# Patient Record
Sex: Female | Born: 1935 | Race: Black or African American | Hispanic: No | Smoking: Former smoker
Health system: Southern US, Community
[De-identification: ages and names within clinical notes are randomized; demographics above are authoritative.]

## PROBLEM LIST (undated history)

## (undated) ENCOUNTER — Emergency Department (HOSPITAL_COMMUNITY): Admission: EM | Payer: Medicare Other | Source: Home / Self Care

## (undated) DIAGNOSIS — M545 Low back pain, unspecified: Secondary | ICD-10-CM

## (undated) DIAGNOSIS — E785 Hyperlipidemia, unspecified: Secondary | ICD-10-CM

## (undated) DIAGNOSIS — K589 Irritable bowel syndrome without diarrhea: Secondary | ICD-10-CM

## (undated) DIAGNOSIS — I2699 Other pulmonary embolism without acute cor pulmonale: Secondary | ICD-10-CM

## (undated) DIAGNOSIS — M1990 Unspecified osteoarthritis, unspecified site: Secondary | ICD-10-CM

## (undated) DIAGNOSIS — M8580 Other specified disorders of bone density and structure, unspecified site: Secondary | ICD-10-CM

## (undated) DIAGNOSIS — I10 Essential (primary) hypertension: Secondary | ICD-10-CM

## (undated) DIAGNOSIS — K219 Gastro-esophageal reflux disease without esophagitis: Secondary | ICD-10-CM

## (undated) DIAGNOSIS — A809 Acute poliomyelitis, unspecified: Secondary | ICD-10-CM

## (undated) HISTORY — DX: Acute poliomyelitis, unspecified: A80.9

## (undated) HISTORY — DX: Gastro-esophageal reflux disease without esophagitis: K21.9

## (undated) HISTORY — PX: JOINT REPLACEMENT: SHX530

## (undated) HISTORY — DX: Hyperlipidemia, unspecified: E78.5

## (undated) HISTORY — DX: Other pulmonary embolism without acute cor pulmonale: I26.99

## (undated) HISTORY — DX: Low back pain, unspecified: M54.50

## (undated) HISTORY — DX: Other specified disorders of bone density and structure, unspecified site: M85.80

## (undated) HISTORY — DX: Unspecified osteoarthritis, unspecified site: M19.90

## (undated) HISTORY — DX: Low back pain: M54.5

## (undated) HISTORY — DX: Essential (primary) hypertension: I10

---
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
## 1983-01-11 HISTORY — PX: OOPHORECTOMY: SHX86

## 1983-01-11 HISTORY — PX: ABDOMINAL HYSTERECTOMY: SHX81

## 1997-04-23 ENCOUNTER — Encounter: Admission: RE | Admit: 1997-04-23 | Discharge: 1997-04-23 | Payer: Self-pay | Admitting: Internal Medicine

## 1998-01-20 ENCOUNTER — Encounter: Admission: RE | Admit: 1998-01-20 | Discharge: 1998-01-20 | Payer: Self-pay | Admitting: Internal Medicine

## 1998-02-20 ENCOUNTER — Ambulatory Visit (HOSPITAL_COMMUNITY): Admission: RE | Admit: 1998-02-20 | Discharge: 1998-02-20 | Payer: Self-pay | Admitting: *Deleted

## 1998-06-10 ENCOUNTER — Encounter: Admission: RE | Admit: 1998-06-10 | Discharge: 1998-06-10 | Payer: Self-pay | Admitting: Internal Medicine

## 1999-01-22 ENCOUNTER — Encounter: Admission: RE | Admit: 1999-01-22 | Discharge: 1999-01-22 | Payer: Self-pay | Admitting: Internal Medicine

## 1999-02-05 ENCOUNTER — Encounter: Admission: RE | Admit: 1999-02-05 | Discharge: 1999-02-05 | Payer: Self-pay | Admitting: Internal Medicine

## 1999-02-12 ENCOUNTER — Encounter: Admission: RE | Admit: 1999-02-12 | Discharge: 1999-02-12 | Payer: Self-pay | Admitting: Internal Medicine

## 1999-02-19 ENCOUNTER — Encounter: Admission: RE | Admit: 1999-02-19 | Discharge: 1999-02-19 | Payer: Self-pay | Admitting: Internal Medicine

## 1999-02-26 ENCOUNTER — Encounter: Admission: RE | Admit: 1999-02-26 | Discharge: 1999-02-26 | Payer: Self-pay | Admitting: Internal Medicine

## 1999-03-18 ENCOUNTER — Encounter: Admission: RE | Admit: 1999-03-18 | Discharge: 1999-03-18 | Payer: Self-pay | Admitting: Internal Medicine

## 1999-03-23 ENCOUNTER — Ambulatory Visit (HOSPITAL_COMMUNITY): Admission: RE | Admit: 1999-03-23 | Discharge: 1999-03-23 | Payer: Self-pay

## 1999-04-02 ENCOUNTER — Ambulatory Visit (HOSPITAL_COMMUNITY): Admission: RE | Admit: 1999-04-02 | Discharge: 1999-04-02 | Payer: Self-pay

## 1999-04-02 ENCOUNTER — Encounter: Payer: Self-pay | Admitting: Internal Medicine

## 1999-06-18 ENCOUNTER — Encounter: Admission: RE | Admit: 1999-06-18 | Discharge: 1999-06-18 | Payer: Self-pay | Admitting: Internal Medicine

## 1999-08-18 ENCOUNTER — Encounter: Admission: RE | Admit: 1999-08-18 | Discharge: 1999-08-18 | Payer: Self-pay | Admitting: Internal Medicine

## 1999-08-19 ENCOUNTER — Ambulatory Visit (HOSPITAL_COMMUNITY): Admission: RE | Admit: 1999-08-19 | Discharge: 1999-08-19 | Payer: Self-pay | Admitting: Internal Medicine

## 1999-08-19 ENCOUNTER — Encounter: Payer: Self-pay | Admitting: Internal Medicine

## 1999-08-31 ENCOUNTER — Encounter: Admission: RE | Admit: 1999-08-31 | Discharge: 1999-08-31 | Payer: Self-pay | Admitting: Hematology and Oncology

## 1999-12-22 ENCOUNTER — Encounter: Admission: RE | Admit: 1999-12-22 | Discharge: 1999-12-22 | Payer: Self-pay | Admitting: Internal Medicine

## 2000-06-07 ENCOUNTER — Encounter: Admission: RE | Admit: 2000-06-07 | Discharge: 2000-06-07 | Payer: Self-pay | Admitting: Internal Medicine

## 2000-06-11 ENCOUNTER — Ambulatory Visit (HOSPITAL_COMMUNITY): Admission: RE | Admit: 2000-06-11 | Discharge: 2000-06-11 | Payer: Self-pay | Admitting: Internal Medicine

## 2000-06-11 ENCOUNTER — Encounter: Payer: Self-pay | Admitting: Internal Medicine

## 2000-06-19 ENCOUNTER — Ambulatory Visit (HOSPITAL_COMMUNITY): Admission: RE | Admit: 2000-06-19 | Discharge: 2000-06-19 | Payer: Self-pay

## 2000-07-31 ENCOUNTER — Encounter: Admission: RE | Admit: 2000-07-31 | Discharge: 2000-07-31 | Payer: Self-pay | Admitting: Internal Medicine

## 2000-11-24 ENCOUNTER — Encounter: Admission: RE | Admit: 2000-11-24 | Discharge: 2000-11-24 | Payer: Self-pay | Admitting: Internal Medicine

## 2001-01-23 ENCOUNTER — Encounter: Admission: RE | Admit: 2001-01-23 | Discharge: 2001-01-23 | Payer: Self-pay

## 2001-04-13 ENCOUNTER — Encounter: Admission: RE | Admit: 2001-04-13 | Discharge: 2001-04-13 | Payer: Self-pay | Admitting: Internal Medicine

## 2001-04-18 ENCOUNTER — Encounter: Admission: RE | Admit: 2001-04-18 | Discharge: 2001-04-18 | Payer: Self-pay

## 2001-05-15 ENCOUNTER — Encounter: Admission: RE | Admit: 2001-05-15 | Discharge: 2001-05-15 | Payer: Self-pay | Admitting: Internal Medicine

## 2001-06-21 ENCOUNTER — Ambulatory Visit (HOSPITAL_COMMUNITY): Admission: RE | Admit: 2001-06-21 | Discharge: 2001-06-21 | Payer: Self-pay | Admitting: Internal Medicine

## 2001-06-27 ENCOUNTER — Encounter: Admission: RE | Admit: 2001-06-27 | Discharge: 2001-06-27 | Payer: Self-pay | Admitting: Internal Medicine

## 2001-07-02 ENCOUNTER — Encounter: Admission: RE | Admit: 2001-07-02 | Discharge: 2001-07-02 | Payer: Self-pay | Admitting: Obstetrics

## 2001-07-02 ENCOUNTER — Encounter: Payer: Self-pay | Admitting: Obstetrics

## 2001-07-11 ENCOUNTER — Encounter: Admission: RE | Admit: 2001-07-11 | Discharge: 2001-07-11 | Payer: Self-pay | Admitting: Internal Medicine

## 2001-07-18 ENCOUNTER — Encounter: Admission: RE | Admit: 2001-07-18 | Discharge: 2001-07-18 | Payer: Self-pay | Admitting: Internal Medicine

## 2001-08-01 ENCOUNTER — Encounter: Admission: RE | Admit: 2001-08-01 | Discharge: 2001-08-01 | Payer: Self-pay | Admitting: Internal Medicine

## 2001-08-08 ENCOUNTER — Encounter: Admission: RE | Admit: 2001-08-08 | Discharge: 2001-08-08 | Payer: Self-pay | Admitting: Internal Medicine

## 2001-08-22 ENCOUNTER — Encounter: Admission: RE | Admit: 2001-08-22 | Discharge: 2001-08-22 | Payer: Self-pay | Admitting: Internal Medicine

## 2001-11-16 ENCOUNTER — Ambulatory Visit (HOSPITAL_COMMUNITY): Admission: RE | Admit: 2001-11-16 | Discharge: 2001-11-16 | Payer: Self-pay | Admitting: Internal Medicine

## 2001-11-16 ENCOUNTER — Encounter: Admission: RE | Admit: 2001-11-16 | Discharge: 2001-11-16 | Payer: Self-pay | Admitting: Internal Medicine

## 2001-11-19 ENCOUNTER — Encounter: Payer: Self-pay | Admitting: Internal Medicine

## 2001-11-19 ENCOUNTER — Ambulatory Visit (HOSPITAL_COMMUNITY): Admission: RE | Admit: 2001-11-19 | Discharge: 2001-11-19 | Payer: Self-pay | Admitting: Internal Medicine

## 2001-12-21 ENCOUNTER — Encounter: Admission: RE | Admit: 2001-12-21 | Discharge: 2001-12-21 | Payer: Self-pay | Admitting: Internal Medicine

## 2002-02-01 ENCOUNTER — Encounter: Admission: RE | Admit: 2002-02-01 | Discharge: 2002-02-01 | Payer: Self-pay | Admitting: Internal Medicine

## 2002-02-27 ENCOUNTER — Ambulatory Visit (HOSPITAL_COMMUNITY): Admission: RE | Admit: 2002-02-27 | Discharge: 2002-02-27 | Payer: Self-pay | Admitting: Gastroenterology

## 2002-03-11 ENCOUNTER — Encounter: Admission: RE | Admit: 2002-03-11 | Discharge: 2002-03-11 | Payer: Self-pay | Admitting: Internal Medicine

## 2002-03-11 ENCOUNTER — Ambulatory Visit (HOSPITAL_COMMUNITY): Admission: RE | Admit: 2002-03-11 | Discharge: 2002-03-11 | Payer: Self-pay | Admitting: Internal Medicine

## 2002-04-04 ENCOUNTER — Encounter: Admission: RE | Admit: 2002-04-04 | Discharge: 2002-04-04 | Payer: Self-pay | Admitting: Internal Medicine

## 2002-05-02 ENCOUNTER — Encounter: Payer: Self-pay | Admitting: Orthopedic Surgery

## 2002-05-07 ENCOUNTER — Inpatient Hospital Stay (HOSPITAL_COMMUNITY): Admission: RE | Admit: 2002-05-07 | Discharge: 2002-05-13 | Payer: Self-pay | Admitting: Orthopedic Surgery

## 2002-05-07 ENCOUNTER — Encounter: Payer: Self-pay | Admitting: Orthopedic Surgery

## 2002-05-13 ENCOUNTER — Encounter: Payer: Self-pay | Admitting: Internal Medicine

## 2002-05-13 ENCOUNTER — Inpatient Hospital Stay (HOSPITAL_COMMUNITY)
Admission: RE | Admit: 2002-05-13 | Discharge: 2002-05-18 | Payer: Self-pay | Admitting: Physical Medicine & Rehabilitation

## 2002-06-10 ENCOUNTER — Encounter: Admission: RE | Admit: 2002-06-10 | Discharge: 2002-06-10 | Payer: Self-pay | Admitting: Internal Medicine

## 2002-10-01 ENCOUNTER — Encounter: Admission: RE | Admit: 2002-10-01 | Discharge: 2002-10-01 | Payer: Self-pay | Admitting: Internal Medicine

## 2002-10-02 ENCOUNTER — Encounter: Admission: RE | Admit: 2002-10-02 | Discharge: 2002-10-02 | Payer: Self-pay | Admitting: Internal Medicine

## 2002-11-01 ENCOUNTER — Encounter: Admission: RE | Admit: 2002-11-01 | Discharge: 2002-11-01 | Payer: Self-pay | Admitting: Internal Medicine

## 2002-11-06 ENCOUNTER — Encounter: Admission: RE | Admit: 2002-11-06 | Discharge: 2002-11-06 | Payer: Self-pay | Admitting: Internal Medicine

## 2003-01-16 ENCOUNTER — Encounter: Admission: RE | Admit: 2003-01-16 | Discharge: 2003-01-16 | Payer: Self-pay | Admitting: Internal Medicine

## 2003-02-17 ENCOUNTER — Encounter: Admission: RE | Admit: 2003-02-17 | Discharge: 2003-02-17 | Payer: Self-pay | Admitting: Internal Medicine

## 2003-03-09 ENCOUNTER — Ambulatory Visit (HOSPITAL_COMMUNITY): Admission: RE | Admit: 2003-03-09 | Discharge: 2003-03-09 | Payer: Self-pay | Admitting: Orthopedic Surgery

## 2003-03-27 ENCOUNTER — Encounter: Admission: RE | Admit: 2003-03-27 | Discharge: 2003-03-27 | Payer: Self-pay | Admitting: Orthopedic Surgery

## 2003-04-11 ENCOUNTER — Encounter: Admission: RE | Admit: 2003-04-11 | Discharge: 2003-04-11 | Payer: Self-pay | Admitting: Orthopedic Surgery

## 2003-04-21 ENCOUNTER — Encounter: Admission: RE | Admit: 2003-04-21 | Discharge: 2003-04-21 | Payer: Self-pay | Admitting: Internal Medicine

## 2003-04-25 ENCOUNTER — Ambulatory Visit (HOSPITAL_COMMUNITY): Admission: RE | Admit: 2003-04-25 | Discharge: 2003-04-25 | Payer: Self-pay | Admitting: Internal Medicine

## 2003-04-30 ENCOUNTER — Encounter: Admission: RE | Admit: 2003-04-30 | Discharge: 2003-04-30 | Payer: Self-pay | Admitting: Orthopedic Surgery

## 2003-06-11 ENCOUNTER — Encounter (INDEPENDENT_AMBULATORY_CARE_PROVIDER_SITE_OTHER): Payer: Self-pay | Admitting: Internal Medicine

## 2003-07-23 ENCOUNTER — Encounter: Admission: RE | Admit: 2003-07-23 | Discharge: 2003-07-23 | Payer: Self-pay | Admitting: Internal Medicine

## 2003-08-06 ENCOUNTER — Encounter: Admission: RE | Admit: 2003-08-06 | Discharge: 2003-08-06 | Payer: Self-pay | Admitting: Internal Medicine

## 2003-10-13 ENCOUNTER — Ambulatory Visit: Payer: Self-pay | Admitting: Internal Medicine

## 2003-10-13 ENCOUNTER — Ambulatory Visit (HOSPITAL_COMMUNITY): Admission: RE | Admit: 2003-10-13 | Discharge: 2003-10-13 | Payer: Self-pay | Admitting: Internal Medicine

## 2003-11-25 ENCOUNTER — Ambulatory Visit: Payer: Self-pay | Admitting: Internal Medicine

## 2004-01-27 ENCOUNTER — Encounter: Admission: RE | Admit: 2004-01-27 | Discharge: 2004-01-27 | Payer: Self-pay | Admitting: Internal Medicine

## 2004-02-04 ENCOUNTER — Ambulatory Visit: Payer: Self-pay | Admitting: Internal Medicine

## 2004-02-10 ENCOUNTER — Encounter: Admission: RE | Admit: 2004-02-10 | Discharge: 2004-02-10 | Payer: Self-pay | Admitting: Orthopedic Surgery

## 2004-02-11 ENCOUNTER — Encounter (INDEPENDENT_AMBULATORY_CARE_PROVIDER_SITE_OTHER): Payer: Self-pay | Admitting: Internal Medicine

## 2004-02-16 ENCOUNTER — Ambulatory Visit: Payer: Self-pay | Admitting: Internal Medicine

## 2004-02-18 ENCOUNTER — Ambulatory Visit: Payer: Self-pay | Admitting: Internal Medicine

## 2004-02-19 ENCOUNTER — Inpatient Hospital Stay (HOSPITAL_COMMUNITY): Admission: EM | Admit: 2004-02-19 | Discharge: 2004-02-23 | Payer: Self-pay | Admitting: Emergency Medicine

## 2004-02-19 ENCOUNTER — Ambulatory Visit: Payer: Self-pay | Admitting: Internal Medicine

## 2004-03-01 ENCOUNTER — Ambulatory Visit: Payer: Self-pay | Admitting: Internal Medicine

## 2004-04-14 ENCOUNTER — Ambulatory Visit: Payer: Self-pay | Admitting: Internal Medicine

## 2004-04-26 ENCOUNTER — Ambulatory Visit (HOSPITAL_COMMUNITY): Admission: RE | Admit: 2004-04-26 | Discharge: 2004-04-26 | Payer: Self-pay | Admitting: Internal Medicine

## 2004-04-26 DIAGNOSIS — M899 Disorder of bone, unspecified: Secondary | ICD-10-CM | POA: Insufficient documentation

## 2004-04-26 DIAGNOSIS — M949 Disorder of cartilage, unspecified: Secondary | ICD-10-CM

## 2004-06-22 ENCOUNTER — Ambulatory Visit: Payer: Self-pay | Admitting: Internal Medicine

## 2004-08-03 ENCOUNTER — Ambulatory Visit: Payer: Self-pay | Admitting: Internal Medicine

## 2004-09-20 ENCOUNTER — Ambulatory Visit: Payer: Self-pay | Admitting: Internal Medicine

## 2004-09-22 ENCOUNTER — Ambulatory Visit: Payer: Self-pay | Admitting: Internal Medicine

## 2004-10-04 ENCOUNTER — Ambulatory Visit: Payer: Self-pay | Admitting: Internal Medicine

## 2005-01-13 ENCOUNTER — Ambulatory Visit: Payer: Self-pay | Admitting: Internal Medicine

## 2005-01-18 ENCOUNTER — Ambulatory Visit: Payer: Self-pay | Admitting: Internal Medicine

## 2005-01-20 ENCOUNTER — Encounter: Admission: RE | Admit: 2005-01-20 | Discharge: 2005-01-20 | Payer: Self-pay | Admitting: Orthopedic Surgery

## 2005-02-04 ENCOUNTER — Encounter: Admission: RE | Admit: 2005-02-04 | Discharge: 2005-02-04 | Payer: Self-pay | Admitting: Orthopedic Surgery

## 2005-02-08 ENCOUNTER — Encounter: Admission: RE | Admit: 2005-02-08 | Discharge: 2005-02-08 | Payer: Self-pay | Admitting: Orthopedic Surgery

## 2005-02-22 ENCOUNTER — Encounter: Admission: RE | Admit: 2005-02-22 | Discharge: 2005-02-22 | Payer: Self-pay | Admitting: Orthopedic Surgery

## 2005-04-29 ENCOUNTER — Ambulatory Visit: Payer: Self-pay | Admitting: Hospitalist

## 2005-05-06 ENCOUNTER — Ambulatory Visit (HOSPITAL_COMMUNITY): Admission: RE | Admit: 2005-05-06 | Discharge: 2005-05-06 | Payer: Self-pay | Admitting: Internal Medicine

## 2005-06-10 ENCOUNTER — Encounter: Admission: RE | Admit: 2005-06-10 | Discharge: 2005-06-10 | Payer: Self-pay | Admitting: Orthopedic Surgery

## 2005-07-27 ENCOUNTER — Ambulatory Visit: Payer: Self-pay | Admitting: Internal Medicine

## 2005-09-21 ENCOUNTER — Ambulatory Visit: Payer: Self-pay | Admitting: Internal Medicine

## 2005-10-04 DIAGNOSIS — Z8719 Personal history of other diseases of the digestive system: Secondary | ICD-10-CM

## 2005-10-26 DIAGNOSIS — M159 Polyosteoarthritis, unspecified: Secondary | ICD-10-CM | POA: Insufficient documentation

## 2005-10-26 DIAGNOSIS — I10 Essential (primary) hypertension: Secondary | ICD-10-CM

## 2005-10-26 DIAGNOSIS — Z96659 Presence of unspecified artificial knee joint: Secondary | ICD-10-CM | POA: Insufficient documentation

## 2005-10-26 DIAGNOSIS — M545 Low back pain, unspecified: Secondary | ICD-10-CM | POA: Insufficient documentation

## 2005-10-26 DIAGNOSIS — Z9079 Acquired absence of other genital organ(s): Secondary | ICD-10-CM | POA: Insufficient documentation

## 2005-10-26 DIAGNOSIS — K219 Gastro-esophageal reflux disease without esophagitis: Secondary | ICD-10-CM

## 2005-11-07 ENCOUNTER — Ambulatory Visit: Payer: Self-pay | Admitting: Internal Medicine

## 2005-11-07 ENCOUNTER — Encounter (INDEPENDENT_AMBULATORY_CARE_PROVIDER_SITE_OTHER): Payer: Self-pay | Admitting: Internal Medicine

## 2005-11-07 LAB — CONVERTED CEMR LAB
HDL: 93 mg/dL (ref >39)
Total CHOL/HDL Ratio: 2
VLDL: 16 mg/dL (ref 0–40)

## 2005-11-11 ENCOUNTER — Ambulatory Visit (HOSPITAL_COMMUNITY): Admission: RE | Admit: 2005-11-11 | Discharge: 2005-11-11 | Payer: Self-pay | Admitting: Internal Medicine

## 2005-12-15 DIAGNOSIS — M5106 Intervertebral disc disorders with myelopathy, lumbar region: Secondary | ICD-10-CM

## 2005-12-15 DIAGNOSIS — Z86718 Personal history of other venous thrombosis and embolism: Secondary | ICD-10-CM | POA: Insufficient documentation

## 2005-12-15 DIAGNOSIS — E785 Hyperlipidemia, unspecified: Secondary | ICD-10-CM | POA: Insufficient documentation

## 2006-02-02 ENCOUNTER — Encounter (INDEPENDENT_AMBULATORY_CARE_PROVIDER_SITE_OTHER): Payer: Self-pay | Admitting: Internal Medicine

## 2006-02-02 ENCOUNTER — Ambulatory Visit: Payer: Self-pay | Admitting: Internal Medicine

## 2006-02-02 DIAGNOSIS — J189 Pneumonia, unspecified organism: Secondary | ICD-10-CM

## 2006-02-02 LAB — CONVERTED CEMR LAB
Bilirubin Urine: NEGATIVE
Ketones, ur: NEGATIVE mg/dL
Protein, ur: NEGATIVE mg/dL

## 2006-02-10 ENCOUNTER — Telehealth: Payer: Self-pay | Admitting: *Deleted

## 2006-02-13 ENCOUNTER — Ambulatory Visit (HOSPITAL_COMMUNITY): Admission: RE | Admit: 2006-02-13 | Discharge: 2006-02-13 | Payer: Self-pay | Admitting: Internal Medicine

## 2006-02-21 ENCOUNTER — Telehealth: Payer: Self-pay | Admitting: *Deleted

## 2006-03-10 ENCOUNTER — Ambulatory Visit: Payer: Self-pay | Admitting: Internal Medicine

## 2006-03-10 ENCOUNTER — Encounter (INDEPENDENT_AMBULATORY_CARE_PROVIDER_SITE_OTHER): Payer: Self-pay | Admitting: Unknown Physician Specialty

## 2006-03-10 DIAGNOSIS — R3915 Urgency of urination: Secondary | ICD-10-CM | POA: Insufficient documentation

## 2006-03-20 ENCOUNTER — Telehealth: Payer: Self-pay | Admitting: *Deleted

## 2006-04-20 ENCOUNTER — Ambulatory Visit: Payer: Self-pay | Admitting: Internal Medicine

## 2006-04-20 DIAGNOSIS — R32 Unspecified urinary incontinence: Secondary | ICD-10-CM | POA: Insufficient documentation

## 2006-05-09 ENCOUNTER — Encounter (INDEPENDENT_AMBULATORY_CARE_PROVIDER_SITE_OTHER): Payer: Self-pay | Admitting: Internal Medicine

## 2006-05-09 ENCOUNTER — Ambulatory Visit (HOSPITAL_COMMUNITY): Admission: RE | Admit: 2006-05-09 | Discharge: 2006-05-09 | Payer: Self-pay | Admitting: Gynecology

## 2006-05-29 ENCOUNTER — Ambulatory Visit: Payer: Self-pay | Admitting: Internal Medicine

## 2006-05-29 ENCOUNTER — Encounter (INDEPENDENT_AMBULATORY_CARE_PROVIDER_SITE_OTHER): Payer: Self-pay | Admitting: Internal Medicine

## 2006-05-29 LAB — CONVERTED CEMR LAB
ALT: 11 units/L (ref 0–35)
AST: 14 units/L (ref 0–37)
Albumin: 4.5 g/dL (ref 3.5–5.2)
BUN: 27 mg/dL — ABNORMAL HIGH (ref 6–23)
Basophils Absolute: 0.1 10*3/uL (ref 0.0–0.1)
CO2: 29 meq/L (ref 19–32)
Calcium: 10.5 mg/dL (ref 8.4–10.5)
Chloride: 105 meq/L (ref 96–112)
Cholesterol: 181 mg/dL (ref 0–200)
Creatinine, Ser: 1.06 mg/dL (ref 0.40–1.20)
Eosinophils Relative: 3 % (ref 0–5)
Glucose, Bld: 89 mg/dL (ref 70–99)
HDL: 88 mg/dL (ref >39)
LDL Cholesterol: 78 mg/dL (ref 0–99)
Lymphocytes Relative: 44 % (ref 12–46)
Lymphs Abs: 2.2 10*3/uL (ref 0.7–3.3)
MCV: 99.8 fL (ref 78.0–100.0)
Monocytes Absolute: 0.5 10*3/uL (ref 0.2–0.7)
Monocytes Relative: 10 % (ref 3–11)
Neutro Abs: 2.1 10*3/uL (ref 1.7–7.7)
Neutrophils Relative %: 42 % — ABNORMAL LOW (ref 43–77)
Potassium: 4.2 meq/L (ref 3.5–5.3)
Total Bilirubin: 0.5 mg/dL (ref 0.3–1.2)
Total CHOL/HDL Ratio: 2.1
Total Protein: 7.6 g/dL (ref 6.0–8.3)
Triglycerides: 75 mg/dL (ref <150)

## 2006-06-02 ENCOUNTER — Ambulatory Visit (HOSPITAL_COMMUNITY): Admission: RE | Admit: 2006-06-02 | Discharge: 2006-06-02 | Payer: Self-pay | Admitting: Internal Medicine

## 2006-06-27 ENCOUNTER — Telehealth: Payer: Self-pay | Admitting: *Deleted

## 2006-06-29 ENCOUNTER — Encounter: Payer: Self-pay | Admitting: Internal Medicine

## 2006-08-09 ENCOUNTER — Telehealth (INDEPENDENT_AMBULATORY_CARE_PROVIDER_SITE_OTHER): Payer: Self-pay | Admitting: *Deleted

## 2006-08-15 ENCOUNTER — Encounter (INDEPENDENT_AMBULATORY_CARE_PROVIDER_SITE_OTHER): Payer: Self-pay | Admitting: *Deleted

## 2006-08-15 ENCOUNTER — Ambulatory Visit: Payer: Self-pay | Admitting: *Deleted

## 2006-08-15 DIAGNOSIS — J309 Allergic rhinitis, unspecified: Secondary | ICD-10-CM | POA: Insufficient documentation

## 2006-08-28 ENCOUNTER — Telehealth: Payer: Self-pay | Admitting: *Deleted

## 2006-08-28 ENCOUNTER — Encounter (INDEPENDENT_AMBULATORY_CARE_PROVIDER_SITE_OTHER): Payer: Self-pay | Admitting: Internal Medicine

## 2006-08-28 ENCOUNTER — Telehealth (INDEPENDENT_AMBULATORY_CARE_PROVIDER_SITE_OTHER): Payer: Self-pay | Admitting: *Deleted

## 2006-09-15 ENCOUNTER — Ambulatory Visit: Payer: Self-pay | Admitting: Internal Medicine

## 2006-10-19 ENCOUNTER — Telehealth: Payer: Self-pay | Admitting: *Deleted

## 2006-11-09 ENCOUNTER — Emergency Department (HOSPITAL_COMMUNITY): Admission: EM | Admit: 2006-11-09 | Discharge: 2006-11-09 | Payer: Self-pay | Admitting: Emergency Medicine

## 2006-11-10 ENCOUNTER — Encounter: Payer: Self-pay | Admitting: Emergency Medicine

## 2006-11-11 ENCOUNTER — Inpatient Hospital Stay (HOSPITAL_COMMUNITY): Admission: EM | Admit: 2006-11-11 | Discharge: 2006-11-17 | Payer: Self-pay | Admitting: Internal Medicine

## 2006-11-11 ENCOUNTER — Ambulatory Visit: Payer: Self-pay | Admitting: Internal Medicine

## 2006-11-15 ENCOUNTER — Encounter (INDEPENDENT_AMBULATORY_CARE_PROVIDER_SITE_OTHER): Payer: Self-pay | Admitting: Gastroenterology

## 2006-11-17 ENCOUNTER — Telehealth (INDEPENDENT_AMBULATORY_CARE_PROVIDER_SITE_OTHER): Payer: Self-pay | Admitting: Internal Medicine

## 2006-11-27 ENCOUNTER — Ambulatory Visit: Payer: Self-pay | Admitting: *Deleted

## 2006-11-27 ENCOUNTER — Encounter (INDEPENDENT_AMBULATORY_CARE_PROVIDER_SITE_OTHER): Payer: Self-pay | Admitting: Internal Medicine

## 2006-11-27 DIAGNOSIS — K5900 Constipation, unspecified: Secondary | ICD-10-CM | POA: Insufficient documentation

## 2006-11-27 DIAGNOSIS — K5909 Other constipation: Secondary | ICD-10-CM

## 2006-12-04 ENCOUNTER — Ambulatory Visit: Payer: Self-pay | Admitting: Hospitalist

## 2006-12-04 ENCOUNTER — Encounter (INDEPENDENT_AMBULATORY_CARE_PROVIDER_SITE_OTHER): Payer: Self-pay | Admitting: *Deleted

## 2006-12-04 DIAGNOSIS — L0291 Cutaneous abscess, unspecified: Secondary | ICD-10-CM

## 2006-12-04 DIAGNOSIS — L0390 Cellulitis, unspecified: Secondary | ICD-10-CM

## 2006-12-04 LAB — CONVERTED CEMR LAB
CO2: 26 meq/L (ref 19–32)
Glucose, Bld: 91 mg/dL (ref 70–99)
Hemoglobin: 12.5 g/dL (ref 12.0–15.0)
MCV: 96.5 fL (ref 78.0–100.0)
Platelets: 324 10*3/uL (ref 150–400)

## 2006-12-13 ENCOUNTER — Emergency Department (HOSPITAL_COMMUNITY): Admission: EM | Admit: 2006-12-13 | Discharge: 2006-12-14 | Payer: Self-pay | Admitting: Emergency Medicine

## 2006-12-20 ENCOUNTER — Ambulatory Visit: Payer: Self-pay | Admitting: Internal Medicine

## 2006-12-27 ENCOUNTER — Encounter: Admission: RE | Admit: 2006-12-27 | Discharge: 2006-12-27 | Payer: Self-pay | Admitting: General Surgery

## 2007-01-08 ENCOUNTER — Telehealth: Payer: Self-pay | Admitting: Internal Medicine

## 2007-01-17 ENCOUNTER — Ambulatory Visit: Payer: Self-pay | Admitting: Internal Medicine

## 2007-01-17 DIAGNOSIS — K6289 Other specified diseases of anus and rectum: Secondary | ICD-10-CM

## 2007-01-17 DIAGNOSIS — K208 Other esophagitis: Secondary | ICD-10-CM

## 2007-01-17 DIAGNOSIS — K5730 Diverticulosis of large intestine without perforation or abscess without bleeding: Secondary | ICD-10-CM | POA: Insufficient documentation

## 2007-02-09 ENCOUNTER — Encounter (INDEPENDENT_AMBULATORY_CARE_PROVIDER_SITE_OTHER): Payer: Self-pay | Admitting: Internal Medicine

## 2007-02-12 ENCOUNTER — Telehealth: Payer: Self-pay | Admitting: Internal Medicine

## 2007-02-13 ENCOUNTER — Encounter (INDEPENDENT_AMBULATORY_CARE_PROVIDER_SITE_OTHER): Payer: Self-pay | Admitting: Internal Medicine

## 2007-02-19 ENCOUNTER — Telehealth (INDEPENDENT_AMBULATORY_CARE_PROVIDER_SITE_OTHER): Payer: Self-pay | Admitting: Internal Medicine

## 2007-03-02 ENCOUNTER — Telehealth: Payer: Self-pay | Admitting: *Deleted

## 2007-03-29 ENCOUNTER — Ambulatory Visit: Payer: Self-pay | Admitting: Internal Medicine

## 2007-03-29 ENCOUNTER — Ambulatory Visit (HOSPITAL_COMMUNITY): Admission: RE | Admit: 2007-03-29 | Discharge: 2007-03-29 | Payer: Self-pay | Admitting: Infectious Diseases

## 2007-03-29 ENCOUNTER — Encounter: Payer: Self-pay | Admitting: Internal Medicine

## 2007-03-29 ENCOUNTER — Encounter: Payer: Self-pay | Admitting: Infectious Diseases

## 2007-03-29 DIAGNOSIS — R1084 Generalized abdominal pain: Secondary | ICD-10-CM

## 2007-03-29 LAB — CONVERTED CEMR LAB
Albumin: 3.9 g/dL (ref 3.5–5.2)
Alkaline Phosphatase: 94 units/L (ref 39–117)
BUN: 13 mg/dL (ref 6–23)
Calcium: 10.5 mg/dL (ref 8.4–10.5)
Creatinine, Ser: 0.89 mg/dL (ref 0.40–1.20)
Lipase: 33 units/L (ref 11–59)
Monocytes Relative: 6 % (ref 3–12)
Total Bilirubin: 0.8 mg/dL (ref 0.3–1.2)
Total Protein: 7 g/dL (ref 6.0–8.3)

## 2007-04-10 ENCOUNTER — Ambulatory Visit: Payer: Self-pay | Admitting: Internal Medicine

## 2007-04-10 ENCOUNTER — Encounter: Payer: Self-pay | Admitting: Internal Medicine

## 2007-04-10 LAB — CONVERTED CEMR LAB: PTH: 100.1 pg/mL — ABNORMAL HIGH (ref 14.0–72.0)

## 2007-04-12 ENCOUNTER — Encounter: Payer: Self-pay | Admitting: *Deleted

## 2007-04-24 ENCOUNTER — Encounter (INDEPENDENT_AMBULATORY_CARE_PROVIDER_SITE_OTHER): Payer: Self-pay | Admitting: Internal Medicine

## 2007-04-30 ENCOUNTER — Ambulatory Visit: Payer: Self-pay | Admitting: Infectious Disease

## 2007-04-30 ENCOUNTER — Telehealth (INDEPENDENT_AMBULATORY_CARE_PROVIDER_SITE_OTHER): Payer: Self-pay | Admitting: Internal Medicine

## 2007-04-30 ENCOUNTER — Encounter: Payer: Self-pay | Admitting: Internal Medicine

## 2007-04-30 LAB — CONVERTED CEMR LAB
Cholesterol: 165 mg/dL (ref 0–200)
Collection Interval-CRCL: 24 hr
Creatinine 24 HR UR: 794 mg/24hr (ref 700–1800)
Creatinine Clearance: 56 mL/min — ABNORMAL LOW (ref 75–115)
Creatinine, Urine: 81.4 mg/dL
HDL: 83 mg/dL (ref >39)
LDL Cholesterol: 66 mg/dL (ref 0–99)
Triglycerides: 81 mg/dL (ref <150)
VLDL: 16 mg/dL (ref 0–40)

## 2007-05-01 ENCOUNTER — Ambulatory Visit (HOSPITAL_COMMUNITY): Admission: RE | Admit: 2007-05-01 | Discharge: 2007-05-01 | Payer: Self-pay | Admitting: Internal Medicine

## 2007-05-01 ENCOUNTER — Encounter (INDEPENDENT_AMBULATORY_CARE_PROVIDER_SITE_OTHER): Payer: Self-pay | Admitting: Internal Medicine

## 2007-05-02 ENCOUNTER — Encounter (INDEPENDENT_AMBULATORY_CARE_PROVIDER_SITE_OTHER): Payer: Self-pay | Admitting: Hospitalist

## 2007-05-10 ENCOUNTER — Ambulatory Visit (HOSPITAL_COMMUNITY): Admission: RE | Admit: 2007-05-10 | Discharge: 2007-05-10 | Payer: Self-pay | Admitting: Internal Medicine

## 2007-05-25 ENCOUNTER — Ambulatory Visit: Payer: Self-pay | Admitting: Infectious Disease

## 2007-06-07 ENCOUNTER — Telehealth (INDEPENDENT_AMBULATORY_CARE_PROVIDER_SITE_OTHER): Payer: Self-pay | Admitting: Internal Medicine

## 2007-06-07 ENCOUNTER — Encounter (INDEPENDENT_AMBULATORY_CARE_PROVIDER_SITE_OTHER): Payer: Self-pay | Admitting: Internal Medicine

## 2007-07-20 ENCOUNTER — Telehealth: Payer: Self-pay | Admitting: Infectious Diseases

## 2007-07-24 ENCOUNTER — Encounter: Payer: Self-pay | Admitting: Infectious Diseases

## 2007-09-07 ENCOUNTER — Telehealth (INDEPENDENT_AMBULATORY_CARE_PROVIDER_SITE_OTHER): Payer: Self-pay | Admitting: Internal Medicine

## 2007-09-12 ENCOUNTER — Encounter (INDEPENDENT_AMBULATORY_CARE_PROVIDER_SITE_OTHER): Payer: Self-pay | Admitting: Internal Medicine

## 2007-09-19 ENCOUNTER — Encounter (INDEPENDENT_AMBULATORY_CARE_PROVIDER_SITE_OTHER): Payer: Self-pay | Admitting: Internal Medicine

## 2007-09-19 ENCOUNTER — Ambulatory Visit: Payer: Self-pay | Admitting: *Deleted

## 2007-10-24 ENCOUNTER — Telehealth (INDEPENDENT_AMBULATORY_CARE_PROVIDER_SITE_OTHER): Payer: Self-pay | Admitting: Internal Medicine

## 2007-10-24 ENCOUNTER — Encounter (INDEPENDENT_AMBULATORY_CARE_PROVIDER_SITE_OTHER): Payer: Self-pay | Admitting: Internal Medicine

## 2007-11-19 ENCOUNTER — Encounter (INDEPENDENT_AMBULATORY_CARE_PROVIDER_SITE_OTHER): Payer: Self-pay | Admitting: Internal Medicine

## 2007-11-23 ENCOUNTER — Encounter (INDEPENDENT_AMBULATORY_CARE_PROVIDER_SITE_OTHER): Payer: Self-pay | Admitting: Internal Medicine

## 2007-11-23 ENCOUNTER — Ambulatory Visit: Payer: Self-pay | Admitting: *Deleted

## 2007-11-23 ENCOUNTER — Ambulatory Visit (HOSPITAL_COMMUNITY): Admission: RE | Admit: 2007-11-23 | Discharge: 2007-11-23 | Payer: Self-pay | Admitting: *Deleted

## 2007-11-23 DIAGNOSIS — M79609 Pain in unspecified limb: Secondary | ICD-10-CM

## 2007-11-23 DIAGNOSIS — R079 Chest pain, unspecified: Secondary | ICD-10-CM

## 2007-11-23 LAB — CONVERTED CEMR LAB
AST: 14 units/L (ref 0–37)
BUN: 18 mg/dL (ref 6–23)
CO2: 23 meq/L (ref 19–32)
Glucose, Bld: 94 mg/dL (ref 70–99)
HCT: 39.6 % (ref 36.0–46.0)
Hemoglobin: 12.9 g/dL (ref 12.0–15.0)
MCV: 99 fL (ref 78.0–100.0)
Potassium: 3.9 meq/L (ref 3.5–5.3)
RBC: 4 M/uL (ref 3.87–5.11)
WBC: 5.2 10*3/uL (ref 4.0–10.5)

## 2007-11-26 ENCOUNTER — Ambulatory Visit (HOSPITAL_COMMUNITY): Admission: RE | Admit: 2007-11-26 | Discharge: 2007-11-26 | Payer: Self-pay | Admitting: Internal Medicine

## 2007-11-26 ENCOUNTER — Encounter (INDEPENDENT_AMBULATORY_CARE_PROVIDER_SITE_OTHER): Payer: Self-pay | Admitting: Internal Medicine

## 2007-11-26 ENCOUNTER — Ambulatory Visit: Payer: Self-pay | Admitting: *Deleted

## 2007-11-29 ENCOUNTER — Encounter (INDEPENDENT_AMBULATORY_CARE_PROVIDER_SITE_OTHER): Payer: Self-pay | Admitting: Internal Medicine

## 2007-11-29 ENCOUNTER — Encounter: Payer: Self-pay | Admitting: Internal Medicine

## 2007-11-29 ENCOUNTER — Ambulatory Visit: Payer: Self-pay

## 2008-01-07 ENCOUNTER — Telehealth: Payer: Self-pay | Admitting: Infectious Diseases

## 2008-01-08 ENCOUNTER — Encounter: Payer: Self-pay | Admitting: Infectious Diseases

## 2008-01-18 ENCOUNTER — Ambulatory Visit: Payer: Self-pay | Admitting: Internal Medicine

## 2008-01-28 ENCOUNTER — Encounter (INDEPENDENT_AMBULATORY_CARE_PROVIDER_SITE_OTHER): Payer: Self-pay | Admitting: Internal Medicine

## 2008-03-20 ENCOUNTER — Ambulatory Visit: Payer: Self-pay | Admitting: Internal Medicine

## 2008-03-20 ENCOUNTER — Encounter (INDEPENDENT_AMBULATORY_CARE_PROVIDER_SITE_OTHER): Payer: Self-pay | Admitting: Internal Medicine

## 2008-03-20 DIAGNOSIS — H353190 Nonexudative age-related macular degeneration, unspecified eye, stage unspecified: Secondary | ICD-10-CM

## 2008-03-20 DIAGNOSIS — H25049 Posterior subcapsular polar age-related cataract, unspecified eye: Secondary | ICD-10-CM

## 2008-03-20 LAB — CONVERTED CEMR LAB: HDL goal, serum: 40 mg/dL

## 2008-03-21 ENCOUNTER — Ambulatory Visit: Payer: Self-pay | Admitting: Internal Medicine

## 2008-03-21 ENCOUNTER — Encounter (INDEPENDENT_AMBULATORY_CARE_PROVIDER_SITE_OTHER): Payer: Self-pay | Admitting: Internal Medicine

## 2008-03-21 LAB — CONVERTED CEMR LAB
ALT: 10 units/L (ref 0–35)
Albumin: 4.1 g/dL (ref 3.5–5.2)
Alkaline Phosphatase: 78 units/L (ref 39–117)
Cholesterol: 179 mg/dL (ref 0–200)
Creatinine, Ser: 0.91 mg/dL (ref 0.40–1.20)
Glucose, Bld: 84 mg/dL (ref 70–99)
HCT: 38.4 % (ref 36.0–46.0)
LDL Cholesterol: 79 mg/dL (ref 0–99)
MCV: 96.2 fL (ref 78.0–100.0)
RBC: 3.99 M/uL (ref 3.87–5.11)
Total Bilirubin: 0.4 mg/dL (ref 0.3–1.2)
Triglycerides: 65 mg/dL (ref <150)
VLDL: 13 mg/dL (ref 0–40)

## 2008-05-02 ENCOUNTER — Telehealth (INDEPENDENT_AMBULATORY_CARE_PROVIDER_SITE_OTHER): Payer: Self-pay | Admitting: Internal Medicine

## 2008-05-05 ENCOUNTER — Encounter (INDEPENDENT_AMBULATORY_CARE_PROVIDER_SITE_OTHER): Payer: Self-pay | Admitting: Internal Medicine

## 2008-05-08 ENCOUNTER — Ambulatory Visit (HOSPITAL_COMMUNITY): Admission: RE | Admit: 2008-05-08 | Discharge: 2008-05-08 | Payer: Self-pay | Admitting: Internal Medicine

## 2008-06-17 ENCOUNTER — Ambulatory Visit: Payer: Self-pay | Admitting: Internal Medicine

## 2008-06-17 DIAGNOSIS — F4321 Adjustment disorder with depressed mood: Secondary | ICD-10-CM | POA: Insufficient documentation

## 2008-07-28 ENCOUNTER — Ambulatory Visit: Payer: Self-pay | Admitting: Infectious Diseases

## 2008-07-28 ENCOUNTER — Encounter: Payer: Self-pay | Admitting: Internal Medicine

## 2008-07-28 LAB — CONVERTED CEMR LAB
AST: 16 units/L (ref 0–37)
CO2: 29 meq/L (ref 19–32)
Chloride: 104 meq/L (ref 96–112)
Cholesterol: 185 mg/dL (ref 0–200)
Glucose, Bld: 83 mg/dL (ref 70–99)
HDL: 87 mg/dL (ref >39)
Sodium: 142 meq/L (ref 135–145)
Total Bilirubin: 0.4 mg/dL (ref 0.3–1.2)
Total Protein: 6.8 g/dL (ref 6.0–8.3)
VLDL: 20 mg/dL (ref 0–40)

## 2008-08-19 ENCOUNTER — Telehealth: Payer: Self-pay | Admitting: Internal Medicine

## 2008-09-18 ENCOUNTER — Telehealth: Payer: Self-pay | Admitting: Internal Medicine

## 2008-09-18 ENCOUNTER — Encounter: Payer: Self-pay | Admitting: Internal Medicine

## 2008-10-27 ENCOUNTER — Telehealth: Payer: Self-pay | Admitting: Internal Medicine

## 2008-10-27 ENCOUNTER — Encounter: Payer: Self-pay | Admitting: Internal Medicine

## 2008-11-04 ENCOUNTER — Ambulatory Visit: Payer: Self-pay | Admitting: Internal Medicine

## 2008-11-27 IMAGING — CR DG CHEST 1V PORT
1 series · 1 of 1 positions shown · non-contrast
Comparison: 02/20/2004

CLINICAL DATA: Line placement

PORTABLE CHEST - 1 VIEW:

[view not recorded]
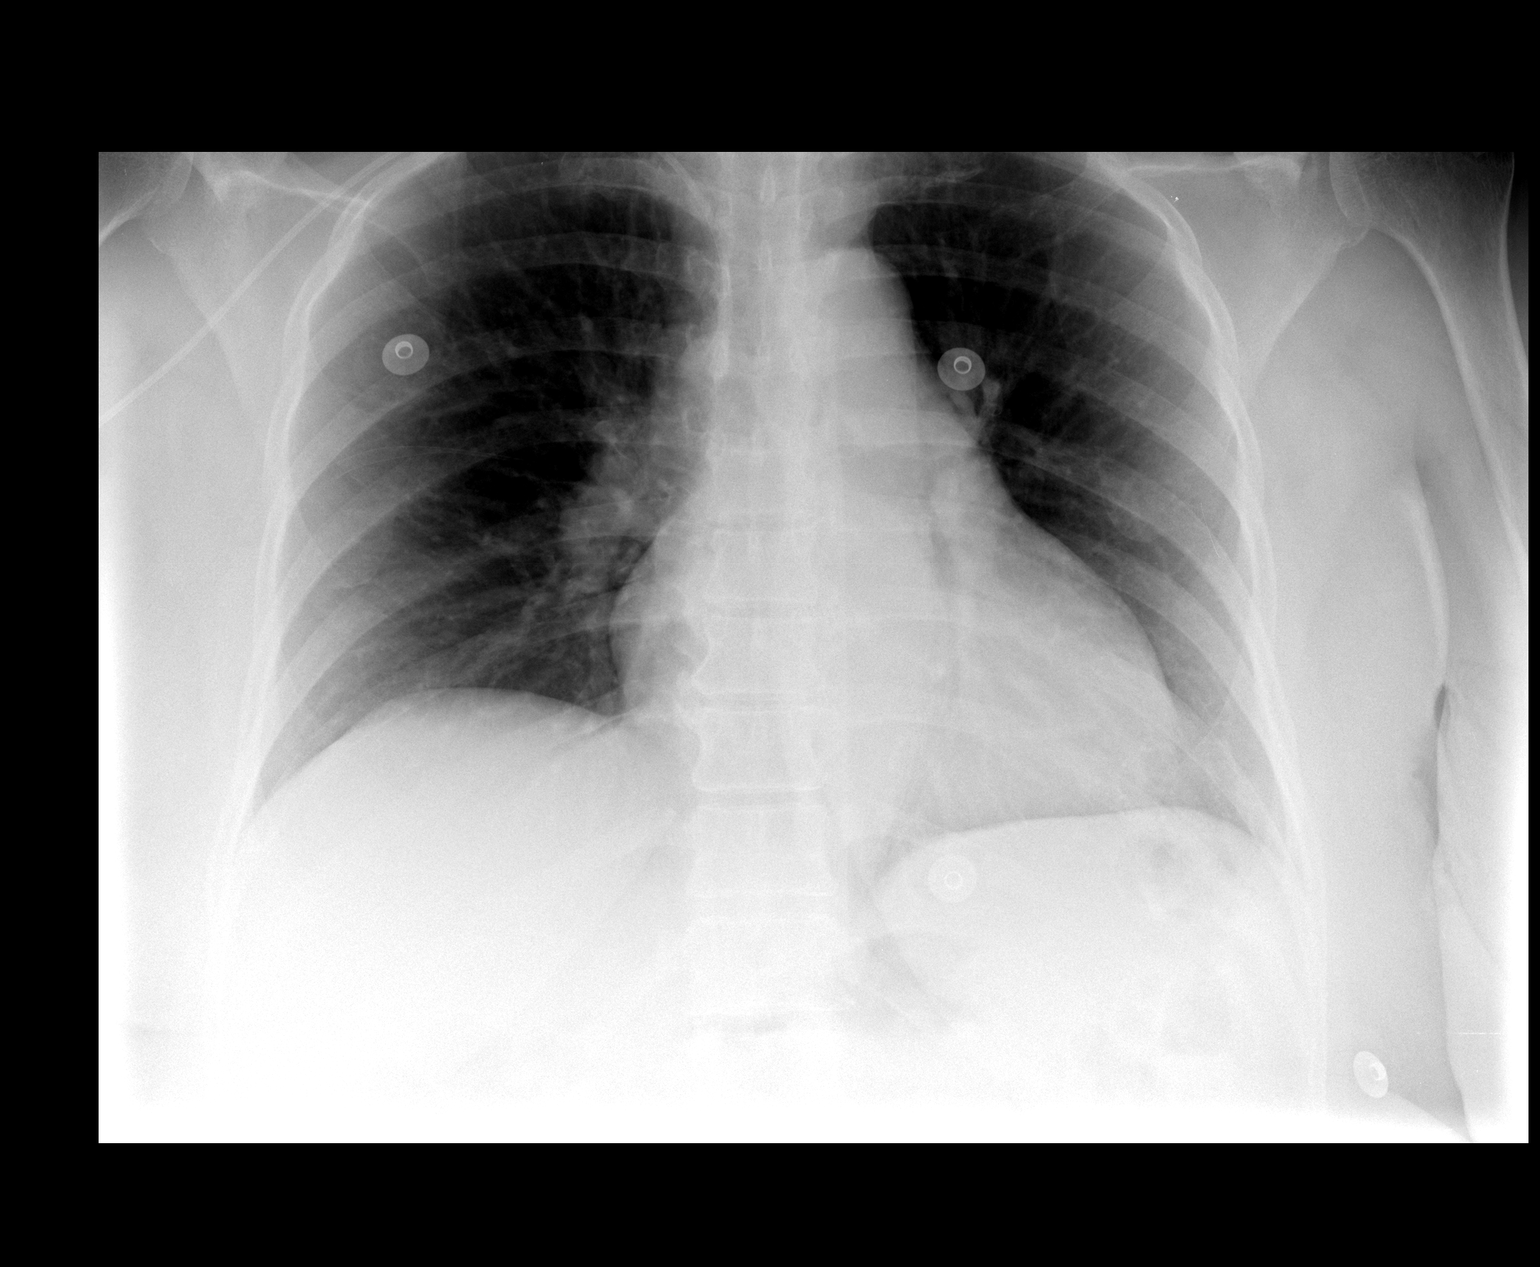

[1 of 1 positions shown; findings below may reference images not displayed]

FINDINGS: Right PICC line is in place. The tip is in the mid SVC.

Decreasing bibasilar opacities since prior study. Heart is borderline in size.
IMPRESSION: Right PICC line tip mid SVC.

## 2008-12-08 ENCOUNTER — Telehealth: Payer: Self-pay | Admitting: *Deleted

## 2008-12-09 ENCOUNTER — Encounter: Payer: Self-pay | Admitting: Internal Medicine

## 2009-02-02 ENCOUNTER — Telehealth: Payer: Self-pay | Admitting: Internal Medicine

## 2009-02-06 ENCOUNTER — Encounter: Payer: Self-pay | Admitting: Internal Medicine

## 2009-02-09 ENCOUNTER — Ambulatory Visit: Payer: Self-pay | Admitting: Internal Medicine

## 2009-03-10 ENCOUNTER — Telehealth: Payer: Self-pay | Admitting: Internal Medicine

## 2009-03-11 ENCOUNTER — Encounter: Payer: Self-pay | Admitting: Infectious Diseases

## 2009-04-14 ENCOUNTER — Telehealth: Payer: Self-pay | Admitting: Internal Medicine

## 2009-04-17 ENCOUNTER — Encounter: Payer: Self-pay | Admitting: Internal Medicine

## 2009-05-11 ENCOUNTER — Ambulatory Visit: Payer: Self-pay | Admitting: Internal Medicine

## 2009-05-11 LAB — CONVERTED CEMR LAB
HDL: 91 mg/dL (ref >39)
LDL Cholesterol: 66 mg/dL (ref 0–99)
VLDL: 27 mg/dL (ref 0–40)

## 2009-05-22 ENCOUNTER — Encounter: Payer: Self-pay | Admitting: Internal Medicine

## 2009-05-22 ENCOUNTER — Telehealth: Payer: Self-pay | Admitting: Internal Medicine

## 2009-05-25 ENCOUNTER — Telehealth: Payer: Self-pay | Admitting: *Deleted

## 2009-05-26 ENCOUNTER — Encounter: Payer: Self-pay | Admitting: Internal Medicine

## 2009-05-26 ENCOUNTER — Telehealth: Payer: Self-pay | Admitting: *Deleted

## 2009-05-29 ENCOUNTER — Ambulatory Visit (HOSPITAL_COMMUNITY): Admission: RE | Admit: 2009-05-29 | Discharge: 2009-05-29 | Payer: Self-pay | Admitting: Family Medicine

## 2009-05-29 LAB — HM MAMMOGRAPHY: HM Mammogram: NEGATIVE

## 2009-06-25 ENCOUNTER — Encounter: Payer: Self-pay | Admitting: Internal Medicine

## 2009-07-02 ENCOUNTER — Emergency Department (HOSPITAL_COMMUNITY): Admission: EM | Admit: 2009-07-02 | Discharge: 2009-07-02 | Payer: Self-pay | Admitting: Emergency Medicine

## 2009-07-06 ENCOUNTER — Ambulatory Visit: Payer: Self-pay | Admitting: Internal Medicine

## 2009-07-06 DIAGNOSIS — K589 Irritable bowel syndrome without diarrhea: Secondary | ICD-10-CM

## 2009-07-10 ENCOUNTER — Ambulatory Visit (HOSPITAL_COMMUNITY): Admission: RE | Admit: 2009-07-10 | Discharge: 2009-07-10 | Payer: Self-pay | Admitting: Internal Medicine

## 2009-07-10 ENCOUNTER — Ambulatory Visit: Payer: Self-pay | Admitting: Internal Medicine

## 2009-07-10 ENCOUNTER — Telehealth: Payer: Self-pay | Admitting: *Deleted

## 2009-07-10 DIAGNOSIS — R197 Diarrhea, unspecified: Secondary | ICD-10-CM

## 2009-07-10 DIAGNOSIS — E876 Hypokalemia: Secondary | ICD-10-CM

## 2009-07-10 LAB — CONVERTED CEMR LAB
ALT: 19 units/L (ref 0–35)
AST: 20 units/L (ref 0–37)
Albumin: 3.8 g/dL (ref 3.5–5.2)
Alkaline Phosphatase: 82 units/L (ref 39–117)
BUN: 14 mg/dL (ref 6–23)
Creatinine, Ser: 0.98 mg/dL (ref 0.40–1.20)
Glucose, Bld: 92 mg/dL (ref 70–99)
Lipase: 47 units/L (ref 11–59)
Total Bilirubin: 0.6 mg/dL (ref 0.3–1.2)

## 2009-07-11 ENCOUNTER — Telehealth: Payer: Self-pay | Admitting: Internal Medicine

## 2009-07-20 ENCOUNTER — Ambulatory Visit: Payer: Self-pay | Admitting: Internal Medicine

## 2009-07-20 LAB — CONVERTED CEMR LAB
CO2: 23 meq/L (ref 19–32)
Calcium: 10 mg/dL (ref 8.4–10.5)
Sodium: 143 meq/L (ref 135–145)

## 2009-08-11 ENCOUNTER — Encounter (INDEPENDENT_AMBULATORY_CARE_PROVIDER_SITE_OTHER): Payer: Self-pay | Admitting: *Deleted

## 2009-08-13 ENCOUNTER — Ambulatory Visit: Payer: Self-pay | Admitting: Internal Medicine

## 2009-08-27 ENCOUNTER — Ambulatory Visit: Payer: Self-pay | Admitting: Internal Medicine

## 2009-08-27 LAB — HM COLONOSCOPY: HM Colonoscopy: 2004

## 2009-09-10 ENCOUNTER — Telehealth: Payer: Self-pay | Admitting: Internal Medicine

## 2009-10-01 ENCOUNTER — Telehealth: Payer: Self-pay | Admitting: Internal Medicine

## 2009-10-07 ENCOUNTER — Telehealth: Payer: Self-pay | Admitting: Internal Medicine

## 2009-10-09 ENCOUNTER — Ambulatory Visit: Payer: Self-pay | Admitting: Internal Medicine

## 2009-10-11 ENCOUNTER — Emergency Department (HOSPITAL_COMMUNITY): Admission: EM | Admit: 2009-10-11 | Discharge: 2009-10-11 | Payer: Self-pay | Admitting: Emergency Medicine

## 2009-10-16 ENCOUNTER — Telehealth: Payer: Self-pay | Admitting: Internal Medicine

## 2009-10-16 ENCOUNTER — Emergency Department (HOSPITAL_COMMUNITY): Admission: EM | Admit: 2009-10-16 | Discharge: 2009-10-16 | Payer: Self-pay | Admitting: Emergency Medicine

## 2009-10-20 ENCOUNTER — Ambulatory Visit: Payer: Self-pay | Admitting: Internal Medicine

## 2009-10-20 ENCOUNTER — Encounter (HOSPITAL_BASED_OUTPATIENT_CLINIC_OR_DEPARTMENT_OTHER)
Admission: RE | Admit: 2009-10-20 | Discharge: 2010-01-18 | Payer: Self-pay | Source: Home / Self Care | Attending: General Surgery | Admitting: General Surgery

## 2009-10-20 DIAGNOSIS — T25029A Burn of unspecified degree of unspecified foot, initial encounter: Secondary | ICD-10-CM | POA: Insufficient documentation

## 2009-11-02 ENCOUNTER — Encounter: Payer: Self-pay | Admitting: Internal Medicine

## 2009-11-16 ENCOUNTER — Encounter: Payer: Self-pay | Admitting: Internal Medicine

## 2009-11-25 ENCOUNTER — Ambulatory Visit (HOSPITAL_COMMUNITY): Admission: RE | Admit: 2009-11-25 | Discharge: 2009-11-25 | Payer: Self-pay | Admitting: Internal Medicine

## 2009-11-25 ENCOUNTER — Ambulatory Visit: Payer: Self-pay | Admitting: Internal Medicine

## 2009-11-25 ENCOUNTER — Telehealth: Payer: Self-pay | Admitting: Internal Medicine

## 2009-11-26 ENCOUNTER — Encounter: Payer: Self-pay | Admitting: Internal Medicine

## 2009-11-26 LAB — CONVERTED CEMR LAB
AST: 24 units/L (ref 0–37)
BUN: 12 mg/dL (ref 6–23)
Basophils Absolute: 0 10*3/uL (ref 0.0–0.1)
Basophils Relative: 0 % (ref 0–1)
Bilirubin Urine: NEGATIVE
CO2: 24 meq/L (ref 19–32)
Casts: NONE SEEN /lpf
Chloride: 104 meq/L (ref 96–112)
Ketones, ur: NEGATIVE mg/dL
Lipase: 13 units/L (ref 0–75)
MCV: 95.9 fL (ref >=78.0)
Monocytes Absolute: 0.5 10*3/uL (ref 0.1–1.0)
Monocytes Relative: 8 % (ref 3–12)
Neutro Abs: 3.1 10*3/uL (ref 1.7–7.7)
Neutrophils Relative %: 47 % (ref 43–77)
Platelets: 255 10*3/uL (ref 150–400)
RBC: 4.13 M/uL (ref 3.87–5.11)
RDW: 13 % (ref 11.5–15.5)
Specific Gravity, Urine: 1.009 (ref 1.005–1.0)
Total Protein: 7.3 g/dL (ref 6.0–8.3)
WBC: 6.6 10*3/uL (ref 4.0–10.5)

## 2009-12-09 ENCOUNTER — Telehealth: Payer: Self-pay | Admitting: Internal Medicine

## 2009-12-10 ENCOUNTER — Encounter: Payer: Self-pay | Admitting: Internal Medicine

## 2010-01-31 ENCOUNTER — Encounter: Payer: Self-pay | Admitting: Internal Medicine

## 2010-02-09 NOTE — Progress Notes (Signed)
Summary: Arthritis pain  Phone Note Call from Patient   Caller: Patient Call For: Blondell Reveal MD Summary of Call: Call from pt would like to know if she can take Advil for her Arthritis pain.  RTC to pt says that she is having the pain in her legs from her Arthritis.  Has had before. Does not want to take the Morphine that she had been on before.  Wants to know if she can try some OTC Advil for her  pain.  Pt said that she has occassional swelling in her leg when she is up on them for long periods.  Pt said that she has not noteds any reddness or heat in her legs.  Pt said when asked that she has tried using heat.  It helps sometimes but would like to get something that she can take so that she can do her walking without pain. Initial call taken by: Angelina Ok RN,  September 10, 2009 3:23 PM  Follow-up for Phone Call        She can take Ibuprofen 400 mg up to three times a day for her pain. She can combine it with acetomeniphin. She can try heat and or ice.    Creat was 1.0. No h/o GI bleed per chart. No CAD. On PPI.  Follow-up by: Blanch Media MD,  September 10, 2009 4:29 PM  Additional Follow-up for Phone Call Additional follow up Details #1::        RTC to pt informed he that she can take 400 mg of Ibuprofen 3 times a day. Pt was told thT she could use Acetomenophen as well.   Pt was also told that she could apply ice and heat.  Pt voiced an understanding of the plan. Additional Follow-up by: Angelina Ok RN,  September 10, 2009 4:52 PM

## 2010-02-09 NOTE — Assessment & Plan Note (Signed)
Summary: EST-2 WEEK RECHECK/CH   Vital Signs:  Patient profile:   77 year old female Height:      64 inches (162.56 cm) Weight:      191.6 pounds (87.09 kg) BMI:     33.01 Temp:     98.8 degrees F (37.11 degrees C) oral Pulse rate:   72 / minute BP sitting:   142 / 84  (left arm) Cuff size:   regular  Vitals Entered By: Theotis Barrio NT II (July 20, 2009 4:07 PM) CC: FOLLOW UP VISIT / STILL HAVING SOME ABD DISCOMFORT / REQUEST ENDO /  Is Patient Diabetic? No Pain Assessment Patient in pain? yes     Location: abdomen Intensity:       4 Type: burning Onset of pain  SINCE LAST VISIT Nutritional Status BMI of > 30 = obese  Have you ever been in a relationship where you felt threatened, hurt or afraid?No   Does patient need assistance? Functional Status Self care Ambulation Normal Comments FOLLOW UP VISIT / STILL HAVING SOME BURNING IN ABD SINCE LAST OFFICE VISIT   Primary Care Provider:  Blondell Reveal MD  CC:  FOLLOW UP VISIT / STILL HAVING SOME ABD DISCOMFORT / REQUEST ENDO / .  History of Present Illness: 77 year old lady with PMH as mentioned in the EMR comes to the office for a follow up visit. Patient was seen in the [facility] [place] on 07/10/2009 for abdominal pain, nausea, diarrhea and was thought to be secondary to IBS. Patient had full work up including lipase, AXR, CBC, CMP and apparently everything was normal except for a low potassium of 3.3. Patient was given a prescription for KCL and hyosciamine..  1. Patient reports that her AP is "better" and "not bad" and reports that she occasionally gets bad. Patient reports that she hasn't had any nausea, vomiting or diarrhea. Patient reports that she has been doing good ever since she was started on the hyosciamine. She denies any other complaints.  2. Patient is requesting to have colonoscopy.  She denies any other problems.  Preventive Screening-Counseling & Management  Alcohol-Tobacco     Smoking Status: quit     Year Quit:  30 years ago  Caffeine-Diet-Exercise     Does Patient Exercise: yes     Type of exercise: WALKING     Times/week: 2  Problems Prior to Update: 1)  Hypokalemia  (ICD-276.8) 2)  Diarrhea  (ICD-787.91) 3)  Irritable Bowel Syndrome  (ICD-564.1) 4)  Mourning  (ICD-309.0) 5)  Posterior Subcapsular Polar Senile Cataract  (ICD-366.14) 6)  Nonexudative Senile Macular Degeneration Retina  (ICD-362.51) 7)  Chest Pain, Dull  (ICD-786.50) 8)  Leg Pain, Chronic, Left  (ICD-729.5) 9)  Accidental Fall  (ICD-E888.9) 10)  [place] Hypercalcemia  (ICD-275.42) 11)  Abdominal Pain, Generalized  (ICD-789.07) 12)  Rectal Pain  (ICD-569.42) 13)  Diverticulosis, Colon  (ICD-562.10) 14)  Erosive Esophagitis  (ICD-530.19) 15)  Abscess, Skin  (ICD-682.9) 16)  Constipation, Chronic  (ICD-564.09) 17)  Rhinitis, Allergic Nos  (ICD-477.9) 18)  Urinary Incontinence  (ICD-788.30) 19)  Hx of Symptom, Urgency, Urination  (ICD-788.63) 20)  Screening For Mlig Neop, Breast, Nos  (ICD-V76.10) 21)  Pneumonia  (ICD-486) 22)  Family History of Colon Ca 1st Degree Relative <60  (ICD-V16.0) 23)  Knee Replacement, Right, Hx of  (ICD-V43.65) 24)  Hyperlipidemia  (ICD-272.4) 25)  Helicobacter Pylori Gastritis, Hx of  (ICD-V12.79) 26)  Disorder, Lumbar Disc W/myelopathy  (ICD-722.73) 27)  Pulmonary Embolism, Hx of  (  ICD-V12.51) 28)  Oophorectomy, Hx of  (ICD-V45.77) 29)  Hysterectomy, Hx of  (ICD-V45.77) 30)  Osteopenia  (ICD-733.90) 31)  Knee Replacement, Left, Hx of  (ICD-V43.65) 32)  Osteoarthritis, Multiple Joints  (ICD-715.89) 33)  Low Back Pain  (ICD-724.2) 34)  Hypertension  (ICD-401.9) 35)  Gerd  (ICD-530.81)  Medications Prior to Update: 1)  Aspirin 81 Mg Chew (Aspirin) .... Take 1 Tablet By Mouth Once A Day 2)  Hydrochlorothiazide 25 Mg Tabs (Hydrochlorothiazide) .... Take 1 Tablet By Mouth Once A Day 3)  Calcium 1500 Mg Tabs (Calcium Carbonate) .... Take 1 Tablet By Mouth Once A Day 4)  Vitamin D 400 Unit  Tabs (Vitamin D) .... Take 2 Tabs By Mouth Once Daily 5)  Lisinopril 40 Mg Tabs (Lisinopril) .... Take 1 Tablet By Mouth Once A Day 6)  Zocor 20 Mg Tabs (Simvastatin) .... Take 1 Tablet By Mouth Once A Day 7)  Nexium 40 Mg Cpdr (Esomeprazole Magnesium) .... Take 1 Capsule By Mouth Once A Day 8)  Detrol La 4 Mg Cp24 (Tolterodine Tartrate) .... Take 1 Tablet By Mouth Once A Day 9)  Centrum Silver  Tabs (Multiple Vitamins-Minerals) .... Take 1 Tablet By Mouth Once A Day For Vitamins 10)  Dicyclomine Hcl 10 Mg  Caps (Dicyclomine Hcl) .... Take For Abdominal Pain Up To Four Times Daily. 11)  Glucosamine-Chondroitin 1500-1200 Mg/72ml Liqd (Glucosamine-Chondroitin) .... Take 1 Tablet By Mouth Two Times A Day 12)  Morphine Sulfate Cr 30 Mg Xr12h-Tab (Morphine Sulfate) .... Take 1 Tablet By Mouth Two Times A Day 13)  Citalopram Hydrobromide 20 Mg Tabs (Citalopram Hydrobromide) .... Take 1 Tablet By Mouth Once A Day 14)  Promethazine Hcl 25 Mg Tabs (Promethazine Hcl) .Marland Kitchen.. 1 Tab Every 4 Hrs As Needed For Nausea/vomiting 15)  Hyoscyamine Sulfate 0.125 Mg Subl (Hyoscyamine Sulfate) .... One To Two Pills Under Your Tongue Up To 4 Times A Day A Needed 16)  Potassium Chloride Crys Cr 20 Meq Cr-Tabs (Potassium Chloride Crys Cr) .... One A Day  Current Medications (verified): 1)  Aspirin 81 Mg Chew (Aspirin) .... Take 1 Tablet By Mouth Once A Day 2)  Hydrochlorothiazide 25 Mg Tabs (Hydrochlorothiazide) .... Take 1 Tablet By Mouth Once A Day 3)  Calcium 1500 Mg Tabs (Calcium Carbonate) .... Take 1 Tablet By Mouth Once A Day 4)  Vitamin D 400 Unit Tabs (Vitamin D) .... Take 2 Tabs By Mouth Once Daily 5)  Lisinopril 40 Mg Tabs (Lisinopril) .... Take 1 Tablet By Mouth Once A Day 6)  Zocor 20 Mg Tabs (Simvastatin) .... Take 1 Tablet By Mouth Once A Day 7)  Nexium 40 Mg Cpdr (Esomeprazole Magnesium) .... Take 1 Capsule By Mouth Once A Day 8)  Detrol La 4 Mg Cp24 (Tolterodine Tartrate) .... Take 1 Tablet By Mouth Once  A Day 9)  Centrum Silver  Tabs (Multiple Vitamins-Minerals) .... Take 1 Tablet By Mouth Once A Day For Vitamins 10)  Dicyclomine Hcl 10 Mg  Caps (Dicyclomine Hcl) .... Take For Abdominal Pain Up To Four Times Daily. 11)  Glucosamine-Chondroitin 1500-1200 Mg/53ml Liqd (Glucosamine-Chondroitin) .... Take 1 Tablet By Mouth Two Times A Day 12)  Morphine Sulfate Cr 30 Mg Xr12h-Tab (Morphine Sulfate) .... Take 1 Tablet By Mouth Two Times A Day 13)  Citalopram Hydrobromide 20 Mg Tabs (Citalopram Hydrobromide) .... Take 1 Tablet By Mouth Once A Day 14)  Promethazine Hcl 25 Mg Tabs (Promethazine Hcl) .Marland Kitchen.. 1 Tab Every 4 Hrs As Needed For Nausea/vomiting 15)  Hyoscyamine Sulfate 0.125 Mg Subl (Hyoscyamine Sulfate) .... One To Two Pills Under Your Tongue Up To 4 Times A Day A Needed 16)  Potassium Chloride Crys Cr 20 Meq Cr-Tabs (Potassium Chloride Crys Cr) .... 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 GERD-Papulous Gastropathy EGD Oct 07 Hypertension Low back pain-intermittent radiculopathy Osteoarthritis Pulmonary embolism, hx of-2/2 Knee Replacement Osteopenia (T Score -1.1 R Femur, -0.4 L Spine) Hyperlipidemia Polio in 1942 Urinary Incontinence  Past Surgical History: Reviewed history from 10/26/2005 and no changes required. Total knee replacement-Left 1995, Right 2004 Hysterectomy-1985 Oophorectomy-1985  Family History: Reviewed history from 02/02/2006 and no changes required. Family History  of Colon CA 1st degree relative <60-Daughter died  Social History: Reviewed history from 03/20/2008 and no changes required. 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   Head:  normocephalic and atraumatic.   Neck:  supple and full ROM.   Lungs:  normal respiratory effort, no intercostal retractions, no accessory muscle use, and normal breath sounds.   Heart:  normal rate, regular rhythm, no murmur, and no gallop.   Abdomen:  soft, non-tender, normal bowel sounds, and no distention.   Msk:  normal ROM and no joint tenderness.   Pulses:  R radial normal.   Extremities:  No clubbing, cyanosis, edema, or deformity noted with normal full range of motion of all joints.   Neurologic:  alert & oriented X3, cranial nerves II-XII intact, strength normal in all extremities, and sensation intact to light touch.     Impression & Recommendations:  Problem # 1:  HYPOKALEMIA (ICD-276.8)  Recommended to stop it for now. Will check BMP.  Orders: T-Basic Metabolic Panel [phone])  Problem # 2:  IRRITABLE BOWEL SYNDROME (ICD-564.1) No active issues. Recommended to take Hyosciamine as needed.  Problem # 3:  FAMILY HISTORY OF COLON CA 1ST DEGREE RELATIVE <60 (ICD-V16.0)  Will refer her to screening colonoscopy.  Orders: Gastroenterology Referral (GI)  Problem # 4:  HYPERTENSION (ICD-401.9) Well controlled. Continue current medications.  Her updated medication list for this problem includes:    Hydrochlorothiazide 25 Mg Tabs (Hydrochlorothiazide) .Marland Kitchen... Take 1 tablet by mouth once a day    Lisinopril 40 Mg Tabs (Lisinopril) .Marland Kitchen... Take 1 tablet by mouth once a day  BP today: 142/84 Prior BP: 132/76 (07/10/2009)  Prior 10 Yr Risk Heart Disease: 3 % (03/20/2008)  Labs Reviewed: K+:  3.3 (07/10/2009) Creat: : 0.98 (07/10/2009)   Chol: 184 (05/11/2009)   HDL: 91 (05/11/2009)   LDL: 66 (05/11/2009)   TG: 136 (05/11/2009)  Complete Medication List: 1)  Aspirin 81 Mg Chew (Aspirin) .... Take 1 tablet by mouth once a day 2)  Hydrochlorothiazide 25 Mg Tabs (Hydrochlorothiazide) .... Take 1 tablet by mouth once a day 3)  Calcium 1500 Mg Tabs (Calcium carbonate) .... Take 1 tablet by mouth once a day 4)  Vitamin  D 400 Unit Tabs (Vitamin d) .... Take 2 tabs by mouth once daily 5)  Lisinopril 40 Mg Tabs (Lisinopril) .... Take 1 tablet by mouth once a day 6)  Zocor 20 Mg Tabs (Simvastatin) .... Take 1 tablet by mouth once a day 7)  Nexium 40 Mg Cpdr (Esomeprazole magnesium) .... Take 1 capsule by mouth once a day 8)  Detrol La 4 Mg Cp24 (Tolterodine tartrate) .... Take 1 tablet by mouth once a day 9)  Centrum Silver Tabs (Multiple vitamins-minerals) .... Take 1 tablet by mouth once a day for vitamins 10)  Dicyclomine Hcl 10 Mg Caps (Dicyclomine hcl) .... Take for abdominal pain up to four times daily. 11)  Glucosamine-chondroitin 1500-1200 Mg/32ml Liqd (Glucosamine-chondroitin) .... Take 1 tablet by mouth two times a day 12)  Morphine Sulfate Cr 30 Mg Xr12h-tab (Morphine sulfate) .... Take 1 tablet by mouth two times a day 13)  Citalopram Hydrobromide 20 Mg Tabs (Citalopram hydrobromide) .... Take 1 tablet by mouth once a day 14)  Promethazine Hcl 25 Mg Tabs (Promethazine hcl) .Marland Kitchen.. 1 tab every 4 hrs as needed for nausea/vomiting 15)  Hyoscyamine Sulfate 0.125 Mg Subl (Hyoscyamine sulfate) .... One to two pills under your tongue up to 4 times a day a needed 16)  Potassium Chloride Crys Cr 20 Meq Cr-tabs (Potassium chloride crys cr) .... One a day  Patient Instructions: 1)  Please schedule a follow-up appointment in 3 months. 2)  Do not take Potassium Tablets until we ask you to take. Process Orders Check Orders Results:     [facility]: Check successful Tests Sent  for requisitioning (July 20, 2009 4:39 PM):     07/20/2009: [facility] -- T-Basic Metabolic Panel [phone] (signed)    Prevention & Chronic Care Immunizations   Influenza vaccine: already had '08  (12/04/2006)   Influenza vaccine deferral: Not indicated  (11/04/2008)   Influenza vaccine due: 09/10/2008    Tetanus booster: Not documented   Td booster deferral: Refused  (07/20/2009)    Pneumococcal vaccine: Not documented   Pneumococcal vaccine deferral: Refused  (07/20/2009)    H. zoster vaccine: Not documented   H. zoster vaccine deferral: Refused  (07/20/2009)  Colorectal Screening   Hemoccult: Not documented   Hemoccult action/deferral: Not indicated  (07/20/2009)    Colonoscopy: Normal  (02/27/2002)   Colonoscopy action/deferral: GI referral  (07/20/2009)  Other Screening   Pap smear: Not documented   Pap smear action/deferral: Not indicated S/P hysterectomy  (11/04/2008)    Mammogram: ASSESSMENT: Negative - BI-RADS 1^MM DIGITAL SCREENING  (05/29/2009)   Mammogram action/deferral: Not indicated  (07/20/2009)    DXA bone density scan: Lumbar Spine:  T Score > -1.0 Spine.  Hip Total: T Score -2.5 to -1.0 Hip.   Osteopenia    (05/01/2007)   DXA bone density action/deferral: Deferred  (07/06/2009)   DXA scan due: 05/2009    Smoking status: quit  (07/20/2009)  Lipids   Total Cholesterol: 184  (05/11/2009)   Lipid panel action/deferral: Lipid Panel ordered   LDL: 66  (05/11/2009)   LDL Direct: Not documented   HDL: 91  (05/11/2009)   Triglycerides: 136  (05/11/2009)    SGOT (AST): 20  (07/10/2009)   SGPT (ALT): 19  (07/10/2009)   Alkaline phosphatase: 82  (07/10/2009)   Total bilirubin: 0.6  (07/10/2009)  Hypertension   Last Blood Pressure: 142 / 84  (07/20/2009)   Serum creatinine: 0.98  (07/10/2009)   Serum potassium 3.3  (07/10/2009)  Self-Management Support :   Personal Goals (by the next clinic visit) :      Personal blood  pressure goal: 140/90  (05/11/2009)     Personal LDL goal: 100  (05/11/2009)    Patient will work on the following items until the next clinic visit to reach self-care goals:     Medications and monitoring: take my medicines every day, bring all of my medications to every visit  (07/20/2009)     Eating: drink diet soda or water instead of juice or soda, eat more vegetables, use fresh or frozen vegetables, eat foods that are low in salt, eat baked foods instead of fried foods, eat fruit for snacks and desserts, limit or avoid alcohol  (07/20/2009)     Activity: take a 30 minute walk every day  (07/20/2009)    Hypertension self-management support: Resources for patients handout  (07/20/2009)    Lipid self-management support: Resources for patients handout  (07/20/2009)     Self-management comments: HAVEN'T WALKED LATELY DUE TO SICKNESS      Resource handout printed.

## 2010-02-09 NOTE — Progress Notes (Signed)
Summary: phone/gg  Phone Note Call from Patient   Caller: Patient Summary of Call: Pt called with c/o abd pain, onset last night.  Pain is constant.  Normal BM today, eating okay.  Rates pain 9/10. Hx of IBS and it feels like a flare up to pt.  Was on  Hyoscyamine Sulfate 0.125 Mg Subl but that was stopped.  Will see today at 1430   Initial call taken by: Merrie Roof RN,  November 25, 2009 10:57 AM

## 2010-02-09 NOTE — Assessment & Plan Note (Signed)
Summary: ACUTE-ER/FU PER DR BOGGALA SEE EMR NOTE/CFB(BOGGALA)   Vital Signs:  Patient profile:   77 year old female Height:      64 inches (162.56 cm) Weight:      192.8 pounds (87.64 kg) BMI:     33.21 Temp:     97.7 degrees F (36.50 degrees C) oral Pulse rate:   65 / minute BP sitting:   137 / 75  (right arm)  Vitals Entered By: Stanton Kidney Ditzler RN (July 06, 2009 2:47 PM) Is Patient Diabetic? No Pain Assessment Patient in pain? yes     Location: abdomen Intensity: 8 Type: sharp Onset of pain  about 8 days Nutritional Status BMI of > 30 = obese Nutritional Status Detail appetite down  Have you ever been in a relationship where you felt threatened, hurt or afraid?denies   Does patient need assistance? Functional Status Self care Ambulation Normal Comments ER FU- no change. Still has nausea and rectum burns - has loose yellow BM. Freq urination. Refill on pain med.   Primary Care Provider:  Blondell Reveal MD   History of Present Illness: 77 y/o woman with PMh of HTN, GERD, prior h/o of abd pain and rectal pain comes to the clinic complaining of epigastric abdominial pain worse with eating, better with lyign dow, cramping, episodic,  relieved by percocet provided in the ED. She had this complaints in 2009 and she remembers she feels her pain is similar to what she had in 2009. She took dicylomine at that time and it helped. She visited ED for this pain 0n 07/02/2009 and had USG abd. XRa, basic labs which were all normal. She was discharged on antiemetic and percocet.   Depression History:      The patient denies a depressed mood most of the day and a diminished interest in her usual daily activities.         Preventive Screening-Counseling & Management  Alcohol-Tobacco     Smoking Status: quit     Year Quit: 30 years ago  Caffeine-Diet-Exercise     Does Patient Exercise: yes     Type of exercise: WALKING     Times/week: 2  Current Medications (verified): 1)  Aspirin 81  Mg Chew (Aspirin) .... Take 1 Tablet By Mouth Once A Day 2)  Hydrochlorothiazide 25 Mg Tabs (Hydrochlorothiazide) .... Take 1 Tablet By Mouth Once A Day 3)  Calcium 1500 Mg Tabs (Calcium Carbonate) .... Take 1 Tablet By Mouth Once A Day 4)  Vitamin D 400 Unit Tabs (Vitamin D) .... Take 2 Tabs By Mouth Once Daily 5)  Lisinopril 40 Mg Tabs (Lisinopril) .... Take 1 Tablet By Mouth Once A Day 6)  Zocor 20 Mg Tabs (Simvastatin) .... Take 1 Tablet By Mouth Once A Day 7)  Nexium 40 Mg Cpdr (Esomeprazole Magnesium) .... Take 1 Capsule By Mouth Once A Day 8)  Detrol La 4 Mg Cp24 (Tolterodine Tartrate) .... Take 1 Tablet By Mouth Once A Day 9)  Centrum Silver  Tabs (Multiple Vitamins-Minerals) .... Take 1 Tablet By Mouth Once A Day For Vitamins 10)  Morphine Sulfate 15 Mg Tabs (Morphine Sulfate) .... Take One Tablet Every 6 Hours As Needed For Breakthrough Pain 11)  Dicyclomine Hcl 10 Mg  Caps (Dicyclomine Hcl) .... Take For Abdominal Pain Up To Four Times Daily. 12)  Glucosamine-Chondroitin 1500-1200 Mg/66ml Liqd (Glucosamine-Chondroitin) .... Take 1 Tablet By Mouth Two Times A Day 13)  Morphine Sulfate Cr 30 Mg Xr12h-Tab (Morphine Sulfate) .... Take 1  Tablet By Mouth Two Times A Day 14)  Citalopram Hydrobromide 20 Mg Tabs (Citalopram Hydrobromide) .... Take 1 Tablet By Mouth Once A Day 15)  Promethazine Hcl 25 Mg Tabs (Promethazine Hcl) .Marland Kitchen.. 1 Tab Every 4 Hrs As Needed For Nausea/vomiting  Allergies: 1)  Tramadol Hcl  Review of Systems       The patient complains of abdominal pain.  The patient denies anorexia, fever, weight loss, weight gain, vision loss, decreased hearing, hoarseness, chest pain, syncope, dyspnea on exertion, peripheral edema, prolonged cough, headaches, hemoptysis, melena, hematochezia, severe indigestion/heartburn, hematuria, incontinence, genital sores, muscle weakness, suspicious skin lesions, transient blindness, difficulty walking, depression, unusual weight change, abnormal  bleeding, enlarged lymph nodes, angioedema, breast masses, and testicular masses.    Physical Exam  General:  alert and well-developed.   Head:  normocephalic and atraumatic.   Eyes:  perrla, eomi Lungs:  CTA b/l Heart:  s1 s2 normal, no murmurs Abdomen:  s/TTp in epigastrium/Nd, + BS, no HSM, no rebound,gaurding   Impression & Recommendations:  Problem # 1:  ABDOMINAL PAIN, GENERALIZED (ICD-789.07) She had simialr pain in past releived by dicyclomine, She has negative workup in the ED. She has episodic cramping pains. She has diarrhea nad consitpations alternatively. Will add IBS in her problem list. Giver her dicyclomine and SSRI. Will ask her to come back in 1-2 weeks if symtoms do not get resolved at which time might consider CT and stool studies if apporpriate. She has also stopeed taking nexium. Wi.ll restart that and give her antiemetic medications.   Problem # 2:  IRRITABLE BOWEL SYNDROME (ICD-564.1) Will give citalopram and dicyclomine  Complete Medication List: 1)  Aspirin 81 Mg Chew (Aspirin) .... Take 1 tablet by mouth once a day 2)  Hydrochlorothiazide 25 Mg Tabs (Hydrochlorothiazide) .... Take 1 tablet by mouth once a day 3)  Calcium 1500 Mg Tabs (Calcium carbonate) .... Take 1 tablet by mouth once a day 4)  Vitamin D 400 Unit Tabs (Vitamin d) .... Take 2 tabs by mouth once daily 5)  Lisinopril 40 Mg Tabs (Lisinopril) .... Take 1 tablet by mouth once a day 6)  Zocor 20 Mg Tabs (Simvastatin) .... Take 1 tablet by mouth once a day 7)  Nexium 40 Mg Cpdr (Esomeprazole magnesium) .... Take 1 capsule by mouth once a day 8)  Detrol La 4 Mg Cp24 (Tolterodine tartrate) .... Take 1 tablet by mouth once a day 9)  Centrum Silver Tabs (Multiple vitamins-minerals) .... Take 1 tablet by mouth once a day for vitamins 10)  Morphine Sulfate 15 Mg Tabs (Morphine sulfate) .... Take one tablet every 6 hours as needed for breakthrough pain 11)  Dicyclomine Hcl 10 Mg Caps (Dicyclomine hcl) ....  Take for abdominal pain up to four times daily. 12)  Glucosamine-chondroitin 1500-1200 Mg/66ml Liqd (Glucosamine-chondroitin) .... Take 1 tablet by mouth two times a day 13)  Morphine Sulfate Cr 30 Mg Xr12h-tab (Morphine sulfate) .... Take 1 tablet by mouth two times a day 14)  Citalopram Hydrobromide 20 Mg Tabs (Citalopram hydrobromide) .... Take 1 tablet by mouth once a day 15)  Promethazine Hcl 25 Mg Tabs (Promethazine hcl) .Marland Kitchen.. 1 tab every 4 hrs as needed for nausea/vomiting  Patient Instructions: 1)  Please schedule a follow-up appointment in 1 month. Come early if pain not better.  Prescriptions: PROMETHAZINE HCL 25 MG TABS (PROMETHAZINE HCL) 1 tab every 4 hrs as needed for nausea/vomiting  #120 x 1   Entered and Authorized by:   Oswaldo Milian  Jaylend Reiland MD   Signed by:   Bethel Born MD on 07/06/2009   Method used:   Electronically to        [facility] [phone]* (retail)       [address]       [place], [place]  [address]       Ph: [phone]       Fax: [phone]   RxID:   [id number] NEXIUM 40 MG CPDR (ESOMEPRAZOLE MAGNESIUM) Take 1 capsule by mouth once a day  #31.0 Each x 5   Entered and Authorized by:   Bethel Born MD   Signed by:   Bethel Born MD on 07/06/2009   Method used:   Electronically to        [facility] [phone]* (retail)       [address]       [place], [place]  [address]       Ph: [phone]       Fax: [phone]   RxID:   [id number] DICYCLOMINE HCL 10 MG  CAPS (DICYCLOMINE HCL) Take for abdominal pain up to four times daily.  #62.0 Each x 1   Entered and Authorized by:   Bethel Born MD   Signed by:   Bethel Born MD on 07/06/2009   Method used:   Electronically to        [facility] [phone]* (retail)       [address]       [place], [place]  [address]       Ph: [phone]       Fax: [phone]   RxID:   [id number] CITALOPRAM  HYDROBROMIDE 20 MG TABS (CITALOPRAM HYDROBROMIDE) Take 1 tablet by mouth once a day  #30 x 1   Entered and Authorized by:   Bethel Born MD   Signed by:   Bethel Born MD on 07/06/2009   Method used:   Electronically to        [facility] [phone]* (retail)       [address]       [place], [place]  [address]       Ph: [phone]       Fax: [phone]   RxID:   [id number]    Prevention & Chronic Care Immunizations   Influenza vaccine: already had '08  (12/04/2006)   Influenza vaccine deferral: Not indicated  (11/04/2008)   Influenza vaccine due: 09/10/2008    Tetanus booster: Not documented   Td booster deferral: Deferred  (07/28/2008)    Pneumococcal vaccine: Not documented   Pneumococcal vaccine deferral: Deferred  (07/28/2008)    H. zoster vaccine: Not documented   H. zoster vaccine deferral: Deferred  (07/28/2008)  Colorectal Screening   Hemoccult: Not documented   Hemoccult action/deferral: Deferred  (11/04/2008)    Colonoscopy: Normal  (02/27/2002)  Other Screening   Pap smear: Not documented   Pap smear action/deferral: Not indicated S/P hysterectomy  (11/04/2008)    Mammogram: ASSESSMENT: Negative - BI-RADS 1^MM DIGITAL SCREENING  (05/29/2009)    DXA bone density scan: Lumbar Spine:  T Score > -1.0 Spine.  Hip Total: T Score -2.5 to -1.0 Hip.   Osteopenia    (05/01/2007)   DXA bone density action/deferral: Deferred  (07/06/2009)  DXA scan due: 05/2009    Smoking status: quit  (07/06/2009)  Lipids   Total Cholesterol: 184  (05/11/2009)   Lipid panel action/deferral: Lipid Panel ordered   LDL: 66  (05/11/2009)   LDL Direct: Not documented   HDL: 91  (05/11/2009)   Triglycerides: 136  (05/11/2009)    SGOT (AST): 16  (07/28/2008)   SGPT (ALT): 13  (07/28/2008)   Alkaline phosphatase: 77  (07/28/2008)   Total bilirubin: 0.4  (07/28/2008)    Lipid flowsheet reviewed?: Yes   Progress toward LDL goal:  Unchanged  Hypertension   Last Blood Pressure: 137 / 75  (07/06/2009)   Serum creatinine: 1.03  (07/28/2008)   Serum potassium 4.4  (07/28/2008)    Hypertension flowsheet reviewed?: Yes   Progress toward BP goal: Improved  Self-Management Support :   Personal Goals (by the next clinic visit) :      Personal blood pressure goal: 140/90  (05/11/2009)     Personal LDL goal: 100  (05/11/2009)    Patient will work on the following items until the next clinic visit to reach self-care goals:     Medications and monitoring: take my medicines every day, bring all of my medications to every visit  (07/06/2009)     Eating: drink diet soda or water instead of juice or soda, eat more vegetables, use fresh or frozen vegetables, eat foods that are low in salt, eat fruit for snacks and desserts, limit or avoid alcohol  (07/06/2009)     Activity: take a 30 minute walk every day  (07/06/2009)    Hypertension self-management support: Written self-care plan, Education handout, Resources for patients handout  (07/06/2009)   Hypertension self-care plan printed.   Hypertension education handout printed    Lipid self-management support: Written self-care plan, Education handout, Resources for patients handout  (07/06/2009)   Lipid self-care plan printed.   Lipid education handout printed      Resource handout printed.

## 2010-02-09 NOTE — Assessment & Plan Note (Signed)
Summary: APPT AT 1330-F/U LAST APPT, NO BETTER, see note/pcp-boggala/ hla   Vital Signs:  Patient profile:   77 year old female Height:      64 inches (162.56 cm) Weight:      190.0 pounds (86.36 kg) BMI:     32.73 Temp:     98.0 degrees F (36.67 degrees C) oral Pulse rate:   65 / minute BP sitting:   132 / 76  (right arm) Cuff size:   large  Vitals Entered By: Cynda Familia Duncan Dull) (July 10, 2009 2:07 PM) CC: Jennifer Owen c/o runny stools with foul odor, decreased appetite, abd pain Pain Assessment Patient in pain? yes     Location: abd pain Intensity: 10 Type: sharp Onset of pain  Constant x 3wks Nutritional Status BMI of > 30 = obese  Does patient need assistance? Functional Status Cook/clean, Shopping Ambulation Impaired:Risk for fall Comments arrived via wheelchair   Primary Care Provider:  Blondell Reveal MD  CC:  Jennifer Owen c/o runny stools with foul odor, decreased appetite, and abd pain.  History of Present Illness: Jennifer Owen is 77 yo woman,w/ PMH of Recurrent Abdominal Pain, GERD, HTN comes to the clinic with abdominal pain since 3 weeks and diarrhea since yesterday. The abdominal pain is 10/10, severe to light palpation, in all 4 quadrants. The pain increases before the bowel movement and gets relief from the pain for sometime after the movement. She also has diarrhea since yesterday, 5 movements /day, loose, yellow and profuse. Jennifer Owen cannot keep any food in. She vomitted today morning 4 am after eating some crackers, but no episode of vomitting after that. She feels cold, but has no fever. (R. Patel,MD, PGY,1)  Jennifer Owen was seen by Dr. Scot Dock here 07/06/09 for this pain. As well she had been seen in ED 6/23 for this same episode. In the ED she had an U/S, cbc, and Cmet that were negative. She was given a few pain meds and phenergan. No plain abdominal films or lipase were done. When she came into the clinic she described her pain as being like a bout of IBS back in 2009 that  dicyclomine had helped and that is what had been prescribed.  The difference in her history today is that there is no longer obvious constipation and there was one episode of emesis this am after crackers. She did not try phenergan before this. The dicyclomine has not helped. Her daughter is with her and she does remember her being very uncomfortable like this when her IBS first flared in 2009.       Current Medications (verified): 1)  Aspirin 81 Mg Chew (Aspirin) .... Take 1 Tablet By Mouth Once A Day 2)  Hydrochlorothiazide 25 Mg Tabs (Hydrochlorothiazide) .... Take 1 Tablet By Mouth Once A Day 3)  Calcium 1500 Mg Tabs (Calcium Carbonate) .... Take 1 Tablet By Mouth Once A Day 4)  Vitamin D 400 Unit Tabs (Vitamin D) .... Take 2 Tabs By Mouth Once Daily 5)  Lisinopril 40 Mg Tabs (Lisinopril) .... Take 1 Tablet By Mouth Once A Day 6)  Zocor 20 Mg Tabs (Simvastatin) .... Take 1 Tablet By Mouth Once A Day 7)  Nexium 40 Mg Cpdr (Esomeprazole Magnesium) .... Take 1 Capsule By Mouth Once A Day 8)  Detrol La 4 Mg Cp24 (Tolterodine Tartrate) .... Take 1 Tablet By Mouth Once A Day 9)  Centrum Silver  Tabs (Multiple Vitamins-Minerals) .... Take 1 Tablet By Mouth Once A Day For Vitamins 10)  Dicyclomine Hcl 10 Mg  Caps (Dicyclomine Hcl) .... Take For Abdominal Pain Up To Four Times Daily. 11)  Glucosamine-Chondroitin 1500-1200 Mg/3ml Liqd (Glucosamine-Chondroitin) .... Take 1 Tablet By Mouth Two Times A Day 12)  Morphine Sulfate Cr 30 Mg Xr12h-Tab (Morphine Sulfate) .... Take 1 Tablet By Mouth Two Times A Day 13)  Citalopram Hydrobromide 20 Mg Tabs (Citalopram Hydrobromide) .... Take 1 Tablet By Mouth Once A Day 14)  Promethazine Hcl 25 Mg Tabs (Promethazine Hcl) .Marland Kitchen.. 1 Tab Every 4 Hrs As Needed For Nausea/vomiting 15)  Hyoscyamine Sulfate 0.125 Mg Subl (Hyoscyamine Sulfate) .... One To Two Pills Under Your Tongue Up To 4 Times A Day A Needed 16)  Potassium Chloride Crys Cr 20 Meq Cr-Tabs (Potassium  Chloride Crys Cr) .... One A Day  Allergies: 1)  Tramadol Hcl  Past History:  Past Medical History: Reviewed history from 04/20/2006 and no changes required. GERD-Papulous Gastropathy EGD Oct 07 Hypertension Low back pain-intermittent radiculopathy Osteoarthritis Pulmonary embolism, hx of-2/2 Knee Replacement Osteopenia (T Score -1.1 R Femur, -0.4 L Spine) Hyperlipidemia Polio in 1942 Urinary Incontinence  Past Surgical History: Reviewed history from 10/26/2005 and no changes required. Total knee replacement-Left 1995, Right 2004 Hysterectomy-1985 Oophorectomy-1985  Family History: Reviewed history from 02/02/2006 and no changes required. Family History of Colon CA 1st degree relative <60-Daughter died  Social History: Reviewed history from 03/20/2008 and no changes required. 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 CV:  Denies chest pain or discomfort. Resp:  Denies cough and shortness of breath. GI:  Complains of abdominal pain, diarrhea, loss of appetite, and nausea; denies bloody stools and constipation; emesis times one earlu this AM. GU:  Denies dysuria.  Physical Exam  General:  alert.   Eyes:  vision grossly intact.   Lungs:  normal respiratory effort and normal breath sounds.   Heart:  normal rate and regular rhythm.   Abdomen:  soft and normal bowel sounds.  tender diffusely, but distractable, absolutely no rebound or guarding    Impression & Recommendations:  Problem # 1:  ABDOMINAL PAIN, GENERALIZED (ICD-789.07) Assessment Comment Only I suspect that this is still IBS flare that did not respond to dicyclomine but need to make sure that her plain abdominal films are normal. As well will check a lipase though I think likelihood of pancreatitis is low. ADDN. The abdominal films and lipase  are normal. The only abnormality is K+ o f3.3. Therefore I have prescribed hyosamine.125 mg 1-2 SL up to 4 times a day, and asked her to take phernergan before trying to eat anything.   Orders: T-Comprehensive Metabolic Panel [id number]) T-Lipase [id number]) Diagnostic X-Ray/Fluoroscopy (Diagnostic X-Ray/Flu)  Problem # 2:  HYPOKALEMIA (ICD-276.8) I have given her a presriptoin for Kdur a day. Hopefully she will be able to take by mouth soon and will be able to take this in.  Problem # 3:  IRRITABLE BOWEL SYNDROME (ICD-564.1) See#1.  Problem # 4:  HYPERTENSION (ICD-401.9) BPis well controlled today. Her updated medication list for this problem includes:    Hydrochlorothiazide 25 Mg Tabs (Hydrochlorothiazide) .Marland Kitchen... Take 1 tablet by mouth once a day    Lisinopril 40 Mg Tabs (Lisinopril) .Marland Kitchen... Take 1 tablet by mouth once a day  Problem # 5:  DIARRHEA (ICD-787.91) I think this is from IBS. I do not think that it is infectious.  There was a stool culture done in the ED thT was negative.  Complete Medication List: 1)  Aspirin 81 Mg Chew (Aspirin) .... Take 1 tablet by mouth once a day 2)  Hydrochlorothiazide 25 Mg Tabs (Hydrochlorothiazide) .... Take 1 tablet by mouth once a day 3)  Calcium 1500 Mg Tabs (Calcium carbonate) .... Take 1 tablet by mouth once a day 4)  Vitamin D 400 Unit Tabs (Vitamin d) .... Take 2 tabs by mouth once daily 5)  Lisinopril 40 Mg Tabs (Lisinopril) .... Take 1 tablet by mouth once a day 6)  Zocor 20 Mg Tabs (Simvastatin) .... Take 1 tablet by mouth once a day 7)  Nexium 40 Mg Cpdr (Esomeprazole magnesium) .... Take 1 capsule by mouth once a day 8)  Detrol La 4 Mg Cp24 (Tolterodine tartrate) .... Take 1 tablet by mouth once a day 9)  Centrum Silver Tabs (Multiple vitamins-minerals) .... Take 1 tablet by mouth once a day for vitamins 10)  Dicyclomine Hcl 10 Mg Caps (Dicyclomine hcl) .... Take for abdominal pain up to four times daily. 11)   Glucosamine-chondroitin 1500-1200 Mg/35ml Liqd (Glucosamine-chondroitin) .... Take 1 tablet by mouth two times a day 12)  Morphine Sulfate Cr 30 Mg Xr12h-tab (Morphine sulfate) .... Take 1 tablet by mouth two times a day 13)  Citalopram Hydrobromide 20 Mg Tabs (Citalopram hydrobromide) .... Take 1 tablet by mouth once a day 14)  Promethazine Hcl 25 Mg Tabs (Promethazine hcl) .Marland Kitchen.. 1 tab every 4 hrs as needed for nausea/vomiting 15)  Hyoscyamine Sulfate 0.125 Mg Subl (Hyoscyamine sulfate) .... One to two pills under your tongue up to 4 times a day a needed 16)  Potassium Chloride Crys Cr 20 Meq Cr-tabs (Potassium chloride crys cr) .... One a day   Patient Instructions: 1)  Keep your appointment as scheduled. 2)  We have started you on hyoscyamine, take 1-2 pills under your tongue up to 4 times a day for your abdominal pain. 3)  Try to take a promethazine for nausea before you have something to eat. Try to wait 30-45 minutes. Prescriptions: POTASSIUM CHLORIDE CRYS CR 20 MEQ CR-TABS (POTASSIUM CHLORIDE CRYS CR) one a day  #30 x 0   Entered and Authorized by:   Zoila Shutter MD   Signed by:   Zoila Shutter MD on 07/10/2009   Method used:   Electronically to        [facility] [phone]* (retail)       [address]       [place], [place]  [address]       Ph: [phone]       Fax: [phone]   RxID:   [id number] HYOSCYAMINE SULFATE 0.125 MG SUBL (HYOSCYAMINE SULFATE) one to two pills under your tongue up to 4 times a day a needed  #40 x 6   Entered and Authorized by:   Zoila Shutter MD   Signed by:   Zoila Shutter MD on 07/10/2009   Method used:   Electronically to        [facility] [phone]* (retail)       [address]       [place], [place]  [address]       Ph: [phone]       Fax: [phone]   RxID:   [id number]  Process Orders Check Orders Results:  [facility]: Check  successful Tests Sent for requisitioning (July 11, 2009 9:51 AM):     07/10/2009: [facility] -- T-Comprehensive Metabolic Panel [[id number]] (signed)     07/10/2009: [facility] -- T-Lipase [phone] (signed)    Prevention & Chronic Care Immunizations   Influenza vaccine: already had '08  (12/04/2006)   Influenza vaccine deferral: Not indicated  (11/04/2008)   Influenza vaccine due: 09/10/2008    Tetanus booster: Not documented   Td booster deferral: Deferred  (07/28/2008)    Pneumococcal vaccine: Not documented   Pneumococcal vaccine deferral: Deferred  (07/28/2008)    H. zoster vaccine: Not documented   H. zoster vaccine deferral: Deferred  (07/28/2008)  Colorectal Screening   Hemoccult: Not documented   Hemoccult action/deferral: Deferred  (11/04/2008)    Colonoscopy: Normal  (02/27/2002)  Other Screening   Pap smear: Not documented   Pap smear action/deferral: Not indicated S/P hysterectomy  (11/04/2008)    Mammogram: ASSESSMENT: Negative - BI-RADS 1^MM DIGITAL SCREENING  (05/29/2009)    DXA bone density scan: Lumbar Spine:  T Score > -1.0 Spine.  Hip Total: T Score -2.5 to -1.0 Hip.   Osteopenia    (05/01/2007)   DXA bone density action/deferral: Deferred  (07/06/2009)   DXA scan due: 05/2009    Smoking status: quit  (07/06/2009)  Lipids   Total Cholesterol: 184  (05/11/2009)   Lipid panel action/deferral: Lipid Panel ordered   LDL: 66  (05/11/2009)   LDL Direct: Not documented   HDL: 91  (05/11/2009)   Triglycerides: 136  (05/11/2009)    SGOT (AST): 16  (07/28/2008)   SGPT (ALT): 13  (07/28/2008) CMP ordered    Alkaline phosphatase: 77  (07/28/2008)   Total bilirubin: 0.4  (07/28/2008)  Hypertension   Last Blood Pressure: 132 / 76  (07/10/2009)   Serum creatinine: 1.03  (07/28/2008)   Serum potassium 4.4  (07/28/2008) CMP ordered   Self-Management Support :   Personal Goals (by the next clinic visit) :       Personal blood pressure goal: 140/90  (05/11/2009)     Personal LDL goal: 100  (05/11/2009)    Hypertension self-management support: Written self-care plan, Education handout, Resources for patients handout  (07/06/2009)    Lipid self-management support: Written self-care plan, Education handout, Resources for patients handout  (07/06/2009)

## 2010-02-09 NOTE — Miscellaneous (Signed)
Summary: [facility] Previsit/prep  Clinical Lists Changes  Medications: Added new medication of DULCOLAX 5 MG  TBEC (BISACODYL) Day before procedure take 2 at 3pm and 2 at 8pm. - Signed Added new medication of METOCLOPRAMIDE HCL 10 MG  TABS (METOCLOPRAMIDE HCL) As per prep instructions. - Signed Added new medication of MIRALAX   POWD (POLYETHYLENE GLYCOL 3350) As per prep  instructions. - Signed Rx of DULCOLAX 5 MG  TBEC (BISACODYL) Day before procedure take 2 at 3pm and 2 at 8pm.;  #4 x 0;  Signed;  Entered by: Wyona Almas RN;  Authorized by: Hart Carwin MD;  Method used: Electronically to [facility] [phone]*, [address], [place], [place], [place]  [address], Ph: [phone], Fax: [phone] Rx of METOCLOPRAMIDE HCL 10 MG  TABS (METOCLOPRAMIDE HCL) As per prep instructions.;  #2 x 0;  Signed;  Entered by: Wyona Almas RN;  Authorized by: Hart Carwin MD;  Method used: Electronically to [facility] [phone]*, [address] [place], [place], [place]  [address], Ph: [phone], Fax: [phone] Rx of MIRALAX   POWD (POLYETHYLENE GLYCOL 3350) As per prep  instructions.;  #255gm x 0;  Signed;  Entered by: Wyona Almas RN;  Authorized by: Hart Carwin MD;  Method used: Electronically to [facility] [phone]*, [address], [place], [place], [place]  [address], Ph: [phone], Fax: [phone] Observations: Added new observation of ALLERGY REV: Done (08/13/2009 9:59)    Prescriptions: MIRALAX   POWD (POLYETHYLENE GLYCOL 3350) As per prep  instructions.  #255gm x 0   Entered by:   Wyona Almas RN   Authorized by:   Hart Carwin MD   Signed by:   Wyona Almas RN on 08/13/2009   Method used:   Electronically to        [facility] [phone]* (retail)       [address]       [place], [place]  [address]       Ph: [phone]       Fax: [phone]   RxID:   [id number] METOCLOPRAMIDE HCL 10  MG  TABS (METOCLOPRAMIDE HCL) As per prep instructions.  #2 x 0   Entered by:   Wyona Almas RN   Authorized by:   Hart Carwin MD   Signed by:   Wyona Almas RN on 08/13/2009   Method used:   Electronically to        [facility] [phone]* (retail)       [address]       [place], [place]  [address]       Ph: [phone]       Fax: [phone]   RxID:   [id number] DULCOLAX 5 MG  TBEC (BISACODYL) Day before procedure take 2 at 3pm and 2 at 8pm.  #4 x 0   Entered by:   Wyona Almas RN   Authorized by:   Hart Carwin MD   Signed by:   Wyona Almas RN on 08/13/2009   Method used:   Electronically to        [facility] [phone]* (retail)       [address]       [place], [place]  [address]       Ph:  [phone]       Fax: [phone]   RxID:   [id number]

## 2010-02-09 NOTE — Progress Notes (Signed)
Summary: refill/gg  Phone Note Refill Request  on February 02, 2009 2:27 PM  Refills Requested: Medication #1:  MORPHINE SULFATE 15 MG TABS Take one tablet every 6 hours as needed for breakthrough pain   Last Refilled: 12/09/2008 Call when ready [phone]   Method Requested: Pick up at Office Initial call taken by: Merrie Roof RN,  February 02, 2009 2:27 PM  Follow-up for Phone Call       Follow-up by: Blanch Media MD,  February 06, 2009 11:52 AM    Prescriptions: MORPHINE SULFATE 15 MG TABS (MORPHINE SULFATE) Take one tablet every 6 hours as needed for breakthrough pain  #120 x 0   Entered and Authorized by:   Blanch Media MD   Signed by:   Blanch Media MD on 02/06/2009   Method used:   Print then Give to Patient   RxID:   [id number]

## 2010-02-09 NOTE — Medication Information (Signed)
Summary: Tax adviser   Imported By: Florinda Marker 03/13/2009 08:56:15  _____________________________________________________________________  External Attachment:    Type:   Image     Comment:   External Document

## 2010-02-09 NOTE — Assessment & Plan Note (Signed)
Summary: EST-ARMS AND LEGS HURTING/CFB   Vital Signs:  Patient profile:   77 year old female Height:      64 inches Weight:      197.3 pounds BMI:     33.99 Temp:     97.1 degrees F oral Pulse rate:   60 / minute BP sitting:   100 / 60  (right arm)  Vitals Entered By: Filomena Jungling NT II (February 09, 2009 10:39 AM) CC: CHECK-UP[ Is Patient Diabetic? No Pain Assessment Patient in pain? yes     Location: ARMS, AND LEGS Intensity: 10 Type: aching Onset of pain  Chronic Nutritional Status BMI of > 30 = obese  Have you ever been in a relationship where you felt threatened, hurt or afraid?No   Does patient need assistance? Functional Status Self care Ambulation Normal   Primary Care Provider:  Blondell Reveal MD  CC:  CHECK-UP[.  History of Present Illness: 77 year old lady with PMH as mentioned below comes to the office for a regular follow up.  She reports that she has had these chronic pains in her R>L shoulders, both hips, both knees (s/p TKR), and reports that morphine helps a little bit but not completely.   She denies any other complaints.   Preventive Screening-Counseling & Management  Alcohol-Tobacco     Smoking Status: quit     Year Quit: 30 years ago  Caffeine-Diet-Exercise     Does Patient Exercise: yes     Type of exercise: WALKING     Times/week: 2  Problems Prior to Update: 1)  Mourning  (ICD-309.0) 2)  Posterior Subcapsular Polar Senile Cataract  (ICD-366.14) 3)  Nonexudative Senile Macular Degeneration Retina  (ICD-362.51) 4)  Chest Pain, Dull  (ICD-786.50) 5)  Leg Pain, Chronic, Left  (ICD-729.5) 6)  Accidental Fall  (ICD-E888.9) 7)  Bordeline Hypercalcemia  (ICD-275.42) 8)  Abdominal Pain, Generalized  (ICD-789.07) 9)  Rectal Pain  (ICD-569.42) 10)  Diverticulosis, Colon  (ICD-562.10) 11)  Erosive Esophagitis  (ICD-530.19) 12)  Abscess, Skin  (ICD-682.9) 13)  Constipation, Chronic  (ICD-564.09) 14)  Rhinitis, Allergic Nos   (ICD-477.9) 15)  Urinary Incontinence  (ICD-788.30) 16)  Hx of Symptom, Urgency, Urination  (ICD-788.63) 17)  Screening For Mlig Neop, Breast, Nos  (ICD-V76.10) 18)  Pneumonia  (ICD-486) 19)  Family History of Colon Ca 1st Degree Relative <60  (ICD-V16.0) 20)  Knee Replacement, Right, Hx of  (ICD-V43.65) 21)  Hyperlipidemia  (ICD-272.4) 22)  Helicobacter Pylori Gastritis, Hx of  (ICD-V12.79) 23)  Disorder, Lumbar Disc W/myelopathy  (ICD-722.73) 24)  Pulmonary Embolism, Hx of  (ICD-V12.51) 25)  Oophorectomy, Hx of  (ICD-V45.77) 26)  Hysterectomy, Hx of  (ICD-V45.77) 27)  Osteopenia  (ICD-733.90) 28)  Knee Replacement, Left, Hx of  (ICD-V43.65) 29)  Osteoarthritis, Multiple Joints  (ICD-715.89) 30)  Low Back Pain  (ICD-724.2) 31)  Hypertension  (ICD-401.9) 32)  Gerd  (ICD-530.81)  Medications Prior to Update: 1)  Aspirin 81 Mg Chew (Aspirin) .... Take 1 Tablet By Mouth Once A Day 2)  Hydrochlorothiazide 25 Mg Tabs (Hydrochlorothiazide) .... Take 1 Tablet By Mouth Once A Day 3)  Calcium 1500 Mg Tabs (Calcium Carbonate) .... Take 1 Tablet By Mouth Once A Day 4)  Vitamin D 400 Unit Tabs (Vitamin D) .... Take 2 Tabs By Mouth Once Daily 5)  Lisinopril 40 Mg Tabs (Lisinopril) .... Take 1 Tablet By Mouth Once A Day 6)  Zocor 20 Mg Tabs (Simvastatin) .... Take 1 Tablet By Mouth Once  A Day 7)  Nexium 40 Mg Cpdr (Esomeprazole Magnesium) .... Take 1 Capsule By Mouth Once A Day 8)  Detrol La 4 Mg Cp24 (Tolterodine Tartrate) .... Take 1 Tablet By Mouth Once A Day 9)  Centrum Silver  Tabs (Multiple Vitamins-Minerals) .... Take 1 Tablet By Mouth Once A Day For Vitamins 10)  Morphine Sulfate 15 Mg Tabs (Morphine Sulfate) .... Take One Tablet Every 6 Hours As Needed For Breakthrough Pain 11)  Dicyclomine Hcl 10 Mg  Caps (Dicyclomine Hcl) .... Take For Abdominal Pain Up To Four Times Daily. 12)  Glucosamine-Chondroitin 1500-1200 Mg/35ml Liqd (Glucosamine-Chondroitin) .... Take 1 Tablet By Mouth Two Times  A Day 13)  Diclofenac Sodium 75 Mg Tbec (Diclofenac Sodium) .... Take 1 Tablet By Mouth Two Times A Day  Current Medications (verified): 1)  Aspirin 81 Mg Chew (Aspirin) .... Take 1 Tablet By Mouth Once A Day 2)  Hydrochlorothiazide 25 Mg Tabs (Hydrochlorothiazide) .... Take 1 Tablet By Mouth Once A Day 3)  Calcium 1500 Mg Tabs (Calcium Carbonate) .... Take 1 Tablet By Mouth Once A Day 4)  Vitamin D 400 Unit Tabs (Vitamin D) .... Take 2 Tabs By Mouth Once Daily 5)  Lisinopril 40 Mg Tabs (Lisinopril) .... Take 1 Tablet By Mouth Once A Day 6)  Zocor 20 Mg Tabs (Simvastatin) .... Take 1 Tablet By Mouth Once A Day 7)  Nexium 40 Mg Cpdr (Esomeprazole Magnesium) .... Take 1 Capsule By Mouth Once A Day 8)  Detrol La 4 Mg Cp24 (Tolterodine Tartrate) .... Take 1 Tablet By Mouth Once A Day 9)  Centrum Silver  Tabs (Multiple Vitamins-Minerals) .... Take 1 Tablet By Mouth Once A Day For Vitamins 10)  Morphine Sulfate 15 Mg Tabs (Morphine Sulfate) .... Take One Tablet Every 6 Hours As Needed For Breakthrough Pain 11)  Dicyclomine Hcl 10 Mg  Caps (Dicyclomine Hcl) .... Take For Abdominal Pain Up To Four Times Daily. 12)  Glucosamine-Chondroitin 1500-1200 Mg/26ml Liqd (Glucosamine-Chondroitin) .... Take 1 Tablet By Mouth Two Times A Day  Allergies (verified): 1)  Tramadol Hcl  Past History:  Family History: Last updated: 02/02/2006 Family History of Colon CA 1st degree relative <60-Daughter died  Risk Factors: Exercise: yes (02/09/2009)  Past Medical History: Reviewed history from 04/20/2006 and no changes required. GERD-Papulous Gastropathy EGD Oct 07 Hypertension Low back pain-intermittent radiculopathy Osteoarthritis Pulmonary embolism, hx of-2/2 Knee Replacement Osteopenia (T Score -1.1 R Femur, -0.4 L Spine) Hyperlipidemia Polio in 1942 Urinary Incontinence  Past Surgical History: Reviewed history from 10/26/2005 and no changes required. Total knee replacement-Left 1995, Right  2004 Hysterectomy-1985 Oophorectomy-1985  Family History: Reviewed history from 02/02/2006 and no changes required. Family History of Colon CA 1st degree relative <60-Daughter died  Social History: Reviewed history from 03/20/2008 and no changes required. 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   Lungs:  Normal respiratory effort, chest expands symmetrically. Lungs are clear to auscultation, no crackles or wheezes. Heart:  Normal rate and regular rhythm. S1 and S2 normal without gallop, murmur, click, rub or other extra sounds. Abdomen:  soft and non-tender.   Msk:  normal ROM, right hip joint tender to palpation, no joint swelling, no joint warmth, no redness over joints, no joint deformities, no joint instability, and  no crepitation.   Pulses:  R radial normal.   Extremities:  No clubbing, cyanosis, edema, or deformity noted with normal full range of motion of all joints.   Neurologic:  alert & oriented X3 and strength normal in all extremities.     Impression & Recommendations:  Problem # 1:  OSTEOARTHRITIS, MULTIPLE JOINTS (ICD-715.89)  Complains of joint pains all over the body despite taking the morphine. Discussed with Dr. Josem Kaufmann and here is the plan. Will start her on long acting morphine 30 mg two times a day and cut down on the quantity of morphine.  The following medications were removed from the medication list:    Diclofenac Sodium 75 Mg Tbec (Diclofenac sodium) .Marland Kitchen... Take 1 tablet by mouth two times a day Her updated medication list for this problem includes:    Aspirin 81 Mg Chew (Aspirin) .Marland Kitchen... Take 1 tablet by mouth once a day    Morphine Sulfate 15 Mg Tabs (Morphine sulfate) .Marland Kitchen... Take one tablet every 6 hours  as needed for breakthrough pain    Ms Contin 30 Mg Xr12h-tab (Morphine sulfate) .Marland Kitchen... Take 1 tablet by mouth two times a day  Discussed use of medications, application of heat or cold, and exercises.    The following medications were removed from the medication list:    Diclofenac Sodium 75 Mg Tbec (Diclofenac sodium) .Marland Kitchen... Take 1 tablet by mouth two times a day  Her updated medication list for this problem includes:    Aspirin 81 Mg Chew (Aspirin) .Marland Kitchen... Take 1 tablet by mouth once a day    Morphine Sulfate 15 Mg Tabs (Morphine sulfate) .Marland Kitchen... Take one tablet every 6 hours as needed for breakthrough pain    Ms Contin 30 Mg Xr12h-tab (Morphine sulfate) .Marland Kitchen... Take 1 tablet by mouth two times a day    Problem # 2:  HYPERTENSION (ICD-401.9) Well controlled. No changes made.   Problem # 3:  LOW BACK PAIN (ICD-724.2) No active issues. Starting her on long acting morphine as long acting pain relief.  The following medications were removed from the medication list:    Diclofenac Sodium 75 Mg Tbec (Diclofenac sodium) .Marland Kitchen... Take 1 tablet by mouth two times a day Her updated medication list for this problem includes:    Aspirin 81 Mg Chew (Aspirin) .Marland Kitchen... Take 1 tablet by mouth once a day    Morphine Sulfate 15 Mg Tabs (Morphine sulfate) .Marland Kitchen... Take one tablet every 6 hours as needed for breakthrough pain    Ms Contin 30 Mg Xr12h-tab (Morphine sulfate) .Marland Kitchen... Take 1 tablet by mouth two times a day   Complete Medication List: 1)  Aspirin 81 Mg Chew (Aspirin) .... Take 1 tablet by mouth once a day 2)  Hydrochlorothiazide 25 Mg Tabs (Hydrochlorothiazide) .... Take 1 tablet by mouth once a day 3)  Calcium 1500 Mg Tabs (Calcium carbonate) .... Take 1 tablet by mouth once a day 4)  Vitamin D 400 Unit Tabs (Vitamin d) .... Take 2 tabs by mouth once daily 5)  Lisinopril 40 Mg Tabs (Lisinopril) .... Take 1 tablet by mouth once a day 6)  Zocor 20 Mg Tabs (Simvastatin) .... Take 1 tablet by mouth once a  day 7)  Nexium 40 Mg Cpdr (Esomeprazole magnesium) .... Take 1 capsule by mouth once a day 8)  Detrol La 4 Mg Cp24 (Tolterodine tartrate) .... Take 1 tablet by mouth once a day 9)  Centrum Silver Tabs (Multiple vitamins-minerals) .... Take 1 tablet by mouth once  a day for vitamins 10)  Morphine Sulfate 15 Mg Tabs (Morphine sulfate) .... Take one tablet every 6 hours as needed for breakthrough pain 11)  Dicyclomine Hcl 10 Mg Caps (Dicyclomine hcl) .... Take for abdominal pain up to four times daily. 12)  Glucosamine-chondroitin 1500-1200 Mg/58ml Liqd (Glucosamine-chondroitin) .... Take 1 tablet by mouth two times a day 13)  Morphine Sulfate Cr 30 Mg Xr12h-tab (Morphine sulfate) .... Take 1 tablet by mouth two times a day  Patient Instructions: 1)  Please schedule a follow-up appointment in 3 months. 2)  Take all the medications as advised below. Prescriptions: MORPHINE SULFATE CR 30 MG XR12H-TAB (MORPHINE SULFATE) Take 1 tablet by mouth two times a day  #60 x 0   Entered and Authorized by:   Blondell Reveal MD   Signed by:   Blondell Reveal MD on 02/09/2009   Method used:   Print then Give to Patient   RxID:   [id number]     Prevention & Chronic Care Immunizations   Influenza vaccine: already had '08  (12/04/2006)   Influenza vaccine deferral: Not indicated  (11/04/2008)   Influenza vaccine due: 09/10/2008    Tetanus booster: Not documented   Td booster deferral: Deferred  (07/28/2008)    Pneumococcal vaccine: Not documented   Pneumococcal vaccine deferral: Deferred  (07/28/2008)    H. zoster vaccine: Not documented   H. zoster vaccine deferral: Deferred  (07/28/2008)  Colorectal Screening   Hemoccult: Not documented   Hemoccult action/deferral: Deferred  (11/04/2008)    Colonoscopy: Normal  (02/27/2002)  Other Screening   Pap smear: Not documented   Pap smear action/deferral: Not indicated S/P hysterectomy  (11/04/2008)    Mammogram: ASSESSMENT: Negative - BI-RADS  1^MM DIGITAL SCREENING  (05/08/2008)    DXA bone density scan: Lumbar Spine:  T Score > -1.0 Spine.  Hip Total: T Score -2.5 to -1.0 Hip.   Osteopenia    (05/01/2007)   DXA scan due: 05/2009    Smoking status: quit  (02/09/2009)  Lipids   Total Cholesterol: 185  (07/28/2008)   Lipid panel action/deferral: Lipid Panel ordered   LDL: 78  (07/28/2008)   LDL Direct: Not documented   HDL: 87  (07/28/2008)   Triglycerides: 98  (07/28/2008)    SGOT (AST): 16  (07/28/2008)   SGPT (ALT): 13  (07/28/2008)   Alkaline phosphatase: 77  (07/28/2008)   Total bilirubin: 0.4  (07/28/2008)  Hypertension   Last Blood Pressure: 100 / 60  (02/09/2009)   Serum creatinine: 1.03  (07/28/2008)   Serum potassium 4.4  (07/28/2008)  Self-Management Support :    Hypertension self-management support: Not documented    Lipid self-management support: Not documented     Impression & Recommendations:  The following medications were removed from the medication list:    Diclofenac Sodium 75 Mg Tbec (Diclofenac sodium) .Marland Kitchen... Take 1 tablet by mouth two times a day  Her updated medication list for this problem includes:    Aspirin 81 Mg Chew (Aspirin) .Marland Kitchen... Take 1 tablet by mouth once a day    Morphine Sulfate 15 Mg Tabs (Morphine sulfate) .Marland Kitchen... Take one tablet every 6 hours as needed for breakthrough pain    Morphine Sulfate Cr 30 Mg Xr12h-tab (Morphine sulfate) .Marland Kitchen... Take 1 tablet by mouth two times a day  The following medications were removed from the medication list:    Diclofenac Sodium 75 Mg Tbec (Diclofenac sodium) .Marland Kitchen... Take 1 tablet by mouth two times a day  Her updated  medication list for this problem includes:    Aspirin 81 Mg Chew (Aspirin) .Marland Kitchen... Take 1 tablet by mouth once a day    Morphine Sulfate 15 Mg Tabs (Morphine sulfate) .Marland Kitchen... Take one tablet every 6 hours as needed for breakthrough pain    Morphine Sulfate Cr 30 Mg Xr12h-tab (Morphine sulfate) .Marland Kitchen... Take 1 tablet by mouth two  times a day   Her updated medication list for this problem includes:    Hydrochlorothiazide 25 Mg Tabs (Hydrochlorothiazide) .Marland Kitchen... Take 1 tablet by mouth once a day    Lisinopril 40 Mg Tabs (Lisinopril) .Marland Kitchen... Take 1 tablet by mouth once a day  Her updated medication list for this problem includes:    Hydrochlorothiazide 25 Mg Tabs (Hydrochlorothiazide) .Marland Kitchen... Take 1 tablet by mouth once a day    Lisinopril 40 Mg Tabs (Lisinopril) .Marland Kitchen... Take 1 tablet by mouth once a day   The following medications were removed from the medication list:    Diclofenac Sodium 75 Mg Tbec (Diclofenac sodium) .Marland Kitchen... Take 1 tablet by mouth two times a day  Her updated medication list for this problem includes:    Aspirin 81 Mg Chew (Aspirin) .Marland Kitchen... Take 1 tablet by mouth once a day    Morphine Sulfate 15 Mg Tabs (Morphine sulfate) .Marland Kitchen... Take one tablet every 6 hours as needed for breakthrough pain    Morphine Sulfate Cr 30 Mg Xr12h-tab (Morphine sulfate) .Marland Kitchen... Take 1 tablet by mouth two times a day  The following medications were removed from the medication list:    Diclofenac Sodium 75 Mg Tbec (Diclofenac sodium) .Marland Kitchen... Take 1 tablet by mouth two times a day  Her updated medication list for this problem includes:    Aspirin 81 Mg Chew (Aspirin) .Marland Kitchen... Take 1 tablet by mouth once a day    Morphine Sulfate 15 Mg Tabs (Morphine sulfate) .Marland Kitchen... Take one tablet every 6 hours as needed for breakthrough pain    Morphine Sulfate Cr 30 Mg Xr12h-tab (Morphine sulfate) .Marland Kitchen... Take 1 tablet by mouth two times a day   Complete Medication List: 1)  Aspirin 81 Mg Chew (Aspirin) .... Take 1 tablet by mouth once a day 2)  Hydrochlorothiazide 25 Mg Tabs (Hydrochlorothiazide) .... Take 1 tablet by mouth once a day 3)  Calcium 1500 Mg Tabs (Calcium carbonate) .... Take 1 tablet by mouth once a day 4)  Vitamin D 400 Unit Tabs (Vitamin d) .... Take 2 tabs by mouth once daily 5)  Lisinopril 40 Mg Tabs (Lisinopril) .... Take 1 tablet by  mouth once a day 6)  Zocor 20 Mg Tabs (Simvastatin) .... Take 1 tablet by mouth once a day 7)  Nexium 40 Mg Cpdr (Esomeprazole magnesium) .... Take 1 capsule by mouth once a day 8)  Detrol La 4 Mg Cp24 (Tolterodine tartrate) .... Take 1 tablet by mouth once a day 9)  Centrum Silver Tabs (Multiple vitamins-minerals) .... Take 1 tablet by mouth once a day for vitamins 10)  Morphine Sulfate 15 Mg Tabs (Morphine sulfate) .... Take one tablet every 6 hours as needed for breakthrough pain 11)  Dicyclomine Hcl 10 Mg Caps (Dicyclomine hcl) .... Take for abdominal pain up to four times daily. 12)  Glucosamine-chondroitin 1500-1200 Mg/75ml Liqd (Glucosamine-chondroitin) .... Take 1 tablet by mouth two times a day 13)  Morphine Sulfate Cr 30 Mg Xr12h-tab (Morphine sulfate) .... Take 1 tablet by mouth two times a day

## 2010-02-09 NOTE — Letter (Signed)
Summary: Miralax Instructions  [facility]  [address]   [place], [place] [address]   Phone: [phone]  Fax: [phone]       TAHLIYAH ANAGNOS    [birth year]    MRN: [id number]       Procedure Day /Date: Thursday  08-27-09     Arrival Time: 9:30 a.m.     Procedure Time: 10:30 a.m.     Location of Procedure:                    x  [facility] (4th Floor)    PREPARATION FOR COLONOSCOPY WITH MIRALAX  Starting 5 days prior to your procedure 08-22-09  do not eat nuts, seeds, popcorn, corn, beans, peas,  salads, or any raw vegetables.  Do not take any fiber supplements (e.g. Metamucil, Citrucel, and Benefiber). ____________________________________________________________________________________________________   THE DAY BEFORE YOUR PROCEDURE         DATE:  08-26-09  DAY: Wednesday  1   Drink clear liquids the entire day-NO SOLID FOOD  2   Do not drink anything colored red or purple.  Avoid juices with pulp.  No orange juice.  3   Drink at least 64 oz. (8 glasses) of fluid/clear liquids during the day to prevent dehydration and help the prep work efficiently.  CLEAR LIQUIDS INCLUDE: Water Jello Ice Popsicles Tea (sugar ok, no milk/cream) Powdered fruit flavored drinks Coffee (sugar ok, no milk/cream) Gatorade Juice: apple, white grape, white cranberry  Lemonade Clear bullion, consomm, broth Carbonated beverages (any kind) Strained chicken noodle soup Hard Candy  4   Mix the entire bottle of Miralax with 64 oz. of Gatorade/Powerade in the morning and put in the refrigerator to chill.  5   At 3:00 pm take 2 Dulcolax/Bisacodyl tablets.  6   At 4:30 pm take one Reglan/Metoclopramide tablet.  7  Starting at 5:00 pm drink one 8 oz glass of the Miralax mixture every 15-20 minutes until you have finished drinking the entire 64 oz.  You should finish drinking prep around 7:30 or 8:00 pm.  8   If you are nauseated, you may take the 2nd  Reglan/Metoclopramide tablet at 6:30 pm.        9    At 8:00 pm take 2 more DULCOLAX/Bisacodyl tablets.     THE DAY OF YOUR PROCEDURE      DATE:  08-27-09 DAY: Thursday  You may drink clear liquids until 8:30 a.m. (2 HOURS BEFORE PROCEDURE).   MEDICATION INSTRUCTIONS  Unless otherwise instructed, you should take regular prescription medications with a small sip of water as early as possible the morning of your procedure.    Additional medication instructions: Hold HCTZ the morning of procedure.         OTHER INSTRUCTIONS  You will need a responsible adult at least 75 years of age to accompany you and drive you home.   This person must remain in the waiting room during your procedure.  Wear loose fitting clothing that is easily removed.  Leave jewelry and other valuables at home.  However, you may wish to bring a book to read or an iPod/MP3 player to listen to music as you wait for your procedure to start.  Remove all body piercing jewelry and leave at home.  Total time from sign-in until discharge is approximately 2-3 hours.  You should go home directly after your procedure and rest.  You can resume normal activities the day after your procedure.  The day of your procedure you should not:   Drive   Make legal decisions   Operate machinery   Drink alcohol   Return to work  You will receive specific instructions about eating, activities and medications before you leave.   The above instructions have been reviewed and explained to me by   Wyona Almas RN  August 13, 2009 11:20 AM     I fully understand and can verbalize these instructions _____________________________ Date _______

## 2010-02-09 NOTE — Progress Notes (Signed)
Summary: med refill/gp  Phone Note Refill Request Message from:  Fax from Pharmacy on October 07, 2009 10:01 AM  Refills Requested: Medication #1:  PROMETHAZINE HCL 25 MG TABS 1 tab every 4 hrs as needed for nausea/vomiting Next appt. 10/09/09.   Method Requested: Electronic Initial call taken by: Chinita Pester RN,  October 07, 2009 10:02 AM  Follow-up for Phone Call       Follow-up by: Blondell Reveal MD,  October 08, 2009 9:52 AM    Prescriptions: PROMETHAZINE HCL 25 MG TABS (PROMETHAZINE HCL) 1 tab every 4 hrs as needed for nausea/vomiting  #60 x 1   Entered and Authorized by:   Blondell Reveal MD   Signed by:   Blondell Reveal MD on 10/08/2009   Method used:   Faxed to ...       [facility] (retail)       [address] [place]       [address]       [place], [place]  [address]  [place]       Ph: [phone]       Fax: [phone]   RxID:   [id number]

## 2010-02-09 NOTE — Progress Notes (Signed)
Summary: med refill/gp  Phone Note Refill Request Message from:  Patient on December 09, 2009 5:00 PM  Refills Requested: Medication #1:  MORPHINE SULFATE CR 30 MG XR12H-TAB Take 1 tablet by mouth two times a day  Method Requested: Pick up at Office Initial call taken by: Chinita Pester RN,  December 09, 2009 5:00 PM  Follow-up for Phone Call       Follow-up by: Blondell Reveal MD,  December 10, 2009 1:55 PM    Prescriptions: MORPHINE SULFATE CR 30 MG XR12H-TAB (MORPHINE SULFATE) Take 1 tablet by mouth two times a day  #60 x 0   Entered and Authorized by:   Blondell Reveal MD   Signed by:   Blondell Reveal MD on 12/10/2009   Method used:   Print then Give to Patient   RxID:   [id number]

## 2010-02-09 NOTE — Assessment & Plan Note (Signed)
Summary: L foot burn,2visits to ED/pcp-boggala/hla   Vital Signs:  Patient profile:   77 year old female Height:      64 inches Weight:      195.2 pounds BMI:     33.63 Temp:     97.1 degrees F oral Pulse rate:   81 / minute BP sitting:   146 / 82  (right arm)  Vitals Entered By: Filomena Jungling NT II (October 20, 2009 8:35 AM) CC: ERFOLLOWUP FOR FOOT Is Patient Diabetic? No Pain Assessment Patient in pain? yes     Location: foot Intensity: 5 Type: aching Onset of pain  Gradual Nutritional Status BMI of > 30 = obese  Have you ever been in a relationship where you felt threatened, hurt or afraid?No   Does patient need assistance? Functional Status Self care Ambulation Normal   Primary Care Provider:  Blondell Reveal MD  CC:  ERFOLLOWUP FOR FOOT.  History of Present Illness: 77 y/o w comes in today for evaluation of burn -she dropped a cup of hot tea on her right foot 10 days ago - went to the ER twice since, for follow up. - has been using silver sulfadiazine two times a day for infection prevention - pain controlled, using hydrocodone/apap - no fever, chills, worsening pain, redness over the affected are. swelling. - had blisters in the burnt area which burst few days ago.no furhter blisters - no other areas of burn except right foot   Preventive Screening-Counseling & Management  Alcohol-Tobacco     Smoking Status: quit     Year Quit: 30 years ago  Caffeine-Diet-Exercise     Does Patient Exercise: yes     Type of exercise: WALKING     Times/week: 2  Allergies: 1)  Tramadol Hcl  Review of Systems  The patient denies anorexia, fever, weight loss, weight gain, vision loss, decreased hearing, hoarseness, chest pain, syncope, dyspnea on exertion, peripheral edema, prolonged cough, headaches, hemoptysis, abdominal pain, melena, hematochezia, severe indigestion/heartburn, hematuria, incontinence, genital sores, muscle weakness, suspicious skin lesions, transient  blindness, difficulty walking, depression, unusual weight change, abnormal bleeding, enlarged lymph nodes, angioedema, breast masses, and testicular masses.    Physical Exam  General:  alert, well-developed, and well-nourished.   Head:  normocephalic and atraumatic.   Eyes:  vision grossly intact, pupils equal, pupils round, and pupils reactive to light.   Ears:  R ear normal and L ear normal.   Nose:  no external deformity.   Neck:  supple and full ROM.   Lungs:  normal respiratory effort, no intercostal retractions, normal breath sounds, and no dullness.   Heart:  normal rate, regular rhythm, no murmur, no gallop, and no rub.   Abdomen:  soft, non-tender, normal bowel sounds, no distention, no masses, no guarding, and no rigidity.   Msk:  normal ROM, no joint tenderness, and no joint swelling.   Pulses:  dorsalis pedis pulses normal bilaterally  Extremities:  right foot - burn area extending to little below the ankle - superficial, partial thickness - no blister - no eryhtema, swelling, discharge, blood - good pulsation in the fot - silver sulfadizne cream applied          Neurologic:  alert & oriented X3, cranial nerves II-XII intact, strength normal in all extremities, and sensation intact to light touch.     Impression & Recommendations:  Problem # 1:  BURN OF UNSPECIFIED DEGREE OF FOOT (ICD-945.02)  healing well  plan -conitnue ssd cream two times  a day  - wound care consult outpatient- (**patient will be seen after 3 days) - dressing change two times a day -wash with soapwater/clean water once daily  - RTC for worsening pain not controlled with vicodin, fever, chills or other signs of infection - follow up in 4 weeks  Orders: Wound Care Center Referral (Wound Care)  Complete Medication List: 1)  Aspirin 81 Mg Chew (Aspirin) .... Take 1 tablet by mouth once a day 2)  Hydrochlorothiazide 25 Mg Tabs (Hydrochlorothiazide) .... Take 1 tablet by mouth once a day 3)   Lisinopril 40 Mg Tabs (Lisinopril) .... Take 1 tablet by mouth once a day 4)  Zocor 20 Mg Tabs (Simvastatin) .... Take 1 tablet by mouth once a day 5)  Nexium 40 Mg Cpdr (Esomeprazole magnesium) .... Take 1 capsule by mouth once a day 6)  Centrum Silver Tabs (Multiple vitamins-minerals) .... Take 1 tablet by mouth once a day for vitamins 7)  Glucosamine-chondroitin 1500-1200 Mg/31ml Liqd (Glucosamine-chondroitin) .... Take 1 tablet by mouth two times a day 8)  Morphine Sulfate Cr 30 Mg Xr12h-tab (Morphine sulfate) .... Take 1 tablet by mouth two times a day 9)  Hydrocodone-acetaminophen 5-500 Mg Tabs (Hydrocodone-acetaminophen) .Marland Kitchen.. 1 tablet every 4 hrs as needed for pain in foot 10)  Silver Sulfadiazine 1 % Crea (Silver sulfadiazine) .... Two times a day on the affected area  Patient Instructions: 1)  Please schedule a follow-up appointment in 3-4 weeks.  Prescriptions: SILVER SULFADIAZINE 1 % CREA (SILVER SULFADIAZINE) two times a day on the affected area  #1 x 0   Entered and Authorized by:   Bethel Born MD   Signed by:   Bethel Born MD on 10/20/2009   Method used:   Print then Give to Patient   RxID:   [id number] HYDROCODONE-ACETAMINOPHEN 5-500 MG TABS (HYDROCODONE-ACETAMINOPHEN) 1 tablet every 4 hrs as needed for pain in foot  #20 x 0   Entered and Authorized by:   Bethel Born MD   Signed by:   Bethel Born MD on 10/20/2009   Method used:   Print then Give to Patient   RxID:   [id number] SILVER SULFADIAZINE 1 % CREA (SILVER SULFADIAZINE) two times a day on the affected area  #1 x 0   Entered and Authorized by:   Bethel Born MD   Signed by:   Bethel Born MD on 10/20/2009   Method used:   Print then Give to Patient   RxID:   [id number] HYDROCODONE-ACETAMINOPHEN 5-500 MG TABS (HYDROCODONE-ACETAMINOPHEN) 1 tablet every 4 hrs as needed for pain in foot  #20 x 0   Entered and Authorized by:   Bethel Born MD   Signed by:   Bethel Born MD on 10/20/2009    Method used:   Print then Give to Patient   RxID:   [id number]   Prevention & Chronic Care Immunizations   Influenza vaccine: already had '08  (12/04/2006)   Influenza vaccine deferral: Contraindicated  (10/09/2009)   Influenza vaccine due: 09/10/2008    Tetanus booster: Not documented   Td booster deferral: Refused  (07/20/2009)    Pneumococcal vaccine: Not documented   Pneumococcal vaccine deferral: Contraindicated  (10/09/2009)    H. zoster vaccine: Not documented   H. zoster vaccine deferral: Refused  (07/20/2009)  Colorectal Screening   Hemoccult: Not documented   Hemoccult action/deferral: Not indicated  (07/20/2009)    Colonoscopy: DONE  (08/27/2009)   Colonoscopy action/deferral: GI referral  (07/20/2009)   Colonoscopy due: 08/2019  Other Screening   Pap smear: Not documented   Pap smear action/deferral: Not indicated S/P hysterectomy  (11/04/2008)    Mammogram: ASSESSMENT: Negative - BI-RADS 1^MM DIGITAL SCREENING  (05/29/2009)   Mammogram action/deferral: Not indicated  (07/20/2009)    DXA bone density scan: Lumbar Spine:  T Score > -1.0 Spine.  Hip Total: T Score -2.5 to -1.0 Hip.   Osteopenia    (05/01/2007)   DXA bone density action/deferral: Deferred  (07/06/2009)   DXA scan due: 05/2009    Smoking status: quit  (10/20/2009)  Lipids   Total Cholesterol: 184  (05/11/2009)   Lipid panel action/deferral: Lipid Panel ordered   LDL: 66  (05/11/2009)   LDL Direct: Not documented   HDL: 91  (05/11/2009)   Triglycerides: 136  (05/11/2009)    SGOT (AST): 20  (07/10/2009)   SGPT (ALT): 19  (07/10/2009)   Alkaline phosphatase: 82  (07/10/2009)   Total bilirubin: 0.6  (07/10/2009)  Hypertension   Last Blood Pressure: 146 / 82  (10/20/2009)   Serum creatinine: 1.00  (07/20/2009)   Serum potassium 3.6  (07/20/2009)  Self-Management Support :   Personal Goals (by the next clinic visit) :      Personal blood pressure goal: 140/90  (05/11/2009)      Personal LDL goal: 100  (05/11/2009)    Patient will work on the following items until the next clinic visit to reach self-care goals:     Medications and monitoring: take my medicines every day, bring all of my medications to every visit  (10/20/2009)     Eating: drink diet soda or water instead of juice or soda, eat more vegetables, use fresh or frozen vegetables, eat foods that are low in salt, eat baked foods instead of fried foods, eat fruit for snacks and desserts, limit or avoid alcohol  (10/20/2009)     Activity: take a 30 minute walk every day  (10/20/2009)    Hypertension self-management support: Education handout, Resources for patients handout  (10/20/2009)   Hypertension education handout printed    Lipid self-management support: Education handout, Resources for patients handout  (10/20/2009)     Lipid education handout printed      Resource handout printed.   Appended Document: L foot burn,2visits to ED/pcp-boggala/hla    Clinical Lists Changes  Orders: Added new Service order of Est. Patient Level III ([address]) - Signed      Appended Document: L foot burn,2visits to ED/pcp-boggala/hla Hydrocodone/Acetaminophen and Silver Sulfadiazine cream Rxs called to Kathlene November at [facility] [employer].

## 2010-02-09 NOTE — Letter (Signed)
Summary: [facility]   Imported By: Shon Hough 11/04/2009 09:21:44  _____________________________________________________________________  External Attachment:    Type:   Image     Comment:   External Document

## 2010-02-09 NOTE — Progress Notes (Signed)
Summary: appt/ hla  Phone Note Call from Patient   Summary of Call: pt called to c/o she is no better from last visit, cont to have gi symptoms, painful, nothing helps, nothing makes it worse. she desires appt, given for 1330 Initial call taken by: Marin Roberts RN,  July 16, 2009 6:00 PM

## 2010-02-09 NOTE — Medication Information (Signed)
Summary: [employer]   Imported By: Florinda Marker 02/09/2009 15:25:12  _____________________________________________________________________  External Attachment:    Type:   Image     Comment:   External Document

## 2010-02-09 NOTE — Assessment & Plan Note (Signed)
Summary: ACUTE/BOGGALA/OK TO ADD PER GAYLE STOMACH PAIN/CH   Vital Signs:  Patient profile:   77 year old female Height:      64 inches (162.56 cm) Weight:      191.9 pounds (88.73 kg) BMI:     33.63 Temp:     97.7 degrees F (36.50 degrees C) oral Pulse rate:   64 / minute BP sitting:   121 / 69  (left arm) Cuff size:   large  Vitals Entered By: Theotis Barrio NT II (November 25, 2009 4:00 PM) CC: MEDICATION REFILL  / ABD PAIN - #8 - STARTED LAST NIGHT Is Patient Diabetic? No Pain Assessment Patient in pain? yes     Location: abdomen Intensity:     8 Type: sharp Onset of pain  LAST NIGHT Nutritional Status BMI of > 30 = obese  Have you ever been in a relationship where you felt threatened, hurt or afraid?No   Does patient need assistance? Functional Status Self care Ambulation Normal Comments ABD PAIN   # 8 / PAIN STARTED LAST NIGHT   Primary Care Provider:  Blondell Reveal MD  CC:  MEDICATION REFILL  / ABD PAIN - #8 - STARTED LAST NIGHT.  History of Present Illness: 77 y/o woman with PMH significant for IBS ,GERD, erosive esophagitis comes into the clinic today for diffuse abdominal pian.  She says that her pain started last night around 2pm, describes as sharp, intermittent pain  that comes and goes, lasts for about 20 min. No aggravating or relieving factors.   Denies N/V/D/ hematochezia/ fever.  Preventive Screening-Counseling & Management  Alcohol-Tobacco     Smoking Status: quit     Year Quit: 30 years ago  Caffeine-Diet-Exercise     Does Patient Exercise: yes     Type of exercise: WALKING     Times/week: 2  Current Medications (verified): 1)  Aspirin 81 Mg Chew (Aspirin) .... Take 1 Tablet By Mouth Once A Day 2)  Hydrochlorothiazide 25 Mg Tabs (Hydrochlorothiazide) .... Take 1 Tablet By Mouth Once A Day 3)  Lisinopril 40 Mg Tabs (Lisinopril) .... Take 1 Tablet By Mouth Once A Day 4)  Zocor 20 Mg Tabs (Simvastatin) .... Take 1 Tablet By Mouth Once A  Day 5)  Nexium 40 Mg Cpdr (Esomeprazole Magnesium) .... Take 1 Capsule By Mouth Once A Day 6)  Centrum Silver  Tabs (Multiple Vitamins-Minerals) .... Take 1 Tablet By Mouth Once A Day For Vitamins 7)  Glucosamine-Chondroitin 1500-1200 Mg/23ml Liqd (Glucosamine-Chondroitin) .... Take 1 Tablet By Mouth Two Times A Day 8)  Morphine Sulfate Cr 30 Mg Xr12h-Tab (Morphine Sulfate) .... Take 1 Tablet By Mouth Two Times A Day 9)  Hydrocodone-Acetaminophen 5-500 Mg Tabs (Hydrocodone-Acetaminophen) .Marland Kitchen.. 1 Tablet Every 4 Hrs As Needed For Pain in Foot 10)  Silver Sulfadiazine 1 % Crea (Silver Sulfadiazine) .... Two Times A Day On The Affected Area  Allergies (verified): 1)  Tramadol Hcl  Review of Systems      See HPI  The patient denies anorexia, fever, chest pain, syncope, dyspnea on exertion, peripheral edema, and prolonged cough.    Physical Exam  General:  alert, in some distress due to pain, and well-developed.   Head:  normocephalic and atraumatic.   Mouth:  pharynx pink and moist.   Neck:  supple and full ROM.   Lungs:  normal respiratory effort, no intercostal retractions, no accessory muscle use, normal breath sounds, no dullness, no fremitus, no crackles, and no wheezes.   Heart:  normal rate, regular rhythm, no murmur, no gallop, no rub, no JVD, and no HJR.   Abdomen:  soft , diffuse mild tendernessand normal bowel sounds.  soft, normal bowel sounds, no distention, no masses, no guarding, and no rigidity.   Msk:  normal ROM, no joint tenderness, no joint swelling, no joint warmth, and no redness over joints.   Pulses:  2+pulses b/l. Extremities:  no cyanosis, no clubbing or edema. Neurologic:  alert & oriented X3, cranial nerves II-XII intact, strength normal in all extremities, sensation intact to light touch, sensation intact to pinprick, and gait normal.     Impression & Recommendations:  Problem # 1:  ABDOMINAL PAIN, GENERALIZED (ICD-789.07) Assessment Comment Only She reports  diffuse intermiirtent sharp abdominal pain. Denies N/V/D/bleeding per rectum/fever. She moved her bowel yesterday. On exam BS were present. This is most likey IBS. DD include  GERD vs pancreatitis vs PUD vs obstipation vs intestinal obstruction. Will get an AXR -supine and errect to rule out obstruction(though very less likely as the patient moved her bowel yesterday and BS+). Will call in the prescription for hyoscyamine if other causes for her abdominal pain are ruled out. Orders: T-Comprehensive Metabolic Panel [phone]) T-CBC w/Diff [id number]) T-Lipase [phone]) T-Urinalysis ([id number]) Diagnostic X-Ray/Fluoroscopy (Diagnostic X-Ray/Flu)  Problem # 2:  BURN OF UNSPECIFIED DEGREE OF FOOT (ICD-945.02) Assessment: Improved Her burn is healing well. Continue the same treatment.  Complete Medication List: 1)  Aspirin 81 Mg Chew (Aspirin) .... Take 1 tablet by mouth once a day 2)  Hydrochlorothiazide 25 Mg Tabs (Hydrochlorothiazide) .... Take 1 tablet by mouth once a day 3)  Lisinopril 40 Mg Tabs (Lisinopril) .... Take 1 tablet by mouth once a day 4)  Zocor 20 Mg Tabs (Simvastatin) .... Take 1 tablet by mouth once a day 5)  Nexium 40 Mg Cpdr (Esomeprazole magnesium) .... Take 1 capsule by mouth once a day 6)  Centrum Silver Tabs (Multiple vitamins-minerals) .... Take 1 tablet by mouth once a day for vitamins 7)  Glucosamine-chondroitin 1500-1200 Mg/50ml Liqd (Glucosamine-chondroitin) .... Take 1 tablet by mouth two times a day 8)  Morphine Sulfate Cr 30 Mg Xr12h-tab (Morphine sulfate) .... Take 1 tablet by mouth two times a day 9)  Hydrocodone-acetaminophen 5-500 Mg Tabs (Hydrocodone-acetaminophen) .Marland Kitchen.. 1 tablet every 4 hrs as needed for pain in foot 10)  Silver Sulfadiazine 1 % Crea (Silver sulfadiazine) .... Two times a day on the affected area 11)  Hyoscyamine Sulfate 0.125 Mg Tabs (Hyoscyamine sulfate) .... One or two pills under your tongue every 4 hours as needed for  pain  Patient Instructions: 1)  Please take your medicies as prescribed. 2)  We will call you tomorrow morning and prescribe you the medication for your belly pain based on the lab results. Prescriptions: HYOSCYAMINE SULFATE 0.125 MG TABS (HYOSCYAMINE SULFATE) one or two pills under your tongue every 4 hours as needed for pain  #40 x 6   Entered and Authorized by:   Elyse Jarvis   Signed by:   Elyse Jarvis on 11/26/2009   Method used:   Electronically to        [facility]* (retail)       [address]       [address]       [place], [place]  [address]       Ph: [phone]       Fax: [phone]   RxID:   [id number] NEXIUM 40 MG CPDR (ESOMEPRAZOLE MAGNESIUM) Take 1  capsule by mouth once a day  #31.0 Each x 5   Entered and Authorized by:   Elyse Jarvis   Signed by:   Elyse Jarvis on 11/25/2009   Method used:   Print then Give to Patient   RxID:   [id number] ZOCOR 20 MG TABS (SIMVASTATIN) Take 1 tablet by mouth once a day  #31.0 Each x 10   Entered and Authorized by:   Elyse Jarvis   Signed by:   Elyse Jarvis on 11/25/2009   Method used:   Print then Give to Patient   RxID:   [id number] LISINOPRIL 40 MG TABS (LISINOPRIL) Take 1 tablet by mouth once a day  #30.0 Each x 10   Entered and Authorized by:   Elyse Jarvis   Signed by:   Elyse Jarvis on 11/25/2009   Method used:   Print then Give to Patient   RxID:   [id number] HYDROCHLOROTHIAZIDE 25 MG TABS (HYDROCHLOROTHIAZIDE) Take 1 tablet by mouth once a day  #30.0 Each x 10   Entered and Authorized by:   Elyse Jarvis   Signed by:   Elyse Jarvis on 11/25/2009   Method used:   Print then Give to Patient   RxID:   [id number]    Orders Added: 1)  T-Comprehensive Metabolic Panel [[id number]] 2)  T-CBC w/Diff [[id number]] 3)  T-Lipase [[id number]] 4)  T-Urinalysis [[id number]] 5)  Diagnostic X-Ray/Fluoroscopy [Diagnostic X-Ray/Flu]    Prevention & Chronic  Care Immunizations   Influenza vaccine: already had '08  (12/04/2006)   Influenza vaccine deferral: Contraindicated  (10/09/2009)   Influenza vaccine due: 09/10/2008    Tetanus booster: Not documented   Td booster deferral: Refused  (07/20/2009)    Pneumococcal vaccine: Not documented   Pneumococcal vaccine deferral: Contraindicated  (10/09/2009)    H. zoster vaccine: Not documented   H. zoster vaccine deferral: Refused  (07/20/2009)  Colorectal Screening   Hemoccult: Not documented   Hemoccult action/deferral: Not indicated  (07/20/2009)    Colonoscopy: DONE  (08/27/2009)   Colonoscopy action/deferral: GI referral  (07/20/2009)   Colonoscopy due: 08/2019  Other Screening   Pap smear: Not documented   Pap smear action/deferral: Not indicated S/P hysterectomy  (11/04/2008)    Mammogram: ASSESSMENT: Negative - BI-RADS 1^MM DIGITAL SCREENING  (05/29/2009)   Mammogram action/deferral: Not indicated  (07/20/2009)    DXA bone density scan: Lumbar Spine:  T Score > -1.0 Spine.  Hip Total: T Score -2.5 to -1.0 Hip.   Osteopenia    (05/01/2007)   DXA bone density action/deferral: Deferred  (07/06/2009)   DXA scan due: 05/2009    Smoking status: quit  (11/25/2009)  Lipids   Total Cholesterol: 184  (05/11/2009)   Lipid panel action/deferral: Lipid Panel ordered   LDL: 66  (05/11/2009)   LDL Direct: Not documented   HDL: 91  (05/11/2009)   Triglycerides: 136  (05/11/2009)    SGOT (AST): 20  (07/10/2009)   SGPT (ALT): 19  (07/10/2009) CMP ordered    Alkaline phosphatase: 82  (07/10/2009)   Total bilirubin: 0.6  (07/10/2009)  Hypertension   Last Blood Pressure: 121 / 69  (11/25/2009)   Serum creatinine: 1.00  (07/20/2009)   Serum potassium 3.6  (07/20/2009) CMP ordered   Self-Management Support :   Personal Goals (by the next clinic visit) :      Personal blood pressure goal: 140/90  (05/11/2009)     Personal LDL goal: 100  (05/11/2009)    Patient will work on the  following items until the next clinic visit to reach self-care goals:     Medications and monitoring: take my medicines every day, bring all of my medications to every visit  (11/25/2009)     Eating: drink diet soda or water instead of juice or soda, eat more vegetables, use fresh or frozen vegetables, eat foods that are low in salt, eat baked foods instead of fried foods, eat fruit for snacks and desserts, limit or avoid alcohol  (11/25/2009)     Activity: take a 30 minute walk every day  (11/25/2009)    Hypertension self-management support: Resources for patients handout  (11/25/2009)    Lipid self-management support: Resources for patients handout  (11/25/2009)        Resource handout printed.  Process Orders Check Orders Results:     [facility]: Check successful Tests Sent for requisitioning (November 26, 2009 11:44 AM):     11/25/2009: [facility] -- T-Comprehensive Metabolic Panel [[id number]] (signed)     11/25/2009: [facility] -- T-CBC w/Diff [[id number]] (signed)     11/25/2009: [facility] -- T-Lipase [phone] (signed)     11/25/2009: [facility] -- T-Urinalysis [[id number]] (signed)

## 2010-02-09 NOTE — Letter (Signed)
Summary: [facility]   Imported By: Margie Billet 07/01/2009 10:58:19  _____________________________________________________________________  External Attachment:    Type:   Image     Comment:   External Document

## 2010-02-09 NOTE — Medication Information (Signed)
Summary: MORPHINE SULFATE  MORPHINE SULFATE   Imported By: Margie Billet 04/20/2009 09:59:23  _____________________________________________________________________  External Attachment:    Type:   Image     Comment:   External Document

## 2010-02-09 NOTE — Progress Notes (Signed)
Summary: Refill/gh  Phone Note Refill Request Message from:  Fax from Pharmacy on October 01, 2009 4:23 PM  Refills Requested: Medication #1:  PROMETHAZINE HCL 25 MG TABS 1 tab every 4 hrs as needed for nausea/vomiting Needs to go to [facility] at [phone] fax   Method Requested: Fax to Local Pharmacy Initial call taken by: Angelina Ok RN,  October 01, 2009 4:23 PM  Follow-up for Phone Call       Follow-up by: Blondell Reveal MD,  October 08, 2009 9:22 AM    Prescriptions: PROMETHAZINE HCL 25 MG TABS (PROMETHAZINE HCL) 1 tab every 4 hrs as needed for nausea/vomiting  #60 x 1   Entered and Authorized by:   Blondell Reveal MD   Signed by:   Blondell Reveal MD on 10/08/2009   Method used:   Electronically to        [facility] [phone]* (retail)       [address]       [place], [place]  [address]       Ph: [phone]       Fax: [phone]   RxID:   [id number]

## 2010-02-09 NOTE — Progress Notes (Signed)
Summary: refill/gg  Phone Note Refill Request  on May 22, 2009 12:21 PM  Refills Requested: Medication #1:  MORPHINE SULFATE CR 30 MG XR12H-TAB Take 1 tablet by mouth two times a day.   Last Refilled: 03/11/2009 Call when ready @ [phone]   Method Requested: Telephone to Pharmacy Initial call taken by: Merrie Roof RN,  May 22, 2009 12:22 PM  Follow-up for Phone Call       Follow-up by: Blondell Reveal MD,  May 22, 2009 3:47 PM    Prescriptions: MORPHINE SULFATE CR 30 MG XR12H-TAB (MORPHINE SULFATE) Take 1 tablet by mouth two times a day  #60 x 0   Entered and Authorized by:   Blondell Reveal MD   Signed by:   Blondell Reveal MD on 05/22/2009   Method used:   Print then Give to Patient   RxID:   [id number]   Appended Document: refill/gg Pt informed rx is ready

## 2010-02-09 NOTE — Assessment & Plan Note (Signed)
Summary: 3 MONTH/CFB   Vital Signs:  Patient profile:   77 year old female Height:      64 inches (162.56 cm) Weight:      202.0 pounds (89.68 kg) BMI:     33.99 Temp:     94.4 degrees F (34.67 degrees C) oral Pulse rate:   53 / minute BP sitting:   163 / 80  (right arm) Cuff size:   regular  Vitals Entered By: Theotis Barrio NT II (May 11, 2009 2:34 PM) CC: ? MEDICATION REFILL  /   PAIN  LEFT HIP PAIN   #   8    /  Is Patient Diabetic? No Pain Assessment Patient in pain? yes     Location: L/ HIP Intensity:      8 Onset of pain  Chronic  Does patient need assistance? Functional Status Self care Ambulation Normal Comments ? MEDICATION REFILL   /   PAIN LEFT HIP  PAIN  #8   Primary Care Provider:  Blondell Reveal MD  CC:  ? MEDICATION REFILL  /   PAIN  LEFT HIP PAIN   #   8    / .  History of Present Illness: 77 year old lady with PMH as mentioned below comes to the office for a regular follow up.  She reports that she has had these chronic pains in her R>L shoulders, both hips, both knees (s/p TKR), but reports that her left hip has been bothering her a lot. She reports compliance to all her medications.  She denies any other complaints.   Preventive Screening-Counseling & Management  Alcohol-Tobacco     Smoking Status: quit     Year Quit: 30 years ago  Caffeine-Diet-Exercise     Does Patient Exercise: yes     Type of exercise: WALKING     Times/week: 2  Problems Prior to Update: 1)  Mourning  (ICD-309.0) 2)  Posterior Subcapsular Polar Senile Cataract  (ICD-366.14) 3)  Nonexudative Senile Macular Degeneration Retina  (ICD-362.51) 4)  Chest Pain, Dull  (ICD-786.50) 5)  Leg Pain, Chronic, Left  (ICD-729.5) 6)  Accidental Fall  (ICD-E888.9) 7)  B[place]eline Hypercalcemia  (ICD-275.42) 8)  Abdominal Pain, Generalized  (ICD-789.07) 9)  Rectal Pain  (ICD-569.42) 10)  Diverticulosis, Colon  (ICD-562.10) 11)  Erosive Esophagitis  (ICD-530.19) 12)  Abscess, Skin   (ICD-682.9) 13)  Constipation, Chronic  (ICD-564.09) 14)  Rhinitis, Allergic Nos  (ICD-477.9) 15)  Urinary Incontinence  (ICD-788.30) 16)  Hx of Symptom, Urgency, Urination  (ICD-788.63) 17)  Screening For Mlig Neop, Breast, Nos  (ICD-V76.10) 18)  Pneumonia  (ICD-486) 19)  Family History of Colon Ca 1st Degree Relative <60  (ICD-V16.0) 20)  Knee Replacement, Right, Hx of  (ICD-V43.65) 21)  Hyperlipidemia  (ICD-272.4) 22)  Helicobacter Pylori Gastritis, Hx of  (ICD-V12.79) 23)  Disorder, Lumbar Disc W/myelopathy  (ICD-722.73) 24)  Pulmonary Embolism, Hx of  (ICD-V12.51) 25)  Oophorectomy, Hx of  (ICD-V45.77) 26)  Hysterectomy, Hx of  (ICD-V45.77) 27)  Osteopenia  (ICD-733.90) 28)  Knee Replacement, Left, Hx of  (ICD-V43.65) 29)  Osteoarthritis, Multiple Joints  (ICD-715.89) 30)  Low Back Pain  (ICD-724.2) 31)  Hypertension  (ICD-401.9) 32)  Gerd  (ICD-530.81)  Medications Prior to Update: 1)  Aspirin 81 Mg Chew (Aspirin) .... Take 1 Tablet By Mouth Once A Day 2)  Hydrochlorothiazide 25 Mg Tabs (Hydrochlorothiazide) .... Take 1 Tablet By Mouth Once A Day 3)  Calcium 1500 Mg Tabs (Calcium Carbonate) .Marland KitchenMarland KitchenMarland Kitchen  Take 1 Tablet By Mouth Once A Day 4)  Vitamin D 400 Unit Tabs (Vitamin D) .... Take 2 Tabs By Mouth Once Daily 5)  Lisinopril 40 Mg Tabs (Lisinopril) .... Take 1 Tablet By Mouth Once A Day 6)  Zocor 20 Mg Tabs (Simvastatin) .... Take 1 Tablet By Mouth Once A Day 7)  Nexium 40 Mg Cpdr (Esomeprazole Magnesium) .... Take 1 Capsule By Mouth Once A Day 8)  Detrol La 4 Mg Cp24 (Tolterodine Tartrate) .... Take 1 Tablet By Mouth Once A Day 9)  Centrum Silver  Tabs (Multiple Vitamins-Minerals) .... Take 1 Tablet By Mouth Once A Day For Vitamins 10)  Morphine Sulfate 15 Mg Tabs (Morphine Sulfate) .... Take One Tablet Every 6 Hours As Needed For Breakthrough Pain 11)  Dicyclomine Hcl 10 Mg  Caps (Dicyclomine Hcl) .... Take For Abdominal Pain Up To Four Times Daily. 12)  Glucosamine-Chondroitin  1500-1200 Mg/54ml Liqd (Glucosamine-Chondroitin) .... Take 1 Tablet By Mouth Two Times A Day 13)  Morphine Sulfate Cr 30 Mg Xr12h-Tab (Morphine Sulfate) .... Take 1 Tablet By Mouth Two Times A Day  Current Medications (verified): 1)  Aspirin 81 Mg Chew (Aspirin) .... Take 1 Tablet By Mouth Once A Day 2)  Hydrochlorothiazide 25 Mg Tabs (Hydrochlorothiazide) .... Take 1 Tablet By Mouth Once A Day 3)  Calcium 1500 Mg Tabs (Calcium Carbonate) .... Take 1 Tablet By Mouth Once A Day 4)  Vitamin D 400 Unit Tabs (Vitamin D) .... Take 2 Tabs By Mouth Once Daily 5)  Lisinopril 40 Mg Tabs (Lisinopril) .... Take 1 Tablet By Mouth Once A Day 6)  Zocor 20 Mg Tabs (Simvastatin) .... Take 1 Tablet By Mouth Once A Day 7)  Nexium 40 Mg Cpdr (Esomeprazole Magnesium) .... Take 1 Capsule By Mouth Once A Day 8)  Detrol La 4 Mg Cp24 (Tolterodine Tartrate) .... Take 1 Tablet By Mouth Once A Day 9)  Centrum Silver  Tabs (Multiple Vitamins-Minerals) .... Take 1 Tablet By Mouth Once A Day For Vitamins 10)  Morphine Sulfate 15 Mg Tabs (Morphine Sulfate) .... Take One Tablet Every 6 Hours As Needed For Breakthrough Pain 11)  Dicyclomine Hcl 10 Mg  Caps (Dicyclomine Hcl) .... Take For Abdominal Pain Up To Four Times Daily. 12)  Glucosamine-Chondroitin 1500-1200 Mg/70ml Liqd (Glucosamine-Chondroitin) .... Take 1 Tablet By Mouth Two Times A Day 13)  Morphine Sulfate Cr 30 Mg Xr12h-Tab (Morphine Sulfate) .... 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 GERD-Papulous Gastropathy EGD Oct 07 Hypertension Low back pain-intermittent radiculopathy Osteoarthritis Pulmonary embolism, hx of-2/2 Knee Replacement Osteopenia (T Score -1.1 R Femur, -0.4 L Spine) Hyperlipidemia Polio in 1942 Urinary Incontinence  Past Surgical History: Reviewed history from 10/26/2005 and no changes required. Total knee replacement-Left 1995, Right 2004 Hysterectomy-1985 Oophorectomy-1985  Family History: Reviewed history from 02/02/2006 and no changes required. Family History of Colon CA 1st degree relative <60-Daughter died  Social History: Reviewed history from 03/20/2008 and no changes required. 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   Mouth:  Oral mucosa and oropharynx without lesions or exudates.  Teeth in good repair. Lungs:  Normal respiratory effort, chest expands symmetrically. Lungs are clear to auscultation, no crackles or wheezes. Heart:  Normal rate and regular rhythm. S1 and S2 normal without gallop, murmur, click, rub or other extra sounds. Abdomen:  soft and non-tender.   Pulses:  R radial normal.   Extremities:  No clubbing, cyanosis, edema, or deformity noted with normal full range of motion of all joints.   Neurologic:  alert & oriented X3, strength normal in all extremities, and gait normal.     Impression & Recommendations:  Problem # 1:  OSTEOARTHRITIS, MULTIPLE JOINTS (ICD-715.89)  Currentl complaining of Left hip pain. On very good amount of narcotics. Given her severe OA, and given that her pains not responding to these narcotics, will refer her to Orthopedics.  Her updated medication list for this  problem includes:    Aspirin 81 Mg Chew (Aspirin) .Marland Kitchen... Take 1 tablet by mouth once a day    Morphine Sulfate 15 Mg Tabs (Morphine sulfate) .Marland Kitchen... Take one tablet every 6 hours as needed for breakthrough pain    Morphine Sulfate Cr 30 Mg Xr12h-tab (Morphine sulfate) .Marland Kitchen... Take 1 tablet by mouth two times a day  Orders: Orthopedic Surgeon Referral (Ortho Surgeon)  Problem # 2:  HYPERTENSION (ICD-401.9) In the setting of significant amount of left hip pain. Looking back at her previous BP, it has always been very well controlled. Will not make any changes to regimen at this point.  Her updated medication list for this problem includes:    Hydrochlorothiazide 25 Mg Tabs (Hydrochlorothiazide) .Marland Kitchen... Take 1 tablet by mouth once a day    Lisinopril 40 Mg Tabs (Lisinopril) .Marland Kitchen... Take 1 tablet by mouth once a day  Problem # 3:  HYPERLIPIDEMIA (ICD-272.4)  Will check lipid pannel.  Her updated medication list for this problem includes:    Zocor 20 Mg Tabs (Simvastatin) .Marland Kitchen... Take 1 tablet by mouth once a day  Labs Reviewed: SGOT: 16 (07/28/2008)   SGPT: 13 (07/28/2008)  Lipid Goals: Chol Goal: 200 (03/20/2008)   HDL Goal: 40 (03/20/2008)   LDL Goal: 160 (03/20/2008)   TG Goal: 150 (03/20/2008)  Prior 10 Yr Risk Heart Disease: 3 % (03/20/2008)   HDL:87 (07/28/2008), 87 (03/21/2008)  LDL:78 (07/28/2008), 79 (03/21/2008)  Chol:185 (07/28/2008), 179 (03/21/2008)  Trig:98 (07/28/2008), 65 (03/21/2008)  Orders: T-Lipid Profile ([id number])  Complete Medication List: 1)  Aspirin 81 Mg Chew (Aspirin) .... Take 1 tablet by mouth once a day 2)  Hydrochlorothiazide 25 Mg Tabs (Hydrochlorothiazide) .... Take 1 tablet by mouth once a day 3)  Calcium 1500 Mg Tabs (Calcium carbonate) .... Take 1 tablet by mouth once a day 4)  Vitamin D 400 Unit Tabs (Vitamin d) .... Take 2 tabs by mouth once daily 5)  Lisinopril 40 Mg Tabs (Lisinopril) .... Take 1 tablet by mouth once a day 6)  Zocor 20 Mg Tabs  (Simvastatin) .... Take 1 tablet by mouth once a day 7)  Nexium 40 Mg Cpdr (Esomeprazole magnesium) .... Take 1 capsule by mouth once a day 8)  Detrol La 4 Mg Cp24 (Tolterodine tartrate) .... Take 1 tablet by mouth once a day 9)  Centrum Silver  Tabs (Multiple vitamins-minerals) .... Take 1 tablet by mouth once a day for vitamins 10)  Morphine Sulfate 15 Mg Tabs (Morphine sulfate) .... Take one tablet every 6 hours as needed for breakthrough pain 11)  Dicyclomine Hcl 10 Mg Caps (Dicyclomine hcl) .... Take for abdominal pain up to four times daily. 12)  Glucosamine-chondroitin 1500-1200 Mg/78ml Liqd (Glucosamine-chondroitin) .... Take 1 tablet by mouth two times a day 13)  Morphine Sulfate Cr 30 Mg Xr12h-tab (Morphine sulfate) .... Take 1 tablet by mouth two times a day  Patient Instructions: 1)  Please schedule a follow-up appointment in 3 months. 2)  Take all the medications as advised below. Process Orders Check Orders Results:     [facility]: Check successful Tests Sent for requisitioning (May 11, 2009 3:10 PM):     05/11/2009: [facility] -- T-Lipid Profile [phone] (signed)    Prevention & Chronic Care Immunizations   Influenza vaccine: already had '08  (12/04/2006)   Influenza vaccine deferral: Not indicated  (11/04/2008)   Influenza vaccine due: 09/10/2008    Tetanus booster: Not documented   Td booster deferral: Deferred  (07/28/2008)    Pneumococcal vaccine: Not documented   Pneumococcal vaccine deferral: Deferred  (07/28/2008)    H. zoster vaccine: Not documented   H. zoster vaccine deferral: Deferred  (07/28/2008)  Colorectal Screening   Hemoccult: Not documented   Hemoccult action/deferral: Deferred  (11/04/2008)    Colonoscopy: Normal  (02/27/2002)  Other Screening   Pap smear: Not documented   Pap smear action/deferral: Not indicated S/P hysterectomy  (11/04/2008)    Mammogram: ASSESSMENT: Negative - BI-RADS 1^MM DIGITAL  SCREENING  (05/08/2008)    DXA bone density scan: Lumbar Spine:  T Score > -1.0 Spine.  Hip Total: T Score -2.5 to -1.0 Hip.   Osteopenia    (05/01/2007)   DXA scan due: 05/2009    Smoking status: quit  (05/11/2009)  Lipids   Total Cholesterol: 185  (07/28/2008)   Lipid panel action/deferral: Lipid Panel ordered   LDL: 78  (07/28/2008)   LDL Direct: Not documented   HDL: 87  (07/28/2008)   Triglycerides: 98  (07/28/2008)    SGOT (AST): 16  (07/28/2008)   SGPT (ALT): 13  (07/28/2008)   Alkaline phosphatase: 77  (07/28/2008)   Total bilirubin: 0.4  (07/28/2008)  Hypertension   Last Blood Pressure: 163 / 80  (05/11/2009)   Serum creatinine: 1.03  (07/28/2008)   Serum potassium 4.4  (07/28/2008)  Self-Management Support :   Personal Goals (by the next clinic visit) :      Personal blood pressure goal: 140/90  (05/11/2009)     Personal LDL goal: 100  (05/11/2009)    Patient will work on the following items until the next clinic visit to reach self-care goals:     Medications and monitoring: take my medicines every day  (05/11/2009)     Eating: drink diet soda or water instead of juice or soda, eat more vegetables, use fresh or frozen vegetables, eat foods that are low in salt, eat baked foods instead of fried foods, eat fruit for snacks and desserts, limit or avoid alcohol  (05/11/2009)     Activity: take a 30 minute walk every day  (05/11/2009)    Hypertension self-management support: Resources for patients handout  (05/11/2009)    Lipid self-management support: Resources for patients handout  (05/11/2009)        Resource handout printed.
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
## 2010-02-09 NOTE — Progress Notes (Signed)
  Phone Note Outgoing Call   Call placed by: Theotis Barrio NT II,  May 25, 2009 2:32 PM Call placed to: Patient Details for Reason: ORTHOPEDIC APPT Summary of Call: UNABLE TO CONTACT PATIENT BY PHONE TO GIVE HER THE ORTHO APPT .   [facility] /// DR Renato Gails  /  [phone] MAY 19, 011 @  10:15AM /  WILL TRY AGAIN TO CALL THIS PATIENT. Kimball Appleby NT II  May 25, 2009 2:34 PM

## 2010-02-09 NOTE — Progress Notes (Signed)
Summary: Refill/gh  Phone Note Refill Request Message from:  Patient on April 14, 2009 11:34 AM  Refills Requested: Medication #1:  MORPHINE SULFATE 15 MG TABS Take one tablet every 6 hours as needed for breakthrough pain  Method Requested: Electronic Initial call taken by: Angelina Ok RN,  April 14, 2009 11:35 AM  Follow-up for Phone Call        Refill approved-nurse to complete Follow-up by: Blondell Reveal MD,  April 15, 2009 2:10 PM  Additional Follow-up for Phone Call Additional follow up Details #1::        Pt  to pick up prescription. Additional Follow-up by: Angelina Ok RN,  April 14, 2009 11:35 AM    Prescriptions: MORPHINE SULFATE 15 MG TABS (MORPHINE SULFATE) Take one tablet every 6 hours as needed for breakthrough pain  #0 x 0   Entered and Authorized by:   Ulyess Mort MD   Signed by:   Ulyess Mort MD on 04/17/2009   Method used:   Print then Give to Patient   RxID:   [id number]

## 2010-02-09 NOTE — Progress Notes (Signed)
  Phone Note Outgoing Call   Call placed by: Theotis Barrio NT II,  May 26, 2009 11:13 AM Call placed to: Patient Details for Reason: ORTHO REFERRAL APPT Summary of Call: CALLED PATIENT TO GIVE HER THE ORTHO APPT.  PHONE NUMBER IS TEMP. OUT OF SERVICE. Marland Kitchen WILL CALL ORTHO AND RESCHEDULE THIS APPT TO LATER DATE, BECAUSE I AM UNABLE TO GET IN CONTACT WITH THIS PATIENT.Lela Sturdivant NT II  May 26, 2009 11:15 AM

## 2010-02-09 NOTE — Progress Notes (Signed)
Summary: refill/gg  Phone Note Refill Request  on March 10, 2009 10:56 AM   Medication #2:  MORPHINE SULFATE CR 30 MG XR12H-TAB Take 1 tablet by mouth two times a day.   Last Refilled: 02/09/2009 Pt # [phone]   Method Requested: Pick up at Office Initial call taken by: Merrie Roof RN,  March 10, 2009 10:57 AM  Additional Follow-up for Phone Call Additional follow up Details #1::        Pt informed Rx ready Additional Follow-up by: Merrie Roof RN,  March 11, 2009 3:26 PM    Prescriptions: MORPHINE SULFATE CR 30 MG XR12H-TAB (MORPHINE SULFATE) Take 1 tablet by mouth two times a day  #60 x 0   Entered and Authorized by:   Clydie Braun MD   Signed by:   Clydie Braun MD on 03/11/2009   Method used:   Print then Give to Patient   RxID:   [id number]

## 2010-02-09 NOTE — Medication Information (Signed)
Summary: MORPHINE  MORPHINE   Imported By: Margie Billet 12/15/2009 09:32:27  _____________________________________________________________________  External Attachment:    Type:   Image     Comment:   External Document

## 2010-02-09 NOTE — Procedures (Signed)
Summary: Colonoscopy  Patient: Jennifer Owen Note: All result statuses are Final unless otherwise noted.  Tests: (1) Colonoscopy (COL)   COL Colonoscopy           DONE     [facility]     [address]     [place], [place]  [address]           COLONOSCOPY PROCEDURE REPORT           PATIENT:  [name], [name]  MR#:  [id number]     BIRTHDATE:  [birth year], 78 yrs. old  GENDER:  female     ENDOSCOPIST:  Hedwig Morton. Juanda Chance, MD     REF. BY:     PROCEDURE DATE:  08/27/2009     PROCEDURE:  Colonoscopy 19147     ASA CLASS:  Class II     INDICATIONS:  Routine Risk Screening colon 1998-diverticulosis     MEDICATIONS:   Versed 8 mg, Fentanyl 75 mcg           DESCRIPTION OF PROCEDURE:   After the risks benefits and     alternatives of the procedure were thoroughly explained, informed     consent was obtained.  Digital rectal exam was performed and     revealed no rectal masses.   The LB [id number]-[id number] endoscope     was introduced through the anus and advanced to the cecum, which     was identified by both the appendix and ileocecal valve, without     limitations.  The quality of the prep was good, using MiraLax.     The instrument was then slowly withdrawn as the colon was fully     examined.     <<PROCEDUREIMAGES>>           FINDINGS:  Moderate diverticulosis was found in the sigmoid colon     (see image2, image1, image5, and image6).  This was otherwise a     normal examination of the colon (see image7, image4, and image3).     Retroflexed views in the rectum revealed no abnormalities.    The     scope was then withdrawn from the patient and the procedure     completed.           COMPLICATIONS:  None     ENDOSCOPIC IMPRESSION:     1) Moderate diverticulosis in the sigmoid colon     2) Otherwise normal examination     3) Normal colonoscopy     RECOMMENDATIONS:     1) high fiber diet     Metamucis 1 tsp po qd     REPEAT EXAM:  In 10 year(s) for.        ______________________________     Hedwig Morton. Juanda Chance, MD           CC:           n.     eSIGNED:   Hedwig Morton. Romaldo Saville at 08/27/2009 11:33 AM           Earney Hamburg, [phone]  Note: An exclamation mark (!) indicates a result that was not dispersed into the flowsheet. Document Creation Date: 08/27/2009 11:35 AM _______________________________________________________________________  (1) Order result status: Final Collection or observation date-time: 08/27/2009 11:23 Requested date-time:  Receipt date-time:  Reported date-time:  Referring Physician:   Ordering Physician: Lina Sar [id number]) Specimen Source:  Source: Launa Grill Order Number: [id number] Lab site:   Appended Document: Colonoscopy    Clinical Lists Changes  Observations: Added new observation of COLONNXTDUE: 08/2019 (08/27/2009 12:55)

## 2010-02-09 NOTE — Progress Notes (Signed)
  Phone Note Outgoing Call   Call placed by: Zoila Shutter MD,  July 11, 2009 10:27 AM Call placed to: Patient Summary of Call: I called to check on Ms. Yasin. She says that she is doing much better this morning. The SL hyosamine has helped her abdominal pain and the phenergan has taken away her nausea. She is tolerating by mouth intake and had some soup this AM. She has also taken her Kdur and plans to go get some bananas today.

## 2010-02-09 NOTE — Assessment & Plan Note (Signed)
Summary: EST-CK/FU/MEDS/CFB   Vital Signs:  Patient profile:   77 year old female Height:      64 inches Weight:      198.0 pounds BMI:     34.11 O2 Sat:      100 % on Room air Temp:     97.3 degrees F oral Pulse rate:   75 / minute BP sitting:   127 / 69  (right arm)  Vitals Entered By: Filomena Jungling NT II (October 09, 2009 4:03 PM)  O2 Flow:  Room air CC: coldx 5 dAYS, NO FEVERBUT NASAL STUFFINESS/ NEED SOME Is Patient Diabetic? No Pain Assessment Patient in pain? yes     Location: JOINTS Intensity: 5 Type: aching Onset of pain  Chronic Nutritional Status BMI of > 30 = obese  Does patient need assistance? Functional Status Self care   Primary Care Provider:  Blondell Reveal MD  CC:  coldx 5 dAYS and NO FEVERBUT NASAL STUFFINESS/ NEED SOME.  History of Present Illness: 77 year old lady with PMH as mentioned in the EMR comes to the office for a follow up visit.   Patient reports that she is coming from the gym and reports that she does aerobics at the gym. She goes to the gym three times a week.  Patient had colonsocpy recenly which was negative for any polyps but revealed diverticuli.  She denies any problems currently.  Preventive Screening-Counseling & Management  Alcohol-Tobacco     Smoking Status: quit     Year Quit: 30 years ago  Caffeine-Diet-Exercise     Does Patient Exercise: yes     Type of exercise: WALKING     Times/week: 2  Current Medications (verified): 1)  Aspirin 81 Mg Chew (Aspirin) .... Take 1 Tablet By Mouth Once A Day 2)  Hydrochlorothiazide 25 Mg Tabs (Hydrochlorothiazide) .... Take 1 Tablet By Mouth Once A Day 3)  Calcium 1500 Mg Tabs (Calcium Carbonate) .... Take 1 Tablet By Mouth Once A Day 4)  Vitamin D 400 Unit Tabs (Vitamin D) .... Take 2 Tabs By Mouth Once Daily 5)  Lisinopril 40 Mg Tabs (Lisinopril) .... Take 1 Tablet By Mouth Once A Day 6)  Zocor 20 Mg Tabs (Simvastatin) .... Take 1 Tablet By Mouth Once A Day 7)  Nexium 40  Mg Cpdr (Esomeprazole Magnesium) .... Take 1 Capsule By Mouth Once A Day 8)  Detrol La 4 Mg Cp24 (Tolterodine Tartrate) .... Take 1 Tablet By Mouth Once A Day 9)  Centrum Silver  Tabs (Multiple Vitamins-Minerals) .... Take 1 Tablet By Mouth Once A Day For Vitamins 10)  Dicyclomine Hcl 10 Mg  Caps (Dicyclomine Hcl) .... Take For Abdominal Pain Up To Four Times Daily. 11)  Glucosamine-Chondroitin 1500-1200 Mg/51ml Liqd (Glucosamine-Chondroitin) .... Take 1 Tablet By Mouth Two Times A Day 12)  Morphine Sulfate Cr 30 Mg Xr12h-Tab (Morphine Sulfate) .... Take 1 Tablet By Mouth Two Times A Day 13)  Citalopram Hydrobromide 20 Mg Tabs (Citalopram Hydrobromide) .... Take 1 Tablet By Mouth Once A Day 14)  Promethazine Hcl 25 Mg Tabs (Promethazine Hcl) .Marland Kitchen.. 1 Tab Every 4 Hrs As Needed For Nausea/vomiting 15)  Hyoscyamine Sulfate 0.125 Mg Subl (Hyoscyamine Sulfate) .... One To Two Pills Under Your Tongue Up To 4 Times A Day A Needed 16)  Potassium Chloride Crys Cr 20 Meq Cr-Tabs (Potassium Chloride Crys Cr) .... One A Day  Allergies: 1)  Tramadol Hcl  Review of Systems      See HPI  Physical Exam  General:  alert, well-developed, and well-nourished.   Neck:  supple and full ROM.   Lungs:  normal respiratory effort, no intercostal retractions, no accessory muscle use, and normal breath sounds.   Heart:  normal rate, regular rhythm, no murmur, and no gallop.   Abdomen:  soft, non-tender, and normal bowel sounds.   Pulses:  R radial normal.   Extremities:  No clubbing, cyanosis, edema, or deformity noted with normal full range of motion of all joints.   Neurologic:  alert & oriented X3, cranial nerves II-XII intact, and gait normal.     Impression & Recommendations:  Problem # 1:  HYPERTENSION (ICD-401.9) Well controlled. Continue current regimen.  Her updated medication list for this problem includes:    Hydrochlorothiazide 25 Mg Tabs (Hydrochlorothiazide) .Marland Kitchen... Take 1 tablet by mouth once a  day    Lisinopril 40 Mg Tabs (Lisinopril) .Marland Kitchen... Take 1 tablet by mouth once a day  BP today: 127/69 Prior BP: 142/84 (07/20/2009)  Prior 10 Yr Risk Heart Disease: 3 % (03/20/2008)  Labs Reviewed: K+: 3.6 (07/20/2009) Creat: : 1.00 (07/20/2009)   Chol: 184 (05/11/2009)   HDL: 91 (05/11/2009)   LDL: 66 (05/11/2009)   TG: 136 (05/11/2009)  Problem # 2:  IRRITABLE BOWEL SYNDROME (ICD-564.1) Had colonoscopy which revealed diverticuli otherwise negative.   Problem # 3:  OSTEOARTHRITIS, MULTIPLE JOINTS (ICD-715.89) Patient reports that she stopped taking her Morphine for quite sometime. Will restart.  Her updated medication list for this problem includes:    Aspirin 81 Mg Chew (Aspirin) .Marland Kitchen... Take 1 tablet by mouth once a day    Morphine Sulfate Cr 30 Mg Xr12h-tab (Morphine sulfate) .Marland Kitchen... Take 1 tablet by mouth two times a day  Complete Medication List: 1)  Aspirin 81 Mg Chew (Aspirin) .... Take 1 tablet by mouth once a day 2)  Hydrochlorothiazide 25 Mg Tabs (Hydrochlorothiazide) .... Take 1 tablet by mouth once a day 3)  Lisinopril 40 Mg Tabs (Lisinopril) .... Take 1 tablet by mouth once a day 4)  Zocor 20 Mg Tabs (Simvastatin) .... Take 1 tablet by mouth once a day 5)  Nexium 40 Mg Cpdr (Esomeprazole magnesium) .... Take 1 capsule by mouth once a day 6)  Centrum Silver Tabs (Multiple vitamins-minerals) .... Take 1 tablet by mouth once a day for vitamins 7)  Glucosamine-chondroitin 1500-1200 Mg/38ml Liqd (Glucosamine-chondroitin) .... Take 1 tablet by mouth two times a day 8)  Morphine Sulfate Cr 30 Mg Xr12h-tab (Morphine sulfate) .... Take 1 tablet by mouth two times a day  Patient Instructions: 1)  Please schedule a follow-up appointment in 3 months. Prescriptions: MORPHINE SULFATE CR 30 MG XR12H-TAB (MORPHINE SULFATE) Take 1 tablet by mouth two times a day  #60 x 0   Entered and Authorized by:   Blondell Reveal MD   Signed by:   Blondell Reveal MD on 10/09/2009   Method used:   Print  then Give to Patient   RxID:   [id number]   Prevention & Chronic Care Immunizations   Influenza vaccine: already had '08  (12/04/2006)   Influenza vaccine deferral: Contraindicated  (10/09/2009)   Influenza vaccine due: 09/10/2008    Tetanus booster: Not documented   Td booster deferral: Refused  (07/20/2009)    Pneumococcal vaccine: Not documented   Pneumococcal vaccine deferral: Contraindicated  (10/09/2009)    H. zoster vaccine: Not documented   H. zoster vaccine deferral: Refused  (07/20/2009)    Immunization comments: patient has cold currently  Colorectal  Screening   Hemoccult: Not documented   Hemoccult action/deferral: Not indicated  (07/20/2009)    Colonoscopy: DONE  (08/27/2009)   Colonoscopy action/deferral: GI referral  (07/20/2009)   Colonoscopy due: 08/2019  Other Screening   Pap smear: Not documented   Pap smear action/deferral: Not indicated S/P hysterectomy  (11/04/2008)    Mammogram: ASSESSMENT: Negative - BI-RADS 1^MM DIGITAL SCREENING  (05/29/2009)   Mammogram action/deferral: Not indicated  (07/20/2009)    DXA bone density scan: Lumbar Spine:  T Score > -1.0 Spine.  Hip Total: T Score -2.5 to -1.0 Hip.   Osteopenia    (05/01/2007)   DXA bone density action/deferral: Deferred  (07/06/2009)   DXA scan due: 05/2009    Smoking status: quit  (10/09/2009)  Lipids   Total Cholesterol: 184  (05/11/2009)   Lipid panel action/deferral: Lipid Panel ordered   LDL: 66  (05/11/2009)   LDL Direct: Not documented   HDL: 91  (05/11/2009)   Triglycerides: 136  (05/11/2009)    SGOT (AST): 20  (07/10/2009)   SGPT (ALT): 19  (07/10/2009)   Alkaline phosphatase: 82  (07/10/2009)   Total bilirubin: 0.6  (07/10/2009)  Hypertension   Last Blood Pressure: 127 / 69  (10/09/2009)   Serum creatinine: 1.00  (07/20/2009)   Serum potassium 3.6  (07/20/2009)  Self-Management Support :   Personal Goals (by the next clinic visit) :      Personal blood  pressure goal: 140/90  (05/11/2009)     Personal LDL goal: 100  (05/11/2009)    Patient will work on the following items until the next clinic visit to reach self-care goals:     Medications and monitoring: take my medicines every day, bring all of my medications to every visit  (10/09/2009)     Eating: drink diet soda or water instead of juice or soda, eat more vegetables, use fresh or frozen vegetables, eat foods that are low in salt, eat baked foods instead of fried foods, eat fruit for snacks and desserts, limit or avoid alcohol  (07/20/2009)     Activity: take a 30 minute walk every day  (07/20/2009)    Hypertension self-management support: Resources for patients handout  (07/20/2009)    Lipid self-management support: Resources for patients handout  (07/20/2009)

## 2010-02-09 NOTE — Medication Information (Signed)
Summary: MORPHINE SULFATE  MORPHINE SULFATE   Imported By: Margie Billet 05/25/2009 15:22:06  _____________________________________________________________________  External Attachment:    Type:   Image     Comment:   External Document

## 2010-02-09 NOTE — Consult Note (Signed)
Summary: [facility]  [facility]   Imported By: Louretta Parma 12/10/2009 09:22:33  _____________________________________________________________________  External Attachment:    Type:   Image     Comment:   External Document

## 2010-02-12 ENCOUNTER — Ambulatory Visit: Payer: Self-pay | Admitting: Internal Medicine

## 2010-02-12 ENCOUNTER — Ambulatory Visit: Admit: 2010-02-12 | Payer: Self-pay

## 2010-02-12 NOTE — Letter (Signed)
Summary: REMINDER /REFERRAL APPT.  REMINDER /REFERRAL APPT.   Imported By: Margie Billet 05/27/2009 10:07:08  _____________________________________________________________________  External Attachment:    Type:   Image     Comment:   External Document

## 2010-02-18 ENCOUNTER — Ambulatory Visit: Payer: Self-pay | Admitting: Internal Medicine

## 2010-02-22 ENCOUNTER — Other Ambulatory Visit: Payer: Self-pay | Admitting: Internal Medicine

## 2010-02-22 ENCOUNTER — Other Ambulatory Visit: Payer: Self-pay | Admitting: *Deleted

## 2010-02-22 DIAGNOSIS — G8929 Other chronic pain: Secondary | ICD-10-CM

## 2010-02-22 MED ORDER — MORPHINE SULFATE CR 30 MG PO TB12: 30.0000 mg | ORAL_TABLET | Freq: Two times a day (BID) | ORAL | Status: DC

## 2010-02-22 MED ORDER — MORPHINE SULFATE 30 MG PO TABS: 30.0000 mg | ORAL_TABLET | Freq: Two times a day (BID) | ORAL | Status: DC

## 2010-02-22 NOTE — Telephone Encounter (Signed)
Last refill 12/2 for # 60

## 2010-02-22 NOTE — Telephone Encounter (Signed)
Rx for morphine CR given to pt

## 2010-02-22 NOTE — Telephone Encounter (Signed)
Patient uses only prn for arthritis pain. Bring pill bottle in today for verification. Intolerant to hydrocodone and oxycodone.

## 2010-02-22 NOTE — Progress Notes (Signed)
Medication Rec done- [facility] changed to [facility]. Patient given proper script.

## 2010-03-03 ENCOUNTER — Ambulatory Visit (INDEPENDENT_AMBULATORY_CARE_PROVIDER_SITE_OTHER): Payer: Medicare Other | Admitting: Internal Medicine

## 2010-03-03 ENCOUNTER — Encounter: Payer: Self-pay | Admitting: Internal Medicine

## 2010-03-03 VITALS — BP 139/62 | HR 56 | Temp 97.3°F | Ht 64.0 in | Wt 200.5 lb

## 2010-03-03 DIAGNOSIS — N390 Urinary tract infection, site not specified: Secondary | ICD-10-CM

## 2010-03-03 LAB — POCT URINALYSIS DIPSTICK
Bilirubin, UA: NEGATIVE
Blood, UA: NEGATIVE
Ketones, UA: NEGATIVE
Nitrite, UA: NEGATIVE
pH: 6

## 2010-03-03 MED ORDER — NITROFURANTOIN MACROCRYSTAL 100 MG PO CAPS: 100.0000 mg | ORAL_CAPSULE | Freq: Two times a day (BID) | ORAL | Status: AC

## 2010-03-03 NOTE — Progress Notes (Signed)
Addended by: Donia Guiles on: 03/03/2010 02:47 PM   Modules accepted: Orders

## 2010-03-03 NOTE — Progress Notes (Signed)
  Subjective:    Patient ID: Jennifer Owen, female    DOB: [birth year], 77 y.o.   MRN: [id number]  HPI Here with focal problem of urinary frequency without dysuria or hematuria    Review of Systems As above    Objective:   Physical Exam    no cvat appears well    Assessment & Plan:   UA positive-will send for culture and rx with Macrodantin

## 2010-03-03 NOTE — Progress Notes (Signed)
Addended byChinita Pester on: 03/03/2010 02:49 PM   Modules accepted: Orders

## 2010-03-06 LAB — URINE CULTURE: Colony Count: 35000

## 2010-03-23 ENCOUNTER — Other Ambulatory Visit: Payer: Self-pay | Admitting: *Deleted

## 2010-03-23 MED ORDER — MORPHINE SULFATE CR 30 MG PO TB12: 30.0000 mg | ORAL_TABLET | Freq: Two times a day (BID) | ORAL | Status: AC

## 2010-03-28 LAB — URINE CULTURE

## 2010-03-28 LAB — DIFFERENTIAL
Eosinophils Relative: 2 % (ref 0–5)
Monocytes Relative: 9 % (ref 3–12)
Neutrophils Relative %: 56 % (ref 43–77)

## 2010-03-28 LAB — COMPREHENSIVE METABOLIC PANEL
AST: 23 U/L (ref 0–37)
Albumin: 3.9 g/dL (ref 3.5–5.2)
Alkaline Phosphatase: 77 U/L (ref 39–117)
Chloride: 108 mEq/L (ref 96–112)
GFR calc Af Amer: >60 mL/min (ref >60)
Potassium: 3.8 mEq/L (ref 3.5–5.1)
Sodium: 144 mEq/L (ref 135–145)
Total Bilirubin: 0.8 mg/dL (ref 0.3–1.2)

## 2010-03-28 LAB — URINALYSIS, ROUTINE W REFLEX MICROSCOPIC
Bilirubin Urine: NEGATIVE
Hgb urine dipstick: NEGATIVE
Nitrite: NEGATIVE
Specific Gravity, Urine: 1.017 (ref 1.005–1.030)
pH: 6 (ref 5.0–8.0)

## 2010-03-28 LAB — CBC
HCT: 42.2 % (ref 36.0–46.0)
Hemoglobin: 14.1 g/dL (ref 12.0–15.0)
MCHC: 33.5 g/dL (ref 30.0–36.0)

## 2010-03-28 LAB — CLOSTRIDIUM DIFFICILE EIA: C difficile Toxins A+B, EIA: NEGATIVE

## 2010-03-28 LAB — GIARDIA/CRYPTOSPORIDIUM SCREEN(EIA)

## 2010-03-28 LAB — URINE MICROSCOPIC-ADD ON

## 2010-04-21 ENCOUNTER — Encounter: Payer: Self-pay | Admitting: Internal Medicine

## 2010-04-21 ENCOUNTER — Ambulatory Visit (INDEPENDENT_AMBULATORY_CARE_PROVIDER_SITE_OTHER): Payer: Medicare Other | Admitting: Internal Medicine

## 2010-04-21 DIAGNOSIS — R6889 Other general symptoms and signs: Secondary | ICD-10-CM

## 2010-04-21 DIAGNOSIS — Z0000 Encounter for general adult medical examination without abnormal findings: Secondary | ICD-10-CM

## 2010-04-21 DIAGNOSIS — Z23 Encounter for immunization: Secondary | ICD-10-CM

## 2010-04-21 LAB — RPR

## 2010-04-21 NOTE — Assessment & Plan Note (Signed)
Patient was on a trip and forgot ledger, does not recall where she left it. Complains of light change term memory and also heat sensitivity. This may be due to her thyroid issues versus B12 deficiency. Plan is to screen with a TSH free T4, vitamin B12 level, RPR. If all these are negative and patient continues to have progressive symptoms Mini-Mental exam will be tested to see the patient is displaying signs of Alzheimer's.

## 2010-04-21 NOTE — Patient Instructions (Signed)
Arthritis - Nonspecific Arthritis is pain, redness, warmth, or puffiness (swelling) of a joint. The joint may be stiff or hurt when you move it. One or more joints may be affected. There are many types of arthritis. Your doctor may not know what type you have right away. The most common cause of arthritis is wear and tear on the joint (osteoarthritis). HOME CARE  Only take medicine as told by your doctor.   Rest the joint as much as possible.   If your joint is puffy, keep it raised (elevated).   Cold packs may be used for very bad joint pain, 10 to 15 minutes every hour. Ask your doctor if it is okay for you to use hot packs.   Morning stiffness is often helped by a warm shower.   Use crutches if the painful joint is in your leg.   Drink enough water and fluids to keep your pee (urine) clear or pale yellow.   Follow your doctor's instructions for diet and physical activity.  GET HELP RIGHT AWAY IF:  You have a temperature by mouth above 101, not controlled by medicine.   You have very bad joint pain, puffiness, or redness.   Many joints become painful and puffy.   There is very bad back pain or leg weakness.   You cannot control when you poop (bowel movement) or pee (urinate).  MAKE SURE YOU:  Understand these instructions.   Will watch your condition.   Will get help right away if you are not doing well or get worse.  Document Released: 03/23/2009  [facility] Patient Information 2011 [place], [place].

## 2010-04-21 NOTE — Progress Notes (Signed)
  Subjective:    Patient ID: Jennifer Owen, female    DOB: [birth year], 77 y.o.   MRN: [id number]  HPI Patient is 77 year old female with a past medical history listed below, presents to outpatient clinic complaining of forgetfulness and increased chills and temperature sensitivity. She's worried that she might be developing Alzheimer's. She has no history of hyperthyroidism or B12 deficiency. Denies any other complaints, is compliant with all of her medications.   Review of Systems  [all other systems reviewed and are negative       Objective:   Physical Exam  Constitutional: She is oriented to person, place, and time. She appears well-developed and well-nourished.  HENT:  Head: Normocephalic.  Eyes: Pupils are equal, round, and reactive to light.  Neck: Normal range of motion. Neck supple. No JVD present.  Cardiovascular: Normal rate and regular rhythm.   Pulmonary/Chest: Effort normal and breath sounds normal. She has no wheezes. She has no rales.  Abdominal: Soft. Bowel sounds are normal.  Musculoskeletal: Normal range of motion.  Neurological: She is alert and oriented to person, place, and time.          Assessment & Plan:

## 2010-04-27 ENCOUNTER — Other Ambulatory Visit (HOSPITAL_COMMUNITY): Payer: Self-pay | Admitting: Obstetrics and Gynecology

## 2010-04-27 DIAGNOSIS — Z1231 Encounter for screening mammogram for malignant neoplasm of breast: Secondary | ICD-10-CM

## 2010-04-29 ENCOUNTER — Emergency Department (HOSPITAL_COMMUNITY)
Admission: EM | Admit: 2010-04-29 | Discharge: 2010-04-29 | Disposition: A | Discharge status: Home / Self Care | Payer: Medicare Other | Attending: Emergency Medicine | Admitting: Emergency Medicine

## 2010-04-29 ENCOUNTER — Emergency Department (HOSPITAL_COMMUNITY): Payer: Medicare Other

## 2010-04-29 DIAGNOSIS — R109 Unspecified abdominal pain: Secondary | ICD-10-CM | POA: Insufficient documentation

## 2010-04-29 DIAGNOSIS — R6883 Chills (without fever): Secondary | ICD-10-CM | POA: Insufficient documentation

## 2010-04-29 DIAGNOSIS — R197 Diarrhea, unspecified: Secondary | ICD-10-CM | POA: Insufficient documentation

## 2010-04-29 DIAGNOSIS — K589 Irritable bowel syndrome without diarrhea: Secondary | ICD-10-CM | POA: Insufficient documentation

## 2010-04-29 DIAGNOSIS — K5730 Diverticulosis of large intestine without perforation or abscess without bleeding: Secondary | ICD-10-CM | POA: Insufficient documentation

## 2010-04-29 DIAGNOSIS — N390 Urinary tract infection, site not specified: Secondary | ICD-10-CM | POA: Insufficient documentation

## 2010-04-29 DIAGNOSIS — K219 Gastro-esophageal reflux disease without esophagitis: Secondary | ICD-10-CM | POA: Insufficient documentation

## 2010-04-29 DIAGNOSIS — I10 Essential (primary) hypertension: Secondary | ICD-10-CM | POA: Insufficient documentation

## 2010-04-29 DIAGNOSIS — I498 Other specified cardiac arrhythmias: Secondary | ICD-10-CM | POA: Insufficient documentation

## 2010-04-29 LAB — URINE MICROSCOPIC-ADD ON

## 2010-04-29 LAB — POCT CARDIAC MARKERS
CKMB, poc: <1 ng/mL — ABNORMAL LOW (ref 1.0–8.0)
Myoglobin, poc: 78.9 ng/mL (ref 12–200)
Troponin i, poc: <0.05 ng/mL (ref 0.00–0.09)

## 2010-04-29 LAB — DIFFERENTIAL
Basophils Absolute: 0.1 10*3/uL (ref 0.0–0.1)
Lymphocytes Relative: 26 % (ref 12–46)
Monocytes Absolute: 0.5 10*3/uL (ref 0.1–1.0)
Monocytes Relative: 7 % (ref 3–12)
Neutro Abs: 4.9 10*3/uL (ref 1.7–7.7)

## 2010-04-29 LAB — COMPREHENSIVE METABOLIC PANEL
ALT: 17 U/L (ref 0–35)
AST: 24 U/L (ref 0–37)
Albumin: 3.3 g/dL — ABNORMAL LOW (ref 3.5–5.2)
CO2: 25 mEq/L (ref 19–32)
Calcium: 9.9 mg/dL (ref 8.4–10.5)
GFR calc Af Amer: >60 mL/min (ref >60)
Sodium: 141 mEq/L (ref 135–145)
Total Protein: 6.4 g/dL (ref 6.0–8.3)

## 2010-04-29 LAB — URINALYSIS, ROUTINE W REFLEX MICROSCOPIC
Bilirubin Urine: NEGATIVE
Glucose, UA: NEGATIVE mg/dL
Hgb urine dipstick: NEGATIVE
Specific Gravity, Urine: 1.014 (ref 1.005–1.030)
pH: 6 (ref 5.0–8.0)

## 2010-04-29 LAB — CBC
HCT: 35.7 % — ABNORMAL LOW (ref 36.0–46.0)
Hemoglobin: 12.2 g/dL (ref 12.0–15.0)
MCHC: 34.2 g/dL (ref 30.0–36.0)

## 2010-04-29 MED ORDER — IOHEXOL 300 MG/ML  SOLN
100.0000 mL | Freq: Once | INTRAMUSCULAR | Status: AC | PRN
  Administered 2010-04-29: 60 mL via INTRAVENOUS

## 2010-05-03 ENCOUNTER — Inpatient Hospital Stay (HOSPITAL_COMMUNITY): Payer: Medicare Other

## 2010-05-03 ENCOUNTER — Encounter: Payer: Self-pay | Admitting: Internal Medicine

## 2010-05-03 ENCOUNTER — Ambulatory Visit (INDEPENDENT_AMBULATORY_CARE_PROVIDER_SITE_OTHER): Payer: Medicare Other | Admitting: Internal Medicine

## 2010-05-03 ENCOUNTER — Inpatient Hospital Stay (HOSPITAL_COMMUNITY)
Admission: AD | Admit: 2010-05-03 | Discharge: 2010-05-07 | DRG: 392 | Disposition: A | Discharge status: Home / Self Care | Payer: Medicare Other | Source: Ambulatory Visit | Attending: Infectious Diseases | Admitting: Infectious Diseases

## 2010-05-03 ENCOUNTER — Telehealth: Payer: Self-pay | Admitting: *Deleted

## 2010-05-03 DIAGNOSIS — K5730 Diverticulosis of large intestine without perforation or abscess without bleeding: Secondary | ICD-10-CM

## 2010-05-03 DIAGNOSIS — Z8612 Personal history of poliomyelitis: Secondary | ICD-10-CM

## 2010-05-03 DIAGNOSIS — I10 Essential (primary) hypertension: Secondary | ICD-10-CM | POA: Diagnosis present

## 2010-05-03 DIAGNOSIS — Z800 Family history of malignant neoplasm of digestive organs: Secondary | ICD-10-CM

## 2010-05-03 DIAGNOSIS — M1990 Unspecified osteoarthritis, unspecified site: Secondary | ICD-10-CM | POA: Diagnosis present

## 2010-05-03 DIAGNOSIS — M545 Low back pain, unspecified: Secondary | ICD-10-CM | POA: Diagnosis present

## 2010-05-03 DIAGNOSIS — Z86718 Personal history of other venous thrombosis and embolism: Secondary | ICD-10-CM

## 2010-05-03 DIAGNOSIS — Z8719 Personal history of other diseases of the digestive system: Secondary | ICD-10-CM

## 2010-05-03 DIAGNOSIS — Z96659 Presence of unspecified artificial knee joint: Secondary | ICD-10-CM

## 2010-05-03 DIAGNOSIS — K589 Irritable bowel syndrome without diarrhea: Secondary | ICD-10-CM | POA: Insufficient documentation

## 2010-05-03 DIAGNOSIS — M899 Disorder of bone, unspecified: Secondary | ICD-10-CM | POA: Diagnosis present

## 2010-05-03 DIAGNOSIS — R109 Unspecified abdominal pain: Secondary | ICD-10-CM

## 2010-05-03 DIAGNOSIS — K219 Gastro-esophageal reflux disease without esophagitis: Secondary | ICD-10-CM | POA: Diagnosis present

## 2010-05-03 DIAGNOSIS — Z86711 Personal history of pulmonary embolism: Secondary | ICD-10-CM

## 2010-05-03 DIAGNOSIS — R32 Unspecified urinary incontinence: Secondary | ICD-10-CM | POA: Diagnosis present

## 2010-05-03 DIAGNOSIS — Z7982 Long term (current) use of aspirin: Secondary | ICD-10-CM

## 2010-05-03 DIAGNOSIS — E785 Hyperlipidemia, unspecified: Secondary | ICD-10-CM | POA: Diagnosis present

## 2010-05-03 DIAGNOSIS — Z79899 Other long term (current) drug therapy: Secondary | ICD-10-CM

## 2010-05-03 LAB — BASIC METABOLIC PANEL
GFR calc Af Amer: 58 mL/min — ABNORMAL LOW (ref >60)
Sodium: 135 mEq/L (ref 135–145)

## 2010-05-03 LAB — LACTIC ACID, PLASMA: Lactic Acid, Venous: 1.6 mmol/L (ref 0.5–2.2)

## 2010-05-03 MED ORDER — KETOROLAC TROMETHAMINE 30 MG/ML IM SOLN
30.0000 mg | Freq: Once | INTRAMUSCULAR | Status: AC
  Administered 2010-05-03: 30 mg via INTRAMUSCULAR

## 2010-05-03 MED ORDER — SODIUM CHLORIDE 0.9 % IV SOLN: INTRAVENOUS | Status: DC

## 2010-05-03 MED ORDER — IOHEXOL 300 MG/ML  SOLN
100.0000 mL | Freq: Once | INTRAMUSCULAR | Status: AC | PRN
  Administered 2010-05-03: 100 mL via INTRAVENOUS

## 2010-05-03 NOTE — Assessment & Plan Note (Signed)
Not on long term anticoagulation. No signs of recurrent PE / DVT at this time.

## 2010-05-03 NOTE — Progress Notes (Signed)
  Subjective:    Patient ID: Jennifer Owen, female    DOB: [birth year], 77 y.o.   MRN: [id number]  HPI 77 years old female with past medical history of Past Medical History  Diagnosis Date  . GERD (gastroesophageal reflux disease)     Papulous gastropathy EGD Oct. 2007  . Hypertension   . Low back pain     intermittent radiculopathy  . Osteoarthritis   . Pulmonary embolism     2/2 knee replacement  . Osteopenia     T score-1.1 R Fremur, - 0.4 L spine  . Hyperlipemia   . Polio 1942  . Urinary incontinence    Presents with continued severe abdominal pain. Pain is located in the RLQ, does not radiate, constant with acute exacebrations, not related to urination. The symptoms are persistent to worsening over last few 5 days. She was on cephalexin for five days after being seen in ED. She had a CT abd/pelvis with contrast that was notable for 3 cm area of distal colon mass/persistalsis/abscess. She has not been able to take much orally. She lives by herself and does not feel safe to go home.  She says that the diarrhea has stopped. She has not noticed any blood in the stool. She denies fevers but endorses nausea. She has not slept well for five days. She has taken vicodin without much benefit. She does not like to take morphine that was given to her in February as she does not want to get hooked to those medicine.   Review of Systems  All other systems reviewed and are negative.       Objective:   Physical Exam  Constitutional: She is oriented to person, place, and time. She appears well-developed and well-nourished. She appears distressed.  HENT:  Head: Normocephalic and atraumatic.  Right Ear: External ear normal.  Left Ear: External ear normal.  Eyes: Conjunctivae and EOM are normal. Pupils are equal, round, and reactive to light. Right eye exhibits no discharge. Left eye exhibits no discharge.  Neck: Normal range of motion. Neck supple. No thyromegaly present.    Cardiovascular: Normal rate, regular rhythm and normal heart sounds.   Pulmonary/Chest: Effort normal and breath sounds normal.  Abdominal: Soft. She exhibits no distension and no ascites. There is no hepatosplenomegaly. There is tenderness in the right lower quadrant. There is guarding. There is no rigidity, no rebound, no CVA tenderness, no tenderness at McBurney's point and negative Murphy's sign. No hernia.    Neurological: She is alert and oriented to person, place, and time. She has normal reflexes.  Skin: She is not diaphoretic.  Psychiatric: She has a normal mood and affect. Her behavior is normal. Judgment normal.          Assessment & Plan:

## 2010-05-03 NOTE — Assessment & Plan Note (Signed)
Continue Zocor. LDL and HDL at target. FLP may be tested in the hospital as last test was one year ago.

## 2010-05-03 NOTE — Telephone Encounter (Signed)
Pt seen in ED on Thursday for diarrhea and pain in abdominal area and vaginal area. Now pain in umbilical area and vaginal area.  Pt has appointment for 1330 today in clinic.

## 2010-05-03 NOTE — Progress Notes (Signed)
IV started with # 22 angio cath in left hand NS started on pump at 75cc/hr.

## 2010-05-03 NOTE — Progress Notes (Signed)
Addended by: Alric Quan on: 05/03/2010 04:10 PM   Modules accepted: Orders

## 2010-05-03 NOTE — Assessment & Plan Note (Signed)
Pt has history of diverticulosis. Her abdominal pain, intensity, failure of antibiotic outpatient management, inability to keep things orally, not feeling safe at home indicates she indeed needs to be hospitalized. Plan  Repeat Abd/pelvis CT scan with contrast NPO IV fluids Pain control Antibiotics to cover gram negatives with anaerobes preferably IV CBC/CMET UA/Urine micro  Expand workup based upon CT scan results.

## 2010-05-03 NOTE — H&P (Signed)
Hospital Admission Note Date: 05/03/2010  Patient name: Jennifer Owen Medical record number: [id number] Date of birth: 06-Apr-1931 Age: 77 y.o. Gender: female PCP: Carrolyn Meiers, MD  Medical Service:  Attending physician:  Lina Sayre    Pager: Resident (R2/R3): Bethel Born     [phone] Resident (R1): Lyn Hollingshead                 Pager:[phone]  Chief Complaint:   History of Present Illness: Jennifer. Owen is a 77 yo woman who presents to [facility] with continued severe abdominal pain. Pain is located in the RLQ, sharp, 10/10, does not radiate, constant, not related to urination. The symptoms are persistent to worsening over last few 5 days. She also has some mild suprapubic pain which was present at the time of ED visit, which is better and burning micturition is also resolved. She was on cephalexin for five days after being seen in ED on 04/29/10. She had a CT abd/pelvis with contrast that was notable for 3 cm area of distal colon mass/persistalsis/abscess. She has not been able to take much orally, but she denies any vomiting episodes, as such she is able to keep things down. She lives by herself and does not feel safe to go home.  She says that the diarrhea, which she had when she was in ED last time, has stopped. She has not noticed any blood in the stool. She denies fevers. She has not slept well for five days. She has taken vicodin without much benefit. She does not like to take morphine that was given to her in February as she does not want to get hooked to those medicine. Denies any CP, SOB, headache, vision changes, cough with sputum production, weight loss, night sweats or decreased appetite.   Current Outpatient Prescriptions  Medication Sig Dispense Refill  . aspirin 81 MG chewable tablet Chew 81 mg by mouth daily.        Marland Kitchen esomeprazole (NEXIUM) 40 MG capsule Take 40 mg by mouth daily.        . Glucosamine-Chondroit-Vit C-Mn (GLUCOSAMINE CHONDR 1500 COMPLX PO) Take 1 tablet by  mouth 2 (two) times daily.        . hydrochlorothiazide 25 MG tablet Take 25 mg by mouth daily.        Marland Kitchen HYDROcodone-acetaminophen (VICODIN) 5-500 MG per tablet Take 1 tablet by mouth every 4 (four) hours as needed. For pain in foot        . hyoscyamine (LEVSIN SL) 0.125 MG SL tablet Place 0.125 mg under the tongue every 4 (four) hours as needed. May use 1-2 tablets as needed for pain         . lisinopril (PRINIVIL,ZESTRIL) 40 MG tablet Take 40 mg by mouth daily.        . Multiple Vitamins-Minerals (CENTRUM SILVER PO) Take 1 tablet by mouth daily.        . silver sulfADIAZINE (SILVADENE) 1 % cream Apply topically 2 (two) times daily. To affected area       . simvastatin (ZOCOR) 20 MG tablet Take 20 mg by mouth daily.          Allergies: Tramadol hcl  Past Medical History  Diagnosis Date  . GERD (gastroesophageal reflux disease)     Papulous gastropathy EGD Oct. 2007  . Hypertension   . Low back pain     intermittent radiculopathy  . Osteoarthritis   . Pulmonary embolism     2/2 knee replacement  . Osteopenia  T score-1.1 R Fremur, - 0.4 L spine  . Hyperlipemia   . Polio 1942  . Urinary incontinence     Past Surgical History  Procedure Date  . Joint replacement     total knee - left 1995, right 2004  . Abdominal hysterectomy 1985  . Oophorectomy 1985    Family History  Problem Relation Age of Onset  . Cancer Daughter     colon caner, <60    History   Social History  . Marital Status: Legally Separated    Spouse Name: N/A    Number of Children: 7  . Years of Education: N/A   Occupational History  . 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 [employer]    Social History Main Topics  . Smoking status: Former Games developer  . Smokeless tobacco: Not on file  . Alcohol Use: No  . Drug Use: No  . Sexually Active: Not on file   Other Topics Concern  . Not on file   Social History Narrative   [facility]    Review of Systems: As per HPI... All  other systems reviewed and negative.  Physical Exam:  T- 98 , P- 52 , R- 16, BP- 141/82, O2 sats- 98% on RA  Constitutional: Vital signs reviewed.  Patient is a well-developed and well-nourished  in no acute distress and cooperative with exam. Alert and oriented x3.  Head: Normocephalic and atraumatic Ear: TM normal bilaterally Mouth: no erythema or exudates, MMM Eyes: PERRL, EOMI, conjunctivae normal, No scleral icterus.  Neck: Supple, Trachea midline normal ROM, No JVD, mass, thyromegaly, or carotid bruit present.  Cardiovascular: RRR, S1 normal, S2 normal, no MRG, pulses symmetric and intact bilaterally Pulmonary/Chest: CTAB, no wheezes, rales, or rhonchi Abdominal: Soft. Mildly Tender to palpation on RLQ and suprapubic area, non-distended, NR, NG.  bowel sounds are normal. GU: no CVA tenderness Musculoskeletal: No joint deformities, erythema, or stiffness, ROM full and no nontender Hematology: no cervical, inginal, or axillary adenopathy.  Neurological: A&O x3, Strenght is normal and symmetric bilaterally, cranial nerve II-XII are grossly intact, no focal motor deficit, sensory intact to light touch bilaterally.  Skin: Warm, dry and intact. No rash, cyanosis, or clubbing.  Psychiatric: Normal mood and affect. speech and behavior is normal. Judgment and thought content normal. Cognition and memory are normal.     Lab results:  Imaging results: CT ABDOMEN AND PELVIS WITH CONTRAST- 04/29/10 IMPRESSION:   Small amount of free fluid within the pelvis.  Source   indeterminate.    Circumferential narrowing ( spanning over 3 cm) of the distal   transverse colon. Question mass versus result of peristalsis.    Diverticulosis of left colon and sigmoid colon.    Scoliosis and prominent degenerative changes.    Assessment & Plan by Problem: 1) Abdominal pain:  Jennifer Owen has worsening RLQ abdominal pain for about 5 days now. She had a CT abd/pelvis showing no signs of diverticulitis on  04/29/10, but did show circumferential narrowing of her transverse colon. Considering her PH of Diverticulitis- its definitely high on list- even though pain is not LLQ. The infection might have been complicated with an abscess ( although she was on cephalexin for 5 days, which will not cover anaerobes satisfactorily). Also she might have appendicitis as its RLQ pain, but the Hx is not typical. Also she might just have unresolved UTI. - Will admit to regular bed. - Will repeat CT abd/pelvis w/ contrast to r/o diverticulitis/abscess. -  Will repeat U/A, CBC, BMET. - Will keep her NPO for now- except meds. - Will start her empirically for diverticulitis Rx( uncomplicated vs. Complicated) - with Cipro and flagyl IV- will alter as per CT result, if needed. - Pain control with IM Toradol( w/ ? Of inflammation) and tylenol;  2) Hypertension:  BP well controlled on current regimen. - Will hold on Lisinopril for now and continue all other meds.  3) HLD:  Continue Zocor.  4) GERD:  Start on Protonix 40 po daily.  5) Osteoarthritis and Osteopenia:  Continue on home meds.  6) VTE Prophy: Lovenox

## 2010-05-03 NOTE — Assessment & Plan Note (Signed)
She also reports epigastric pain, which could be related to H Pylori reinfection. Check antibody in blood and decide on retreatment. Protonix while in the hospital.

## 2010-05-03 NOTE — Progress Notes (Signed)
Addended byAllene Dillon on: 05/03/2010 03:54 PM   Modules accepted: Orders

## 2010-05-03 NOTE — Progress Notes (Signed)
Addended byAllene Dillon on: 05/03/2010 02:44 PM   Modules accepted: Orders

## 2010-05-03 NOTE — Assessment & Plan Note (Signed)
Continue current medications. May need to convert them to IV meds if kept NPO.

## 2010-05-04 DIAGNOSIS — R109 Unspecified abdominal pain: Secondary | ICD-10-CM

## 2010-05-04 LAB — URINALYSIS, ROUTINE W REFLEX MICROSCOPIC
Bilirubin Urine: NEGATIVE
Glucose, UA: NEGATIVE mg/dL
Nitrite: NEGATIVE
Specific Gravity, Urine: 1.005 (ref 1.005–1.030)
pH: 5.5 (ref 5.0–8.0)

## 2010-05-04 LAB — GLUCOSE, CAPILLARY: Glucose-Capillary: 124 mg/dL — ABNORMAL HIGH (ref 70–99)

## 2010-05-04 NOTE — Progress Notes (Signed)
Pt taken to Room 3703 via w/c after report was given. Stanton Kidney Dariel Pellecchia RN 05/03/10 5PM

## 2010-05-05 LAB — CBC
MCV: 91.9 fL (ref 78.0–100.0)
Platelets: 230 10*3/uL (ref 150–400)
RBC: 3.6 MIL/uL — ABNORMAL LOW (ref 3.87–5.11)
WBC: 3.8 10*3/uL — ABNORMAL LOW (ref 4.0–10.5)

## 2010-05-05 LAB — URINE CULTURE
Colony Count: NO GROWTH
Culture  Setup Time: 201204240840
Special Requests: NEGATIVE

## 2010-05-05 LAB — BASIC METABOLIC PANEL
Chloride: 107 mEq/L (ref 96–112)
GFR calc Af Amer: 56 mL/min — ABNORMAL LOW (ref >60)
Potassium: 3.5 mEq/L (ref 3.5–5.1)

## 2010-05-05 LAB — CLOSTRIDIUM DIFFICILE BY PCR: Toxigenic C. Difficile by PCR: NEGATIVE

## 2010-05-06 DIAGNOSIS — R109 Unspecified abdominal pain: Secondary | ICD-10-CM

## 2010-05-06 LAB — CBC
MCH: 32.2 pg (ref 26.0–34.0)
MCV: 93 fL (ref 78.0–100.0)
Platelets: 226 10*3/uL (ref 150–400)
RDW: 13.3 % (ref 11.5–15.5)

## 2010-05-06 LAB — BASIC METABOLIC PANEL
BUN: 10 mg/dL (ref 6–23)
Chloride: 107 mEq/L (ref 96–112)
Creatinine, Ser: 1.04 mg/dL (ref 0.4–1.2)
GFR calc non Af Amer: 51 mL/min — ABNORMAL LOW (ref >60)

## 2010-05-07 ENCOUNTER — Other Ambulatory Visit: Payer: Self-pay | Admitting: Internal Medicine

## 2010-05-07 LAB — CBC
MCHC: 34 g/dL (ref 30.0–36.0)
MCV: 92.4 fL (ref 78.0–100.0)
Platelets: 236 10*3/uL (ref 150–400)
RDW: 13.5 % (ref 11.5–15.5)
WBC: 5.3 10*3/uL (ref 4.0–10.5)

## 2010-05-07 LAB — BASIC METABOLIC PANEL
BUN: 8 mg/dL (ref 6–23)
Calcium: 9.7 mg/dL (ref 8.4–10.5)
GFR calc non Af Amer: 55 mL/min — ABNORMAL LOW (ref >60)
Potassium: 4 mEq/L (ref 3.5–5.1)

## 2010-05-07 LAB — GRAM STAIN

## 2010-05-07 LAB — MAGNESIUM: Magnesium: 1.7 mg/dL (ref 1.5–2.5)

## 2010-05-07 MED ORDER — DICYCLOMINE HCL 20 MG PO TABS: 20.0000 mg | ORAL_TABLET | Freq: Four times a day (QID) | ORAL | Status: DC

## 2010-05-07 MED ORDER — LOPERAMIDE HCL 2 MG PO TABS: 2.0000 mg | ORAL_TABLET | Freq: Four times a day (QID) | ORAL | Status: DC | PRN

## 2010-05-10 LAB — STOOL CULTURE

## 2010-05-10 LAB — GIARDIA/CRYPTOSPORIDIUM SCREEN(EIA)
Cryptosporidium Screen (EIA): NEGATIVE
Giardia Screen - EIA: NEGATIVE

## 2010-05-14 ENCOUNTER — Encounter: Payer: Self-pay | Admitting: Internal Medicine

## 2010-05-14 ENCOUNTER — Ambulatory Visit (INDEPENDENT_AMBULATORY_CARE_PROVIDER_SITE_OTHER): Payer: Medicare Other | Admitting: Internal Medicine

## 2010-05-14 VITALS — BP 110/59 | HR 52 | Temp 97.7°F | Wt 192.8 lb

## 2010-05-14 DIAGNOSIS — K589 Irritable bowel syndrome without diarrhea: Secondary | ICD-10-CM

## 2010-05-14 DIAGNOSIS — I10 Essential (primary) hypertension: Secondary | ICD-10-CM

## 2010-05-14 MED ORDER — ESOMEPRAZOLE MAGNESIUM 40 MG PO CPDR: 40.0000 mg | DELAYED_RELEASE_CAPSULE | Freq: Every day | ORAL | Status: DC

## 2010-05-14 MED ORDER — LOPERAMIDE HCL 2 MG PO TABS: 2.0000 mg | ORAL_TABLET | Freq: Four times a day (QID) | ORAL | Status: DC | PRN

## 2010-05-14 MED ORDER — DICYCLOMINE HCL 20 MG PO TABS: 20.0000 mg | ORAL_TABLET | Freq: Four times a day (QID) | ORAL | Status: DC | PRN

## 2010-05-14 NOTE — Progress Notes (Signed)
  Subjective:    Patient ID: Jennifer Owen, female    DOB: 02-Nov-1931, 77 y.o.   MRN: [id number]  HPI Jennifer Owen is a pleasant 18 year lady who comes accompanied by her daughter to the clinic for a follow up visit after recent hospital admission for abdominal pain and diarrhea. Her abdominal pain is  resolved and she has no episodes of diarrhea since last Wednesday. She feels great and looks much better.  She has irritable bowel syndrome and her diarrhea and pain was likely due to that and was prescribed loperamide and dicyclomine she got better on that. Denies any fever, chills, cough ,chest pain, headache, diarrhea, abdominal pain, urinary abnormalities.    Review of Systems    as per history of present illness Objective:   Physical Exam    Constitutional: Vital signs reviewed.  Patient is a well-developed and well-nourished in no acute distress and cooperative with exam. Alert and oriented x3.  Head: Normocephalic and atraumatic Ear: TM normal bilaterally Mouth: no erythema or exudates, MMM Eyes: PERRL, EOMI, conjunctivae normal, No scleral icterus.  Neck: Supple, Trachea midline normal ROM, No JVD, mass, thyromegaly, or carotid bruit present.  Cardiovascular: RRR, S1 normal, S2 normal, no MRG, pulses symmetric and intact bilaterally Pulmonary/Chest: CTAB, no wheezes, rales, or rhonchi Abdominal: Soft. Non-tender, non-distended, bowel sounds are normal, no masses, organomegaly, or guarding present.  GU: no CVA tenderness Musculoskeletal: No joint deformities, erythema Neurological: A&O x3, Strenght is normal and symmetric bilaterally. Skin: Warm, dry and intact. No rash, cyanosis, or clubbing.  Psychiatric: Normal mood and affect. speech and behavior is normal. Judgment and thought content normal. Cognition and memory are normal.       Assessment & Plan:

## 2010-05-14 NOTE — Assessment & Plan Note (Signed)
She was admitted to hospital secondary to severe abdominal pain without any fever or abnormal vitals. CT scan of abdomen showed no signs of diverticulitis or any other serious problems. She was diagnosed with irritable bowel syndrome due to her pattern of constipation followed by intermittent diarrhea and crampy abdominal pain with history of depression, family history of colon cancer in a daughter and worrying about that. Explained to her in detail during hospital course about her having a normal colonoscopy in 2011 and now CT scan twice in March 2012, baseline is likelihood of her developing colon cancer. Will give her prescription for loperamide and dicyclomine for when necessary use she got better on that and she wanted that.

## 2010-05-14 NOTE — Assessment & Plan Note (Signed)
Blood pressure well controlled on current medication. Will not change anything during this visit.

## 2010-05-14 NOTE — Patient Instructions (Signed)
Please make a followup appointment in 3-4 months. Please take medications as instructed. Continue to take Tylenol for pain as needed. Also start using dicyclomine for belly pain or cramps as needed from now onwards. We will stop Famotidine today and just continue Nexium.

## 2010-05-25 NOTE — Consult Note (Signed)
NAMECHINARA, [name]:  [id number]   MEDICAL RECORD NO.:  [id number]           PATIENT TYPE:   LOCATION:                                 FACILITY:   PHYSICIAN:  James L. Randa Evens, M.D. DATE OF BIRTH:  05-28-1931   DATE OF CONSULTATION:  11/13/2006  DATE OF DISCHARGE:                                 CONSULTATION   We were asked to see [name] today in consultation for abdominal  pain and anemia by Dr. Margarito Liner.   HISTORY OF PRESENT ILLNESS:  This is a 77 year old female who reports:  1. Falling on October 30th.  She went to [facility] Emergency Room      and began to receive 800 mg of Motrin for her back pain.  2. She reports that on October 31st, she ate a bologna sandwich and an      apple and subsequently had 6 episodes of vomiting, during which she      saw no blood.  3. She states that after she came to the emergency room and was      admitted, then on Saturday, November 1st, she started having      diarrhea and reports 6 episodes of watery diarrhea.  Now she says      her bowel movements are more like baby food.  She has no history of      similar symptoms. her bowels are normally constipated.  She      requires Dulcolax to have a bowel movement.  She has no recent      history of antibiotic use.  She reports a good appetite.  She      reports purposely losing 91 pounds over the last year.  She denies      any emesis or diarrhea in the last 24 hours, but is both positive      for nausea.   PAST MEDICAL HISTORY:  She has gastritis on upper endoscopy in 2007 by  Dr. Dorena Cookey.  Her last colonoscopy was in February 2004.  She was  found to have diverticulosis and internal hemorrhoids.  The rest of her  medical history is significant for hypertension osteoarthritis.  She is  status post right knee replacement.  She has a history of a DVT  following her right knee replacement.  History of hysterectomy.  Chronic  lower back pain.  Hyperlipidemia.   GERD.  Obesity.  She had polio and  1942.   She has no known drug allergies.   CURRENT MEDICATIONS:  Nexium which she takes daily, Skelaxin, morphine,  Zocor, lisinopril, HCTZ, Detrol, 81 mg of aspirin, and recent addition  of 800 mg of Motrin.   FAMILY HISTORY:  Significant for her daughter with colon cancer who  passed away at age 64 and her mother who passed with esophageal cancer.   Current labs show a hemoglobin of 10.7, that is down from 14 on  admission 4 days ago.  Hematocrit 31.4, white count 5.1, platelets  240,000.  Her Chem-7 is within normal limits.  She is C diff negative  x2.  She is guaiac negative x2.  She did have 1 guaiac-positive stool on  Saturday when she was having severe diarrhea.  Her amylase and lipase  are normal.  Her LFTs are normal.  CT of her abdomen and pelvis done on  Saturday shows a mid small bowel loop wall thickening, as well as  perienteric inflammatory changes.  These are consistent with  gastroenteritis.  She has no small bowel obstruction.   ASSESSMENT:  Dr. Carman Ching has seen and examined the patient,  collected a history, and  reviewed her chart.  He states this is a 20-  year-old female with multiple medical problems.  Her recent history is  consistent with gastroenteritis, although the drop in hemoglobin is  questionable.  For now, consider discontinuation of antibiotics, as she  has been afebrile and had a normal white count.  She has no signs of  diverticulitis on CT scan.  Likely, her nausea will improve.  Therefore,  Flagyl is discontinued.  Add Flora-Q probiotic.  The patient does need a  colonoscopy probably as an outpatient when her acute illness has  resolved.  Unfortunately, this patient was dismissed by [facility] physicians  in early 2008, so she will remain unassigned going forward.  We will  follow her during this hospitalization with you.   Thanks very much this consultation.      Stephani Police, PA     ______________________________  Llana Aliment Randa Evens, M.D.    MLY/MEDQ  D:  11/13/2006  T:  11/14/2006  Job:  [id number]   cc:   Fayrene Fearing L. Malon Kindle., M.D.  Ileana Roup, M.D.

## 2010-05-25 NOTE — Discharge Summary (Signed)
Jennifer Owen, Jennifer [name]:  [id number]   MEDICAL RECORD NO.:  [id number]          PATIENT TYPE:  INP   LOCATION:  5507                         FACILITY:  [facility]   PHYSICIAN:  Rufina Falco, M.D.     DATE OF BIRTH:  [birth year]   DATE OF ADMISSION:  11/11/2006  DATE OF DISCHARGE:  11/17/2006                               DISCHARGE SUMMARY   DISCHARGE DIAGNOSES:  1. Abdominal pain, nausea and vomiting, diarrhea.  2. Gastroesophageal reflux disease.  3. Hyperlipidemia.  4. Hypertension.  5. Hypokalemia.  6. Past history of deep venous thrombosis and pulmonary embolism.   DISCHARGE MEDICATIONS WITH ACCURATE DOSES:  1. Protonix 40 mg p.o. daily.  2. Zocor 20 mg p.o. daily.  3. Detrol 4 mg p.o. daily.  4. Dicyclomine 10 mg p.o. p.r.n. abdominal pain up to four times a      day.  5. Phenergan 12.5 mg p.o. q.6h. p.r.n. nausea.  6. Maalox 50 mL up to four times a day.   DISPOSITION AND FOLLOW-UP:  The patient is to follow-up with Dr. Clent Ridges  in the outpatient clinic on November 27, 2006, at 10:45 a.m. in the  morning.  At this time the patient should have a CBC done as well as  follow-up on her nausea, vomiting and abdominal pain.  The patient saw  Dr. Randa Evens with GI during the hospital stay.  If the patient continues  to have abdominal pain, she may benefit from a capsule endoscopy  secondary to CT findings showing edema of the small bowel walls.  She  will need to be referred back to Dr. Randa Evens if this has not resolved.   PROCEDURES PERFORMED:  1. The patient had an EGD done on November 15, 2006, which showed      a.     Erosive esophagitis.      b.     Epigastric pain.      c.     Positive stool.  2. The patient had a colonoscopy with biopsy on November 15, 2006,      which showed  diverticulosis and internal hemorrhoids.  Biopsies at      that time showed melanosis coli  No active inflammation,      microscopic colitis or granulomas.  3. The patient had a  CT of the abdomen and pelvis done on November 11, 2006.  CT of the abdomen showed      a.     Abnormal thickening.  Findings are likely due to an       enteritis.  Most commonly, this is secondary to inflammatory       infectious etiologies.  Ischemic enteritis is considered less       favored.      b.     Increased caliber of small bowel loops without evidence for       high-grade obstruction.      c.     Ascites.  This was likely secondary to inflammatory process       within the small-bowel.  4. CT of the pelvis.  Impression:  Moderate ascites within the pelvis      likely secondary to inflammatory process in the upper abdomen.  5. Chest x-ray November 11, 2006, right PICC line tip mid SVC.  6. The patient had an acute abdominal series on November 10, 2006,      which showed no active cardiopulmonary disease.  Mild small bowel      dilatation.  The air fluid levels are somewhat worrisome.  Early      small bowel obstruction cannot be excluded.   CONSULTATION:  James L. Randa Evens, M.D., with gastroenterology.   CHIEF COMPLAINT:  Nausea, vomiting, abdominal pain and diarrhea primary.   PRIMARY CARE PHYSICIAN:  Carlus Pavlov, M.D.   HISTORY OF PRESENT ILLNESS:  The patient is a 77 year old African  American lady with past medical history of GERD, hypertension,  hyperlipidemia who presented to [facility] ED with one-day  history of nausea, vomiting, abdominal pain and diarrhea.  Her problems  started after she ate an apple bologna sandwich on the afternoon of  admission.  She vomited six to seven times, mostly food particles  without any blood.  Her pain in the abdomen was described as diffuse,  constant, 10/10, dull aching, nonradiating.  She reported about six  episodes of diarrhea, mostly watery, no blood, no mucus.  She denies any  fevers, weakness, headache, sore throat, cough, shortness of breath,  chest pain, leg or calf swelling, rashes, dysuria.  She denies  any  recent travel history.   ALLERGIES:  NO KNOWN DRUG ALLERGIES.   PAST MEDICAL HISTORY:  1. GERD.  2. Positive for H. pylori.  3. Hyperlipidemia.  4. Osteoarthritis of right knee next hypertension  5. DVT and PE.  6. Hysterectomy.  7. Chronic low back pain.  8. Osteopenia.  9. The patient had colonoscopy in February 2004, which showed right      and left diverticulosis and internal hemorrhoids.  10.She had EGD done in 2007, which showed __________ gastropathy,      positive esophagitis.   HOME MEDICATIONS:  Include aspirin, Detrol, hydrochlorothiazide,  lisinopril, Nexium, Zocor, vitamin D, multivitamin. MS Contin, morphine,  Skelaxin.   SUBSTANCE HISTORY:  She is a former smoker.  She quit 36 years ago.  She  denies alcohol, cocaine, IV drugs.  She is divorced.  She has Medicare  for insurance.  She lives at a senior citizens apartment.   FAMILY HISTORY:  Mother passed away at 3 with cancer of the esophagus.  Father passed away at 61 of old age.  She has a daughter who passed away  with colon cancer at the age of 36.   REVIEW OF SYSTEMS:  Per HPI in addition to a 9 pound weight loss over  the last nine months.   PHYSICAL EXAMINATION:  VITAL SIGNS:  Temperature 98.7, blood pressure  138/97, pulse 62, respiratory 21, O2 sat 100% on room air.  GENERAL:  No acute distress.  HEENT:  Eyes anicteric, no pallor, extraocular muscles intact, PERRLA.  ENT: Moist oral mucosa.  NECK:  Supple.  No lymphadenopathy, no thyromegaly, no carotid bruits.  RESPIRATORY:  CTA bilaterally.  CARDIOVASCULAR: Regular rate and rhythm.  Positive S1 and S2, no  murmurs, rubs or gallops.  GI:  Soft, but diffusely tender, no rebound tenderness, positive bowel  sounds, no organomegaly.  RECTAL:  Vault is empty.  EXTREMITIES:  No pedal edema, no calf swelling, no cyanosis.  GU:  No CVA tenderness.  SKIN:  No rash.  Normal turgor.  LYMPHS:  No lymphadenopathy.  MUSCULOSKELETAL:  No joint  swelling, no tenderness.  NEUROLOGIC:  Alert and oriented x3, nonfocal.  Psychiatric is  appropriate.   ADMISSION LABS:  Sodium 145, potassium 3.3, chloride 110, bicarb 27,  anion gap of 8, BUN 8, creatinine 0.8, glucose 117.  WBC 8.8, hemoglobin  14, platelets 294, ANC 7, MCV 93.6.   Bilirubin 0.6, alk phos 75, AST 20, ALT 17, protein 6.1, albumin 3.3,  calcium 10.2, lipase 37, amylase 72.  UA was yellow, cloudy, trace  ketones, urobilinogen 0.2, nitrites negative, leukocytes negative.   The patient had a CT of the abdomen and pelvis as mentioned above as  well as acute abdominal series, which is mentioned above.   HOSPITAL COURSE BY PROBLEM:  PROBLEM #1 -  ABDOMINAL PAIN, NAUSEA,  VOMITING AND DIARRHEA:  The patient was admitted to teaching service.  Acute abdominal series was ordered with the above-mentioned results.  She was checked her C. difficile colitis which was subsequently  negative.  A CT of the abdomen and pelvis __________ a possible  inflammatory versus infectious aspect to her small bowel.  GI was  consulted which did an EGD which showed erosive esophagitis and a  colonoscopy which showed diverticulosis and internal hemorrhoids.  Biopsies were also taken which were significant for melanosis coli.  The  patient's hemoglobin stabilized.  She was also put on Flagyl as well as  Cipro initially when she arrived in the hospital secondary for possible  infectious etiology.  The patient's Flagyl was discontinued on November 13, 2006.  The patient was also put on a proton pump inhibitor.  The  patient gradually improved over the next few days and at the time of  discharge her pain was minimal at best.  Her diarrhea was also resolving  and her nausea had also resolved.  The patient will follow-up in the  outpatient clinic with Dr. Clent Ridges.  If the patient continues to have  abdominal pain, nausea and vomiting,  she will need to be referred back  to Dr. Randa Evens for possible  capsule endoscopy, plus or minus MRA of the  abdominal vessels, to help rule out ischemic cause of abdominal pain.  Of note, the patient had a lactic acid level during hospital stay which  was subsequently negative.  She also had a fecal lactoferrin which was  normal.  Initially the patient was FOBT positive, but subsequent tests  were negative.  The patient also had stool cultures which were negative  for ova and parasites as well as enteric pathogens.  The patient's  hemoglobin on the day of discharge was stable at 10.1.   PROBLEM #2 -  GASTROESOPHAGEAL REFLUX DISEASE:  The patient was  continued on her Protonix throughout hospital stay.   PROBLEM #3 -  HYPERLIPIDEMIA:  The patient was put on Zocor.   PROBLEM #4 -  HYPERTENSION:  The patient's medications were initially  held secondary to fear of shock.  Will restart after the diarrhea  subsides.  This will need to be followed up in the outpatient clinic.   PROBLEM #5 -  HYPOKALEMIA:  The patient was supplemented and her  potassium was 3.6 at time of discharge.  Of note, the patient received  potassium prior to her leaving the hospital.  The patient had a  magnesium checked which was 1.3 which was repleted with magnesium  sulfate IV.  The patient's magnesium should also be checked along with a  potassium  in the outpatient setting.   PROBLEM #6 -  HISTORY OF DEEP VENOUS THROMBOSIS/PULMONARY EMBOLISM:  The  patient was put on prophylactic Lovenox with careful watch of her  hemoglobin secondary to the initial positive FOBT.   DISCHARGE LABS:  Sodium 144, potassium 3.6, chloride 116, bicarb 22,  glucose 92, BUN 3, creatinine 0.88, calcium 8.4. WBC 5.1, hemoglobin  10.1, hematocrit 30.1, MCV 95.3, RDW 13.7, platelets 218.   DISCHARGE VITALS:  Temperature 98, pulse 66, blood pressure 138/61,  respiratory rate 18, O2 sats 97% on room air.  For further details,  please see hospital chart.      Rufina Falco, M.D.  Electronically  Signed     JY/MEDQ  D:  11/23/2006  T:  11/24/2006  Job:  [id number]   cc:   Clent Ridges, M.D.

## 2010-05-25 NOTE — Op Note (Signed)
Jennifer Owen             ACCOUNT NO.:  [id number]   MEDICAL RECORD NO.:  [id number]          PATIENT TYPE:  INP   LOCATION:  5507                         FACILITY:  [facility]   PHYSICIAN:  James L. Malon Kindle., M.D.DATE OF BIRTH:  [birth year]   DATE OF PROCEDURE:  11/15/2006  DATE OF DISCHARGE:                               OPERATIVE REPORT   PROCEDURE:  Esophagogastroduodenoscopy.   MEDICATIONS:  1. Cetacaine spray.  2. Fentanyl 75 mcg.  3. Versed 6 mg IV.   INDICATIONS:  Vague abdominal pain, heme-positive stool, and diarrhea.   DESCRIPTION OF PROCEDURE:  Procedure explained to the patient, and  consent obtained.  Left lateral decubitus position. The Pentax upper  scope was inserted and advanced.  We advanced down into the stomach.  The pyloric channel was seen and passed.  The duodenum including the  bulb and second portion was seen well and was normal.  No ulceration or  inflammation.  The pyloric channel, antrum, and body were normal.  Fundus and cardia seen well on retroflex view and were normal.  There  were several small erosions in the distal esophagus.  No active bleeding  or ulceration.  The proximal esophagus was normal.  The scope was  withdrawn.  The patient tolerated the procedure well.   ASSESSMENT:  1. Erosive esophagitis.  2. Epigastric pain.  3. Positive stool.   PLAN:  Will treat the patient with omeprazole and proceed with  colonoscopy at this time.           ______________________________  Llana Aliment Malon Kindle., M.D.     Waldron Session  D:  11/15/2006  T:  11/16/2006  Job:  [id number]   cc:   Ileana Roup, M.D.

## 2010-05-25 NOTE — Op Note (Signed)
NAMEREYANNA, Jennifer Owen             ACCOUNT NO.:  [id number]   MEDICAL RECORD NO.:  [id number]          PATIENT TYPE:  INP   LOCATION:  5507                         FACILITY:  [facility]   PHYSICIAN:  James L. Malon Kindle., M.D.DATE OF BIRTH:  10/28/1931   DATE OF PROCEDURE:  11/15/2006  DATE OF DISCHARGE:                               OPERATIVE REPORT   PROCEDURE:  Colonoscopy and biopsy.   MEDICATIONS:  The patient received a total of fentanyl 100 mcg, Versed 7  mg and Phenergan 12.5 mg for both procedures.   INDICATIONS:  Family history of colon cancer, diarrhea and heme-positive  stool.  Upper endoscopy just before this showed some erosive  esophagitis.   DESCRIPTION OF PROCEDURE:  Procedure explained to the patient, consent  obtained.  Following endoscopy, the patient was turned around and the  scope inserted.  We were able to easily reach the cecum.  We had  discussed either a sigmoid or a full colonoscopy with her because we  were not sure how good the prepped would be, which the prep was  excellent and we reached the cecum without difficulty.  Ileocecal valve  and appendiceal orifice seen.  Scope withdrawn.  Cecum, ascending,  transverse, descending and sigmoid colon seen well.  No polyps or other  lesions, diverticulosis in the sigmoid colon.  No polyps or active  bleeding.  There were some internal hemorrhoids in the rectum.  Multiple  random biopsies were obtained.  The scope was withdrawn.  The patient  tolerated the procedure well.   ASSESSMENT:  1. Heme-positive stool in a woman with a strong family history of      colon cancer.  No polyps or other gross lesions.  2. Diarrhea with grossly normal mucosa.  Random biopsies obtained.  3. Diverticulosis and internal hemorrhoids.   PLAN:  Will treat the patient with omeprazole for erosive esophagitis.  Will recommend a 5-year repeat colonoscopy and will check pathology  results.  She will be okay to discharge on a regular  diet and will see  her back as needed.           ______________________________  Llana Aliment Malon Kindle., M.D.     Waldron Session  D:  11/15/2006  T:  11/16/2006  Job:  [id number]   cc:   Ileana Roup, M.D.  James L. Malon Kindle., M.D.

## 2010-05-28 NOTE — Op Note (Signed)
   NAME:  Jennifer Owen, Jennifer Owen                       ACCOUNT NO.:  [id number]   MEDICAL RECORD NO.:  [id number]                   PATIENT TYPE:  AMB   LOCATION:  ENDO                                 [facility]ACILITY:  [facility]   PHYSICIAN:  John C. Madilyn Fireman, M.D.                 DATE OF BIRTH:  1931-08-11   DATE OF PROCEDURE:  02/27/2002  DATE OF DISCHARGE:                                 OPERATIVE REPORT   PROCEDURE:  Colonoscopy.   INDICATION FOR PROCEDURE:  Family history of colon cancer in a first-degree  relative, also heme-positive stools.   DESCRIPTION OF PROCEDURE:  The patient was placed in the left lateral  decubitus position and placed on the pulse monitor with continuous low-flow  oxygen delivered by nasal cannula.  She was sedated with 100 mcg IV fentanyl  and 8 mg IV Versed.  The Olympus video colonoscope was inserted into the  rectum and advanced to the cecum, confirmed by transillumination at  McBurney's point and visualization of the ileocecal valve and appendiceal  orifice.  The prep was excellent.  The cecum appeared normal with no masses,  polyps, diverticula, or other mucosal abnormalities.  Within the ascending,  transverse, descending, and sigmoid colon, there were seen multiple  diverticula and no other abnormalities.  The rectum appeared normal, and  retroflexed view of the anus did reveal some internal hemorrhoids.  The  colonoscope was then withdrawn and the patient returned to the recovery room  in stable condition.  She tolerated the procedure well, and there were no  immediate complications.   IMPRESSION:  1. Right and left-sided diverticulosis.  2. Internal hemorrhoids.   PLAN:  Repeat colonoscopy in five years based on family history.                                               John C. Madilyn Fireman, M.D.    JCH/MEDQ  D:  02/27/2002  T:  02/27/2002  Job:  [id number]

## 2010-05-28 NOTE — Discharge Summary (Signed)
   NAME:  Jennifer Owen, Jennifer Owen                       ACCOUNT NO.:  [id number]   MEDICAL RECORD NO.:  [id number]                   PATIENT TYPE:  INP   LOCATION:  5036                                 FACILITY:  [facility]   PHYSICIAN:  Burnard Bunting, M.D.                 DATE OF BIRTH:  09-22-1931   DATE OF ADMISSION:  05/07/2002  DATE OF DISCHARGE:  05/13/2002                                 DISCHARGE SUMMARY   DISCHARGE DIAGNOSIS:  Right knee arthritis.   SECONDARY DIAGNOSES:  1. History of deep venous thrombosis in the left lower extremity.  2. Hypertension.   OPERATION AND PROCEDURES:  Right total knee arthroplasty on 05/07/02.   HOSPITAL COURSE:  [name] is a 77 year old patient with end-  stage right knee arthritis.  The patient underwent right total knee  arthroplasty 05/07/02.  The patient tolerated the procedure well without  immediate complications.  She was noted to have increased dorsiflexion,  plantar flexion and perfusion of the foot on postoperative day number one.  Hematocrit was 30 at that time.  The patient was started on Coumadin for DVT  prophylaxis as well as Lovenox.  She was started with physical therapy to  mobilize.  She was also started on CPM machine and foot pumps.  The patient  was transfused one unit of packed red blood cells for a hemoglobin o 8.2 on  05/10/02.  The patient otherwise had an unremarkable recovery.  She was  discharged to rehabilitation on 05/13/02; incision was intact at that time.  The patient was ambulating in the hall full weight bearing as tolerated.  Postoperative x-rays showed the prosthesis to be in good position.   DISPOSITION:  She was discharged to rehab with her preadmission medications  as well as Percocet and Coumadin.  She will follow up with me in one week  for staple removal.                                               Burnard Bunting, M.D.    GSD/MEDQ  D:  06/08/2002  T:  06/09/2002  Job:  [id number]

## 2010-05-28 NOTE — Discharge Summary (Signed)
NAME:  Jennifer Owen, Jennifer Owen                       ACCOUNT NO.:  [id number]   MEDICAL RECORD NO.:  [id number]                   PATIENT TYPE:  IPS   LOCATION:  4143                                 FACILITY:  [facility]   PHYSICIAN:  Ellwood Dense, M.D.                DATE OF BIRTH:  Nov 01, 1931   DATE OF ADMISSION:  05/13/2002  DATE OF DISCHARGE:  05/18/2002                                 DISCHARGE SUMMARY   DISCHARGE DIAGNOSES:  1. Right total knee arthroplasty secondary osteoarthritis.  2. History of hypertension.  3. Anemia.  4. Hypokalemia.  5. Urinary tract infection.   HISTORY OF PRESENT ILLNESS:  The patient is a 76 year old black female with  a past medical history of left DVT and hypertension, admitted on May 07, 2002, for right total knee replacement secondary to OA by Dr. Rise Paganini.  The patient was placed on Coumadin for DVT prophylaxis, increase  weightbearing as tolerated, ambulating 100 feet with minimum assist with a  standard walker.   HOSPITAL COURSE:  Hospital course was significant for anemia, status post  transfusion, and left toe pain.  X-rays were performed.  They demonstrated  no significant abnormality.  The patient was checked at [facility]  Department on May 13, 2002.   PAST MEDICAL HISTORY:  Significant for as above plus GERD, diverticulosis,  IBS.   PAST SURGICAL HISTORY:  Significant for a left total knee arthroplasty 39  years ago, hysterectomy, tonsillectomy, and shoulder surgery.   ALLERGIES:  None.   SOCIAL HISTORY:  The patient lives with son.  She is able to live on the  first floor.  She has 7 children, all local but work.  She quit tobacco,  occasional alcohol use.  Independent prior to admission.   PRIMARY CARE PHYSICIAN:  Dr. Harriett Sine Phifer.   REVIEW OF SYSTEMS:  Significant for reflux, gout, joint pain and swelling.   HOSPITAL COURSE:  Jennifer Owen admitted to [facility]  Department on May 13, 2002, for  comprehensive inpatient rehabilitation where  she will receive more than 3 hours therapy daily.  Overall, Jennifer Owen made  great progress during her 5-day stay.  She was made modified-independent at  the time of discharge.  During her 5-day stay, hospital course was  significant for a UTI, hypokalemia, and mildly elevated LFTs.  The patient  remained on Coumadin for DVT prophylaxis during her entire stay.  The  patient also had venous Dopplers performed by May 2004, which demonstrated  no evidence of DVT, superficial thrombus, or Baker's cyst bilaterally.  The  patient's pain has been controlled with hydrocodone as well as Tylenol.  Initial labs indicate the patient had hypokalemia and mildly elevated LFTs.  The patient was started on potassium supplement as needed and her potassium  level did normalize.  The patient had repeat LFTs performed, and AST and ALT  and alk-phos did decrease.  The patient also began complaining about  increased urinary frequency and nocturia.  Therefore, she was started on  amoxicillin empirically, and urine culture were pending at the time of  discharge.  The surgical incision was healing very well, staples were  intact, there was no drainage.  There was 1-2+ edema.  She made great  progress in rehab.  The patient was able to be discharged home in five days.   LATEST LABS:  The latest potassium level was 3.6, sodium 136, chloride 96,  CO2 33, glucose 106, BUN 8, creatinine 0.7, AST 69, ALT 110, alkaline phos  138.  Urine cultures performed on May 17, 2002, pending at the time of  discharge.   PHYSICAL THERAPY REPORT:  The patient was ambulating modified-independent 1  to 50 feet with standard walker, bed mobility modified-independent, and  transfer modified, and tendency to perform all ADLs modified.  Overall, she  made great progress and met all her goals.  The patient was able to flex her  right knee 80 degrees per CPM machine.  The staples were removed at  the time  of discharge and Steri-Strips were applied.  The patient demonstrated no  signs of infection.  The patient was discharged home with her family.   DISCHARGE MEDICATIONS:  1. Norvasc 10 mg daily.  2. HCTZ 25 mg daily.  3. __________ caplets D over-the-counter, 1 tablet daily.  4. K-Dur 20 mEq 1 tablet daily.  5. __________ 1 tablet twice daily.  6. Coumadin 5 mg in the p.m. until Jun 06, 2002.  7. Oxycodone 5 to 10 mg as needed q.4-6h for pain.  8. Amoxicillin 250 mg, 1 tablet 3 times a day for 5 more days.  9. No aspirin, ibuprofen, or Aleve while on Coumadin.  10.      Pain meds include Oxycodone and Tylenol.   ACTIVITY:  Use walker.  No driving or drinking alcohol.   DIET:  Low-salt, sugar advanced.    FOLLOWUP:  She is to have PT, OT, and a nurse to monitor Coumadin.  The  first draw will be PT-INR May 20, 2002.  She is to follow up with Dr. Dorene Grebe within 2 weeks. Follow up with primary care doctor in 4 weeks.  Monitor  LFTs and anemia.   DICTATED BY:  Drucilla Schmidt, P.A.     Junie Bame, P.A.                       Ellwood Dense, M.D.    LH/MEDQ  D:  06/09/2002  T:  06/09/2002  Job:  [id number]   cc:   G. Dorene Grebe, M.D.  [address]  [place]  [place] [address]  Fax: [phone]   Alvester Morin, M.D.  [address]  [place]  [place] [address]  Fax: [phone]

## 2010-05-28 NOTE — Discharge Summary (Signed)
NAMEBRENTLEY, Jennifer [name]:  [id number]   MEDICAL RECORD NO.:  [id number]          PATIENT TYPE:  INP   LOCATION:  3011                         FACILITY:  [facility]   PHYSICIAN:  Jennifer Jennifer Owen, M.D.    DATE OF BIRTH:  12-14-1931   DATE OF ADMISSION:  02/19/2004  DATE OF DISCHARGE:  02/23/2004                                 DISCHARGE SUMMARY   DISCHARGE DIAGNOSES:  1.  Pneumonia.  2.  Hypokalemia.  3.  Hypotension.  4.  Gastroesophageal reflux disease.  5.  Hypercholesterolemia.  6.  Osteoarthritis.   DISCHARGE MEDICATIONS:  1.  Avelox 400 mg p.o. daily.  2.  Norvasc 10 mg p.o. b.i.d.  3.  Lisinopril 20 mg p.o. daily.  4.  Diclofenac 50 mg p.o. daily p.r.n.  5.  Zocor 20 mg p.o. daily.  6.  Vicodin 5/500 one tablet p.o. q.8h. p.r.n.  7.  HCTZ 25 mg p.o. daily.  8.  Aspirin 325 mg p.o. daily.  9.  Protonix 40 mg p.o. daily.   FOLLOW-UP APPOINTMENTS:  Jennifer Jennifer Owen is to follow up with Dr. Liliane Jennifer Owen as  scheduled the week of February 20 in the Outpatient Clinic at [facility].   CONDITION ON DISCHARGE:  Much improved.   BRIEF ADMITTING HISTORY AND PHYSICAL:  Jennifer Jennifer Owen is a 77 year old woman  with a past medical history of hypertension, GERD, osteoarthritis who  presents with four days of worsening productive cough with yellow sputum,  sore throat, and headache.  She also notes vomiting x2 days and inability to  keep down p.o.'s.  She has had a fever up to 103.  She denies chest pain,  shortness of breath, diarrhea, constipation, dysuria.  She was recently seen  in the outpatient clinic and diagnosed with a upper respiratory infection  and sent home with Robitussin PM and Tylenol.  She states since that time  she has not improved any and it has prompted her to return to the emergency  department for further evaluation.   PHYSICAL EXAMINATION:  VITAL SIGNS:  Temperature 100, pulse 95, blood  pressure 83/43, respirations 20, O2 saturation 98% on room air.  GENERAL:  She was in no acute distress.  HEENT:  Pupils are equal, round, and reactive to light and accommodation.  Extraocular movements are intact.  NECK:  Supple.  No lymphadenopathy or JVD.  RESPIRATORY:  Bibasilar crackles right greater than left.  No egophony.  CARDIOVASCULAR:  Regular rate and rhythm.  No murmurs, rubs, or gallops.  ABDOMEN:  Soft, nontender, nondistended.  Positive bowel sounds.  No  hepatosplenomegaly.  EXTREMITIES:  No cords, erythema.  NEUROLOGIC:  Cranial nerves II-XII are intact.  No focal neurologic signs.   ADMISSION LABORATORIES:  Sodium 137, potassium 2.6, chloride 101,  bicarbonate 28, BUN 21, creatinine 1.2, glucose 112.  AST 28, ALT 31,  alkaline phosphatase 98, total bilirubin 0.9.  Protein 6.3, albumin 3.2,  calcium 9.5.  White count 20.7, hemoglobin 13.2, platelets 324.  Chest x-ray  showed right lower lobe haziness, questionable pneumonia.   HOSPITAL COURSE:  #1 - PNEUMONIA:  Given Jennifer Jennifer Owen' symptoms  and  leukocytosis with fever as well as x-ray findings she was admitted with a  diagnosis of pneumonia and started on IV Avelox.  Sputum and blood cultures  were obtained.  Blood cultures were negative and sputum culture revealed  only normal pharyngeal flora.  She was continued on antibiotics throughout  her admission and converted to p.o. two days prior to discharge.  She was  tolerating this regimen well, was afebrile and her white count had returned  to normal.  At the time of discharge she was saturating well on room air  both at rest and during ambulation.  She will continue antibiotics for five  days after discharge for a total 10-day course.   #2 - HYPERTENSION:  Given Jennifer Jennifer Owen' inability to keep down p.o.'s, she  was rehydrated and her hypertensive medications were held.  Her blood  pressure returned to normal, responding well to fluids.  She was restarted  on her blood pressure regimen at the time of discharge and will follow up   with her primary care Jennifer Jennifer Owen, Dr. Liliane Jennifer Owen, for further evaluation of this.   #3 - HYPOKALEMIA:  This was felt to be most likely secondary to a  combination of decreased p.o. intake in the setting of continued medication  use.  Potassium was repleted and remained normal throughout the remainder of  admission.   #4 - GASTROESOPHAGEAL REFLUX DISEASE:  Jennifer Jennifer Owen reported much better  control of her reflux symptoms while taking Protonix in the hospital.  Therefore, she will be given a one month trial of this medication to be  reevaluated by her primary care Jennifer Jennifer Owen at a future follow-up.   LABORATORIES:  Sodium 140, potassium 4.2, chloride 105, bicarbonate 28,  creatinine 0.8, glucose 98.  White count 8.5, hemoglobin 12.1, platelets  331.  Ferritin 147.      BM/MEDQ  D:  02/23/2004  T:  02/23/2004  Job:  [id number]

## 2010-05-28 NOTE — Op Note (Signed)
NAME:  Jennifer Owen, Jennifer Owen                       ACCOUNT NO.:  [id number]   MEDICAL RECORD NO.:  [id number]                   PATIENT TYPE:  INP   LOCATION:  5036                                 FACILITY:  [facility]   PHYSICIAN:  Burnard Bunting, M.D.                 DATE OF BIRTH:  1931-11-28   DATE OF PROCEDURE:  05/07/2002  DATE OF DISCHARGE:                                 OPERATIVE REPORT   PREOPERATIVE DIAGNOSIS:  Right knee arthritis.   POSTOPERATIVE DIAGNOSIS:  Right knee arthritis.   PROCEDURE:  Right total knee arthroplasty.   SURGEON:  Burnard Bunting, M.D.   ANESTHESIA:  General endotracheal.   ESTIMATED BLOOD LOSS:  100 mL   DRAINS:  Hemovac x1.   TOURNIQUET TIME:  1 hour and 32 minutes at 350 mmHg.   DESCRIPTION OF PROCEDURE:  The patient was brought to the operating room  where general endotracheal anesthesia was induced. Preoperative femoral  nerve block was induced. The right leg was prepped with duraprep solution  and draped in a sterile manner. The foot was included in the prep, the  operative field was covered with Ioban. The leg was elevated and  exsanguinated with Esmarch wrap, tourniquet was inflated. Anterior incision  was made in the right knee, skin and subcutaneous tissue was sharply  divided. A parapatellar median arthrotomy was then made. The exact point of  the incision within the extensor mechanism was marked superior to the  patella using a #0 Vicryl suture. The patella was everted, lateral  patellofemoral ligament was released. The soft tissue from the anterior  aspect of the femur was cleared, osteophytes were subsequently removed, ACL  and PCL were released. Periosteal sleeve of tissue was elevated off the  proximal medial tibia due to the patient's mild preoperative varus  deformity. The osteophytes in the proximal and medial tibia were also  removed. The intermedullary alignment guide was placed. The distal femur was  cut 12 mm at 5 degrees of  valgus. Jig demonstrated a size 7 femur which was  placed in excellent rotation parallel to the epicondylar axis. Chamfer cuts  were then made with the oscillating saw. At this time, the tibia was  prepared, size 7 was noted to fit well on the plateau and this location was  marked with the drill. The intermedullary guide was then placed  perpendicular to the mechanical axis of the tibia. This was checked visually  as well as with the alignment rod. 10 mm resection was made from the least  affected lateral side. The posterior retractor was placed. Meniscal remnants  were excised, popliteus was protected, collateral ligaments were also  protected. The box cut was then made on the femur, trial reduction was  placed with the size 7 femur, size 7 tibia and 10 and 12 mm spacers. The 12  mm spacer gave excellent stability and full range of motion with no  liftoff.  Patella tracking was excellent. At this time, correct rotation of the tibial  component was marked, tibia was then keel punched to accept a size 7 Press-  Fit prosthesis which was then cemented slightly later in the procedure. The  patella was then prepared, osteophytes were removed. An 8-10 mm resection  was then made off the patella. A size 3 universal dome patella trial was  then noted to fit well within the confines of the patella. At this time, the  trial components were removed, incision was irrigated, the size 7 tibia,  size 7 femur were cemented into position. A 12 mm spacer was placed. The 3  universal dome patella was then cemented into position and the cement was  allowed to harden. Excess cement was removed. At this time, the trial  spacing was removed and again retrialing was performed with the 10 and 12 mm  spacers. The 12 mm spacer gave full extension, full flexion and excellent  collateral stability, this was the spacer chosen. The true spacer was placed  onto the dry tibial base plate. The patella had excellent tracking  with no  thumbs technique. At this time, the tourniquet was released, bleeding points  encountered were controlled using electrocautery, Hemovac drain was placed.  The medium patellar arthrotomy was closed using #1 Vicryl interrupted figure-  of-eight suture. It was closed in a water tight fashion. Irrigation was  again used on the second layer of closure, the skin was closed using  interrupted inverted 2-0 Vicryl suture and skin staples. The patient was  then placed in a bulky dressing and knee immobilizer. She tolerated the  procedure well without any complications. Pedal pulses were palpable and the  foot was moving prior to leaving the OR.                                               Burnard Bunting, M.D.    GSD/MEDQ  D:  05/08/2002  T:  05/09/2002  Job:  [id number]

## 2010-05-31 ENCOUNTER — Ambulatory Visit (HOSPITAL_COMMUNITY): Payer: Medicare Other

## 2010-06-01 ENCOUNTER — Other Ambulatory Visit: Payer: Self-pay | Admitting: *Deleted

## 2010-06-01 ENCOUNTER — Encounter: Payer: Self-pay | Admitting: Internal Medicine

## 2010-06-01 ENCOUNTER — Ambulatory Visit (INDEPENDENT_AMBULATORY_CARE_PROVIDER_SITE_OTHER): Payer: Medicare Other | Admitting: Internal Medicine

## 2010-06-01 DIAGNOSIS — G4700 Insomnia, unspecified: Secondary | ICD-10-CM

## 2010-06-01 DIAGNOSIS — K589 Irritable bowel syndrome without diarrhea: Secondary | ICD-10-CM

## 2010-06-01 DIAGNOSIS — M545 Low back pain, unspecified: Secondary | ICD-10-CM

## 2010-06-01 DIAGNOSIS — I10 Essential (primary) hypertension: Secondary | ICD-10-CM

## 2010-06-01 NOTE — Assessment & Plan Note (Signed)
Stable.  She only takes occasional tylenol for her back pain.  One of her physicians tried Morphine, however, she said she did not like Morphine and her pain is well-controlled with NSAIDs occasionally.  I told her that if her pain becomes unbearable, we will try a different medication.

## 2010-06-01 NOTE — Assessment & Plan Note (Signed)
Well controlled.  Will continue hctz and lisinopril

## 2010-06-01 NOTE — Assessment & Plan Note (Signed)
All of her symptoms are resolved.  Denies any diarrhea, constipation, or abdominal pain.  Will continue to monitor for now.  She only needs to take Loperamide and hyoscyamine as needed for symptomatic treatment.

## 2010-06-01 NOTE — Progress Notes (Signed)
  Subjective:    Patient ID: Jennifer Owen, female    DOB: 05-Jun-1931, 77 y.o.   MRN: [id number]  HPI 77 yo woman with recent admission in April 2012 for abdominal pain which was thought to be IBS.  She returns today for follow up.  Jennifer Owen reports feeling much better after hospital discharge and all of her symptoms have resolved.  She has not been taking Loperamide or Hyoscamine since she symptoms resolved.  She does report not sleeping a lot, about 4-5 hours per day.  She goes to bed at 9:30-10 pm and normally reads or watch TV in bed.  She denies any depression or feeling fatigue the morning after.  Other than that she is feeling great and has no other complaints.  She states that she does not want to take a lot of medications if it is not needed.   Review of Systems As per HPI     Objective:   Physical Exam  Constitutional: She is oriented to person, place, and time. She appears well-developed and well-nourished.  HENT:  Head: Normocephalic.  Eyes: Conjunctivae and EOM are normal. Pupils are equal, round, and reactive to light.  Neck: Normal range of motion. Neck supple.  Cardiovascular: Normal rate, regular rhythm, normal heart sounds and intact distal pulses.   Pulmonary/Chest: Effort normal and breath sounds normal. No respiratory distress. She has no wheezes. She has no rales.  Abdominal: Soft. Bowel sounds are normal. She exhibits no distension and no mass. There is no tenderness. There is no rebound and no guarding.  Musculoskeletal: Normal range of motion. She exhibits no edema and no tenderness.  Neurological: She is alert and oriented to person, place, and time. She has normal reflexes. No cranial nerve deficit. Coordination normal.  Skin: Skin is warm and dry.  Psychiatric: She has a normal mood and affect.          Assessment & Plan:

## 2010-06-01 NOTE — Patient Instructions (Signed)
Follow up in 3-4 months or sooner if needed Take Loperamide and Hyoscyamine only as needed If you continue to have sleeping problem, please call our office.  Please try better sleep hygiene and avoid caffeine prior to bedtime.

## 2010-06-02 NOTE — Telephone Encounter (Signed)
Refusal of imodium faxed in. Pt to call clinic for problems.

## 2010-06-04 ENCOUNTER — Ambulatory Visit (HOSPITAL_COMMUNITY)
Admission: RE | Admit: 2010-06-04 | Discharge: 2010-06-04 | Disposition: A | Discharge status: Home / Self Care | Payer: Medicare Other | Source: Ambulatory Visit | Attending: Obstetrics and Gynecology | Admitting: Obstetrics and Gynecology

## 2010-06-04 DIAGNOSIS — Z1231 Encounter for screening mammogram for malignant neoplasm of breast: Secondary | ICD-10-CM | POA: Insufficient documentation

## 2010-06-21 LAB — CBC
MCHC: 33.6 g/dL (ref 30.0–36.0)
Platelets: 243 10*3/uL (ref 150–400)
RDW: 13.3 % (ref 11.5–15.5)
WBC: 5.4 10*3/uL (ref 4.0–10.5)

## 2010-06-21 LAB — DIFFERENTIAL
Basophils Absolute: 0 10*3/uL (ref 0.0–0.1)
Basophils Relative: 1 % (ref 0–1)
Eosinophils Absolute: 0.1 10*3/uL (ref 0.0–0.7)
Eosinophils Relative: 2 % (ref 0–5)
Lymphocytes Relative: 36 % (ref 12–46)

## 2010-06-29 ENCOUNTER — Other Ambulatory Visit: Payer: Self-pay | Admitting: *Deleted

## 2010-06-29 MED ORDER — SIMVASTATIN 20 MG PO TABS: 20.0000 mg | ORAL_TABLET | Freq: Every day | ORAL | Status: DC

## 2010-06-29 MED ORDER — LISINOPRIL 40 MG PO TABS: 40.0000 mg | ORAL_TABLET | Freq: Every day | ORAL | Status: DC

## 2010-07-26 NOTE — Discharge Summary (Signed)
Jennifer Owen, Jennifer [name]:  [id number]  MEDICAL RECORD NO.:  [id number]           PATIENT TYPE:  I  LOCATION:  5524                         FACILITY:  [facility]  PHYSICIAN:  Fransisco Hertz, M.D.  DATE OF BIRTH:  1931/06/26  DATE OF ADMISSION:  05/03/2010 DATE OF DISCHARGE:  05/07/2010                              DISCHARGE SUMMARY   DISCHARGE DIAGNOSES: 1. Abdominal pain likely secondary to irritable bowel syndrome.  No     clinical or radiological findings reporting diverticulitis or any     other infectious process at this point in time.  Stool cultures     pending at the time of discharge.  We will follow up at [facility] with the result in a week. 2. Loose stools likely secondary to antibiotic use because of     irritable bowel syndrome.  Clostridium difficile negative and stool     cultures pending at the time of discharge. 3. Gastroesophageal reflux disease. 4. Hypertension. 5. Low back pain. 6. Osteoarthritis. 7. History of pulmonary embolism. 8. Osteopenia. 9. Hyperlipidemia. 10.History of polio in 62 at the age of 14 years. 11.Urinary incontinence. 12.History of total knee replacement on the left in 1995 and on the     right in 2004. 13.Abdominal hysterectomy in 1985. 14.Oophorectomy in 1985.  DISCHARGE MEDICATIONS: 1. Dicyclomine 20 mg p.o. q.i.d. until the next clinic visit on May 14, 2010. 2. Loperamide 2 mg 1 tablet p.o. q.4 h. as needed for diarrhea until     the next clinic visit. 3. Nexium 40 mg capsule 1 capsule p.o. daily. 4. Glucosamine chondroitin 1500 complex 1 tablet p.o. b.i.d. 5. Hydrochlorothiazide 25 mg p.o. daily. 6. Hyoscyamine 0.125 mg sublingual tablets, place 1 tablet under     tongue every 4 hours as needed for abdominal colicky pain. 7. Lisinopril 40 mg p.o. daily. 8. Multivitamins 1 tablet p.o. daily. 9. Sulfadiazine 1% cream topically 2 times daily to affected area as     needed. 10.Zocor  20 mg p.o. daily. 11.Tylenol 650 mg p.o. q.6 h. p.r.n. for pain. 12.Maalox plus Extra Strength suspension 30 mL p.o. q.6 h. p.r.n. for     acid burn. 13.Famotidine 20 mg p.o. daily.  DISPOSITION AND FOLLOWUP:  Jennifer Owen is to be discharged in a relatively stable condition.  She is having to 2-5 watery stools for last 2 days, but has no clinical signs of infectious process.  C. diff is negative at this point of time.  Stool collected for culture awaiting for the result.  We will discharge home with the instruction to drink plenty of fluid and take loperamide as needed for diarrhea, and call the clinic if the diarrhea does not resolve in 2-3 days.  Of note, she also has an appointment on May 14, 2010, at 9:45 a.m. with Dr. Lyn Hollingshead at [facility] where we will follow up with the labs in terms of stool culture and see the clinical status if improved.  CONSULTATIONS:  None.  PRIMARY CARE PHYSICIAN:  Carrolyn Meiers, MD  CHIEF COMPLAINT:  Abdominal pain.  HISTORY OF PRESENT ILLNESS:  Jennifer Owen is a 77 year old woman who presents to [facility] with severe continued colicky abdominal pain, which is located in the right lower quadrant, sharp, 10/10, does not radiate, constant, not related to urination.  The symptoms are persistent and worsening for the last few days.  She also had some mild suprapubic pain, which was present at the time of the ED visit on April 29, 2010, which is better and gradually was felt to have resolved.  She was on cephalexin for 5 days after being seen in the ED on April 29, 2010.  She had a CT abdomen and pelvis with contrast that was notable for 3 cm area of distal colon mass, peristalsis, abscess. She has not been able to take much orally, but she denies any vomiting episodes, as such she is able to keep things down.  She lives by herself and does not feel safe to go home.  She says that the diarrhea, which she has had when  she was in the ED last time has stopped now.  She has not noticed any blood in the stool.  She denies any fever, but has not slept well for 5 days.  She has taken Vicodin without benefit.  She does not feel like to take morphine that was given to her in February 2012, as she does not want to get hooked to those medicines.  She denies any chest pain, shortness of breath, headache, vision changes, cough, with sputum production, weight loss, night sweats, or decreased appetite.  PHYSICAL EXAMINATION:  VITALS ON ADMISSION:  Temperature 98, pulse 52, blood pressure 141/82, O2 sats 98% on room air. CONSTITUTIONAL:  The patient is well developed and well nourished.  He is in no acute distress and cooperative with exam.  Alert and oriented x3. HEENT:  Head:  Normocephalic and atraumatic.  Ears:  Tympanic membranes normal bilaterally.  Mouth:  No erythema or exudates.  MMM.  Eyes: PERRLA.  EOMI.  Conjunctivae normal.  No scleral icterus. NECK:  Supple.  Trachea midline.  Normal ROM.  No JVD.  No thyromegaly or carotid bruits present. CARDIOVASCULAR:  S1 and S2 are normal.  Normal pulses.  Chest symmetric bilaterally. PULMONARY/CHEST:  CTAB.  No wheezes, rales, or rhonchi. ABDOMEN:  Soft.  Mildly tender to palpation on right lower quadrant and suprapubic area.  Nondistended, NR, NG.  Bowel sounds are normal. GU:  No CVA tenderness. MUSCULOSKELETAL:  No joint deformities, erythema, or stiffness.  ROM full and nontender. HEMATOLOGIC:  No cervical, inguinal, or axillary lymphadenopathy. NEUROLOGIC:  Alert and oriented x3.  Strength is normal and symmetric bilaterally.  Cranial nerves II-XII grossly intact.  No focal motor deficit.  Sensory intact to touch and light bilaterally. SKIN:  Warm, dry, and intact.  No rash, cyanosis, or clubbing. PSYCHIATRIC:  Normal mood and affect.  Speech and behavior is normal. Judgment and thought content normal.  Cognition and memory are normal.  LABORATORIES ON  ADMISSION:  CBC:  WBC 5.4, 13.1, hematocrit 39.0, platelets 243, ANC 2.9.  BMET:  Sodium 135, potassium 3.8, chloride 101, bicarb 25, BUN 10, creatinine 1.11, glucose 87, GFR 58, calcium 10.0, lactate 1.6.  UA negative for infection.  Fecal occult blood negative.  HOSPITAL COURSE BY PROBLEM: 1. Abdominal pain.  Ms. Alkins was having right lower quadrant     abdominal pain for about 5 days and on physical exam, was not     really impressive as  she had vague distribution of pain during     different exams.  She got better within 2 days in the hospital and     had improvement in her pain.  She started on ciprofloxacin and     Flagyl at the time of admission considering the diagnosis of     diverticulitis as she had a history of diverticulitis twice in the     past.  CT scan of the abdomen, which was repeated on the admission     did not show any signs of diverticulitis, but showed multiple     diverticula in the sigmoid and descending colon.  She also had no     signs of colitis.  She did not spike any fevers and her white cell     count was normal during this admission.  Her blood pressure also     remained stable and so without any significant clinical suspicion     for any bowel infection; ciprofloxacin and Flagyl were discontinued     after 3 days of treatment.  She also started having diarrhea about     4 times a day with loose stools.  She complained of intermittent     constipation and diarrhea for long time now and had a history of     diagnosis of irritable bowel syndrome in the past, and so, her     picture was likely due to irritable bowel syndrome, considering     normal CT scan and no signs of clinical infection and having some     diarrhea and intermittent constipation and the colicky pain.  She     was started on dicyclomine, and she responded well over 24 hours,     and she was sent home the next day.  She was having diarrhea at the     time she was sent home, but she was  advised to take Imodium every 4     hours as needed for her diarrhea and also keep up with the fluid     loss by drinking plenty of fluids in terms of water of juice or     anything and was understanding.  Her daughter was present at the     bedside and was explained about the condition and also that will     follow her as an outpatient next Friday on May 14, 2010, at 9:45     a.m. and see how she is responding. 2. Hypertension, blood pressure was well controlled during the     hospital stay, and she was continued on her home meds. 3. GERD.  She was continued on Protonix, and she had no reflux     symptoms during the hospital stay.  PROCEDURES PERFORMED:  CT of the abdomen and pelvis on May 03, 2010. Impression:  No evidence of diverticulitis.  No abscess seen.  Diffuse diverticulosis along the entirety of the colon, most prominent along the descending and sigmoid colon.  Minimal vague hypoattenuation noted in the lower pole of the right kidney, suggest clinical colonization to exclude pyelonephritis.  This may simply be artifactual in nature, given adjacent to being hardening artifact.  No evidence of contrast extrusions within the kidneys and delayed images raising question for some degree of renal insufficiency.  Mild degenerative changes along the lumbar spine.  Asymmetric prominence of the right subareolar fibroglandular tissues in correlation with prior mammogram; hence, followup mammogram would be helpful.  DISCHARGE VITALS:  Temperature 98.1, pulse 59,  respirations 18, blood pressure 111/66, O2 sats at 99% on room air.  DISCHARGE LABORATORIES:  CBC:  WBC 5.3, hemoglobin 12.0, hematocrit 35.2, platelets 236.  BMET:  Sodium 138, potassium 4.0, chloride 108, bicarb 23, BUN 8, creatinine 0.98, glucose 94.  GFR more than 60. Calcium 9.7.  Magnesium 1.7.  Stool cultures for ova and parasite pending at the time of discharge.    ______________________________ Lyn Hollingshead,  MD   ______________________________ Fransisco Hertz, M.D.    RP/MEDQ  D:  05/07/2010  T:  05/08/2010  Job:  [id number]  cc:   Carrolyn Meiers, MD  Electronically Signed by Lyn Hollingshead MD on 05/29/2010 08:25:56 PM Electronically Signed by Lina Sayre M.D. on 07/26/2010 03:24:03 PM

## 2010-08-10 ENCOUNTER — Ambulatory Visit (INDEPENDENT_AMBULATORY_CARE_PROVIDER_SITE_OTHER): Payer: Medicare Other | Admitting: Internal Medicine

## 2010-08-10 ENCOUNTER — Encounter: Payer: Self-pay | Admitting: Internal Medicine

## 2010-08-10 VITALS — BP 149/72 | HR 57 | Temp 97.1°F | Ht 65.0 in | Wt 201.7 lb

## 2010-08-10 DIAGNOSIS — R350 Frequency of micturition: Secondary | ICD-10-CM

## 2010-08-10 DIAGNOSIS — E785 Hyperlipidemia, unspecified: Secondary | ICD-10-CM

## 2010-08-10 DIAGNOSIS — K589 Irritable bowel syndrome without diarrhea: Secondary | ICD-10-CM

## 2010-08-10 DIAGNOSIS — M545 Low back pain, unspecified: Secondary | ICD-10-CM

## 2010-08-10 DIAGNOSIS — I10 Essential (primary) hypertension: Secondary | ICD-10-CM

## 2010-08-10 DIAGNOSIS — M159 Polyosteoarthritis, unspecified: Secondary | ICD-10-CM

## 2010-08-10 LAB — POCT URINALYSIS DIPSTICK
Leukocytes, UA: NEGATIVE
Nitrite, UA: NEGATIVE
Protein, UA: NEGATIVE
pH, UA: 5

## 2010-08-10 MED ORDER — HYDROCODONE-ACETAMINOPHEN 5-500 MG PO TABS: 1.0000 | ORAL_TABLET | ORAL | Status: DC | PRN

## 2010-08-10 MED ORDER — ESOMEPRAZOLE MAGNESIUM 40 MG PO CPDR: 40.0000 mg | DELAYED_RELEASE_CAPSULE | Freq: Every day | ORAL | Status: DC

## 2010-08-10 MED ORDER — LISINOPRIL 40 MG PO TABS: 40.0000 mg | ORAL_TABLET | Freq: Every day | ORAL | Status: DC

## 2010-08-10 MED ORDER — HYDROCHLOROTHIAZIDE 25 MG PO TABS: 25.0000 mg | ORAL_TABLET | Freq: Every day | ORAL | Status: DC

## 2010-08-10 NOTE — Progress Notes (Signed)
HPI: Ms. Barcelona is a 77 yo woman with PMH of IBS, HTN, HLP presents today for medication refills and right hip/back pain.  She reports having back and hip pain for many years and has osteoarthritis.  She takes Tylenol occasionally for her pain; however, she has been taking about 6 tablets of Tylenol daily in the past few days without any relief. She states that the pain is sharp, 10/10 in severity and she is not able to walk around or do anything around her apartment when the pain is there.  She would like to have a steroid joint injection which had helped her in the past.  She denies any fever, chills, SOB, chestpain, abdominal pain, or constipation.  Otherwise she has no other complaints today.  She also states that she does not like to take a lot of medications.    ROS: as per HPI  PE: General: alert, well-developed, and cooperative to examination.  Lungs: normal respiratory effort, no accessory muscle use, normal breath sounds, no crackles, and no wheezes. Heart: normal rate, regular rhythm, no murmur, no gallop, and no rub.  Abdomen: soft, non-tender, normal bowel sounds, no distention, no guarding, no rebound tenderness Msk: no joint swelling, no joint warmth, and no redness over joints.  Pulses: 2+ DP/PT pulses bilaterally Extremities: No cyanosis, clubbing, edema. Right hip and back tenderness to palpation. No erythema/drainage/increased in warmth. Neg straight leg raising bilaterally Neurologic: alert & oriented X3, cranial nerves II-XII intact, strength normal in all extremities, sensation intact to light touch, and gait normal.  Skin: turgor normal and no rashes.  Psych: Oriented X3, memory intact for recent and remote, normally interactive, good eye contact, not anxious appearing, and not depressed appearing.

## 2010-08-10 NOTE — Assessment & Plan Note (Signed)
Adequately controlled. I will continue her current medication of HCTZ 25 mg by mouth daily and enalapril 40 mg by mouth daily. I am hesitate to add another class of medication since patient does not like to take a lot of medication. -Will continue to monitor

## 2010-08-10 NOTE — Assessment & Plan Note (Signed)
Not well controlled.  Patient reported having right hip and back pain which she takes 6 Tylenol per day without much relief. -Will prescribe a short course of Vicodin 5/325 mg by mouth every 4 hours when necessary pain #60 -Will refer patient to start medicine for steroid joint injection as requested by patient

## 2010-08-10 NOTE — Assessment & Plan Note (Signed)
Well-controlled. LDL in May of 2011 was in the 66.  The patient's request, she does not want to pay a lot medication therefore I think she can be taken off of simvastatin given her well controlled LDL in the past several. -Will stop simvastatin

## 2010-08-10 NOTE — Patient Instructions (Addendum)
Please take Vicodin 5/325mg  one tablet every 4 hours as needed for your pain Stop taking Simvastatin Follow up in 3-4 months with Dr. Anselm Jungling

## 2010-08-18 ENCOUNTER — Ambulatory Visit (INDEPENDENT_AMBULATORY_CARE_PROVIDER_SITE_OTHER): Payer: Medicare Other | Admitting: Family Medicine

## 2010-08-18 DIAGNOSIS — M169 Osteoarthritis of hip, unspecified: Secondary | ICD-10-CM

## 2010-08-18 DIAGNOSIS — M545 Low back pain: Secondary | ICD-10-CM

## 2010-08-18 NOTE — Assessment & Plan Note (Signed)
We will repeat her plain films and then order steroid injections.  The current level of arthritis in her hips needs to be evaluated.  If she doesn't get relief from the injections she may need a surgical consultation depending on the level of arthritis.  Either way we will plan on physical therapy after the injections to help with her overall strength and function.  Her generalized weakness may be contributing to her pain as well.

## 2010-08-18 NOTE — Progress Notes (Signed)
  Subjective:    Patient ID: Jennifer Owen, female    DOB: 1931/03/03, 77 y.o.   MRN: [id number]  HPI 77 y/o female is here c/o bilateral hip pain for many years with a recent flare 3 weeks ago.  She says that she had a shot several years ago for a similar flare which helped and she would like to repeat this procedure.  She has never had physical therapy and is open to the idea.  She has no recent injuries or falls.  The pain is worse with walking.  She has a history of lower back pain as well.  Today she is most bothered by her hip pain.  She has bilateral knee replacements and has had no pain since.   Review of Systems     Objective:   Physical Exam  Back: She can touch her toes without difficulty  Negative straight leg raise DTR's at ankle diminished but symmetric  Hip: FABER reproduces her pain.  Equal bilaterally, 45 degrees of rotation Flexes to 80 degrees (passively) No tenderness at the greater trochanter  Gait: Normal tandem.  Patient is able to transfer from chair to exam table without assistance but is slow due to generalized weakness.       Assessment & Plan:

## 2010-08-18 NOTE — Patient Instructions (Signed)
Will call with instructions after films are reviewed.   Cell [phone] Daughter [phone]

## 2010-08-18 NOTE — Assessment & Plan Note (Signed)
This is not a main issue for her at this point.  However, we will address this with physical therapy after her hip injections.  She would benefit from core strengthening.

## 2010-08-20 ENCOUNTER — Ambulatory Visit (HOSPITAL_COMMUNITY)
Admission: RE | Admit: 2010-08-20 | Discharge: 2010-08-20 | Disposition: A | Discharge status: Home / Self Care | Payer: Medicare Other | Source: Ambulatory Visit | Attending: Family Medicine | Admitting: Family Medicine

## 2010-08-20 DIAGNOSIS — M47817 Spondylosis without myelopathy or radiculopathy, lumbosacral region: Secondary | ICD-10-CM | POA: Insufficient documentation

## 2010-08-20 DIAGNOSIS — M169 Osteoarthritis of hip, unspecified: Secondary | ICD-10-CM

## 2010-08-20 DIAGNOSIS — G8929 Other chronic pain: Secondary | ICD-10-CM | POA: Insufficient documentation

## 2010-08-20 DIAGNOSIS — M25559 Pain in unspecified hip: Secondary | ICD-10-CM | POA: Insufficient documentation

## 2010-08-26 ENCOUNTER — Other Ambulatory Visit: Payer: Self-pay | Admitting: *Deleted

## 2010-09-01 ENCOUNTER — Encounter: Payer: Self-pay | Admitting: Family Medicine

## 2010-09-01 ENCOUNTER — Ambulatory Visit (INDEPENDENT_AMBULATORY_CARE_PROVIDER_SITE_OTHER): Payer: Medicare Other | Admitting: Family Medicine

## 2010-09-01 VITALS — BP 140/84 | HR 54

## 2010-09-01 DIAGNOSIS — M461 Sacroiliitis, not elsewhere classified: Secondary | ICD-10-CM | POA: Insufficient documentation

## 2010-09-01 DIAGNOSIS — M47818 Spondylosis without myelopathy or radiculopathy, sacral and sacrococcygeal region: Secondary | ICD-10-CM

## 2010-09-01 NOTE — Progress Notes (Signed)
  Subjective:    Patient ID: Jennifer Owen, female    DOB: 1931/12/08, 77 y.o.   MRN: [id number]  HPI 77 y/o female is here to follow up for her XRAY results. Her symptoms are somewhat increased because she has been doing more walking. Her daughter is in the hospital and she has been travelling to see her daily.  Review of Systems     Objective:   Physical Exam  NAD Gait is antalgic but she is stable and able to transfer unassisted Tender to palpation over bilateral SI joints  Plain films:  Normal hip joint space bilaterally Moderate sclerosis of SI joints bilaterally, R>L      Assessment & Plan:

## 2010-09-01 NOTE — Assessment & Plan Note (Signed)
Pain was relieved previously with fluoro guided injections of the SI joints so we will repeat this treatment.

## 2010-09-01 NOTE — Patient Instructions (Signed)
Patient was given instructions for her appointment tomorrow at [facility] imaging on a separate document.

## 2010-09-02 ENCOUNTER — Ambulatory Visit
Admission: RE | Admit: 2010-09-02 | Discharge: 2010-09-02 | Disposition: A | Discharge status: Home / Self Care | Payer: PRIVATE HEALTH INSURANCE | Source: Ambulatory Visit | Attending: Family Medicine | Admitting: Family Medicine

## 2010-09-02 ENCOUNTER — Other Ambulatory Visit: Payer: Self-pay | Admitting: Family Medicine

## 2010-09-02 ENCOUNTER — Other Ambulatory Visit: Payer: Medicare Other

## 2010-09-02 DIAGNOSIS — M47818 Spondylosis without myelopathy or radiculopathy, sacral and sacrococcygeal region: Secondary | ICD-10-CM

## 2010-09-02 DIAGNOSIS — M461 Sacroiliitis, not elsewhere classified: Secondary | ICD-10-CM

## 2010-09-02 MED ORDER — IOHEXOL 180 MG/ML  SOLN
1.0000 mL | Freq: Once | INTRAMUSCULAR | Status: AC | PRN
  Administered 2010-09-02: 1 mL via INTRA_ARTICULAR

## 2010-09-02 MED ORDER — METHYLPREDNISOLONE ACETATE 40 MG/ML INJ SUSP (RADIOLOG
120.0000 mg | Freq: Once | INTRAMUSCULAR | Status: AC
  Administered 2010-09-02: 120 mg via INTRA_ARTICULAR

## 2010-09-14 ENCOUNTER — Telehealth: Payer: Self-pay | Admitting: *Deleted

## 2010-09-14 NOTE — Telephone Encounter (Signed)
Pt calls and states something is wrong w/ her L foot, possibly her great toe, pt's phone was breaking up and she states she has no other phone to use, she desires an appt and it is given for later in the week she is agreeable to this.

## 2010-09-16 ENCOUNTER — Ambulatory Visit: Payer: PRIVATE HEALTH INSURANCE | Admitting: Internal Medicine

## 2010-09-20 ENCOUNTER — Encounter: Payer: Self-pay | Admitting: *Deleted

## 2010-09-20 ENCOUNTER — Encounter: Payer: PRIVATE HEALTH INSURANCE | Admitting: Internal Medicine

## 2010-09-20 ENCOUNTER — Encounter: Payer: Self-pay | Admitting: Internal Medicine

## 2010-09-20 ENCOUNTER — Ambulatory Visit (INDEPENDENT_AMBULATORY_CARE_PROVIDER_SITE_OTHER): Payer: PRIVATE HEALTH INSURANCE | Admitting: Internal Medicine

## 2010-09-20 DIAGNOSIS — M159 Polyosteoarthritis, unspecified: Secondary | ICD-10-CM

## 2010-09-20 DIAGNOSIS — M461 Sacroiliitis, not elsewhere classified: Secondary | ICD-10-CM

## 2010-09-20 DIAGNOSIS — M47818 Spondylosis without myelopathy or radiculopathy, sacral and sacrococcygeal region: Secondary | ICD-10-CM

## 2010-09-20 DIAGNOSIS — I10 Essential (primary) hypertension: Secondary | ICD-10-CM

## 2010-09-20 DIAGNOSIS — M7989 Other specified soft tissue disorders: Secondary | ICD-10-CM

## 2010-09-20 DIAGNOSIS — M545 Low back pain: Secondary | ICD-10-CM

## 2010-09-20 DIAGNOSIS — K589 Irritable bowel syndrome without diarrhea: Secondary | ICD-10-CM

## 2010-09-20 MED ORDER — ESOMEPRAZOLE MAGNESIUM 40 MG PO CPDR: 40.0000 mg | DELAYED_RELEASE_CAPSULE | Freq: Every day | ORAL | Status: DC

## 2010-09-20 MED ORDER — HYDROCODONE-ACETAMINOPHEN 5-500 MG PO TABS: 1.0000 | ORAL_TABLET | ORAL | Status: DC | PRN

## 2010-09-20 NOTE — Assessment & Plan Note (Signed)
Blood pressure within normal limits, no change in medications.

## 2010-09-20 NOTE — Progress Notes (Unsigned)
Pt presents this am stating she started taking 2 new meds Friday and she has had abd pain all weekend, she is quite uncomfortable and requests a visit, pt is placed in 1045 slot for dr patel, she has her meds w/ her.

## 2010-09-20 NOTE — Progress Notes (Signed)
Managed the patient with Dr. Aundria Rud.  Thanks, Daleen Bo.

## 2010-09-20 NOTE — Patient Instructions (Addendum)
Please follow up with Dr. Anselm Jungling in 4-5 months or earlier if needed. Please stop taking colchicine and celebrex as your gout attack seems to be resolved. Continue taking vicodin for pain as needed. Please make to the appointment with Podiatrist- foot doctor. The office will call you with appointment day and time.

## 2010-09-20 NOTE — Progress Notes (Signed)
  Subjective:    Patient ID: Jennifer Owen, female    DOB: [birth year], 77 y.o.   MRN: [id number]  HPI Jennifer Owen is a pleasant 33 year woman with past medical history of IBS, osteoarthritis, hypertension who comes to the clinic due to abdominal pain after starting medications for her gout on last Friday. She was seen at Dr. Henriette Combs office, sports medicine-on last Friday and was diagnosed with a first gout attack of left great toe and was prescribed colchicine and Celebrex. She took 2 tablets of colchicine and then took one tablet every 4 hours on Friday and started having crampy abdominal pain and had some diarrhea-for which she took Imodium and got better than she had no diarrhea after that. Her toe swelling and pain is much better today after taking the medications. She denies any fever, chills, night sweats, weight loss, nausea, vomiting. She does have mild crampy abdominal pain, intermittent, does not get worse or better with anything- but denies any diarrhea or abdominal distention. On exam she has a 1 x 1 cm subcutaneous, hard, swelling of her left great toe on the dorsal aspect, mildly tender, not involving the joint, no open wound or drainage. She says that she was at the beach in [place] and since then she noticed the bump on her left great toe, but was not this big before the recent gout attack. She does not remember hitting her toe or any bites. She did cover it feet in hot sand for a while at the beach. She has been left great toenail removed was 15 years before and never grew back. She denies any chest pain, short of breath, headache, vision changes,.   Review of Systems    as per history of present illness, all other systems reviewed negative. Objective:   Physical Exam Constitutional: Vital signs reviewed.  Patient is a well-developed and well-nourished in no acute distress and cooperative with exam. Alert and oriented x3.  Head: Normocephalic and atraumatic Mouth: no erythema  or exudates, MMM Eyes: PERRL, EOMI, conjunctivae normal, No scleral icterus.  Neck: Supple, Trachea midline normal ROM, No JVD, mass, thyromegaly, or carotid bruit present.  Cardiovascular: RRR, S1 normal, S2 normal, no MRG, pulses symmetric and intact bilaterally Pulmonary/Chest: CTAB, no wheezes, rales, or rhonchi Abdominal: Soft. Non-tender, non-distended, bowel sounds are normal, no masses, organomegaly, or guarding present.  Extremities : Left great toe dorsal subcutaneous hard swelling, 1 x 1 cm, nonfluctuating, mildly red, no opening or drainage, mildly tender at Center, no PIP or MTP joint pain or swelling. Right foot exam normal.  Neurological: A&O x3, Strenght is normal and symmetric bilaterally, cranial nerve II-XII are grossly intact, no focal motor deficit, sensory intact to light touch bilaterally.  Psychiatric: Normal mood and affect. speech and behavior is normal. Judgment and thought content normal. Cognition and memory are normal.          Assessment & Plan:

## 2010-09-20 NOTE — Assessment & Plan Note (Signed)
No new complaints, followed by Dr. Laural Benes.

## 2010-09-20 NOTE — Assessment & Plan Note (Signed)
As described in history of present illness and physical exam patient has left great toe subcutaneous swelling for about 2 months now- but was stable until she had gout attack. He does not seem to be an abscess or septic joint as movement of the joint or the patient does not produce tenderness. Since the swelling is big and is being for about 2 months now, I would refer her to a Podiatrist for further evaluation and management. She's never been evaluated by a podiatrist in past.

## 2010-10-18 LAB — CBC
HCT: 36.9
Hemoglobin: 12.6
MCHC: 34
MCV: 93.1
RBC: 3.96
RDW: 13.8

## 2010-10-18 LAB — I-STAT 8, (EC8 V) (CONVERTED LAB)
Acid-Base Excess: 3 — ABNORMAL HIGH
Bicarbonate: 28.2 — ABNORMAL HIGH
Glucose, Bld: 122 — ABNORMAL HIGH
Potassium: 3.5
TCO2: 29
pCO2, Ven: 42.5 — ABNORMAL LOW
pH, Ven: 7.43 — ABNORMAL HIGH

## 2010-10-18 LAB — DIFFERENTIAL
Basophils Absolute: 0
Basophils Relative: 0
Eosinophils Absolute: 0 — ABNORMAL LOW
Eosinophils Relative: 0
Monocytes Absolute: 0.6
Monocytes Relative: 6

## 2010-10-18 LAB — CLOSTRIDIUM DIFFICILE EIA

## 2010-10-19 LAB — CBC
HCT: 31.4 — ABNORMAL LOW
HCT: 31.7 — ABNORMAL LOW
HCT: 33.1 — ABNORMAL LOW
HCT: 33.2 — ABNORMAL LOW
Hemoglobin: 10.5 — ABNORMAL LOW
Hemoglobin: 10.6 — ABNORMAL LOW
Hemoglobin: 10.7 — ABNORMAL LOW
Hemoglobin: 10.9 — ABNORMAL LOW
Hemoglobin: 11 — ABNORMAL LOW
Hemoglobin: 11 — ABNORMAL LOW
Hemoglobin: 11.1 — ABNORMAL LOW
Hemoglobin: 12.2
MCHC: 33.3
MCHC: 33.3
MCHC: 33.4
MCHC: 33.7
MCV: 92.6
MCV: 93.5
MCV: 95.1
MCV: 95.2
Platelets: 218
Platelets: 233
Platelets: 240
Platelets: 255
Platelets: 268
RBC: 3.41 — ABNORMAL LOW
RBC: 3.46 — ABNORMAL LOW
RBC: 3.49 — ABNORMAL LOW
RBC: 3.51 — ABNORMAL LOW
RBC: 3.53 — ABNORMAL LOW
RBC: 3.84 — ABNORMAL LOW
RDW: 13.4
RDW: 13.4
RDW: 13.4
RDW: 13.5
RDW: 13.6
RDW: 13.7
RDW: 13.7
RDW: 13.8
WBC: 10.5
WBC: 5.1
WBC: 5.7
WBC: 8.6

## 2010-10-19 LAB — BASIC METABOLIC PANEL
BUN: 3 — ABNORMAL LOW
BUN: 3 — ABNORMAL LOW
BUN: 5 — ABNORMAL LOW
BUN: 7
CO2: 21
CO2: 25
Calcium: 8.4
Calcium: 9.1
Calcium: 9.2
Calcium: 9.3
Chloride: 104
Chloride: 113 — ABNORMAL HIGH
Creatinine, Ser: 0.82
Creatinine, Ser: 0.83
Creatinine, Ser: 0.88
Creatinine, Ser: 0.9
Creatinine, Ser: 0.94
GFR calc Af Amer: >60
GFR calc Af Amer: >60
GFR calc Af Amer: >60
GFR calc Af Amer: >60
GFR calc non Af Amer: >60
GFR calc non Af Amer: >60
GFR calc non Af Amer: >60
GFR calc non Af Amer: >60
GFR calc non Af Amer: >60
GFR calc non Af Amer: >60
Glucose, Bld: 100 — ABNORMAL HIGH
Glucose, Bld: 106 — ABNORMAL HIGH
Glucose, Bld: 92
Glucose, Bld: 95
Potassium: 3.4 — ABNORMAL LOW
Potassium: 3.6
Sodium: 137
Sodium: 140
Sodium: 143
Sodium: 144
Sodium: 144

## 2010-10-19 LAB — FECAL LACTOFERRIN, QUANT: Fecal Lactoferrin: NEGATIVE

## 2010-10-19 LAB — OCCULT BLOOD X 1 CARD TO LAB, STOOL
Fecal Occult Bld: NEGATIVE
Fecal Occult Bld: POSITIVE

## 2010-10-19 LAB — DIFFERENTIAL
Lymphocytes Relative: 32
Lymphs Abs: 1.6
Neutro Abs: 2.8
Neutrophils Relative %: 55

## 2010-10-19 LAB — LACTIC ACID, PLASMA: Lactic Acid, Venous: 1

## 2010-10-19 LAB — CLOSTRIDIUM DIFFICILE EIA

## 2010-10-19 LAB — STOOL CULTURE

## 2010-10-19 LAB — PROTIME-INR: INR: 1

## 2010-10-19 LAB — MAGNESIUM: Magnesium: 1.3 — ABNORMAL LOW

## 2010-10-19 LAB — LACTATE DEHYDROGENASE: LDH: 138

## 2010-10-20 LAB — URINALYSIS, ROUTINE W REFLEX MICROSCOPIC
Bilirubin Urine: NEGATIVE
Hgb urine dipstick: NEGATIVE
Nitrite: NEGATIVE
Specific Gravity, Urine: 1.025
pH: 5

## 2010-10-20 LAB — COMPREHENSIVE METABOLIC PANEL
ALT: 17
AST: 20
Albumin: 3.3 — ABNORMAL LOW
CO2: 27
Chloride: 110
GFR calc Af Amer: >60
GFR calc non Af Amer: >60
Sodium: 145
Total Bilirubin: 0.6

## 2010-10-20 LAB — CBC
Platelets: 294
RBC: 4.38
WBC: 8.8

## 2010-10-20 LAB — DIFFERENTIAL
Basophils Absolute: 0
Eosinophils Absolute: 0
Eosinophils Relative: 0
Lymphocytes Relative: 15
Lymphs Abs: 1.3
Monocytes Absolute: 0.4

## 2010-10-20 LAB — AMYLASE: Amylase: 72

## 2010-10-20 LAB — LIPASE, BLOOD: Lipase: 33

## 2010-10-22 NOTE — Progress Notes (Signed)
Addended by: Bufford Spikes on: 10/22/2010 11:21 AM   Modules accepted: Orders

## 2011-02-03 ENCOUNTER — Encounter: Payer: Self-pay | Admitting: Internal Medicine

## 2011-02-03 ENCOUNTER — Ambulatory Visit (INDEPENDENT_AMBULATORY_CARE_PROVIDER_SITE_OTHER): Payer: PRIVATE HEALTH INSURANCE | Admitting: Internal Medicine

## 2011-02-03 DIAGNOSIS — G8929 Other chronic pain: Secondary | ICD-10-CM | POA: Insufficient documentation

## 2011-02-03 DIAGNOSIS — M159 Polyosteoarthritis, unspecified: Secondary | ICD-10-CM

## 2011-02-03 DIAGNOSIS — M545 Low back pain: Secondary | ICD-10-CM

## 2011-02-03 DIAGNOSIS — M79609 Pain in unspecified limb: Secondary | ICD-10-CM

## 2011-02-03 DIAGNOSIS — M2550 Pain in unspecified joint: Secondary | ICD-10-CM

## 2011-02-03 DIAGNOSIS — M169 Osteoarthritis of hip, unspecified: Secondary | ICD-10-CM

## 2011-02-03 DIAGNOSIS — I10 Essential (primary) hypertension: Secondary | ICD-10-CM

## 2011-02-03 DIAGNOSIS — M47818 Spondylosis without myelopathy or radiculopathy, sacral and sacrococcygeal region: Secondary | ICD-10-CM

## 2011-02-03 DIAGNOSIS — E785 Hyperlipidemia, unspecified: Secondary | ICD-10-CM

## 2011-02-03 LAB — LIPID PANEL
Cholesterol: 190 mg/dL (ref 0–200)
HDL: 88 mg/dL (ref >39)
Total CHOL/HDL Ratio: 2.2 Ratio
Triglycerides: 60 mg/dL (ref <150)

## 2011-02-03 LAB — COMPREHENSIVE METABOLIC PANEL
AST: 18 U/L (ref 0–37)
Albumin: 4.3 g/dL (ref 3.5–5.2)
BUN: 25 mg/dL — ABNORMAL HIGH (ref 6–23)
Calcium: 10.1 mg/dL (ref 8.4–10.5)
Chloride: 107 mEq/L (ref 96–112)
Creat: 1.07 mg/dL (ref 0.50–1.10)
Glucose, Bld: 77 mg/dL (ref 70–99)
Potassium: 4.4 mEq/L (ref 3.5–5.3)

## 2011-02-03 MED ORDER — ACETAMINOPHEN 325 MG PO TABS: 650.0000 mg | ORAL_TABLET | Freq: Four times a day (QID) | ORAL | Status: DC | PRN

## 2011-02-03 NOTE — Assessment & Plan Note (Signed)
Patient had history of polio at age 78 . Reviewing literature it is reported that patient can develop Post -Polio Syndrome which can occur decated later and it is associated with focal or general muscle atrophy ,fatigue and pain as well as decreased ambulatory ability.  This may be a part  a long with arthritis  causing patient's chronic joint pains. I will refer patient to physical therapy which is specially oriented to post polio victims. Furthermore recommended to take Tylenol for pain and discontinue Vicodin since this is not effective. She has tried Glucosamine Chondr but it caused her significant abdomen discomfort.

## 2011-02-03 NOTE — Assessment & Plan Note (Signed)
Blood pressure elevated today but patient was complaining about pain therefore I did not change her medication. Reevaluate during the next office visit.

## 2011-02-03 NOTE — Progress Notes (Signed)
Subjective:   Patient ID: Jennifer Owen female   DOB: Jan 03, 1932 77 y.o.   MRN: [id number]  HPI: Jennifer Owen is a 78 y.o. female with past medical history significant as outlined below who presented to the clinic for pain all over her body. Patient reports pain in her shoulders her wrist, arm back and knee pain it has been worsening significantly. She was prescribed Vicodin which has not improved her pain and she okay she takes Tylenol. Patient reports to me that she had polio at age 54 and was noted a paternal walk for 2 weeks years. She denies any fevers or chills, fatigue, weight loss but reported about cramps in her legs.    Past Medical History  Diagnosis Date  . GERD (gastroesophageal reflux disease)     Papulous gastropathy EGD Oct. 2007  . Hypertension   . Low back pain     intermittent radiculopathy  . Osteoarthritis   . Pulmonary embolism     2/2 knee replacement  . Osteopenia     T score-1.1 R Fremur, - 0.4 L spine  . Hyperlipemia   . Polio 1942  . Urinary incontinence    Current Outpatient Prescriptions  Medication Sig Dispense Refill  . simvastatin (ZOCOR) 20 MG tablet Take 20 mg by mouth every evening.      Marland Kitchen acetaminophen (TYLENOL) 325 MG tablet Take 2 tablets (650 mg total) by mouth every 6 (six) hours as needed for pain.  180 tablet  1  . aspirin 81 MG chewable tablet Chew 81 mg by mouth daily.        Marland Kitchen esomeprazole (NEXIUM) 40 MG capsule Take 1 capsule (40 mg total) by mouth daily.  30 capsule  11  . hydrochlorothiazide 25 MG tablet Take 1 tablet (25 mg total) by mouth daily.  30 tablet  11  . lisinopril (PRINIVIL,ZESTRIL) 40 MG tablet Take 1 tablet (40 mg total) by mouth daily.  30 tablet  11  . Multiple Vitamins-Minerals (CENTRUM SILVER PO) Take 1 tablet by mouth daily.         Family History  Problem Relation Age of Onset  . Cancer Daughter     colon caner, <60   History   Social History  . Marital Status: Legally Separated    Spouse  Name: N/A    Number of Children: 7  . Years of Education: N/A   Occupational History  . 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 Jennifer Owen    Social History Main Topics  . Smoking status: Former Games developer  . Smokeless tobacco: None  . Alcohol Use: No  . Drug Use: No  . Sexually Active: None   Other Topics Concern  . None   Social History Narrative   [facility]   Review of Systems: Constitutional: Denies fever, chills, diaphoresis, appetite change and fatigue.   Respiratory: Denies SOB, DOE, cough, chest tightness,  and wheezing.   Cardiovascular: Denies chest pain, palpitations and leg swelling.  Gastrointestinal: Denies nausea, vomiting, abdominal pain, diarrhea, constipation,  Genitourinary: Denies dysuria, urgency, frequency, hematuria, flank pain and difficulty urinating.  Musculoskeletal: Noted myalgias, back pain, joint swelling, arthralgias and gait problem.  Skin: Denies pallor, rash and wound.  Neurological: Denies dizziness,  numbness and headaches.    Objective:  Physical Exam: Filed Vitals:   02/03/11 0835  BP: 144/70  Pulse: 50  Temp: 97.6 F (36.4 C)  TempSrc: Oral  Height: 5\' 6"  (1.676 m)  Weight:  206 lb 4.8 oz (93.577 kg)   Constitutional: Vital signs reviewed.  Patient is a well-developed and well-nourished  in no acute distress and cooperative with exam. Alert and oriented x3.  Neck: Supple,  Cardiovascular: RRR, S1 normal, S2 normal, no MRG, pulses symmetric and intact bilaterally Pulmonary/Chest: CTAB, no wheezes, rales, or rhonchi Abdominal: Soft. Non-tender, non-distended, bowel sounds are normal,  Musculoskeletal: Joint deformities of knee,Decreases ROM and stiffness as well as tender to palpation in shoulder and knee.  But no erythema.  Neurological: A&O x3, Strenght is normal and symmetric bilaterally, no focal motor deficit, sensory intact to light touch bilaterally.  Skin: Warm, dry and intact. No rash, cyanosis, or clubbing.

## 2011-02-03 NOTE — Assessment & Plan Note (Signed)
Patient was supposed to stop simvastatin per note from July of 2012 patient continued to take it. Our check lipid panel and Cmet today and manage accordingly. Further also obtain CPK level since patient is complaining about muscle cramping in her legs.

## 2011-02-18 ENCOUNTER — Ambulatory Visit: Payer: PRIVATE HEALTH INSURANCE | Attending: Internal Medicine | Admitting: Physical Therapy

## 2011-02-18 DIAGNOSIS — M6281 Muscle weakness (generalized): Secondary | ICD-10-CM | POA: Insufficient documentation

## 2011-02-18 DIAGNOSIS — R269 Unspecified abnormalities of gait and mobility: Secondary | ICD-10-CM | POA: Insufficient documentation

## 2011-02-18 DIAGNOSIS — IMO0001 Reserved for inherently not codable concepts without codable children: Secondary | ICD-10-CM | POA: Insufficient documentation

## 2011-02-23 ENCOUNTER — Ambulatory Visit: Payer: PRIVATE HEALTH INSURANCE | Admitting: Physical Therapy

## 2011-02-25 ENCOUNTER — Telehealth: Payer: Self-pay | Admitting: *Deleted

## 2011-02-25 ENCOUNTER — Ambulatory Visit: Payer: PRIVATE HEALTH INSURANCE | Admitting: Physical Therapy

## 2011-02-25 NOTE — Telephone Encounter (Signed)
Pt needs statement allowing her to participate in water aerobics, it needs to be faxed to neuro rehab on [address], [phone]

## 2011-02-28 ENCOUNTER — Ambulatory Visit: Payer: PRIVATE HEALTH INSURANCE | Admitting: Physical Therapy

## 2011-03-02 ENCOUNTER — Ambulatory Visit: Payer: PRIVATE HEALTH INSURANCE | Admitting: Physical Therapy

## 2011-03-08 ENCOUNTER — Ambulatory Visit: Payer: PRIVATE HEALTH INSURANCE | Admitting: Physical Therapy

## 2011-03-10 ENCOUNTER — Ambulatory Visit: Payer: PRIVATE HEALTH INSURANCE | Admitting: Physical Therapy

## 2011-03-11 ENCOUNTER — Encounter: Payer: PRIVATE HEALTH INSURANCE | Admitting: Internal Medicine

## 2011-03-14 ENCOUNTER — Ambulatory Visit: Payer: PRIVATE HEALTH INSURANCE | Attending: Internal Medicine | Admitting: Physical Therapy

## 2011-03-14 ENCOUNTER — Ambulatory Visit: Payer: PRIVATE HEALTH INSURANCE | Admitting: Occupational Therapy

## 2011-03-14 DIAGNOSIS — R269 Unspecified abnormalities of gait and mobility: Secondary | ICD-10-CM | POA: Insufficient documentation

## 2011-03-14 DIAGNOSIS — M6281 Muscle weakness (generalized): Secondary | ICD-10-CM | POA: Insufficient documentation

## 2011-03-14 DIAGNOSIS — M25619 Stiffness of unspecified shoulder, not elsewhere classified: Secondary | ICD-10-CM | POA: Insufficient documentation

## 2011-03-14 DIAGNOSIS — M25519 Pain in unspecified shoulder: Secondary | ICD-10-CM | POA: Insufficient documentation

## 2011-03-14 DIAGNOSIS — Z5189 Encounter for other specified aftercare: Secondary | ICD-10-CM | POA: Insufficient documentation

## 2011-03-17 ENCOUNTER — Ambulatory Visit: Payer: PRIVATE HEALTH INSURANCE | Admitting: Occupational Therapy

## 2011-03-17 ENCOUNTER — Encounter: Payer: PRIVATE HEALTH INSURANCE | Admitting: Occupational Therapy

## 2011-03-17 ENCOUNTER — Ambulatory Visit: Payer: PRIVATE HEALTH INSURANCE | Admitting: Physical Therapy

## 2011-03-21 ENCOUNTER — Ambulatory Visit: Payer: PRIVATE HEALTH INSURANCE | Admitting: Physical Therapy

## 2011-03-21 ENCOUNTER — Ambulatory Visit: Payer: PRIVATE HEALTH INSURANCE | Admitting: Occupational Therapy

## 2011-03-22 ENCOUNTER — Ambulatory Visit (INDEPENDENT_AMBULATORY_CARE_PROVIDER_SITE_OTHER): Payer: PRIVATE HEALTH INSURANCE | Admitting: Internal Medicine

## 2011-03-22 ENCOUNTER — Encounter: Payer: Self-pay | Admitting: Internal Medicine

## 2011-03-22 VITALS — BP 154/93 | HR 54 | Temp 96.6°F | Wt 211.2 lb

## 2011-03-22 DIAGNOSIS — I10 Essential (primary) hypertension: Secondary | ICD-10-CM

## 2011-03-22 DIAGNOSIS — M47818 Spondylosis without myelopathy or radiculopathy, sacral and sacrococcygeal region: Secondary | ICD-10-CM

## 2011-03-22 DIAGNOSIS — M2550 Pain in unspecified joint: Secondary | ICD-10-CM

## 2011-03-22 DIAGNOSIS — E876 Hypokalemia: Secondary | ICD-10-CM

## 2011-03-22 DIAGNOSIS — G8929 Other chronic pain: Secondary | ICD-10-CM

## 2011-03-22 DIAGNOSIS — G4700 Insomnia, unspecified: Secondary | ICD-10-CM

## 2011-03-22 DIAGNOSIS — E785 Hyperlipidemia, unspecified: Secondary | ICD-10-CM

## 2011-03-22 MED ORDER — OXYCODONE-ACETAMINOPHEN 5-325 MG PREPACK: 1.0000 | ORAL_TABLET | Freq: Three times a day (TID) | ORAL | Status: DC | PRN

## 2011-03-22 MED ORDER — AMLODIPINE BESYLATE 10 MG PO TABS: 10.0000 mg | ORAL_TABLET | Freq: Every day | ORAL | Status: DC

## 2011-03-22 MED ORDER — TRAZODONE HCL 50 MG PO TABS: 50.0000 mg | ORAL_TABLET | Freq: Every evening | ORAL | Status: DC | PRN

## 2011-03-22 MED ORDER — DICLOFENAC SODIUM 1 % TD GEL: 1.0000 "application " | Freq: Four times a day (QID) | TRANSDERMAL | Status: DC

## 2011-03-22 MED ORDER — OXYCODONE-ACETAMINOPHEN 5-325 MG PO TABS: 1.0000 | ORAL_TABLET | Freq: Three times a day (TID) | ORAL | Status: AC | PRN

## 2011-03-22 NOTE — Assessment & Plan Note (Signed)
patient continues to report insomnia especially having trouble staying asleep. She has been sleeping 1-2 hours around 10 PM and then wake up in the middle of the night and could not fall back to sleep. She reports good sleep hygiene and denies any caffeine or watching TV in bed. She tries to eat dinner before 6 PM. At this time, she does want to try some medication to help her sleep and states that she does not want to be on this medication long-term. -Will give low-dose trazodone 50 mg by mouth each bedtime when necessary

## 2011-03-22 NOTE — Assessment & Plan Note (Signed)
Not well controlled. Repeat blood pressure was still in the 150/90's. -Will add Norvasc 10 mg by mouth daily -Continue lisinopril 40 mg by mouth daily -Continue HCTZ 25 mg by mouth daily -I will also check her BMP today to make sure that her electrolytes are within normal limit, patient does complain of muscle cramps in her legs and that she does have a history of hypokalemia

## 2011-03-22 NOTE — Assessment & Plan Note (Signed)
Well-controlled. Last lipid panel was in January of 2013 showed an LDL of 90. Patient wished to be taken off of this medication in the past because she does not want to take a lot of medication. -We will stop simvastatin at this time continue to monitor her lipid panel yearly

## 2011-03-22 NOTE — Telephone Encounter (Signed)
Patient may participate in water aerobics. Please fax letter

## 2011-03-22 NOTE — Progress Notes (Addendum)
HPI: [name] is a 78 year old woman with past medical history of GERD, hypertension, multiple joints upper osteoarthritis, previous PE, hyperlipidemia, polio presents today for followup.  She is doing well with neuro-rehabilitation and will be attending [employer] twice per week and exercise at home. She would like to have a letter stating that she is ok to participate aerobic water exercise at the [facility].  She is not able to staying asleep x 1 month.  She denies any caffeine before bedtime.  She only drinks tea during the day. Reports good sleep hygiene.  She wants to try some meds short term to see if she can get back her sleeping pattern.  Still having some legs cramping bilaterally.  She has been eating bananas.  Had low potassium level 2/2 hctz.  Still has pain in her knees and shoulder, Tylenol not working as well.  She is allergic to Tramadol: has swelling.  She denies any shortness of breath, chest pain, nausea vomiting, abdominal pain, fever or chills.  ROS: as per HPI  PE: General: alert, well-developed, and cooperative to examination.  Lungs: normal respiratory effort, no accessory muscle use, normal breath sounds, no crackles, and no wheezes. Heart: normal rate, regular rhythm, no murmur, no gallop, and no rub.  Abdomen: soft, non-tender, normal bowel sounds, no distention, no guarding, no rebound tenderness  Pulses: 2+ DP/PT pulses bilaterally Extremities: Lower extremities bilaterally: Limited range of motion. There is mild effusion on the right knee but no erythema, increased in warmth, drainage. There is mild tenderness to palpation of the knee joints. Also there is limited range of motion of the shoulders.  Neurologic: alert & oriented X3, cranial nerves II-XII intact, strength normal in all extremities, sensation intact to light touch, and gait normal. .  Psych: Appropriate

## 2011-03-22 NOTE — Assessment & Plan Note (Signed)
She reports that physical therapy does help. Although she still does have pain and that Tylenol is not working as well as before. She is allergic to tramadol and does not want to try other systemic NSAIDs because fear of kidney problem since her Cr is on the upper limit of normal. On x-ray imaging, patient does have multiple arthritis in her joints -Will try Voltaren gel apply 4 times daily to the affected areas -Given the chronic nature of her pain and her intolerant of tramadol as well as other NSAIDs and Tylenol, will have patient sign a pain contract.  Patient is unable to carry out normal activities of daily living without pain medication. -Patient signed a pain contract for Percocet 5/325 mg by mouth q. 8 hours when necessary pain #60. Patient understood that this medication may give her drowsiness. I do not think that this patient  have the potential to abuse narcotics. -Continue physical therapy/water aerobics at the [facility] -Will follow up with patient in 3 months

## 2011-03-22 NOTE — Patient Instructions (Addendum)
Stop taking Tylenol Start taking Percocet 5/325mg  one tablet every 8 hours as needed for pain Start applying Voltaren gel to  Affected areas 4 times daily You can stop taking Simvastatin Will get labs today and I will call you with any abnormal lab results Start taking Norvasc 10mg  one tablet daily for your blood pressure Start taking Trazodone 50mg  one tablet at bedtime as needed for your sleep Follow up with Dr. Anselm Jungling in 3 months

## 2011-03-23 ENCOUNTER — Encounter: Payer: Self-pay | Admitting: *Deleted

## 2011-03-23 LAB — BASIC METABOLIC PANEL WITH GFR
BUN: 18 mg/dL (ref 6–23)
CO2: 26 mEq/L (ref 19–32)
Chloride: 106 mEq/L (ref 96–112)
Creat: 0.87 mg/dL (ref 0.50–1.10)
GFR, Est Non African American: 64 mL/min
Glucose, Bld: 91 mg/dL (ref 70–99)
Potassium: 4.5 mEq/L (ref 3.5–5.3)

## 2011-03-24 ENCOUNTER — Ambulatory Visit: Payer: PRIVATE HEALTH INSURANCE | Admitting: Occupational Therapy

## 2011-03-24 ENCOUNTER — Ambulatory Visit: Payer: PRIVATE HEALTH INSURANCE | Admitting: Physical Therapy

## 2011-03-28 ENCOUNTER — Ambulatory Visit: Payer: PRIVATE HEALTH INSURANCE | Admitting: Occupational Therapy

## 2011-03-28 ENCOUNTER — Ambulatory Visit: Payer: PRIVATE HEALTH INSURANCE | Admitting: Physical Therapy

## 2011-03-30 ENCOUNTER — Ambulatory Visit: Payer: PRIVATE HEALTH INSURANCE | Admitting: Occupational Therapy

## 2011-03-30 ENCOUNTER — Ambulatory Visit: Payer: PRIVATE HEALTH INSURANCE | Admitting: Physical Therapy

## 2011-04-04 ENCOUNTER — Encounter: Payer: PRIVATE HEALTH INSURANCE | Admitting: Occupational Therapy

## 2011-04-04 ENCOUNTER — Ambulatory Visit: Payer: PRIVATE HEALTH INSURANCE | Admitting: Physical Therapy

## 2011-04-06 ENCOUNTER — Encounter: Payer: PRIVATE HEALTH INSURANCE | Admitting: Occupational Therapy

## 2011-04-06 ENCOUNTER — Ambulatory Visit: Payer: PRIVATE HEALTH INSURANCE | Admitting: Physical Therapy

## 2011-04-06 ENCOUNTER — Ambulatory Visit: Payer: PRIVATE HEALTH INSURANCE | Admitting: Occupational Therapy

## 2011-04-11 ENCOUNTER — Ambulatory Visit: Payer: PRIVATE HEALTH INSURANCE | Attending: Internal Medicine | Admitting: Physical Therapy

## 2011-04-11 ENCOUNTER — Encounter: Payer: PRIVATE HEALTH INSURANCE | Admitting: Occupational Therapy

## 2011-04-11 DIAGNOSIS — R269 Unspecified abnormalities of gait and mobility: Secondary | ICD-10-CM | POA: Insufficient documentation

## 2011-04-11 DIAGNOSIS — M25519 Pain in unspecified shoulder: Secondary | ICD-10-CM | POA: Insufficient documentation

## 2011-04-11 DIAGNOSIS — M6281 Muscle weakness (generalized): Secondary | ICD-10-CM | POA: Insufficient documentation

## 2011-04-11 DIAGNOSIS — Z5189 Encounter for other specified aftercare: Secondary | ICD-10-CM | POA: Insufficient documentation

## 2011-04-11 DIAGNOSIS — M25619 Stiffness of unspecified shoulder, not elsewhere classified: Secondary | ICD-10-CM | POA: Insufficient documentation

## 2011-04-14 ENCOUNTER — Encounter: Payer: PRIVATE HEALTH INSURANCE | Admitting: Occupational Therapy

## 2011-04-14 ENCOUNTER — Ambulatory Visit: Payer: PRIVATE HEALTH INSURANCE | Admitting: Physical Therapy

## 2011-04-18 ENCOUNTER — Ambulatory Visit: Payer: PRIVATE HEALTH INSURANCE | Admitting: Physical Therapy

## 2011-04-18 ENCOUNTER — Encounter: Payer: PRIVATE HEALTH INSURANCE | Admitting: Occupational Therapy

## 2011-05-03 ENCOUNTER — Ambulatory Visit (INDEPENDENT_AMBULATORY_CARE_PROVIDER_SITE_OTHER): Payer: PRIVATE HEALTH INSURANCE | Admitting: Internal Medicine

## 2011-05-03 ENCOUNTER — Encounter: Payer: Self-pay | Admitting: Internal Medicine

## 2011-05-03 ENCOUNTER — Ambulatory Visit (HOSPITAL_COMMUNITY)
Admission: RE | Admit: 2011-05-03 | Discharge: 2011-05-03 | Disposition: A | Discharge status: Home / Self Care | Payer: PRIVATE HEALTH INSURANCE | Source: Ambulatory Visit | Attending: Internal Medicine | Admitting: Internal Medicine

## 2011-05-03 VITALS — BP 130/66 | HR 56 | Temp 97.2°F | Ht 65.0 in | Wt 212.0 lb

## 2011-05-03 DIAGNOSIS — L299 Pruritus, unspecified: Secondary | ICD-10-CM

## 2011-05-03 DIAGNOSIS — R609 Edema, unspecified: Secondary | ICD-10-CM

## 2011-05-03 DIAGNOSIS — M79609 Pain in unspecified limb: Secondary | ICD-10-CM

## 2011-05-03 DIAGNOSIS — M7989 Other specified soft tissue disorders: Secondary | ICD-10-CM | POA: Insufficient documentation

## 2011-05-03 MED ORDER — DIPHENHYDRAMINE-ZINC ACETATE 1-0.1 % EX CREA: TOPICAL_CREAM | Freq: Three times a day (TID) | CUTANEOUS | Status: DC | PRN

## 2011-05-03 MED ORDER — CETIRIZINE HCL 10 MG PO CHEW: 10.0000 mg | CHEWABLE_TABLET | Freq: Every day | ORAL | Status: DC

## 2011-05-03 MED ORDER — HYDROCODONE-ACETAMINOPHEN 5-500 MG PO TABS: 1.0000 | ORAL_TABLET | Freq: Every evening | ORAL | Status: DC | PRN

## 2011-05-03 NOTE — Progress Notes (Signed)
History of present illness: [name] is a 78 year old woman with past medical history of pulmonary embolism secondary to knee replacement and was on warfarin for 2 years, hyperlipidemia, low back pain, hypertension, GERD, osteoarthritis presents today for a 3 weeks duration of lower extremity swelling left greater than right. She does have increase in warmth on the left legs. She does have some shortness of breath when she walks a lot, i.e. greater than 1/2 mile. Patient is very active and is doing a lot of exercises at the [facility]. She reports sleeping on 2 pillows at night which has been normal for her. Otherwise, She denies any dyspnea on exertion, chest pain, sudden increase of shortness of breath. She does have some back pain as well as knee pain and would like a refill on her Vicodin. She takes 1-2 tablet occasionally for her pain. Of note, patient is on Norvasc. She has never had a 2-D echocardiogram before. She also reports itching on her lateral side of the chest wall especially after she takes hot showers and has been using alcohol swabs to decrease her pruritis.  Review of system: As per history of present illness  Physical examination: General: alert, well-developed, and cooperative to examination.  Neck: supple, full ROM, no thyromegaly, JVD +4  Lungs: normal respiratory effort, no accessory muscle use, normal breath sounds, no crackles, and no wheezes. Heart: normal rate, regular rhythm, no murmur, no gallop, and no rub.  Abdomen: soft, non-tender, normal bowel sounds, no distention, no guarding, no rebound tenderness Msk: no joint swelling, no joint warmth, and no redness over joints.  Pulses: 2+ DP/PT pulses bilaterally Extremities: No cyanosis, clubbing, +2 pitting edema LLE>RLE, increased in warmth on left Neurologic: alert & oriented X3, cranial nerves II-XII intact  Skin: excoriation marks on her lateral chest wall, no erythema or lesions noted iented X3, memory intact for recent  and remote, normally interactive, good eye contact, not anxious appearing, and not depressed appearing.

## 2011-05-03 NOTE — Patient Instructions (Signed)
You can take Zyrtec 10mg  one tablet daily for itching Try benadryl cream for itiching Stop norvasc (amlodipine) Elevated your legs above your heart level about 45 minutes several times per day Will set up for ultrasound of your legs to rule out blood clots Will set up Echocardiogram (ultrasound of heart) Follow up in 2 weeks

## 2011-05-03 NOTE — Progress Notes (Signed)
*  PRELIMINARY RESULTS* Vascular Ultrasound Lower extremity venous duplex has been completed.  Preliminary findings: Bilaterally no evidence of DVT or baker's cyst.  Farrel Demark , RDMS 05/03/2011, 5:38 PM

## 2011-05-03 NOTE — Assessment & Plan Note (Signed)
Likely from hot showers vs contact dermatitis. She denies any recent change in detergent or soap. -Will try antihistamine- Zyrtec or Claritin -Benadryl gel, moisturizer -Avoid hot showers and skin irritants -If continue to have persistent, will need further work-up including CBC, CMP, TSH, HIV to rule out cholestasis, thyroid disease, meds, renal disease, malignancy, DM, and connective tissue.

## 2011-05-03 NOTE — Assessment & Plan Note (Addendum)
Differential diagnoses include side effect of Norvasc, venous insufficiency, DVTs, vs. heart failure. Unlikely cellulitis as she does not have any tenderness or erythema or any other lesion on her lower extremity. -Will hold Norvasc for now -Will get stat lower extremity Doppler to rule out DVT: Prelim results was negative for DVT -Will get 2-D echocardiogram to evaluate for her EF and other structural heart disease. No history of heart failure documented.  Stress echo in 2009 was normal. -Advised patient to elevate her legs above her heart -Will have patient followup in 2 weeks to reevaluate  Case discussed with attending, Dr. Aundria Rud

## 2011-05-17 ENCOUNTER — Encounter: Payer: PRIVATE HEALTH INSURANCE | Admitting: Internal Medicine

## 2011-05-19 ENCOUNTER — Other Ambulatory Visit: Payer: Self-pay | Admitting: Internal Medicine

## 2011-05-19 DIAGNOSIS — Z1231 Encounter for screening mammogram for malignant neoplasm of breast: Secondary | ICD-10-CM

## 2011-06-13 ENCOUNTER — Telehealth: Payer: Self-pay | Admitting: *Deleted

## 2011-06-13 DIAGNOSIS — G4700 Insomnia, unspecified: Secondary | ICD-10-CM

## 2011-06-13 MED ORDER — TRAZODONE HCL 50 MG PO TABS: 50.0000 mg | ORAL_TABLET | Freq: Every evening | ORAL | Status: DC | PRN

## 2011-06-13 NOTE — Telephone Encounter (Signed)
Needs refill on Trazodone 50mg  tablet - orig fill 03/23/11 and last refill 05/13/11 # 30. [facility].

## 2011-06-14 ENCOUNTER — Ambulatory Visit (HOSPITAL_COMMUNITY)
Admission: RE | Admit: 2011-06-14 | Discharge: 2011-06-14 | Disposition: A | Discharge status: Home / Self Care | Payer: PRIVATE HEALTH INSURANCE | Source: Ambulatory Visit | Attending: Internal Medicine | Admitting: Internal Medicine

## 2011-06-14 DIAGNOSIS — Z1231 Encounter for screening mammogram for malignant neoplasm of breast: Secondary | ICD-10-CM

## 2011-07-12 ENCOUNTER — Ambulatory Visit (HOSPITAL_COMMUNITY)
Admission: RE | Admit: 2011-07-12 | Discharge: 2011-07-12 | Disposition: A | Discharge status: Home / Self Care | Payer: PRIVATE HEALTH INSURANCE | Source: Ambulatory Visit | Attending: Internal Medicine | Admitting: Internal Medicine

## 2011-07-12 ENCOUNTER — Ambulatory Visit (INDEPENDENT_AMBULATORY_CARE_PROVIDER_SITE_OTHER): Payer: PRIVATE HEALTH INSURANCE | Admitting: Internal Medicine

## 2011-07-12 VITALS — BP 134/71 | HR 56 | Temp 97.1°F | Wt 210.3 lb

## 2011-07-12 DIAGNOSIS — R079 Chest pain, unspecified: Secondary | ICD-10-CM

## 2011-07-12 DIAGNOSIS — I498 Other specified cardiac arrhythmias: Secondary | ICD-10-CM | POA: Insufficient documentation

## 2011-07-12 DIAGNOSIS — I10 Essential (primary) hypertension: Secondary | ICD-10-CM

## 2011-07-12 DIAGNOSIS — R0789 Other chest pain: Secondary | ICD-10-CM

## 2011-07-12 DIAGNOSIS — I4949 Other premature depolarization: Secondary | ICD-10-CM | POA: Insufficient documentation

## 2011-07-12 DIAGNOSIS — L299 Pruritus, unspecified: Secondary | ICD-10-CM

## 2011-07-12 DIAGNOSIS — I499 Cardiac arrhythmia, unspecified: Secondary | ICD-10-CM

## 2011-07-12 LAB — CBC WITH DIFFERENTIAL/PLATELET
Basophils Absolute: 0.1 10*3/uL (ref 0.0–0.1)
Basophils Relative: 1 % (ref 0–1)
Eosinophils Absolute: 0.1 10*3/uL (ref 0.0–0.7)
Eosinophils Relative: 3 % (ref 0–5)
HCT: 38.7 % (ref 36.0–46.0)
Hemoglobin: 13.1 g/dL (ref 12.0–15.0)
MCH: 33.2 pg (ref 26.0–34.0)
MCHC: 33.9 g/dL (ref 30.0–36.0)
MCV: 98.2 fL (ref 78.0–100.0)
Monocytes Absolute: 0.5 10*3/uL (ref 0.1–1.0)
Monocytes Relative: 9 % (ref 3–12)
Neutro Abs: 2.6 10*3/uL (ref 1.7–7.7)
RDW: 13.7 % (ref 11.5–15.5)

## 2011-07-12 LAB — COMPLETE METABOLIC PANEL WITH GFR
ALT: 15 U/L (ref 0–35)
Albumin: 4 g/dL (ref 3.5–5.2)
Alkaline Phosphatase: 77 U/L (ref 39–117)
Glucose, Bld: 74 mg/dL (ref 70–99)
Potassium: 4.2 mEq/L (ref 3.5–5.3)
Sodium: 139 mEq/L (ref 135–145)
Total Bilirubin: 0.2 mg/dL — ABNORMAL LOW (ref 0.3–1.2)
Total Protein: 7.4 g/dL (ref 6.0–8.3)

## 2011-07-12 LAB — HIV ANTIBODY (ROUTINE TESTING W REFLEX): HIV: NONREACTIVE

## 2011-07-12 MED ORDER — NITROGLYCERIN 0.4 MG SL SUBL: 0.4000 mg | SUBLINGUAL_TABLET | SUBLINGUAL | Status: DC | PRN

## 2011-07-12 NOTE — Patient Instructions (Addendum)
Will get lab work and I will call you back with any abnormal lab result We will refer you to a cardiologist for stress echocardiogram Please take nitroglycerin 0.4 mg one tablet every 5 minutes as needed for chest pain with a maximum of 3 doses. If you continue to have chest pain, please go to the emergency room for further evaluation Followup in 4 weeks if symptoms are not improved

## 2011-07-12 NOTE — Assessment & Plan Note (Signed)
Well-controlled. Will continue current regimen of lisinopril 40 mg and hydrochlorothiazide 25 mg daily

## 2011-07-12 NOTE — Assessment & Plan Note (Addendum)
Chest pain on exertion concerning for unstable angina given her risk factor of HTN and age. Other differential dx include GERD because Nexium does help alleviate her pain. -EKG today showed sinus bradycardia without any ST or T wave abnormalities -Will get 1 set of cardiac enzymes stat: tro negative. CK 200 -Will refer patient to cardiology for stress echocardiogram -Will prescribe nitroglycerin sublingual when necessary chest pain -Patient was advised to go to the ED if she continues to have chest pain

## 2011-07-12 NOTE — Progress Notes (Signed)
History of present illness: Jennifer Owen is a 78 year old woman with past medical history of osteoarthritis, GERD, pulmonary embolism, hypertension presents today for chest pain, and pruritis.  Chest pain: chest pain x 2 months after walking /exertion, in duration, dull pain 2-3/10, no radiation, every once in a while she feels cool/clamy. Last episode was yesterday (1-2 x per day).  She takes Nexium/TUMs which help alleviate the pain.  Every once in a while she feels heart palpitations.  Denies any stressors in her life at this time.   She does take Aspirin 81mg  daily.  Pruritis: still itches on shoulder and under axilla area.  Zyrtec helps.    OA: has been doing aerobic exercise which really helps.    ROS: As per history of present illness  Physical examination:  General: alert, well-developed, and cooperative to examination.  Neck: supple, full ROM, no thyromegaly, JVD +4  Lungs: normal respiratory effort, no accessory muscle use, normal breath sounds, no crackles, and no wheezes. Heart: normal rate, regular rhythm, no murmur, no gallop, and no rub. Occasional PVCs Abdomen: soft, non-tender, normal bowel sounds, no distention, no guarding, no rebound tenderness  Msk: no joint swelling, no joint warmth, and no redness over joints.  Pulses: 2+ DP/PT pulses bilaterally Extremities: No cyanosis, clubbing +1-2 pitting edema on LLE>RLLE, no increased in warmth or erythema, improved since last office visit. Neurologic: alert & oriented X3, cranial nerves II-XII intact  Skin: excoriation marks on her lateral chest wall, no erythema or lesions noted  iented X3, memory intact for recent and remote, normally interactive, good eye contact, not anxious appearing, and not depressed appearing.

## 2011-07-12 NOTE — Assessment & Plan Note (Addendum)
Still persistent pruritis, unclear etiology. -will check CBC, CMP, TSH, HIV to rule out cholestasis, thyroid disease, meds, renal disease, malignancy, DM, and connective tissue.  -CMP and CBC WNL -HIV and TSH pending -ESR:wnl which makes connective tissue disease less likely

## 2011-08-12 ENCOUNTER — Encounter: Payer: Self-pay | Admitting: Internal Medicine

## 2011-08-12 ENCOUNTER — Ambulatory Visit (INDEPENDENT_AMBULATORY_CARE_PROVIDER_SITE_OTHER): Payer: PRIVATE HEALTH INSURANCE | Admitting: Internal Medicine

## 2011-08-12 VITALS — BP 140/80 | HR 62 | Ht 65.0 in | Wt 207.0 lb

## 2011-08-12 DIAGNOSIS — R079 Chest pain, unspecified: Secondary | ICD-10-CM

## 2011-08-12 NOTE — Progress Notes (Signed)
HPI Patient is a 78 year old who was referred for evaluation of CP The patient was seen in medicine clinic on 7/2.  Complained of CP with exertion.  Dull.  No rdiation.  Did take tums with some relief.  Also noted occasional palpitations. SInce seen in clinic has had some chest tigntness with exertion but not all time.  None at rest.  No PND.  Does note occasional palpitations, short lived  No dizziness.  Allergies  Allergen Reactions  . Tramadol Hcl     REACTION: intolerance:nausea, stomach irritation    Current Outpatient Prescriptions  Medication Sig Dispense Refill  . aspirin 81 MG chewable tablet Chew 81 mg by mouth daily.        . cetirizine (ZYRTEC) 10 MG chewable tablet Chew 1 tablet (10 mg total) by mouth daily.  30 tablet  3  . diphenhydrAMINE-zinc acetate (BENADRYL) cream Apply topically 3 (three) times daily as needed for itching.  28.3 g  0  . esomeprazole (NEXIUM) 40 MG capsule Take 1 capsule (40 mg total) by mouth daily.  30 capsule  11  . hydrochlorothiazide 25 MG tablet Take 1 tablet (25 mg total) by mouth daily.  30 tablet  11  . HYDROcodone-acetaminophen (VICODIN) 5-500 MG per tablet Take 1 tablet by mouth at bedtime as needed for pain.  30 tablet  3  . lisinopril (PRINIVIL,ZESTRIL) 40 MG tablet Take 1 tablet (40 mg total) by mouth daily.  30 tablet  11  . Multiple Vitamins-Minerals (CENTRUM SILVER PO) Take 1 tablet by mouth daily.        . nitroGLYCERIN (NITROSTAT) 0.4 MG SL tablet Place 1 tablet (0.4 mg total) under the tongue every 5 (five) minutes as needed for chest pain.  30 tablet  3  . traZODone (DESYREL) 50 MG tablet Take 1 tablet (50 mg total) by mouth at bedtime as needed for sleep.  30 tablet  2  . DISCONTD: simvastatin (ZOCOR) 20 MG tablet Take 20 mg by mouth every evening.      Marland Kitchen DISCONTD: traZODone (DESYREL) 50 MG tablet Take 1 tablet (50 mg total) by mouth at bedtime as needed for sleep.  30 tablet  0    Past Medical History  Diagnosis Date  . GERD  (gastroesophageal reflux disease)     Papulous gastropathy EGD Oct. 2007  . Hypertension   . Low back pain     intermittent radiculopathy  . Osteoarthritis   . Pulmonary embolism     2/2 knee replacement  . Osteopenia     T score-1.1 R Fremur, - 0.4 L spine  . Hyperlipemia   . Polio 1942  . Urinary incontinence     Past Surgical History  Procedure Date  . Joint replacement     total knee - left 1995, right 2004  . Abdominal hysterectomy 1985  . Oophorectomy 1985    Family History  Problem Relation Age of Onset  . Cancer Daughter     colon caner, <60    History   Social History  . Marital Status: Legally Separated    Spouse Name: N/A    Number of Children: 7  . Years of Education: N/A   Occupational History  . 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 Precious Haws    Social History Main Topics  . Smoking status: Former Games developer  . Smokeless tobacco: Not on file  . Alcohol Use: No  . Drug Use: No  . Sexually Active: Not  on file   Other Topics Concern  . Not on file   Social History Narrative   [facility]    Review of Systems:  All systems reviewed.  They are negative to the above problem except as previously stated.  Vital Signs: BP 140/80  Pulse 62  Ht 5\' 5"  (1.651 m)  Wt 207 lb (93.895 kg)  BMI 34.45 kg/m2  Physical Exam Patient is in NAD HEENT:  Normocephalic, atraumatic. EOMI, PERRLA.  Neck: JVP is normal. No thyromegaly. No bruits.  Lungs: clear to auscultation. No rales no wheezes.  Heart: Regular rate and rhythm. Normal S1, S2. No S3.   No significant murmurs. PMI not displaced.  Abdomen:  Supple, nontender. Normal bowel sounds. No masses. No hepatomegaly.  Extremities:   Good distal pulses throughout. No lower extremity edema.  Musculoskeletal :moving all extremities.  Neuro:   alert and oriented x3.  CN II-XII grossly intact.  EKG:  SR with occasional PVC (07/12/11) Assessment and Plan:  1.  Chest pressure.  Concerning for  angina.  Not 100%.  Would recomm nuclear stress test to evaluate  2.  HTN  Continue meds.    3.  Palpitations.  DO not sound hemodynamically destabilizing.  EKG with one PVC  Follow   Avoid stimulants.  Myoview  4.  HCM  Will need to f/u on lipids.

## 2011-08-12 NOTE — Patient Instructions (Addendum)
Your physician has requested that you have a lexiscan myoview. For further information please visit [email or web address]. Please follow instruction sheet, as given.   

## 2011-08-17 ENCOUNTER — Ambulatory Visit (HOSPITAL_COMMUNITY): Payer: PRIVATE HEALTH INSURANCE | Attending: Cardiovascular Disease | Admitting: Radiology

## 2011-08-17 VITALS — BP 133/64 | HR 48 | Ht 65.0 in | Wt 206.0 lb

## 2011-08-17 DIAGNOSIS — E785 Hyperlipidemia, unspecified: Secondary | ICD-10-CM | POA: Insufficient documentation

## 2011-08-17 DIAGNOSIS — R0602 Shortness of breath: Secondary | ICD-10-CM

## 2011-08-17 DIAGNOSIS — R0609 Other forms of dyspnea: Secondary | ICD-10-CM | POA: Insufficient documentation

## 2011-08-17 DIAGNOSIS — R079 Chest pain, unspecified: Secondary | ICD-10-CM

## 2011-08-17 DIAGNOSIS — R0789 Other chest pain: Secondary | ICD-10-CM | POA: Insufficient documentation

## 2011-08-17 DIAGNOSIS — R110 Nausea: Secondary | ICD-10-CM | POA: Insufficient documentation

## 2011-08-17 DIAGNOSIS — R5381 Other malaise: Secondary | ICD-10-CM | POA: Insufficient documentation

## 2011-08-17 DIAGNOSIS — Z87891 Personal history of nicotine dependence: Secondary | ICD-10-CM | POA: Insufficient documentation

## 2011-08-17 DIAGNOSIS — R002 Palpitations: Secondary | ICD-10-CM | POA: Insufficient documentation

## 2011-08-17 DIAGNOSIS — E669 Obesity, unspecified: Secondary | ICD-10-CM | POA: Insufficient documentation

## 2011-08-17 DIAGNOSIS — I10 Essential (primary) hypertension: Secondary | ICD-10-CM | POA: Insufficient documentation

## 2011-08-17 DIAGNOSIS — R0989 Other specified symptoms and signs involving the circulatory and respiratory systems: Secondary | ICD-10-CM | POA: Insufficient documentation

## 2011-08-17 DIAGNOSIS — R5383 Other fatigue: Secondary | ICD-10-CM | POA: Insufficient documentation

## 2011-08-17 MED ORDER — TECHNETIUM TC 99M TETROFOSMIN IV KIT
10.0000 | PACK | Freq: Once | INTRAVENOUS | Status: AC | PRN
  Administered 2011-08-17: 10 via INTRAVENOUS

## 2011-08-17 MED ORDER — TECHNETIUM TC 99M TETROFOSMIN IV KIT
30.0000 | PACK | Freq: Once | INTRAVENOUS | Status: AC | PRN
  Administered 2011-08-17: 30 via INTRAVENOUS

## 2011-08-17 MED ORDER — REGADENOSON 0.4 MG/5ML IV SOLN
0.4000 mg | Freq: Once | INTRAVENOUS | Status: AC
  Administered 2011-08-17: 0.4 mg via INTRAVENOUS

## 2011-08-17 NOTE — Progress Notes (Signed)
[facility] SITE 3 NUCLEAR MED [address] [place] [address] [phone]  Cardiology Nuclear Med Study  Jennifer Owen is a 78 y.o. female     MRN : [id number]     DOB: Aug 27, 1931  Procedure Date: 08/17/2011  Nuclear Med Background Indication for Stress Test:  Evaluation for Ischemia History:  '09 GMW:NUUVOZ, EF=79%. Cardiac Risk Factors: History of Smoking, Hypertension, Lipids and Obesity.  Symptoms:  Chest Tightness with Exertion (last episode of chest discomfort was Monday, 08/15/11), DOE, Fatigue, Nausea and Palpitations   Nuclear Pre-Procedure Caffeine/Decaff Intake:  None NPO After: 6 pm   Lungs:  clear O2 Sat: 98% on room air. IV 0.9% NS with Angio Cath:  22g  IV Site: R Hand  IV Started by:  Bonnita Levan, RN  Chest Size (in):  42 Cup Size: B  Height: 5\' 5"  (1.651 m)  Weight:  206 lb (93.441 kg)  BMI:  Body mass index is 34.28 kg/(m^2). Tech Comments:  NA    Nuclear Med Study 1 or 2 day study: 1 day  Stress Test Type:  Lexiscan  Reading MD: Charlton Haws, MD  Order Authorizing Provider:  Dietrich Pates, MD  Resting Radionuclide: Technetium 3m Tetrofosmin  Resting Radionuclide Dose: 11.0 mCi   Stress Radionuclide:  Technetium 60m Tetrofosmin  Stress Radionuclide Dose: 33.0 mCi           Stress Protocol Rest HR: 48 Stress HR: 86  Rest BP: 133/64 Stress BP: 140/60  Exercise Time (min): n/a METS: n/a   Predicted Max HR: 142 bpm % Max HR: 60.56 bpm Rate Pressure Product: 36644   Dose of Adenosine (mg):  n/a Dose of Lexiscan: 0.4 mg  Dose of Atropine (mg): n/a Dose of Dobutamine: n/a mcg/kg/min (at max HR)  Stress Test Technologist: Smiley Houseman, CMA-N  Nuclear Technologist:  Doyne Keel, CNMT     Rest Procedure:  Myocardial perfusion imaging was performed at rest 45 minutes following the intravenous administration of Technetium 17m Tetrofosmin.  Rest ECG: Marked sinus bradycardia with occasional PVC's and PAC's.  Stress Procedure:  The  patient received IV Lexiscan 0.4 mg over 15-seconds.  Technetium 32m Tetrofosmin injected at 30-seconds.  There were no significant changes with Lexiscan, occasional PVC/PAC noted.  Quantitative spect images were obtained after a 45 minute delay.  Stress ECG: No significant change from baseline ECG  QPS Raw Data Images:  Normal; no motion artifact; normal heart/lung ratio. Stress Images:  Normal homogeneous uptake in all areas of the myocardium. Rest Images:  Normal homogeneous uptake in all areas of the myocardium. Subtraction (SDS):  Normal Transient Ischemic Dilatation (Normal <1.22):  1.10 Lung/Heart Ratio (Normal <0.45):  0.22  Quantitative Gated Spect Images QGS EDV:  66 ml QGS ESV:  16 ml  Impression Exercise Capacity:  Lexiscan with no exercise. BP Response:  Normal blood pressure response. Clinical Symptoms:  No significant symptoms noted. ECG Impression:  No significant ST segment change suggestive of ischemia. Comparison with Prior Nuclear Study: No images to compare  Overall Impression:  Normal stress nuclear study.  LV Ejection Fraction: 76%.  LV Wall Motion:  NL LV Function; NL Wall Motion   Charlton Haws

## 2011-08-18 ENCOUNTER — Encounter (HOSPITAL_COMMUNITY): Payer: Self-pay | Admitting: Internal Medicine

## 2011-08-24 ENCOUNTER — Other Ambulatory Visit: Payer: Self-pay

## 2011-08-24 DIAGNOSIS — R002 Palpitations: Secondary | ICD-10-CM

## 2011-08-25 ENCOUNTER — Telehealth: Payer: Self-pay | Admitting: *Deleted

## 2011-08-25 NOTE — Telephone Encounter (Signed)
Unable to leave message for patient to schedule 24 hour holter monitor. Home phone not working and I was unable to leave Message on cell phone.

## 2011-08-26 NOTE — Telephone Encounter (Signed)
Patient has holter monitor appointment on 08/31/11 with Elnita Maxwell.

## 2011-08-26 NOTE — Telephone Encounter (Signed)
Fu call °Patient returning your call °

## 2011-08-30 ENCOUNTER — Other Ambulatory Visit: Payer: Self-pay | Admitting: *Deleted

## 2011-08-30 DIAGNOSIS — I10 Essential (primary) hypertension: Secondary | ICD-10-CM

## 2011-08-31 ENCOUNTER — Encounter (INDEPENDENT_AMBULATORY_CARE_PROVIDER_SITE_OTHER): Payer: PRIVATE HEALTH INSURANCE

## 2011-08-31 DIAGNOSIS — R002 Palpitations: Secondary | ICD-10-CM

## 2011-08-31 MED ORDER — LISINOPRIL 40 MG PO TABS: 40.0000 mg | ORAL_TABLET | Freq: Every day | ORAL | Status: DC

## 2011-08-31 MED ORDER — HYDROCHLOROTHIAZIDE 25 MG PO TABS: 25.0000 mg | ORAL_TABLET | Freq: Every day | ORAL | Status: DC

## 2011-08-31 MED ORDER — HYDROCODONE-ACETAMINOPHEN 5-500 MG PO TABS: 1.0000 | ORAL_TABLET | Freq: Every evening | ORAL | Status: DC | PRN

## 2011-08-31 NOTE — Telephone Encounter (Signed)
Ok for refills, please call in Vicodin 5/500mg  po qhs PRN pain #30 with 5RFs

## 2011-09-01 NOTE — Telephone Encounter (Signed)
rx called in

## 2011-09-09 ENCOUNTER — Telehealth: Payer: Self-pay | Admitting: *Deleted

## 2011-09-09 NOTE — Telephone Encounter (Signed)
Called patient with holter monitor results from 8/21. Normal sinus rhythm with occ. PVC's.

## 2011-09-14 ENCOUNTER — Other Ambulatory Visit: Payer: Self-pay | Admitting: *Deleted

## 2011-09-20 MED ORDER — TRAZODONE HCL 50 MG PO TABS: 50.0000 mg | ORAL_TABLET | Freq: Every evening | ORAL | Status: DC | PRN

## 2011-09-20 NOTE — Telephone Encounter (Signed)
Trazodone rx called to [facility].

## 2011-09-22 ENCOUNTER — Encounter: Payer: Self-pay | Admitting: Internal Medicine

## 2011-09-22 ENCOUNTER — Ambulatory Visit (INDEPENDENT_AMBULATORY_CARE_PROVIDER_SITE_OTHER): Payer: PRIVATE HEALTH INSURANCE | Admitting: Internal Medicine

## 2011-09-22 VITALS — BP 169/79 | HR 54 | Temp 97.1°F | Wt 207.6 lb

## 2011-09-22 DIAGNOSIS — L299 Pruritus, unspecified: Secondary | ICD-10-CM

## 2011-09-22 DIAGNOSIS — Z23 Encounter for immunization: Secondary | ICD-10-CM

## 2011-09-22 DIAGNOSIS — R5383 Other fatigue: Secondary | ICD-10-CM | POA: Insufficient documentation

## 2011-09-22 DIAGNOSIS — G8929 Other chronic pain: Secondary | ICD-10-CM

## 2011-09-22 DIAGNOSIS — R5381 Other malaise: Secondary | ICD-10-CM

## 2011-09-22 DIAGNOSIS — M2550 Pain in unspecified joint: Secondary | ICD-10-CM

## 2011-09-22 MED ORDER — NAPROXEN 500 MG PO TABS: 500.0000 mg | ORAL_TABLET | Freq: Two times a day (BID) | ORAL | Status: DC

## 2011-09-22 MED ORDER — PANTOPRAZOLE SODIUM 40 MG PO TBEC: DELAYED_RELEASE_TABLET | ORAL | Status: DC

## 2011-09-22 NOTE — Assessment & Plan Note (Signed)
Likely contact dermatitis from soap.  She reports that her pruritis is much improved after she changed her soap.

## 2011-09-22 NOTE — Assessment & Plan Note (Addendum)
Still have multiple joint pain, vicodin helps at bedtime but she likes to try something for the daytime -Will try Naproxen 500mg  po bid -continue percocet 5mg  at bedtime -continue water aerobics  -She has a history of stomach ulcer so I will also prescribe Protonix while she is taking NSAIDs -will follow up in 3 months but repeat BMP in 4 weeks to make sure her kidney function is ok

## 2011-09-22 NOTE — Patient Instructions (Addendum)
Take Naproxen 500mg  one tablet twice daily with food Take protonix 1 tablet 30 mins before lunch Continue water exercise Follow up in 3 months with Dr. Anselm Jungling

## 2011-09-22 NOTE — Progress Notes (Signed)
HPI: Jennifer Owen is a 78 yo woman with PMH of OA, HTN, HLP, GERD presents today for fatigue and flu vaccine.  She has been feeling cold, tired, no energy since June. She also has arthritis pain: knees, shoulders, and back.  Her itching is much better since she changed her soap.  No other complaints today.  ROS: as per HPI  PE: General: alert, well-developed, and cooperative to examination.   Lungs: normal respiratory effort, no accessory muscle use, normal breath sounds, no crackles, and no wheezes. Heart: normal rate, regular rhythm, no murmur, no gallop, and no rub.  Abdomen: soft, non-tender, normal bowel sounds, no distention, no guarding, no rebound tenderness Msk: no joint swelling, no joint warmth, and no redness over joints.  Pulses: 2+ DP/PT pulses bilaterally Extremities: No cyanosis, clubbing, +2 nonpitting edema. +crepitus on shoulder and knee joints, mild tenderness with passive ROM but no effusion noted Neurologic: nonfocal Skin: turgor normal and no rashes.  Psych: Oriented X3, memory intact for recent and remote, normally interactive, good eye contact, not anxious appearing, and not depressed appearing.

## 2011-09-22 NOTE — Assessment & Plan Note (Signed)
Unclear etiology.  It may be 2/2 to her chronic joint disease.  She is not anemic, TSH is normal in July 2013 -Will treat her chronic disease and monitor

## 2011-09-29 ENCOUNTER — Other Ambulatory Visit: Payer: Self-pay | Admitting: Internal Medicine

## 2011-09-29 MED ORDER — HYDROCODONE-ACETAMINOPHEN 5-325 MG PO TABS: 1.0000 | ORAL_TABLET | Freq: Every evening | ORAL | Status: DC | PRN

## 2011-10-06 ENCOUNTER — Emergency Department (HOSPITAL_COMMUNITY): Payer: PRIVATE HEALTH INSURANCE

## 2011-10-06 ENCOUNTER — Encounter (HOSPITAL_COMMUNITY): Payer: Self-pay | Admitting: Emergency Medicine

## 2011-10-06 ENCOUNTER — Emergency Department (HOSPITAL_COMMUNITY)
Admission: EM | Admit: 2011-10-06 | Discharge: 2011-10-06 | Disposition: A | Discharge status: Home / Self Care | Payer: PRIVATE HEALTH INSURANCE | Attending: Emergency Medicine | Admitting: Emergency Medicine

## 2011-10-06 DIAGNOSIS — Z7982 Long term (current) use of aspirin: Secondary | ICD-10-CM | POA: Insufficient documentation

## 2011-10-06 DIAGNOSIS — G319 Degenerative disease of nervous system, unspecified: Secondary | ICD-10-CM | POA: Insufficient documentation

## 2011-10-06 DIAGNOSIS — R42 Dizziness and giddiness: Secondary | ICD-10-CM | POA: Insufficient documentation

## 2011-10-06 DIAGNOSIS — I10 Essential (primary) hypertension: Secondary | ICD-10-CM | POA: Insufficient documentation

## 2011-10-06 DIAGNOSIS — N390 Urinary tract infection, site not specified: Secondary | ICD-10-CM

## 2011-10-06 DIAGNOSIS — Z96659 Presence of unspecified artificial knee joint: Secondary | ICD-10-CM | POA: Insufficient documentation

## 2011-10-06 DIAGNOSIS — Z79899 Other long term (current) drug therapy: Secondary | ICD-10-CM | POA: Insufficient documentation

## 2011-10-06 HISTORY — DX: Irritable bowel syndrome, unspecified: K58.9

## 2011-10-06 LAB — URINALYSIS, ROUTINE W REFLEX MICROSCOPIC
Bilirubin Urine: NEGATIVE
Nitrite: NEGATIVE
Specific Gravity, Urine: 1.021 (ref 1.005–1.030)
Urobilinogen, UA: 0.2 mg/dL (ref 0.0–1.0)
pH: 5.5 (ref 5.0–8.0)

## 2011-10-06 LAB — CBC WITH DIFFERENTIAL/PLATELET
Eosinophils Absolute: 0.2 10*3/uL (ref 0.0–0.7)
Eosinophils Relative: 5 % (ref 0–5)
HCT: 36 % (ref 36.0–46.0)
Lymphocytes Relative: 37 % (ref 12–46)
Lymphs Abs: 1.6 10*3/uL (ref 0.7–4.0)
MCH: 32.8 pg (ref 26.0–34.0)
MCV: 96.8 fL (ref 78.0–100.0)
Monocytes Absolute: 0.4 10*3/uL (ref 0.1–1.0)
Monocytes Relative: 9 % (ref 3–12)
Platelets: 257 10*3/uL (ref 150–400)
RBC: 3.72 MIL/uL — ABNORMAL LOW (ref 3.87–5.11)
WBC: 4.4 10*3/uL (ref 4.0–10.5)

## 2011-10-06 LAB — COMPREHENSIVE METABOLIC PANEL
ALT: 11 U/L (ref 0–35)
BUN: 20 mg/dL (ref 6–23)
CO2: 26 mEq/L (ref 19–32)
Calcium: 10.4 mg/dL (ref 8.4–10.5)
Creatinine, Ser: 1.09 mg/dL (ref 0.50–1.10)
GFR calc Af Amer: 55 mL/min — ABNORMAL LOW (ref >90)
GFR calc non Af Amer: 47 mL/min — ABNORMAL LOW (ref >90)
Glucose, Bld: 97 mg/dL (ref 70–99)
Total Protein: 6.7 g/dL (ref 6.0–8.3)

## 2011-10-06 LAB — URINE MICROSCOPIC-ADD ON

## 2011-10-06 LAB — POCT I-STAT TROPONIN I

## 2011-10-06 MED ORDER — MECLIZINE HCL 25 MG PO TABS: 25.0000 mg | ORAL_TABLET | Freq: Three times a day (TID) | ORAL | Status: DC | PRN

## 2011-10-06 MED ORDER — MECLIZINE HCL 25 MG PO TABS
50.0000 mg | ORAL_TABLET | Freq: Once | ORAL | Status: AC
  Administered 2011-10-06: 50 mg via ORAL
  Filled 2011-10-06: qty 2

## 2011-10-06 MED ORDER — CEPHALEXIN 500 MG PO CAPS: 500.0000 mg | ORAL_CAPSULE | Freq: Four times a day (QID) | ORAL | Status: DC

## 2011-10-06 NOTE — ED Provider Notes (Signed)
History     CSN: [id number]  Arrival date & time 10/06/11  0609   First MD Initiated Contact with Patient 10/06/11 0617      Chief Complaint  Patient presents with  . Dizziness    (Consider location/radiation/quality/duration/timing/severity/associated sxs/prior treatment) HPI Comments: Jennifer Owen is a 78 y.o. Female who presents with complaint of feeling dizzy. States she got up this morning and as she was going to the bathroom, felt like "I was going to pass out." States she felt like everything around her was spinning. States she called her friend, who brought her here. Pt denies any headache, denies visual changes, denies any chest pain, shortness of breath. Admits to some nausea, no vomiting, and does admit to some epigastric "slight cramping."  Pt states she has never had similar symptoms in the past. States had a stress test and holter test 2 wks ago, which were both normal. Pt states moving around and turning head makes symptoms worse. Pt states she is able to ambulate, per friend who is with pt, she is able to walk but slow.    Past Medical History  Diagnosis Date  . GERD (gastroesophageal reflux disease)     Papulous gastropathy EGD Oct. 2007  . Hypertension   . Low back pain     intermittent radiculopathy  . Osteoarthritis   . Pulmonary embolism     2/2 knee replacement  . Osteopenia     T score-1.1 R [place], - 0.4 L spine  . Hyperlipemia   . Polio 1942  . Urinary incontinence   . IBS (irritable bowel syndrome)     Past Surgical History  Procedure Date  . Joint replacement     total knee - left 1995, right 2004  . Abdominal hysterectomy 1985  . Oophorectomy 1985    Family History  Problem Relation Age of Onset  . Cancer Daughter     colon caner, <60    History  Substance Use Topics  . Smoking status: Former Games developer  . Smokeless tobacco: Not on file  . Alcohol Use: No    OB History    Grav Para Term Preterm Abortions TAB SAB Ect Mult Living                  Review of Systems  Constitutional: Negative for fever, chills and fatigue.  HENT: Negative for neck pain.   Respiratory: Negative.   Cardiovascular: Negative.   Gastrointestinal: Positive for abdominal pain. Negative for nausea, vomiting, diarrhea and constipation.  Genitourinary: Negative for dysuria and flank pain.  Musculoskeletal: Negative.   Skin: Negative.   Neurological: Negative for dizziness, weakness, light-headedness and headaches.    Allergies  Tramadol hcl  Home Medications   Current Outpatient Rx  Name Route Sig Dispense Refill  . ASPIRIN 81 MG PO CHEW Oral Chew 81 mg by mouth daily.      Marland Kitchen CETIRIZINE HCL 10 MG PO CHEW Oral Chew 1 tablet (10 mg total) by mouth daily. 30 tablet 3  . DIPHENHYDRAMINE-ZINC ACETATE 1-0.1 % EX CREA Topical Apply topically 3 (three) times daily as needed for itching. 28.3 g 0  . ESOMEPRAZOLE MAGNESIUM 40 MG PO CPDR Oral Take 1 capsule (40 mg total) by mouth daily. 30 capsule 11  . HYDROCHLOROTHIAZIDE 25 MG PO TABS Oral Take 1 tablet (25 mg total) by mouth daily. 30 tablet 11  . HYDROCODONE-ACETAMINOPHEN 5-325 MG PO TABS Oral Take 1 tablet by mouth at bedtime as needed for pain. 30  tablet 0  . LISINOPRIL 40 MG PO TABS Oral Take 1 tablet (40 mg total) by mouth daily. 30 tablet 11  . CENTRUM SILVER PO Oral Take 1 tablet by mouth daily.      Marland Kitchen NAPROXEN 500 MG PO TABS Oral Take 1 tablet (500 mg total) by mouth 2 (two) times daily with a meal. 60 tablet 6  . NITROGLYCERIN 0.4 MG SL SUBL Sublingual Place 1 tablet (0.4 mg total) under the tongue every 5 (five) minutes as needed for chest pain. 30 tablet 3  . PANTOPRAZOLE SODIUM 40 MG PO TBEC  Take 1 tablet 30 minutes before lunch 31 tablet 6  . TRAZODONE HCL 50 MG PO TABS Oral Take 1 tablet (50 mg total) by mouth at bedtime as needed for sleep. 30 tablet 2    BP 131/71  Pulse 53  Temp 97.5 F (36.4 C) (Oral)  Resp 18  SpO2 99%  Physical Exam  Nursing note and vitals  reviewed. Constitutional: She is oriented to person, place, and time. She appears well-developed and well-nourished. No distress.  HENT:  Head: Normocephalic and atraumatic.  Eyes: Conjunctivae normal and EOM are normal. Pupils are equal, round, and reactive to light.       Horizontal nystagmus present  Neck: Normal range of motion. Neck supple.  Cardiovascular: Normal rate, regular rhythm and normal heart sounds.   Pulmonary/Chest: Effort normal and breath sounds normal. No respiratory distress. She has no wheezes. She has no rales.  Abdominal: Soft. Bowel sounds are normal. She exhibits no distension. There is no tenderness. There is no rebound.  Musculoskeletal: She exhibits no edema.  Neurological: She is alert and oriented to person, place, and time. No cranial nerve deficit. Coordination normal.       5/5 and equal upper and lower extremity strength bilaterally. Visual fields intact. No pronator drift. Normal finger to nose. Normal heel to shin  Skin: Skin is warm and dry.  Psychiatric: She has a normal mood and affect.    ED Course  Procedures (including critical care time)  Pt with oset of dizziness when woke up this morning. Pt reports "room spinning" sensation. No hx of vertigo or CVA. No cp. Exam showing horizontal nystagmus, otherwise unremarkable. Differentials include metabolic cause, vertigo, CVA, arrhythmia. Will get ECG, labs. Meclizine ordered.    Date: 10/06/2011  Rate: 53  Rhythm: sinus bradycardia  QRS Axis: normal  Intervals: normal  ST/T Wave abnormalities: nonspecific ST changes  Conduction Disutrbances:none  Narrative Interpretation:   Old EKG Reviewed: changes noted new bradycardia  Results for orders placed during the hospital encounter of 10/06/11  CBC WITH DIFFERENTIAL      Component Value Range   WBC 4.4  4.0 - 10.5 K/uL   RBC 3.72 (*) 3.87 - 5.11 MIL/uL   Hemoglobin 12.2  12.0 - 15.0 g/dL   HCT 11.9  14.7 - 82.9 %   MCV 96.8  78.0 - 100.0 fL    MCH 32.8  26.0 - 34.0 pg   MCHC 33.9  30.0 - 36.0 g/dL   RDW 56.2  13.0 - 86.5 %   Platelets 257  150 - 400 K/uL   Neutrophils Relative 48  43 - 77 %   Neutro Abs 2.1  1.7 - 7.7 K/uL   Lymphocytes Relative 37  12 - 46 %   Lymphs Abs 1.6  0.7 - 4.0 K/uL   Monocytes Relative 9  3 - 12 %   Monocytes Absolute 0.4  0.1 -  1.0 K/uL   Eosinophils Relative 5  0 - 5 %   Eosinophils Absolute 0.2  0.0 - 0.7 K/uL   Basophils Relative 1  0 - 1 %   Basophils Absolute 0.1  0.0 - 0.1 K/uL  COMPREHENSIVE METABOLIC PANEL      Component Value Range   Sodium 143  135 - 145 mEq/L   Potassium 3.9  3.5 - 5.1 mEq/L   Chloride 108  96 - 112 mEq/L   CO2 26  19 - 32 mEq/L   Glucose, Bld 97  70 - 99 mg/dL   BUN 20  6 - 23 mg/dL   Creatinine, Ser 1.61  0.50 - 1.10 mg/dL   Calcium 09.6  8.4 - 04.5 mg/dL   Total Protein 6.7  6.0 - 8.3 g/dL   Albumin 3.7  3.5 - 5.2 g/dL   AST 17  0 - 37 U/L   ALT 11  0 - 35 U/L   Alkaline Phosphatase 68  39 - 117 U/L   Total Bilirubin 0.4  0.3 - 1.2 mg/dL   GFR calc non Af Amer 47 (*) >90 mL/min   GFR calc Af Amer 55 (*) >90 mL/min  URINALYSIS, ROUTINE W REFLEX MICROSCOPIC      Component Value Range   Color, Urine YELLOW  YELLOW   APPearance CLOUDY (*) CLEAR   Specific Gravity, Urine 1.021  1.005 - 1.030   pH 5.5  5.0 - 8.0   Glucose, UA NEGATIVE  NEGATIVE mg/dL   Hgb urine dipstick NEGATIVE  NEGATIVE   Bilirubin Urine NEGATIVE  NEGATIVE   Ketones, ur NEGATIVE  NEGATIVE mg/dL   Protein, ur NEGATIVE  NEGATIVE mg/dL   Urobilinogen, UA 0.2  0.0 - 1.0 mg/dL   Nitrite NEGATIVE  NEGATIVE   Leukocytes, UA MODERATE (*) NEGATIVE  URINE MICROSCOPIC-ADD ON      Component Value Range   Squamous Epithelial / LPF FEW (*) RARE   WBC, UA 7-10  <3 WBC/hpf   RBC / HPF 0-2  <3 RBC/hpf   Bacteria, UA RARE  RARE   Casts HYALINE CASTS (*) NEGATIVE   Mr Brain Wo Contrast  10/06/2011  *RADIOLOGY REPORT*  Clinical Data: Dizziness with nausea.  History of hypertension.  MRI HEAD WITHOUT  CONTRAST  Technique:  Multiplanar, multiecho pulse sequences of the brain and surrounding structures were obtained according to standard protocol without intravenous contrast.  Comparison: None.  Findings: There is no evidence for acute infarction, intracranial hemorrhage, mass lesion, hydrocephalus, or extra-axial fluid. Moderate atrophy is present.  Mild increased signal in the periventricular and subcortical white matter is consistent with hypertensive related chronic microvascular ischemic change.  There are no foci of chronic hemorrhage. Partial empty sella.  No tonsillar herniation.  Inferomedial to the left cavernous sinus there is a skull base defect with CSF like signal characteristics.  Cross-sectional measurements on image 7 series 5 are 16 x 12 mm.  This likely represents an incidental arachnoid cyst or skull base meningocele. A similar 4 mm diameter cyst is observed on the right. Extracranial soft tissues are otherwise unremarkable. Large retention cyst right maxillary sinus.  No air-fluid levels. Mastoids clear.  IMPRESSION: Atrophy and chronic microvascular ischemic change.  No acute intracranial findings.  Inferiorly projecting left greater than right skull base CSF like cysts, likely incidental arachnoid cyst or meningoceles.   Original Report Authenticated By: Elsie Stain, M.D.     8:24 AM Pt reassessed. Her dizziness improved with meclizine. She  is able to walk with normal gait. MR negative. UA showed 7-10WBCs with moderate leukocytes, cultures sent, will start on keflex.  Filed Vitals:   10/06/11 0615 10/06/11 0645  BP: 131/71   Pulse: 53   Temp: 97.5 F (36.4 C) 97.8 F (36.6 C)  TempSrc: Oral   Resp: 18   SpO2: 99%      1. Vertigo   2. UTI (lower urinary tract infection)       MDM  Pt with  Vertigo like symptoms onset this morning. Dizziness is reproducible on exam with positional changes. She improved with meclizine. Her ECG showed no significant changes, troponin  negative, labs unremarkable. Will treat for possible UTI, culture sent. MR negative for acute infarct. Pt neurovascularly intact, d/c home in stable condition.         Lottie Mussel, [place] 10/06/11 [phone]

## 2011-10-06 NOTE — ED Notes (Signed)
Troponin results  cTnl  0.00 ng/mL 

## 2011-10-06 NOTE — ED Notes (Signed)
PT. REPORTS DIZZINESS ONSET THIS MORNING AND  SLIGHT MID ABDOMINAL CRAMPING .

## 2011-10-06 NOTE — ED Provider Notes (Signed)
78 year old female with no history of stroke or cardiac disease presents with a complaint of dizziness which started this morning while ambulating. She had the acute onset of a feeling of the room spinning, associated nausea but no vomiting, no focal weakness numbness difficulty speaking or changes in her vision. On exam the patient has soft abdomen, clear heart and lung sounds without any murmurs or carotid bruits and strong peripheral pulses at the radial arteries. Neurologically the patient has normal strength and sensation of the bilateral upper and lower strumming these, cranial nerves III through XII are intact, she has reproducible vertigo symptoms with nystatin is when her head is turned to the right. This fatigues about 1.5 minutes after turning the head. She has normal finger-nose-finger, normal heel shin and normal speech and memory and she is alert and oriented x3. The patient appears stable, doubt CVA, MRI to rule out otherwise patient likely has peripheral vertigo, meclizine, antiemetics as needed, anticipate discharge of MRI normal.  Medical screening examination/treatment/procedure(s) were conducted as a shared visit with non-physician practitioner(s) and myself.  I personally evaluated the patient during the encounter    Vida Roller, MD 10/06/11 [phone]

## 2011-10-06 NOTE — ED Notes (Signed)
Patient states "dizziness better with Antivert".

## 2011-10-07 LAB — URINE CULTURE
Colony Count: NO GROWTH
Culture: NO GROWTH

## 2011-10-13 ENCOUNTER — Other Ambulatory Visit: Payer: Self-pay | Admitting: *Deleted

## 2011-10-13 DIAGNOSIS — K589 Irritable bowel syndrome without diarrhea: Secondary | ICD-10-CM

## 2011-10-13 MED ORDER — ESOMEPRAZOLE MAGNESIUM 40 MG PO CPDR: 40.0000 mg | DELAYED_RELEASE_CAPSULE | Freq: Every day | ORAL | Status: DC

## 2011-10-13 NOTE — Telephone Encounter (Signed)
Rx faxed in.

## 2011-10-18 ENCOUNTER — Encounter: Payer: Self-pay | Admitting: Internal Medicine

## 2011-10-18 ENCOUNTER — Ambulatory Visit (INDEPENDENT_AMBULATORY_CARE_PROVIDER_SITE_OTHER): Payer: PRIVATE HEALTH INSURANCE | Admitting: Internal Medicine

## 2011-10-18 VITALS — BP 140/70 | HR 53 | Temp 97.0°F | Wt 212.3 lb

## 2011-10-18 DIAGNOSIS — M159 Polyosteoarthritis, unspecified: Secondary | ICD-10-CM

## 2011-10-18 DIAGNOSIS — I10 Essential (primary) hypertension: Secondary | ICD-10-CM

## 2011-10-18 DIAGNOSIS — M169 Osteoarthritis of hip, unspecified: Secondary | ICD-10-CM

## 2011-10-18 LAB — BASIC METABOLIC PANEL WITH GFR
CO2: 26 mEq/L (ref 19–32)
Calcium: 10.3 mg/dL (ref 8.4–10.5)
Creat: 1.05 mg/dL (ref 0.50–1.10)
GFR, Est African American: 59 mL/min — ABNORMAL LOW
Glucose, Bld: 81 mg/dL (ref 70–99)

## 2011-10-18 NOTE — Assessment & Plan Note (Signed)
Well controlled, will continue current regimen.

## 2011-10-18 NOTE — Assessment & Plan Note (Signed)
Improving, will continue current regimen. Her Cr is slightly up at 1.09 on 9/26 therefore I will check another BMP to make sure that her Cr is stable for her to continue Naproxen.  She does take Vicodin at bedtime which helps which her pain as well.

## 2011-10-18 NOTE — Progress Notes (Signed)
HPI: Ms. Twining is well known to me presents today for hospital follow up.  She was seen in ED on 9/26 for vertigo-spinning sensation with certain head movements and was given meclizine. She reports that the meclizine helped her dizziness and it actually resolved. She did have some dysuria and was treated with Keflex, she almost completed the course.  Her dysuria has resolved.  As for her joint pain: she was prescribed Naproxen during last office visit and she reports improvement. Her fatigue is much better.  No other complaints today.    ROS: as per HPI  PE General: alert, well-developed, and cooperative to examination.  Lungs: normal respiratory effort, no accessory muscle use, normal breath sounds, no crackles, and no wheezes. Heart: normal rate, regular rhythm, no murmur, no gallop, and no rub.  Abdomen: soft, non-tender, normal bowel sounds, no distention, no guarding, no rebound tenderness Pulses: 2+ DP/PT pulses bilaterally Extremities: No cyanosis, clubbing, +2 pitting edema bilaterally Neurologic: alert & oriented X3, cranial nerves II-XII intact, strength normal in all extremities, sensation intact to light touch Skin: turgor normal and no rashes.  Psych: Oriented X3, memory intact for recent and remote, normally interactive, good eye contact, not anxious appearing, and not depressed appearing.

## 2011-11-16 NOTE — Addendum Note (Signed)
Addended by: Bufford Spikes on: 11/16/2011 11:44 AM   Modules accepted: Orders

## 2012-01-10 ENCOUNTER — Other Ambulatory Visit: Payer: Self-pay | Admitting: Internal Medicine

## 2012-02-21 ENCOUNTER — Ambulatory Visit (INDEPENDENT_AMBULATORY_CARE_PROVIDER_SITE_OTHER): Payer: PRIVATE HEALTH INSURANCE | Admitting: Internal Medicine

## 2012-02-21 VITALS — BP 188/93 | HR 58 | Temp 97.1°F | Wt 206.9 lb

## 2012-02-21 DIAGNOSIS — I872 Venous insufficiency (chronic) (peripheral): Secondary | ICD-10-CM

## 2012-02-21 DIAGNOSIS — I10 Essential (primary) hypertension: Secondary | ICD-10-CM

## 2012-02-21 DIAGNOSIS — K219 Gastro-esophageal reflux disease without esophagitis: Secondary | ICD-10-CM

## 2012-02-21 MED ORDER — PANTOPRAZOLE SODIUM 40 MG PO TBEC: 40.0000 mg | DELAYED_RELEASE_TABLET | Freq: Every day | ORAL | Status: DC

## 2012-02-21 MED ORDER — AMITRIPTYLINE HCL 25 MG PO TABS: 25.0000 mg | ORAL_TABLET | Freq: Every day | ORAL | Status: DC

## 2012-02-21 NOTE — Patient Instructions (Addendum)
Stop taking Nexium Start taking Protonix 40mg  one tablet 30 minutes before you eat You can use compression stocking to help with your legs Take your blood pressure pills in the morning Follow up with Dr. Anselm Jungling in 1 month and repeat blood work

## 2012-02-21 NOTE — Progress Notes (Signed)
Patient ID: Jennifer Owen, female   DOB: [birth year], 79 y.o.   MRN: [id number] HPI: [name] is a 79 yo woman with pMH of HTN, OA, GERD presents today for routine follow up. She continues to have left lower leg swelling.  She has not tried compression stocking before.  She denies any SOB or chest pain.  Negative venous doppler in 04/2011 for DVTs. She states that her NExium works sometimes for her GERD which is characterized by sour taste in her mouth and she does take it 30 mins before meal. WOuld like to try something else. Her foot doctor has prescribed amitriptyline which works well for her.  No other complaints today.   ROS: as per HPI  PE: General: alert, well-developed, and cooperative to examination.  Lungs: normal respiratory effort, no accessory muscle use, normal breath sounds, no crackles, and no wheezes. Heart: normal rate, regular rhythm, no murmur, no gallop, and no rub.  Abdomen: soft, non-tender, normal bowel sounds, no distention, no guarding, no rebound tenderness Msk: no joint swelling, no joint warmth, and no redness over joints.  Pulses: 2+ DP/PT pulses bilaterally Extremities: No cyanosis, clubbing, Left lower extremity edema more around the ankle, +1 pitting. No erythema or increased in warmth.  Right LE trace edema Neurologic: nonfocal Skin: turgor normal and no rashes.  Psych: appropriate

## 2012-02-21 NOTE — Assessment & Plan Note (Signed)
Very mild venous insufficiency on left lower extremity. NO increased in warmth or erythema.  LLE is slightly edematous than RLE.  She had neg leg doppler in 04/2011 when she saw me for similar complaints. -Will try compression stocking and leg elevation

## 2012-02-21 NOTE — Assessment & Plan Note (Addendum)
Sometimes Nexium works but sometimes it does not work. Has sour taste in her mouth -Will try Protonix 40mg  30 mins before meal

## 2012-02-21 NOTE — Assessment & Plan Note (Addendum)
BP Readings from Last 3 Encounters:  02/21/12 188/93  10/18/11 140/70  10/06/11 131/71    Lab Results  Component Value Date   NA 140 10/18/2011   K 4.2 10/18/2011   CREATININE 1.05 10/18/2011    Assessment:  Blood pressure control:  NO  Progress toward BP goal:   NO She shows me her BP at home that was taken by [facility]: 140/90, she tells me that she did not take her meds today and she usually takes it at bedtime.  I asked her to take it in morning.  WIll repeat her BP and BMP in 1 month . Repeat BP in clinic was still 185/95  Plan:  Medications:  Continue current meds  Educational resources provided:  Yes  Self management tools provided:  Yes

## 2012-03-21 ENCOUNTER — Other Ambulatory Visit: Payer: Self-pay | Admitting: *Deleted

## 2012-03-21 MED ORDER — HYDROCODONE-ACETAMINOPHEN 5-325 MG PO TABS: 1.0000 | ORAL_TABLET | Freq: Every evening | ORAL | Status: DC | PRN

## 2012-03-22 NOTE — Telephone Encounter (Signed)
Called to pharm 

## 2012-03-27 ENCOUNTER — Ambulatory Visit (INDEPENDENT_AMBULATORY_CARE_PROVIDER_SITE_OTHER): Payer: PRIVATE HEALTH INSURANCE | Admitting: Internal Medicine

## 2012-03-27 ENCOUNTER — Encounter: Payer: Self-pay | Admitting: Internal Medicine

## 2012-03-27 VITALS — BP 136/69 | HR 60 | Temp 97.0°F | Wt 207.1 lb

## 2012-03-27 DIAGNOSIS — I872 Venous insufficiency (chronic) (peripheral): Secondary | ICD-10-CM

## 2012-03-27 DIAGNOSIS — M159 Polyosteoarthritis, unspecified: Secondary | ICD-10-CM

## 2012-03-27 DIAGNOSIS — K219 Gastro-esophageal reflux disease without esophagitis: Secondary | ICD-10-CM

## 2012-03-27 MED ORDER — HYDROCODONE-ACETAMINOPHEN 5-325 MG PO TABS: 1.0000 | ORAL_TABLET | Freq: Every evening | ORAL | Status: DC | PRN

## 2012-03-27 NOTE — Assessment & Plan Note (Signed)
Her bilateral lower extremity edema is much better today. She has not get her compression stocking yet but will get it this weekend. I encourage legs elevation as well as compression stocking. No sign of infection or erythema noted.

## 2012-03-27 NOTE — Assessment & Plan Note (Signed)
Well-controlled. Continue Protonix 40 mg 30 minutes before meal.

## 2012-03-27 NOTE — Patient Instructions (Signed)
Stop trazodone Continue current medications Continue legs elevation and use compression Followup in 3 months

## 2012-03-27 NOTE — Assessment & Plan Note (Signed)
She still has pain in her joints but is adequately controlled with hydrocodone at bedtime. -Gave refill for hydrocodone 5/325 one tablet at bedtime when necessary. Refill #5. Pharmacy not to fill until 04/21/2012 -Continue water exercise at [facility]

## 2012-03-27 NOTE — Assessment & Plan Note (Signed)
BP Readings from Last 3 Encounters:  03/27/12 136/69  02/21/12 188/93  10/18/11 140/70    Lab Results  Component Value Date   NA 140 10/18/2011   K 4.2 10/18/2011   CREATININE 1.05 10/18/2011    Assessment:  Blood pressure control:   yes  Progress toward BP goal:   yes   Plan:  Medications: Continue Lisinopril 40 mg daily, hydrochlorothiazide 25 daily.  Educational resources provided: brochure;video  Self management tools provided: home blood pressure logbook

## 2012-03-27 NOTE — Progress Notes (Signed)
Patient ID: Jennifer Owen, female   DOB: [birth year], 79 y.o.   MRN: [id number] History of present illness: Jennifer Owen is a 79 year old woman with past medical history of hypertension, GERD presents today for followup. She has occasional pain in her legs do to her arthritis but it is controlled with hydrocodone at night as well as Elavil. She continues to go to the [facility] for water exercise.  She is doing great otherwise. No complaints. She has been taking her blood pressure medication in the morning and that has significantly improve her blood pressure after I saw her last time. She stopped Nexium and started Protonix which works well for her GERD symptoms. She states that she has not really been using trazodone at bedtime because the Elavil helps her sleep at night. Review of system: As per history of present illness  Physical examination: General: alert, well-developed, and cooperative to examination.  Lungs: normal respiratory effort, no accessory muscle use, normal breath sounds, no crackles, and no wheezes. Heart: normal rate, regular rhythm, no murmur, no gallop, and no rub.  Abdomen: soft, non-tender, normal bowel sounds, no distention, no guarding, no rebound tenderness Neurologic: nonfocal Skin: turgor normal and no rashes.  Extremity: Bilateral lower extremity nonpitting edema +1. No erythema  Noted. Psych: appropriate

## 2012-04-27 ENCOUNTER — Other Ambulatory Visit: Payer: Self-pay | Admitting: Internal Medicine

## 2012-05-04 ENCOUNTER — Ambulatory Visit (INDEPENDENT_AMBULATORY_CARE_PROVIDER_SITE_OTHER): Payer: PRIVATE HEALTH INSURANCE | Admitting: Radiation Oncology

## 2012-05-04 VITALS — BP 151/70 | HR 58 | Temp 97.2°F | Ht 65.0 in | Wt 207.5 lb

## 2012-05-04 DIAGNOSIS — K219 Gastro-esophageal reflux disease without esophagitis: Secondary | ICD-10-CM

## 2012-05-04 DIAGNOSIS — I872 Venous insufficiency (chronic) (peripheral): Secondary | ICD-10-CM

## 2012-05-04 DIAGNOSIS — I10 Essential (primary) hypertension: Secondary | ICD-10-CM

## 2012-05-04 MED ORDER — PANTOPRAZOLE SODIUM 40 MG PO TBEC: 40.0000 mg | DELAYED_RELEASE_TABLET | Freq: Two times a day (BID) | ORAL | Status: DC

## 2012-05-04 NOTE — Assessment & Plan Note (Signed)
BP Readings from Last 3 Encounters:  05/04/12 151/70  03/27/12 136/69  02/21/12 188/93    Lab Results  Component Value Date   NA 140 10/18/2011   K 4.2 10/18/2011   CREATININE 1.05 10/18/2011    Assessment: Blood pressure control: controlled Progress toward BP goal:  at goal Comments: BP slightly higher than previous visit--pt believes this is secondary to exertion in her garden today. Regardless, BP approximately at goal for age (<150/90).   Plan: Medications:  continue current medications Educational resources provided: brochure Self management tools provided: home blood pressure logbook Other plans:

## 2012-05-04 NOTE — Progress Notes (Signed)
Subjective:    Patient ID: Jennifer Owen, female    DOB: 02-Jul-1931, 79 y.o.   MRN: [id number]  HPI Patient presents to clinic today with complaints of heartburn and a sour taste in her mouth that have not improved with Protonix 40 mg twice a day. Patient admits to consumption of coffee every morning prior to taking protonix as well. She denies any recent history of smoking or alcohol consumption. She occasionally eats spicy foods but has no obvious worsening of her symptoms with these foods. She states she tries to avoid foods that are high in fat content. She admits to lying flat while taking multiple naps during the day.   She denies any recent abdominal pain, nausea, vomiting, or diarrhea. Denies constipation, although she takes OTC senna for prevention of this issue. Denies any bloody or black tarry stools. Denies any recent weight loss or change in appetite. She states that her last colonoscopy was in 2011 and was unrevealing of any polyps. She has no other complaints today and is otherwise feeling well.  Review of Systems  All other systems reviewed and are negative.   Current Outpatient Medications: Current Outpatient Prescriptions  Medication Sig Dispense Refill  . amitriptyline (ELAVIL) 25 MG tablet Take 1 tablet (25 mg total) by mouth at bedtime.  30 tablet  0  . aspirin 81 MG chewable tablet Chew 81 mg by mouth daily.        . hydrochlorothiazide (HYDRODIURIL) 25 MG tablet Take 1 tablet (25 mg total) by mouth daily.  30 tablet  11  . HYDROcodone-acetaminophen (NORCO/VICODIN) 5-325 MG per tablet Take 1 tablet by mouth at bedtime as needed for pain.  30 tablet  5  . lisinopril (PRINIVIL,ZESTRIL) 40 MG tablet Take 1 tablet (40 mg total) by mouth daily.  30 tablet  11  . Multiple Vitamin (MULTIVITAMIN WITH MINERALS) TABS Take 1 tablet by mouth daily.      . nitroGLYCERIN (NITROSTAT) 0.4 MG SL tablet Place 1 tablet (0.4 mg total) under the tongue every 5 (five) minutes as needed for  chest pain.  30 tablet  3  . pantoprazole (PROTONIX) 40 MG tablet Take 1 tablet (40 mg total) by mouth 2 (two) times daily before a meal.  60 tablet  5  . traZODone (DESYREL) 50 MG tablet TAKE 1 TABLET BY MOUTH AT BEDTIME AS NEEDED FOR SLEEP.  30 tablet  2  . [DISCONTINUED] simvastatin (ZOCOR) 20 MG tablet Take 20 mg by mouth every evening.       No current facility-administered medications for this visit.    Allergies: Allergies  Allergen Reactions  . Tramadol Hcl     REACTION: intolerance:nausea, stomach irritation     Past Medical History: Past Medical History  Diagnosis Date  . GERD (gastroesophageal reflux disease)     Papulous gastropathy EGD Oct. 2007  . Hypertension   . Low back pain     intermittent radiculopathy  . Osteoarthritis   . Pulmonary embolism     2/2 knee replacement  . Osteopenia     T score-1.1 R Fremur, - 0.4 L spine  . Hyperlipemia   . Polio 1942  . Urinary incontinence   . IBS (irritable bowel syndrome)     Past Surgical History: Past Surgical History  Procedure Laterality Date  . Joint replacement      total knee - left 1995, right 2004  . Abdominal hysterectomy  1985  . Oophorectomy  1985    Family History: Family  History  Problem Relation Age of Onset  . Cancer Daughter     colon caner, <60    Social History: History   Social History  . Marital Status: Legally Separated    Spouse Name: N/A    Number of Children: 7  . Years of Education: N/A   Occupational History  . 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 [employer]    Social History Main Topics  . Smoking status: Former Games developer  . Smokeless tobacco: Not on file  . Alcohol Use: No  . Drug Use: No  . Sexually Active: Not on file   Other Topics Concern  . Not on file   Social History Narrative   [facility]     Vital Signs: Blood pressure 151/70, pulse 58, temperature 97.2 F (36.2 C), temperature source Oral, height 5\' 5"  (1.651 m), weight 207 lb  8 oz (94.121 kg), SpO2 99.00%.      Objective:   Physical Exam  Constitutional: She is oriented to person, place, and time. She appears well-developed and well-nourished. No distress.  HENT:  Head: Normocephalic and atraumatic.  Eyes: Conjunctivae are normal. Pupils are equal, round, and reactive to light. No scleral icterus.  Neck: Normal range of motion. Neck supple. No tracheal deviation present.  Cardiovascular: Normal rate and regular rhythm.   No murmur heard. Pulmonary/Chest: Effort normal. She has no wheezes. She has no rales.  Abdominal: Soft. Bowel sounds are normal. She exhibits no distension. There is no tenderness.  Musculoskeletal: Normal range of motion. She exhibits no edema (L calf slightly larger in diameter than R calf. No pitting edema in BLE. ).  Neurological: She is alert and oriented to person, place, and time. No cranial nerve deficit.  Skin: Skin is warm and dry. No erythema.  Psychiatric: She has a normal mood and affect. Her behavior is normal.    Assessment & Plan:

## 2012-05-04 NOTE — Patient Instructions (Addendum)

## 2012-05-04 NOTE — Assessment & Plan Note (Signed)
Lower extremities are no more swollen than at baseline per patient's report. States her L>R LE edema is unchanged from the time she had a negative doppler u/s in 04/2011.

## 2012-05-04 NOTE — Progress Notes (Signed)
Case discussed with Dr. McTyre at the time of the visit, immediately after the resident saw the patient.  I reviewed the resident's history and exam and pertinent patient test results.  I agree with the assessment, diagnosis and plan of care documented in the resident's note.  

## 2012-05-04 NOTE — Assessment & Plan Note (Signed)
No red flag symptoms. Patient was counseled on the importance of taking PPI medications at least 30 minutes prior to meals. She is taking Protonix twice a day, she was instructed to take this medication was 30 minutes before breakfast and 30 minutes before dinner. She was also instructed not to lie flat within 3 hours of the meal and to limit caffeine consumption. - Continue Protonix 40 mg twice a day - Prevention issues addressed per above - RTC for followup on this issue if complaints do not improve within one month

## 2012-05-15 IMAGING — CT CT ABD-PELV W/ CM
2 of 6 series · 17 of 46 positions shown, 19 images · IV contrast (agent unspecified)
Comparison: 12/14/2006.

CLINICAL DATA: Bilateral lower abdominal pain.  Diarrhea.  History
available bowel syndrome, high blood pressure and hysterectomy.

CT ABDOMEN AND PELVIS WITH CONTRAST
TECHNIQUE: Multidetector CT imaging of the abdomen and pelvis was
performed following the standard protocol during bolus
administration of intravenous contrast.
Contrast: 60 ml Emnipaque-WQQ.

[Series 4: abd/pelv with 2.0 b31f st · axial · 0.78mm/px · z∈[-414,-24]mm · 14 of 428 slices shown, 16 images]
[im 19/428  soft-tissue]
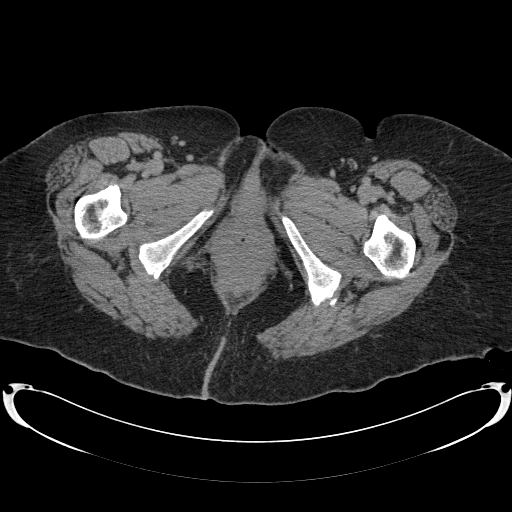
[im 19/428  bone]
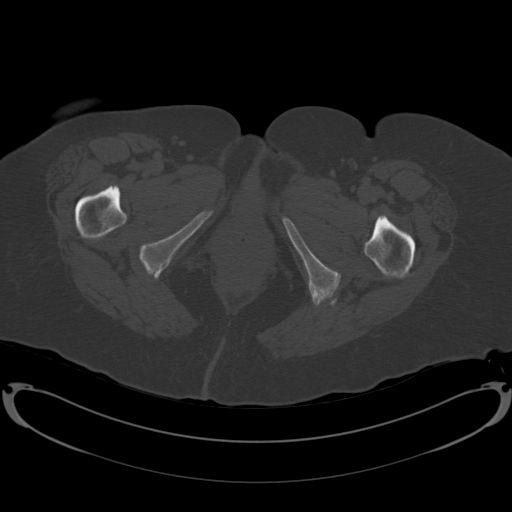
[im 56/428  soft-tissue]
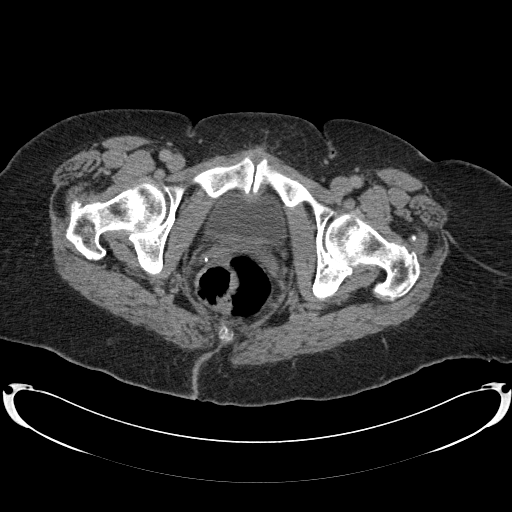
[im 75/428  soft-tissue]
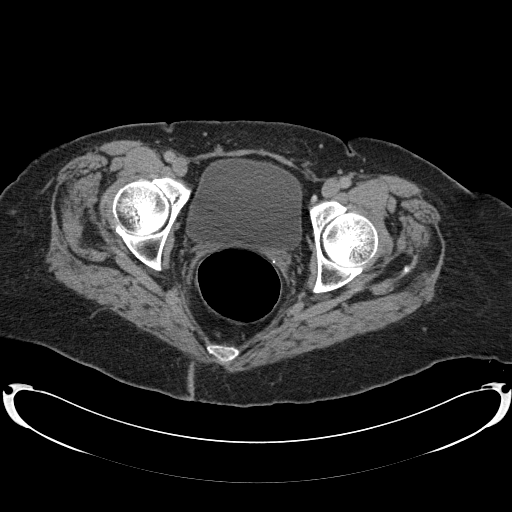
[im 112/428  soft-tissue]
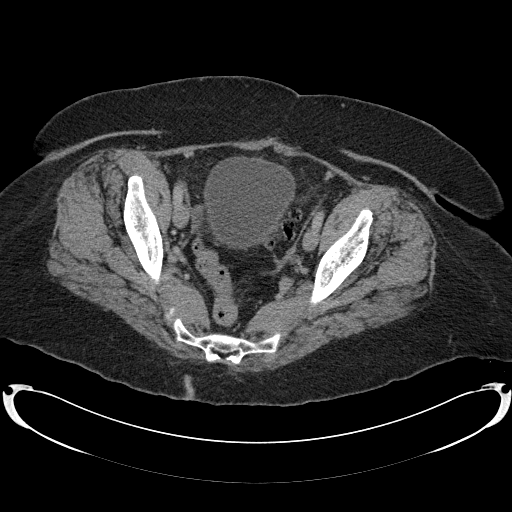
[im 149/428  soft-tissue]
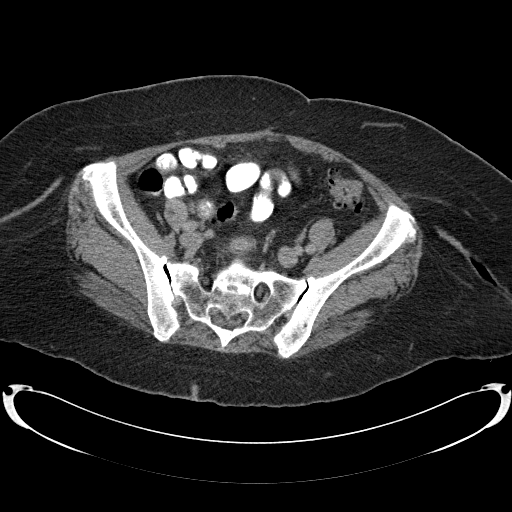
[im 168/428  soft-tissue]
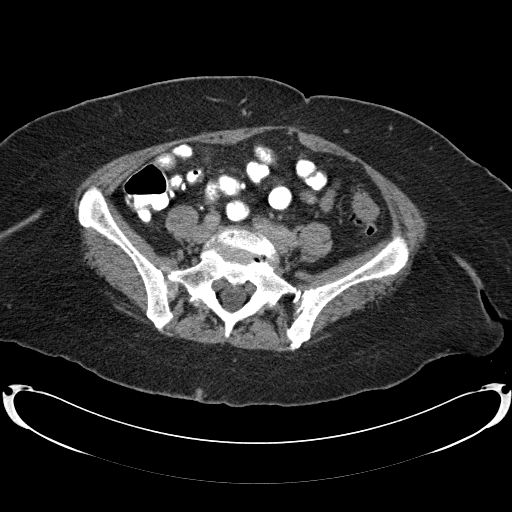
[im 205/428  soft-tissue]
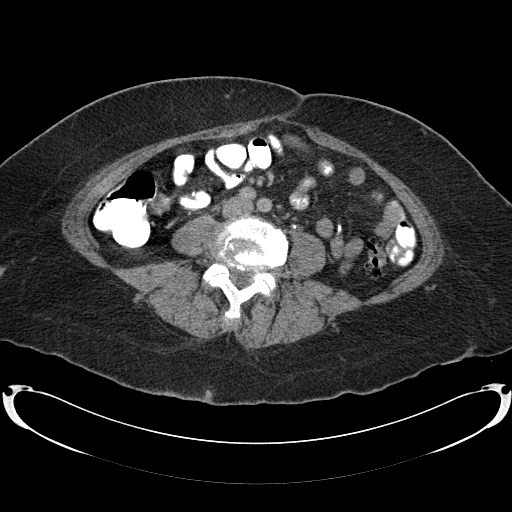
[im 223/428  soft-tissue]
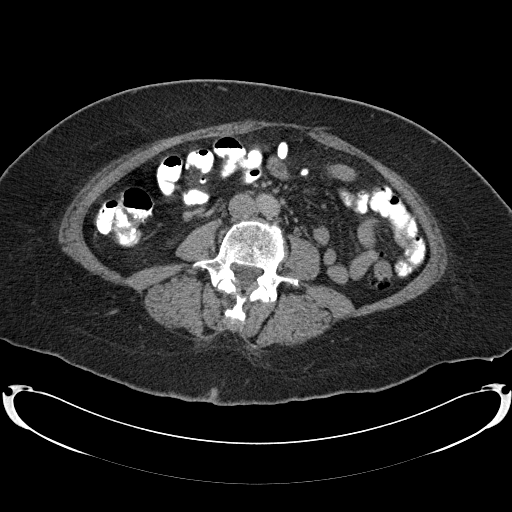
[im 260/428  soft-tissue]
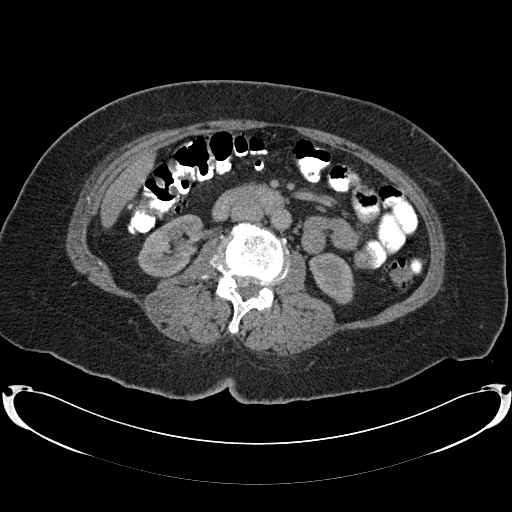
[im 260/428  bone]
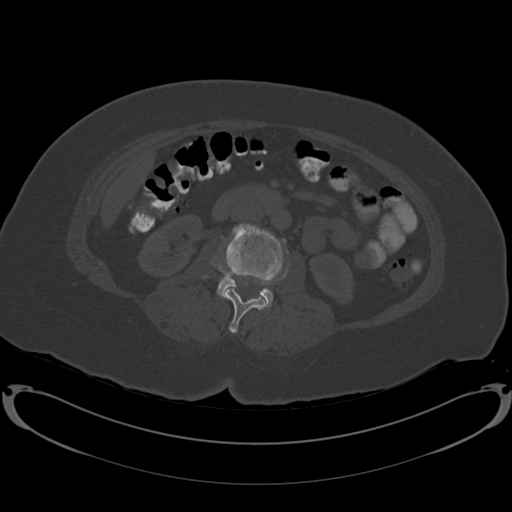
[im 279/428  soft-tissue]
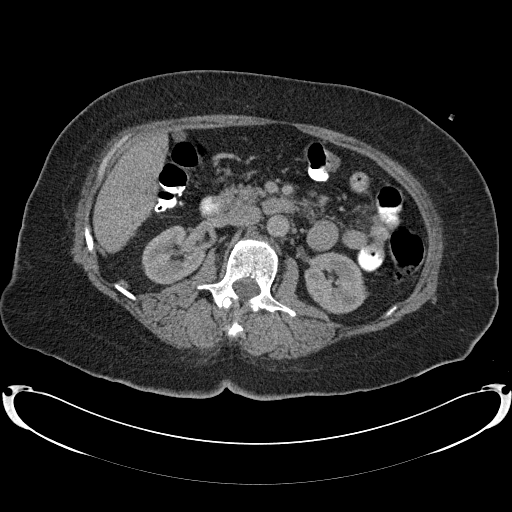
[im 316/428  soft-tissue]
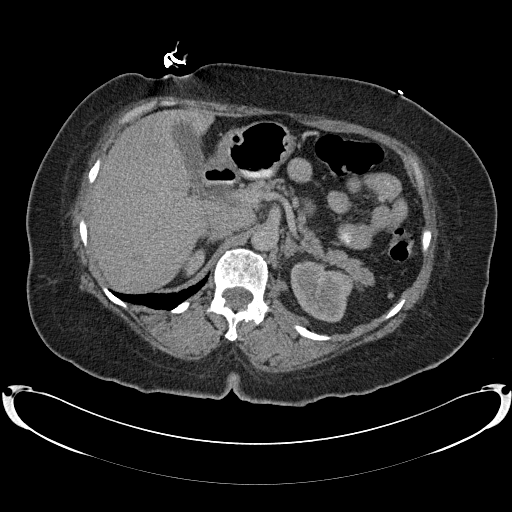
[im 353/428  soft-tissue]
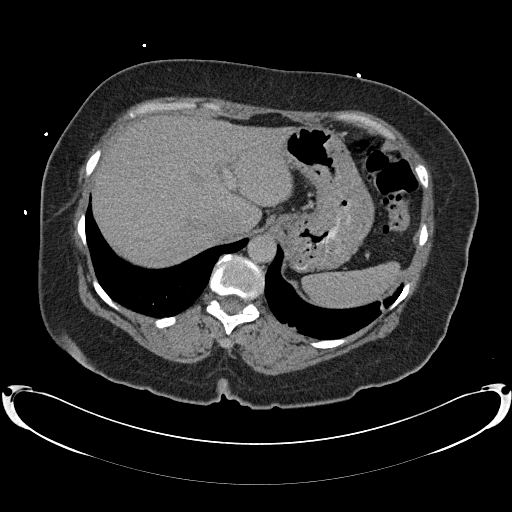
[im 372/428  soft-tissue]
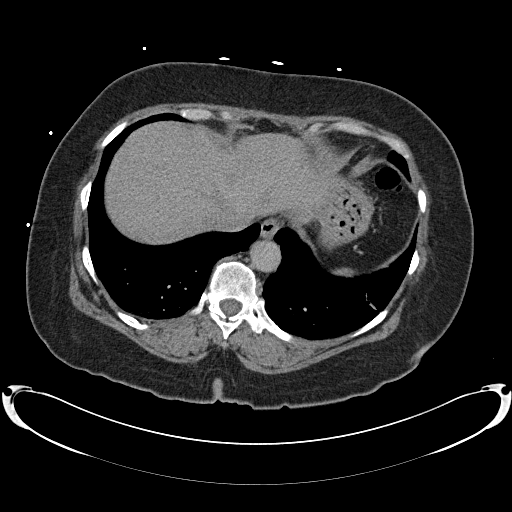
[im 409/428  soft-tissue]
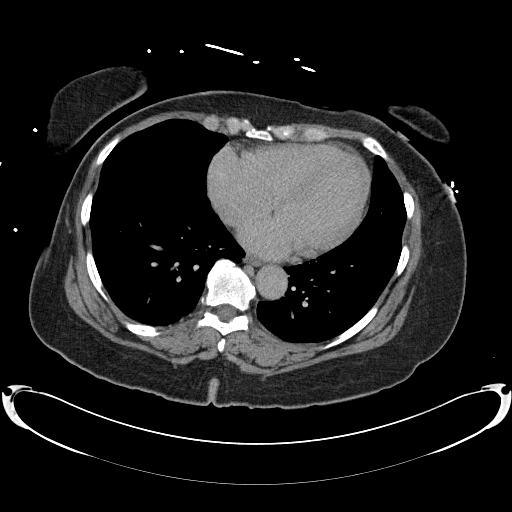

[Series 602: coronals · coronal · 0.84mm/px · 3 of 84 slices shown]
[im 28/84  soft-tissue]
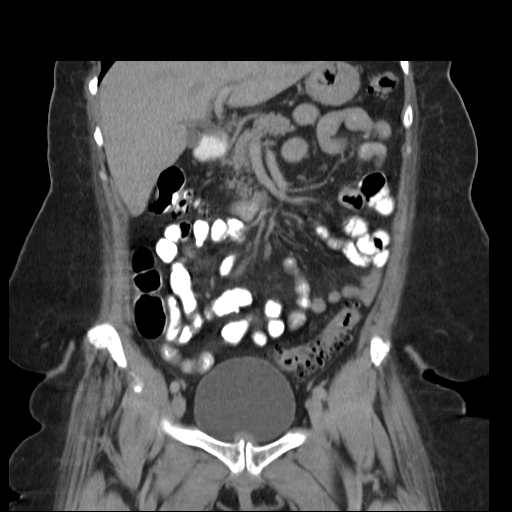
[im 37/84  soft-tissue]
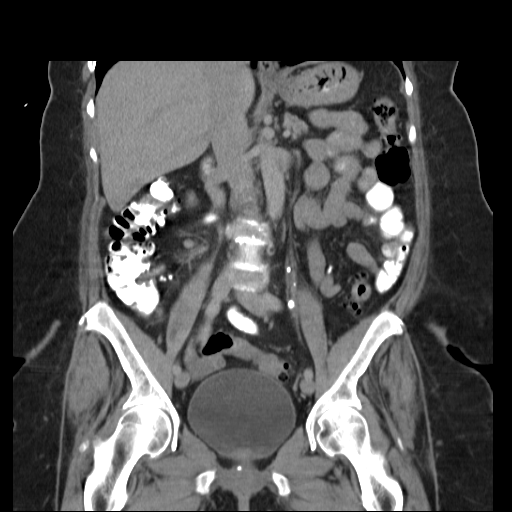
[im 47/84  soft-tissue]
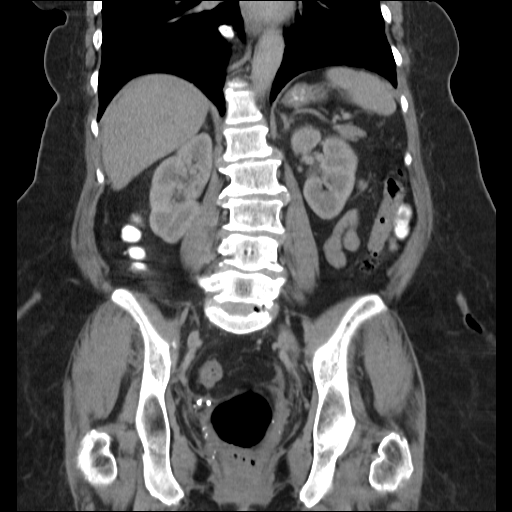

[17 of 46 positions shown; findings below may reference images not displayed]

FINDINGS: Small amount of free flow within the right aspect of the
pelvis.  Source of this is indeterminate.  There are diverticula
throughout the colon most notable left colon and sigmoid colon
where there is associated muscular hypertrophy but without evidence
of extraluminal inflammatory process.  Appendix not visualized.

Distal transverse colon with a 3 cm segment of narrowing.  It is
possible this represents peristalsis although underlying mass not
excluded.

Mild fatty infiltration liver without focal hepatic lesion.  No
calcified gallstones.  No focal splenic, adrenal, renal or
pancreatic lesion.

Scarring lung bases.

Bilateral sacroiliac joint degenerative changes.

Scoliosis and degenerative changes with various degrees of spinal
stenosis with disc protrusions most notable L2-3, L4-5 and L5-S1.

Noncontrast filled views of the urinary bladder unremarkable.
Asymmetric appearance of the ovaries without significant change
since prior exam.
IMPRESSION: Small amount of free fluid within the pelvis.  Source
indeterminate.

Circumferential narrowing ( spanning over 3 cm) of the distal
transverse colon. Question mass versus result of peristalsis.

Diverticulosis of left colon and sigmoid colon.

Scoliosis and prominent degenerative changes.

## 2012-06-05 ENCOUNTER — Ambulatory Visit (INDEPENDENT_AMBULATORY_CARE_PROVIDER_SITE_OTHER): Payer: PRIVATE HEALTH INSURANCE | Admitting: Internal Medicine

## 2012-06-05 ENCOUNTER — Encounter: Payer: Self-pay | Admitting: Internal Medicine

## 2012-06-05 VITALS — BP 142/85 | HR 63 | Temp 97.9°F | Ht 61.75 in | Wt 202.9 lb

## 2012-06-05 DIAGNOSIS — M159 Polyosteoarthritis, unspecified: Secondary | ICD-10-CM

## 2012-06-05 DIAGNOSIS — R300 Dysuria: Secondary | ICD-10-CM | POA: Insufficient documentation

## 2012-06-05 LAB — POCT URINALYSIS DIPSTICK
Ketones, UA: NEGATIVE
Protein, UA: NEGATIVE
Spec Grav, UA: 1.015
pH, UA: 6.5

## 2012-06-05 NOTE — Assessment & Plan Note (Signed)
Patient has multiple joint osteoarthritis for long time. Possible flareup of osteoarthritis without any significant trigger.  - Patient has been seen by sports medicine clinic about 2 years before and had steroid shots in sacroiliac joint. - Rereferral to sports recently for further help in management and see if she needs any further steroid injections. - Otherwise conservative management.

## 2012-06-05 NOTE — Assessment & Plan Note (Addendum)
Possible lower UTI. - Will check urine dipstick and do further management from there. - Dipstick negative.  - Unclear etiology.  - Patient will call if symptoms don't get better or get worse.

## 2012-06-05 NOTE — Patient Instructions (Signed)
Please make a followup appointment as needed.  Followup with sports medicine clinic for leg pain.  Continue taking all medications regularly as you do.

## 2012-06-05 NOTE — Progress Notes (Signed)
Case discussed with Dr. Ravi Patel  at the time of the visit, immediately after the resident saw the patient.  I reviewed the resident's history and exam and pertinent patient test results.  I agree with the assessment, diagnosis and plan of care documented in the resident's note.  

## 2012-06-05 NOTE — Progress Notes (Signed)
  Subjective:    Patient ID: Jennifer Owen, female    DOB: [birth year], 79 y.o.   MRN: [id number]  HPI pressure is a pleasant 13 year woman with hypertension, hyperlipidemia, osteoarthritis of multiple joints and other problems as per problem list who comes to the clinic for worsening bilateral lower extremity pain for past 2 and half weeks and dysuria for past 2 weeks.  She was on a vacation at [facility] with family for past 2 weeks when she started having symptoms. She reports bilateral ankle and leg pain, significantly more on left compared to right. Pain starts in the ankles and goes up to the knee. No significant swelling of her ankle joint. No redness or increased warmth of lower extremity. She denies any trauma, twisting the ankle, fever, chills, nausea vomiting, chest pain, short of breath, headache, palpitations. She is taking Vicodin with some relief of pain  She also reports stinging sensation with urination for past [redacted] weeks along with suprapubic pain and urgency of urination. She denies any fever, nausea vomiting.   Review of Systems    as per history of present illness Objective:   Physical Exam  General: NAD HEENT: PERRL, EOMI, no scleral icterus Cardiac: S1, S2, RRR, no rubs, murmurs or gallops Pulm: clear to auscultation bilaterally, moving normal volumes of air Abd: soft, nontender, nondistended, BS present Ext: warm and well perfused, no pedal edema Musculoskeletal: Bilateral knee replacement scars. Normal range of motion of knee and ankle joints without swelling or redness. Tenderness of left leg. No tenderness of ankle. Neuro: alert and oriented X3, cranial nerves II-XII grossly intact       Assessment & Plan:

## 2012-06-15 ENCOUNTER — Ambulatory Visit (INDEPENDENT_AMBULATORY_CARE_PROVIDER_SITE_OTHER): Payer: PRIVATE HEALTH INSURANCE | Admitting: Family Medicine

## 2012-06-15 ENCOUNTER — Other Ambulatory Visit: Payer: Self-pay | Admitting: *Deleted

## 2012-06-15 VITALS — BP 137/83 | Ht 62.0 in | Wt 202.0 lb

## 2012-06-15 DIAGNOSIS — M25562 Pain in left knee: Secondary | ICD-10-CM

## 2012-06-15 DIAGNOSIS — Z96659 Presence of unspecified artificial knee joint: Secondary | ICD-10-CM

## 2012-06-15 DIAGNOSIS — M25569 Pain in unspecified knee: Secondary | ICD-10-CM

## 2012-06-15 DIAGNOSIS — M159 Polyosteoarthritis, unspecified: Secondary | ICD-10-CM

## 2012-06-15 MED ORDER — HYDROCODONE-ACETAMINOPHEN 5-325 MG PO TABS: ORAL_TABLET | ORAL | Status: DC

## 2012-06-15 NOTE — Progress Notes (Signed)
  Subjective:    Patient ID: Jennifer Owen, female    DOB: 1931-04-30, 79 y.o.   MRN: [id number]  HPI  Bilateral knee and lower leg pain worse over the last 2-3 months. Status post bilateral total knee replacement. One of them was done 10 years ago one of them was done 12 years ago by her report. She had no pain until recently in the last year or so and then the last couple of months it's gotten much worse. Similar to the arthritic type pain she had prior to the knee replacements. She walks with a cane. She takes one Vicodin at night.  Review of Systems Denies numbness or tingling in her feet, she's had no giving way of the knee. Denies fever, sweats, chills.    Objective:   Physical Exam  Vital signs are reviewed GENERAL: Well-developed elderly female walking with a cane, no acute distress KNEES: Bilaterally full range of flexion and extension. No effusion, no erythema, no warmth. Distally she is  NV intact. Bilaterally the calves are soft.      Assessment & Plan:  #1. Bilateral knee pain status post bilateral TKR. Given the age of her prostheses, I think we need to reevaluate with x-ray and we have ordered that. I suspect she just not using enough pain medicine. I will increase her pain medicine to one Vicodin 3 times a day and have given her prescription for one month. I'll see her back in the next 3 or 4 weeks.

## 2012-06-15 NOTE — Patient Instructions (Signed)
I am increasing your pain medicine to three times a day. I will send a note to your regular doctor about this. We will call in one month worth and I will see you back in one month. We will also get your knee x rays done. Great to see you!

## 2012-06-19 ENCOUNTER — Ambulatory Visit (HOSPITAL_COMMUNITY)
Admission: RE | Admit: 2012-06-19 | Discharge: 2012-06-19 | Disposition: A | Discharge status: Home / Self Care | Payer: PRIVATE HEALTH INSURANCE | Source: Ambulatory Visit | Attending: Family Medicine | Admitting: Family Medicine

## 2012-06-19 DIAGNOSIS — M25569 Pain in unspecified knee: Secondary | ICD-10-CM | POA: Insufficient documentation

## 2012-06-19 DIAGNOSIS — M159 Polyosteoarthritis, unspecified: Secondary | ICD-10-CM

## 2012-06-19 DIAGNOSIS — M25561 Pain in right knee: Secondary | ICD-10-CM

## 2012-06-19 DIAGNOSIS — Z96659 Presence of unspecified artificial knee joint: Secondary | ICD-10-CM

## 2012-06-21 ENCOUNTER — Encounter: Payer: Self-pay | Admitting: Family Medicine

## 2012-07-09 ENCOUNTER — Ambulatory Visit (INDEPENDENT_AMBULATORY_CARE_PROVIDER_SITE_OTHER): Payer: PRIVATE HEALTH INSURANCE | Admitting: Family Medicine

## 2012-07-09 VITALS — BP 126/73 | Ht 62.0 in | Wt 202.0 lb

## 2012-07-09 DIAGNOSIS — M159 Polyosteoarthritis, unspecified: Secondary | ICD-10-CM

## 2012-07-10 NOTE — Progress Notes (Signed)
  Subjective:    Patient ID: Jennifer Owen, female    DOB: 04-09-1931, 79 y.o.   MRN: [id number]  HPI Followup bilateral knee pain. We had gotten some x-rays to further evaluate the status of her bilateral total knee replacements. X-ray showed no lucency, no signs of loosening or infection.   Review of Systems No fever.    Objective:   Physical Exam  Vital signs are reviewed GENERAL: Well-developed female no acute distress IMAGING: I reviewed each of her pictures with her in specific detail. There is no sign of prosthetic loosening.      Assessment & Plan:  #1. Bilateral knee pain status post bilateral TKR. At this well think we have anything additional to offer her. There is no need for further orthopedic intervention do to any type of prosthetic problem as the x-ray showed there in pretty good shape. I think she'll just have to pursue pain management with her primary care provider. Some weight loss might be beneficial and we discussed that.

## 2012-07-19 ENCOUNTER — Other Ambulatory Visit: Payer: Self-pay | Admitting: Internal Medicine

## 2012-07-20 ENCOUNTER — Other Ambulatory Visit: Payer: Self-pay | Admitting: *Deleted

## 2012-07-20 NOTE — Telephone Encounter (Signed)
Got #90 hydrocodone from Dr Jennette Kettle on 06/15/12 which is violation of her contract. And on contract for 30 per month so still has enough left. Pls make certain she has appt so that we can review terms of contract.

## 2012-07-20 NOTE — Telephone Encounter (Signed)
Pt has an appt 7/16; pt informed to keep appt to discuss med contract.

## 2012-07-25 ENCOUNTER — Encounter: Payer: Self-pay | Admitting: Internal Medicine

## 2012-07-25 ENCOUNTER — Ambulatory Visit (INDEPENDENT_AMBULATORY_CARE_PROVIDER_SITE_OTHER): Payer: PRIVATE HEALTH INSURANCE | Admitting: Internal Medicine

## 2012-07-25 VITALS — BP 142/69 | HR 51 | Temp 97.0°F | Ht 63.5 in | Wt 205.4 lb

## 2012-07-25 DIAGNOSIS — M159 Polyosteoarthritis, unspecified: Secondary | ICD-10-CM

## 2012-07-25 DIAGNOSIS — K219 Gastro-esophageal reflux disease without esophagitis: Secondary | ICD-10-CM

## 2012-07-25 MED ORDER — HYDROCODONE-ACETAMINOPHEN 5-325 MG PO TABS: ORAL_TABLET | ORAL | Status: DC

## 2012-07-25 MED ORDER — ESOMEPRAZOLE MAGNESIUM 40 MG PO CPDR: 40.0000 mg | DELAYED_RELEASE_CAPSULE | Freq: Every day | ORAL | Status: DC

## 2012-07-25 NOTE — Assessment & Plan Note (Addendum)
Stable. Given her complaint of left calf pain and history of PE I did measure out her calves, which were the same circumference. No tenderness, erythema, palpable cord. Patient confirms that this is her typical pain. Suspicion for DVT low; no need for dopplers at this time. - Continue pain management with Vicodin 5/325 Q8H PRN #90 - Pain contract re-signed and principles reinforced - Urine drug screen ordered - Encouraged her to continue weight loss efforts through water aerobics  ADDENDUM: UDS negative for opiates, but she finished her Vicodin a few days before the office visit and the length of time it can be detected in the urine after use is ~2 days. Low concern for selling/distributing her narcotics, but will repeat UDS at next refill visit when I am more she has been taking her medicine.

## 2012-07-25 NOTE — Patient Instructions (Addendum)
Thanks for your visit. - I have phoned in your Vicodin prescription to [facility] - We we do a urine drug screen test today - Continue the good work towards fitness and weight loss with water aerobics - Please return in 3 months

## 2012-07-25 NOTE — Progress Notes (Signed)
  Subjective:    Patient ID: Jennifer Owen, female    DOB: October 16, 1931, 79 y.o.   MRN: [id number]  HPI Jennifer Owen is a pleasant 25 year woman with hypertension, hyperlipidemia, osteoarthritis of multiple joints who presents for medication refill.  She has a history of chronic bilateral knee pain s/p bilateral total knee replacements 10-12 years ago. The pain is worst in her right anterior, medial joint space and left popliteal and calf region. She is followed by sports medicine who deemed no need for further orthopedic interventions and recommended weight loss and pain management through our office.  She has a medication contract with [place] for Vicodin 5/325 QHS #30. In June Dr. Jennette Kettle prescribed Vicodin 5/325 Q8H PRN #90, and the patient reports better control of her pain with the additional 1-2 pills per day. Though it was technically a violation of our pain contract for her to get her Vicodin from another provider, search of the [place] controlled substances database shows she has not received pain medication from any other offices and my suspicion for abuse or illicit resale is low in this patient.  For exercise, she goes to [facility] for water aerobics 3 days a week.  She would like me to change her PPI to Nexium as she has had better control of her GERD with it vs. Protonix in the past.   Review of Systems  Constitutional: Negative for fever and chills.  HENT: Negative for congestion and rhinorrhea.   Eyes: Negative for visual disturbance.  Respiratory: Negative for cough and shortness of breath.   Cardiovascular: Negative for chest pain and leg swelling.  Gastrointestinal: Negative for nausea, vomiting, abdominal pain and diarrhea.  Endocrine: Negative for polydipsia and polyuria.  Genitourinary: Negative for dysuria, frequency and hematuria.  Musculoskeletal: Positive for arthralgias. Negative for joint swelling and gait problem.  Skin: Negative for rash.  Neurological: Negative for weakness,  light-headedness, numbness and headaches.  Hematological: Negative for adenopathy.  Psychiatric/Behavioral: Negative for behavioral problems.       Objective:   Physical Exam  Constitutional: She is oriented to person, place, and time. She appears well-developed and well-nourished.  HENT:  Head: Normocephalic and atraumatic.  Eyes: Conjunctivae and EOM are normal. Pupils are equal, round, and reactive to light.  Neck: Normal range of motion. Neck supple.  Cardiovascular: Normal rate, regular rhythm, normal heart sounds and intact distal pulses.   Pulmonary/Chest: Effort normal and breath sounds normal.  Abdominal: Soft. Bowel sounds are normal.  Musculoskeletal: Normal range of motion. She exhibits no edema.       Right knee: She exhibits no effusion, no deformity and no erythema.       Left knee: She exhibits no effusion, no deformity and no erythema.  Right and left calves measure the same at 48cm. No erythema, tenderness, or palpable cord.  Lymphadenopathy:    She has no cervical adenopathy.  Neurological: She is alert and oriented to person, place, and time.  Skin: Skin is warm and dry.          Assessment & Plan:

## 2012-07-25 NOTE — Progress Notes (Signed)
Hydrocodone rx called to [facility].

## 2012-07-25 NOTE — Assessment & Plan Note (Signed)
Stable. - Changed her PPI back to Nexium 40mg  daily at her request - Counseled her on importance of taking the medicine 30 minutes before breakfast

## 2012-07-26 ENCOUNTER — Other Ambulatory Visit: Payer: Self-pay | Admitting: Internal Medicine

## 2012-07-26 DIAGNOSIS — Z1231 Encounter for screening mammogram for malignant neoplasm of breast: Secondary | ICD-10-CM

## 2012-07-26 LAB — PRESCRIPTION ABUSE MONITORING 15P, URINE
Buprenorphine, Urine: NEGATIVE ng/mL
Cannabinoid Scrn, Ur: NEGATIVE ng/mL
Cocaine Metabolites: NEGATIVE ng/mL
Creatinine, Urine: 116.76 mg/dL (ref >20.0)
Methadone Screen, Urine: NEGATIVE ng/mL
Opiate Screen, Urine: NEGATIVE ng/mL
Oxycodone Screen, Ur: NEGATIVE ng/mL
Zolpidem, Urine: NEGATIVE ng/mL

## 2012-07-27 NOTE — Progress Notes (Signed)
I saw and evaluated the patient.  I personally confirmed the key portions of the history and exam documented by Dr. Cater and I reviewed pertinent patient test results.  The assessment, diagnosis, and plan were formulated together and I agree with the documentation in the resident's note. 

## 2012-08-07 ENCOUNTER — Ambulatory Visit (HOSPITAL_COMMUNITY)
Admission: RE | Admit: 2012-08-07 | Discharge: 2012-08-07 | Disposition: A | Discharge status: Home / Self Care | Payer: PRIVATE HEALTH INSURANCE | Source: Ambulatory Visit | Attending: Internal Medicine | Admitting: Internal Medicine

## 2012-08-07 DIAGNOSIS — Z1231 Encounter for screening mammogram for malignant neoplasm of breast: Secondary | ICD-10-CM | POA: Insufficient documentation

## 2012-09-18 ENCOUNTER — Encounter: Payer: Self-pay | Admitting: Internal Medicine

## 2012-09-18 ENCOUNTER — Ambulatory Visit (INDEPENDENT_AMBULATORY_CARE_PROVIDER_SITE_OTHER): Payer: PRIVATE HEALTH INSURANCE | Admitting: Internal Medicine

## 2012-09-18 VITALS — BP 139/71 | HR 54 | Temp 97.0°F | Ht 64.0 in | Wt 202.2 lb

## 2012-09-18 DIAGNOSIS — M159 Polyosteoarthritis, unspecified: Secondary | ICD-10-CM

## 2012-09-18 DIAGNOSIS — Z23 Encounter for immunization: Secondary | ICD-10-CM

## 2012-09-18 DIAGNOSIS — K219 Gastro-esophageal reflux disease without esophagitis: Secondary | ICD-10-CM

## 2012-09-18 DIAGNOSIS — I10 Essential (primary) hypertension: Secondary | ICD-10-CM

## 2012-09-18 MED ORDER — LISINOPRIL 40 MG PO TABS: 40.0000 mg | ORAL_TABLET | Freq: Every day | ORAL | Status: DC

## 2012-09-18 NOTE — Assessment & Plan Note (Signed)
Patient still complains of knee pain, but not more than usual. Also has ankle and hip pain as well. Has been taking Vicodin for this. Wanted to know if it was okay to take Tylenol Arthritis OTC instead as this has worked in the past. Told her this was okay as long as she does not take both Vicodin and Tylenol at the same time for pain. Patient understood clearly the risks of doing so. -Continues to exercise daily at the [facility] including strength exercises as well as swimming.

## 2012-09-18 NOTE — Patient Instructions (Addendum)
General Instructions: 1. You have hospital follow up appointments as follows:  10/17/12 w/ Dr. Claudell Kyle  2. Please take all medications as prescribed.  3. If you have worsening of your symptoms or new symptoms arise, please call the clinic ([phone]), or go to the ER immediately if symptoms are severe.  You have done great job in taking all your medications. I appreciate it very much. Please continue doing that.  Treatment Goals:  Goals (1 Years of Data) as of 09/18/12   None      Progress Toward Treatment Goals:  Treatment Goal 09/18/2012  Blood pressure at goal    Self Care Goals & Plans:  Self Care Goal 09/18/2012  Manage my medications take my medicines as prescribed; bring my medications to every visit; refill my medications on time  Monitor my health -  Eat healthy foods drink diet soda or water instead of juice or soda; eat more vegetables; eat foods that are low in salt  Be physically active -       Care Management & Community Referrals:  Referral 09/18/2012  Referrals made for care management support none needed

## 2012-09-18 NOTE — Assessment & Plan Note (Signed)
Patient has been having ongoing GERD symptoms, specifically in the AM when she wakes up. Wanted to know if it was okay to take her Nexium when she wakes up rather than before a meal. -Will continue to take early in the morning at this time. GERD symptoms well controlled when she does take the Nexium. Recently switched from protonix.

## 2012-09-18 NOTE — Progress Notes (Signed)
Subjective:   Patient ID: LEEAN Owen female   DOB: [birth year] 79 y.o.   MRN: [id number]  HPI: Jennifer Owen is a 79 y.o. F w/ PMHx of HTN, HLD, osteopenia, GERD, previous PE, Polio, and OA, presents today for follow up. Her biggest complaint is her joint pain, which causes her issues in multiple joints including hips, ankles, shoulder, but mostly her knees, both of which she has had replaced (1995, 2004). She currently takes Vicodin 5-325 q8h for pain. She also follows with Dr. Jennette Owen in sports medicine for her joint pains. She says these pains are present throughout the day. She currently tries to be very active and exercises almost every days. She swims and does walking exercises, which she was previously instructed to do by PT in the past.  She also complains of GERD symptoms, mostly when she wakes up in the morning. She was recently switched to Nexium for Protonix, which has been working. She has been taking her medication in the AM as opposed to before meals because her symptoms are most prominent when she wakes up. She also complains of some right sided chest and shoulder pain, mostly when she exerts herself. She follows with [facility] and has an appointment in October for follow-up. According to the patient, she had a stress test ~6 months ago and said that it was completely normal. She was given NTG for chest pain that she says relieves her pain. She also claims that rest also gives her relief. She denies any associated nausea, vomiting, dizziness, lightheadedness, or palpitations, but does claim to have some SOB during her episodic chest pain.  Otherwise, she has no complaints. She denies any recent issues with constipation or diarrhea, no dysuria or change in urinary frequency.   Past Medical History  Diagnosis Date  . GERD (gastroesophageal reflux disease)     Papulous gastropathy EGD Oct. 2007  . Hypertension   . Low back pain     intermittent radiculopathy  .  Osteoarthritis   . Pulmonary embolism     2/2 knee replacement  . Osteopenia     T score-1.1 R Fremur, - 0.4 L spine  . Hyperlipemia   . Polio 1942  . Urinary incontinence   . IBS (irritable bowel syndrome)    Current Outpatient Prescriptions  Medication Sig Dispense Refill  . amitriptyline (ELAVIL) 25 MG tablet Take 1 tablet (25 mg total) by mouth at bedtime.  30 tablet  0  . aspirin 81 MG chewable tablet Chew 81 mg by mouth daily.        Marland Kitchen esomeprazole (NEXIUM) 40 MG capsule Take 1 capsule (40 mg total) by mouth daily before breakfast.  30 capsule  11  . fish oil-omega-3 fatty acids 1000 MG capsule Take 2 g by mouth daily.      . hydrochlorothiazide (HYDRODIURIL) 25 MG tablet Take 1 tablet (25 mg total) by mouth daily.  30 tablet  11  . HYDROcodone-acetaminophen (NORCO/VICODIN) 5-325 MG per tablet Take one tablet by mouth every 8 hours as needed for knee pain  90 tablet  3  . lisinopril (PRINIVIL,ZESTRIL) 40 MG tablet Take 1 tablet (40 mg total) by mouth daily.  30 tablet  11  . Multiple Vitamin (MULTIVITAMIN WITH MINERALS) TABS Take 1 tablet by mouth daily.      Marland Kitchen NITROSTAT 0.4 MG SL tablet PLACE 1 TABLET (0.4 MG TOTAL) UNDER THE TONGUE EVERY 5 (FIVE) MINUTES AS NEEDED FOR CHEST PAIN.  25 tablet  1  . traZODone (DESYREL) 50 MG tablet TAKE 1 TABLET BY MOUTH AT BEDTIME AS NEEDED FOR SLEEP.  30 tablet  2  . [DISCONTINUED] simvastatin (ZOCOR) 20 MG tablet Take 20 mg by mouth every evening.       No current facility-administered medications for this visit.   Family History  Problem Relation Age of Onset  . Cancer Daughter     colon caner, <60   History   Social History  . Marital Status: Legally Separated    Spouse Name: N/A    Number of Children: 7  . Years of Education: N/A   Occupational History  . 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 [employer]    Social History Main Topics  . Smoking status: Former Games developer  . Smokeless tobacco: None  . Alcohol Use: No  . Drug Use: No    . Sexual Activity: None   Other Topics Concern  . None   Social History Narrative   [facility]   Review of Systems: General: Denies fever, chills, diaphoresis, appetite change and fatigue.  HEENT: Denies change in vision, eye pain, redness, hearing loss, congestion, sore throat, rhinorrhea, sneezing, mouth sores, trouble swallowing, neck pain, neck stiffness and tinnitus.   Respiratory: Positive for intermittent DOE. Denies cough, chest tightness, and wheezing.   Cardiovascular: Positive for exertional right-sided chest pain. Denies palpitations and leg swelling.  Gastrointestinal: Positive for reflux/abdominal pain. Denies nausea, vomiting, diarrhea, constipation, blood in stool and abdominal distention.  Genitourinary: Denies dysuria, urgency, frequency, hematuria, flank pain and difficulty urinating.  Musculoskeletal: Positive for arthralgias; knees, shoulders, hips, ankles. Denies myalgias, back pain, joint swelling, and gait problem.  Skin: Denies pallor, rash and wounds.  Neurological: Denies dizziness, seizures, syncope, weakness, lightheadedness, numbness and headaches.  Psychiatric/Behavioral: Denies mood changes, confusion, nervousness, sleep disturbance and agitation.  Objective:  Physical Exam: Filed Vitals:   09/18/12 0834  BP: 139/71  Pulse: 54  Temp: 97 F (36.1 C)  TempSrc: Oral  Height: 5\' 4"  (1.626 m)  Weight: 202 lb 3.2 oz (91.717 kg)  SpO2: 100%   General: Vital signs reviewed.  Patient is a well-developed and well-nourished, in no acute distress and cooperative with exam. Alert and oriented x3.  Head: Normocephalic and atraumatic. Nose: No erythema or drainage noted.  Turbinates normal. Mouth: No erythema, exudates, sores, or ulcerations. Moist mucus membranes. Eyes: PERRL, EOMI, conjunctivae normal, No scleral icterus.  Neck: Supple, trachea midline, normal ROM, No JVD, masses, thyromegaly, or carotid bruit present.  Cardiovascular:  RRR, S1 normal, S2 normal, no murmurs, gallops, or rubs. Pulmonary/Chest: Normal respiratory effort, CTAB, no wheezes, rales, or rhonchi. Abdominal: Soft. Non-tender, non-distended, bowel sounds are normal, no masses, organomegaly, or guarding present.  Musculoskeletal: No joint deformities, erythema, or stiffness, ROM full and nontender. Extremities: No swelling or edema,  pulses symmetric and intact bilaterally. No cyanosis or clubbing. Neurological: A&O x3, Strength is normal and symmetric bilaterally, cranial nerve II-XII are grossly intact, no focal motor deficit, sensory intact to light touch bilaterally.  Skin: Warm, dry and intact. No rashes or erythema. Psychiatric: Normal mood and affect. speech and behavior is normal. Cognition and memory are normal.   Assessment & Plan:   Please see problem-based assessment and plan.

## 2012-09-18 NOTE — Assessment & Plan Note (Signed)
Well controlled today, 139/71.  -Continue with Lisinopril 40 mg daily + HCTZ 25 mg daily. Given refill of Lisinopril today.  -Will follow up with Dr. Claudell Kyle in ~ 1 month for PCP follow-up appointment.

## 2012-09-21 NOTE — Progress Notes (Signed)
I saw and evaluated the patient.  I personally confirmed the key portions of the history and exam documented by Dr. Jones and I reviewed pertinent patient test results.  The assessment, diagnosis, and plan were formulated together and I agree with the documentation in the resident's note.   

## 2012-10-08 ENCOUNTER — Telehealth: Payer: Self-pay | Admitting: *Deleted

## 2012-10-08 NOTE — Telephone Encounter (Signed)
Received form from pt's insurance requesting PA on hydrocodone-acetaminophen 5-325.  Form placed in pcp's box for completion.Criss Alvine, Debria Broecker [name]9/29/201410:36 AM

## 2012-10-10 ENCOUNTER — Encounter: Payer: Self-pay | Admitting: Internal Medicine

## 2012-10-17 ENCOUNTER — Encounter: Payer: PRIVATE HEALTH INSURANCE | Admitting: Internal Medicine

## 2012-10-18 NOTE — Telephone Encounter (Signed)
Received faxed form from [facility] stating that PA is not required at this time.  Request cancelled.Jennifer Spittle [name]10/9/201411:27 AM

## 2012-11-28 ENCOUNTER — Telehealth: Payer: Self-pay | Admitting: *Deleted

## 2012-11-28 ENCOUNTER — Ambulatory Visit (INDEPENDENT_AMBULATORY_CARE_PROVIDER_SITE_OTHER): Payer: Medicare Other | Admitting: Podiatrist

## 2012-11-28 ENCOUNTER — Encounter: Payer: Self-pay | Admitting: Internal Medicine

## 2012-11-28 ENCOUNTER — Ambulatory Visit (INDEPENDENT_AMBULATORY_CARE_PROVIDER_SITE_OTHER): Payer: PRIVATE HEALTH INSURANCE | Admitting: Internal Medicine

## 2012-11-28 ENCOUNTER — Encounter: Payer: Self-pay | Admitting: Podiatrist

## 2012-11-28 VITALS — BP 151/74 | HR 60 | Resp 12 | Ht 62.0 in | Wt 210.0 lb

## 2012-11-28 VITALS — BP 134/71 | HR 52 | Temp 98.2°F | Wt 201.8 lb

## 2012-11-28 DIAGNOSIS — M79609 Pain in unspecified limb: Secondary | ICD-10-CM

## 2012-11-28 DIAGNOSIS — I10 Essential (primary) hypertension: Secondary | ICD-10-CM

## 2012-11-28 DIAGNOSIS — B351 Tinea unguium: Secondary | ICD-10-CM

## 2012-11-28 DIAGNOSIS — K219 Gastro-esophageal reflux disease without esophagitis: Secondary | ICD-10-CM

## 2012-11-28 DIAGNOSIS — M1990 Unspecified osteoarthritis, unspecified site: Secondary | ICD-10-CM

## 2012-11-28 DIAGNOSIS — M159 Polyosteoarthritis, unspecified: Secondary | ICD-10-CM

## 2012-11-28 MED ORDER — HYDROCODONE-ACETAMINOPHEN 5-325 MG PO TABS: ORAL_TABLET | ORAL | Status: DC

## 2012-11-28 MED ORDER — DICLOFENAC SODIUM 1 % TD GEL: 2.0000 g | Freq: Four times a day (QID) | TRANSDERMAL | Status: DC

## 2012-11-28 MED ORDER — MELOXICAM 7.5 MG PO TABS: 7.5000 mg | ORAL_TABLET | Freq: Every day | ORAL | Status: DC

## 2012-11-28 NOTE — Progress Notes (Signed)
HPI:  Patient presents today for follow up of foot and nail care. Denies any new complaints today.  Objective:  Patients chart is reviewed.  Neurovascular status unchanged.  Patients nails are thickened, discolored, distrophic, friable and brittle with yellow-brown discoloration. Patient subjectively relates they are painful with shoes and with ambulation of bilateral feet.  Assessment:  Symptomatic onychomycosis  Plan:  Discussed treatment options and alternatives.  The symptomatic toenails were debrided through manual an mechanical means without complication.  Return appointment recommended at routine intervals of 3 months    Klarissa Mcilvain, DPM   

## 2012-11-28 NOTE — Progress Notes (Signed)
Subjective:    Patient ID: Jennifer Owen, female    DOB: 04/07/1931, 79 y.o.   MRN: [id number]  HPI Jennifer Owen is a pleasant 79 year woman with hypertension, hyperlipidemia, osteoarthritis of multiple joints who presents for routine follow up.   She has a history of chronic bilateral knee pain s/p bilateral total knee replacements 10-12 years ago. She is followed by sports medicine who deemed no need for further orthopedic interventions and recommended weight loss and pain management through our office. She has a medication contract with [place] for Vicodin 5/325 Q8H PRN #90, which she takes 0-3 times per day. She doesn't take it if she is not in pain.  However today she reports increasing pain in her ankle, knee, and hip on the left side. 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 The pain is aching and worse at the end of the day. She denies numbness or tingling. No bowel or bladder issues. Her Vicodin has not been helping as much with this flare as it usually does, and she asks for alternative pain relief. She has a hip XR from 08/2010 which shows minmal joint space narrowing.  For exercise, she goes to [facility] for water aerobics. However with the cold weather she has been less compliant with this regimen.  Her GERD is under better control after we changed her PPI to Nexium last visit. She has been taking it every morning before breakfast as instructed.   Her blood pressure is well controlled at 134/71 today.   Current Outpatient Prescriptions on File Prior to Visit  Medication Sig Dispense Refill  . amitriptyline (ELAVIL) 25 MG tablet Take 1 tablet (25 mg total) by mouth at bedtime.  30 tablet  0  . aspirin 81 MG chewable tablet Chew 81 mg by mouth daily.        Marland Kitchen esomeprazole (NEXIUM) 40 MG capsule Take 1 capsule (40 mg total) by mouth daily before breakfast.  30 capsule  11  . fish oil-omega-3 fatty acids 1000 MG capsule  Take 2 g by mouth daily.      . hydrochlorothiazide (HYDRODIURIL) 25 MG tablet Take 1 tablet (25 mg total) by mouth daily.  30 tablet  11  . lisinopril (PRINIVIL,ZESTRIL) 40 MG tablet Take 1 tablet (40 mg total) by mouth daily.  30 tablet  11  . Multiple Vitamin (MULTIVITAMIN WITH MINERALS) TABS Take 1 tablet by mouth daily.      . traZODone (DESYREL) 50 MG tablet TAKE 1 TABLET BY MOUTH AT BEDTIME AS NEEDED FOR SLEEP.  30 tablet  2  . NITROSTAT 0.4 MG SL tablet PLACE 1 TABLET (0.4 MG TOTAL) UNDER THE TONGUE EVERY 5 (FIVE) MINUTES AS NEEDED FOR CHEST PAIN.  25 tablet  1  . [DISCONTINUED] simvastatin (ZOCOR) 20 MG tablet Take 20 mg by mouth every evening.       No current facility-administered medications on file prior to visit.     Review of Systems  Constitutional: Negative for fever and chills.  Eyes: Negative for visual disturbance.  Respiratory: Negative for shortness of breath.   Cardiovascular: Negative for chest pain.  Gastrointestinal: Negative for abdominal pain.  Genitourinary: Negative for dysuria.  Musculoskeletal: Positive for arthralgias. Negative for joint swelling.  Skin: Negative for rash.  Neurological: Negative for dizziness and headaches.       Objective:   Physical Exam  Constitutional: She is oriented to person, place, and  time. She appears well-developed and well-nourished.  HENT:  Head: Normocephalic and atraumatic.  Cardiovascular: Normal rate, regular rhythm and normal heart sounds.  Exam reveals no gallop and no friction rub.   No murmur heard. Pulmonary/Chest: Effort normal and breath sounds normal. No respiratory distress. She has no wheezes. She has no rales. She exhibits no tenderness.  Abdominal: Soft. She exhibits no distension. There is no tenderness.  Musculoskeletal: Normal range of motion. She exhibits no edema.  Negative SLR. Aching pain during ROM exam, with no specific exacerbating movement. Minimal crepitus.  Neurological: She is alert and  oriented to person, place, and time. No cranial nerve deficit.  Skin: Skin is warm and dry.  Psychiatric: She has a normal mood and affect.          Assessment & Plan:

## 2012-11-28 NOTE — Patient Instructions (Signed)

## 2012-11-28 NOTE — Patient Instructions (Addendum)
Thank you for your visit. - I will reorder all of your medications electronically. - I have given your a paper prescription for your Vicodin as this cannot be electronically prescribed. I have give you three month's worth.  - I have prescribed Mobic and Voltaren gel for your left hip, knee, and ankle pain. Please use these as instructed for one month. If your pain has not improved, please return to the clinic. - Please continue to stretch and do water aerobics. This will help your arthritis pain. - Otherwise I will see you in 3 months for routine followup - Also, if you have it with you, please show the front desk your insurance card.

## 2012-11-28 NOTE — Telephone Encounter (Signed)
Pt asked if had an appt today.  Pt has a245pm appt today with Dr Irving Shows.  I left message [phone], called [phone] and [phone] but there was no answering service and pt left no other contact phone numbers.

## 2012-11-29 ENCOUNTER — Other Ambulatory Visit: Payer: Self-pay | Admitting: Internal Medicine

## 2012-11-29 NOTE — Progress Notes (Signed)
I saw and evaluated the patient.  I personally confirmed the key portions of the history and exam documented by Dr. Claudell Kyle and I reviewed pertinent patient test results.  The assessment, diagnosis, and plan were formulated together and I agree with the documentation in the resident's note. Ms Klindt is having 100 family members for Thanksgiving dinner. Has to host them at the church hall.

## 2012-11-29 NOTE — Assessment & Plan Note (Signed)
GERD symptoms well controlled with Nexium taken in the am before breakfast.

## 2012-11-29 NOTE — Assessment & Plan Note (Signed)
BP Readings from Last 3 Encounters:  11/28/12 151/74  11/28/12 134/71  09/18/12 139/71    Lab Results  Component Value Date   NA 140 10/18/2011   K 4.2 10/18/2011   CREATININE 1.05 10/18/2011    Assessment: Blood pressure control: controlled Progress toward BP goal:  at goal  Plan: Medications:  continue current medications Educational resources provided: brochure Self management tools provided: home blood pressure logbook

## 2012-11-29 NOTE — Assessment & Plan Note (Signed)
History and physical exam suggest arthritis flare as cause of her left-sided symptoms. Likely exacerbated by recent cold weather. - Prescribed one month's supply of NSAID, Mobic 7.5mg , she has no renal insufficiency at this time but will not provide refills given age - Prescribed Voltaren gel, topical NSAID - Patient will call or schedule another appointment if pain is not better after trying these therapies for a month - Encouraged continued stretching and water aerobics - Refilled Vicodin x3 months, paper prescriptions given to patient, noted on the rx to fill 11/28/12, 12/28/12, 01/28/13

## 2012-11-30 MED ORDER — TRAZODONE HCL 50 MG PO TABS: ORAL_TABLET | ORAL | Status: DC

## 2012-12-12 ENCOUNTER — Encounter: Payer: PRIVATE HEALTH INSURANCE | Admitting: Internal Medicine

## 2013-01-14 ENCOUNTER — Ambulatory Visit (INDEPENDENT_AMBULATORY_CARE_PROVIDER_SITE_OTHER): Payer: PRIVATE HEALTH INSURANCE | Admitting: Internal Medicine

## 2013-01-14 ENCOUNTER — Encounter: Payer: Self-pay | Admitting: Internal Medicine

## 2013-01-14 VITALS — BP 153/83 | HR 60 | Temp 97.0°F | Ht 64.0 in | Wt 196.2 lb

## 2013-01-14 DIAGNOSIS — G8929 Other chronic pain: Secondary | ICD-10-CM

## 2013-01-14 DIAGNOSIS — M159 Polyosteoarthritis, unspecified: Secondary | ICD-10-CM

## 2013-01-14 DIAGNOSIS — M25562 Pain in left knee: Principal | ICD-10-CM

## 2013-01-14 DIAGNOSIS — M25569 Pain in unspecified knee: Secondary | ICD-10-CM

## 2013-01-14 NOTE — Progress Notes (Signed)
   Subjective:    Patient ID: Jennifer Owen, female    DOB: 05/10/1931, 80 y.o.   MRN: [id number]  HPI [name] is a 80 yo AAW pmh as listed below who presents for chronic knee pain that is not improving.   Pt was recently seen in 11/14 for multiple OA pain and has had a hx of bilateral knee joint replacement. Was started on voltarn gel, Mobic and Vicodin for pain management. [facility]. She is starting to have trouble continuing this activites 2/2 to pain. She doesn't remember any recent trauma and uses a cane at baseline. She had seen a sports medicine physician several years ago and had bilateral knee replacements that are 6210+ years old.     Review of Systems  Constitutional: Positive for activity change (decreased 2/2 pain from knees, inability to fully swim but still exercises). Negative for fever, chills, fatigue and unexpected weight change.  Respiratory: Negative for shortness of breath.   Cardiovascular: Negative for chest pain.  Gastrointestinal: Negative for nausea, vomiting and diarrhea.  Musculoskeletal: Positive for arthralgias (chronic multiple joints, worsening left knee recently) and gait problem (chronic and uses cane, still ambulates regularly). Negative for joint swelling and myalgias.  Neurological: Negative for weakness and numbness.    Past Medical History  Diagnosis Date  . GERD (gastroesophageal reflux disease)     Papulous gastropathy EGD Oct. 2007  . Hypertension   . Low back pain     intermittent radiculopathy  . Osteoarthritis   . Pulmonary embolism     2/2 knee replacement  . Osteopenia     T score-1.1 R Fremur, - 0.4 L spine  . Hyperlipemia   . Polio 1942  . Urinary incontinence   . IBS (irritable bowel syndrome)        Objective:   Physical Exam Filed Vitals:   01/14/13 0856  BP: 153/83  Pulse: 60  Temp:    General: sitting in chair, NAD HEENT: PERRL, EOMI, no scleral icterus Cardiac:  RRR, no rubs, murmurs or gallops Pulm: clear to auscultation bilaterally, moving normal volumes of air Abd: soft, nontender, nondistended, BS present Ext: warm and well perfused, no pedal edema, bilateral knee scares well healed, no joint effusions of knees or other joints, FROM of knees bilaterally, uses cane for ambulation, negative vares/valgus/lachmans/drawer's/mcmurrays tests Neuro: alert and oriented X3, cranial nerves II-XII grossly intact     Assessment & Plan:  1. Knee pain, chronic left: no recent trauma, no signs of infection, no effusions or indications of gout or rheumatological flares or initial presentation of such processes, artificial knee joints about 6010+ years old maybe in need for replacement this was discussed with patient and she would prefer to see sports medicine before possibly pursuing orthopedic surgery again.  -cont voltaren gel and vicodin (needed some pills until next prescription on 01/28/13 therefore 20 pills given) -sports medicine referral  2. OA of multiple joints: chronic issue -see above for management  Pt was discussed with Dr. Josem KaufmannKlima

## 2013-01-14 NOTE — Patient Instructions (Signed)
For your knee pain  -keep taking the pain medicine -keep using the voltarn gel -we are making a sports medicine referral to help with your knee pain  Have a happy new year!

## 2013-01-14 NOTE — Assessment & Plan Note (Signed)
no recent trauma, no signs of infection, no effusions or indications of gout or rheumatological flares or initial presentation of such processes, artificial knee joints about 810+ years old maybe in need for replacement this was discussed with patient and she would prefer to see sports medicine before possibly pursuing orthopedic surgery again.  -cont voltaren gel and vicodin (needed some pills until next prescription on 01/28/13 therefore 20 pills given) -sports medicine referral

## 2013-01-15 NOTE — Progress Notes (Signed)
Case discussed with Dr. Sadek soon after the resident saw the patient.  We reviewed the resident's history and exam and pertinent patient test results.  I agree with the assessment, diagnosis and plan of care documented in the resident's note. 

## 2013-01-21 ENCOUNTER — Other Ambulatory Visit: Payer: Self-pay | Admitting: Internal Medicine

## 2013-01-22 ENCOUNTER — Other Ambulatory Visit: Payer: Self-pay | Admitting: *Deleted

## 2013-01-22 DIAGNOSIS — I10 Essential (primary) hypertension: Secondary | ICD-10-CM

## 2013-01-22 MED ORDER — HYDROCHLOROTHIAZIDE 25 MG PO TABS: 25.0000 mg | ORAL_TABLET | Freq: Every day | ORAL | Status: DC

## 2013-01-22 NOTE — Telephone Encounter (Signed)
Rx called in to pharmacy. 

## 2013-02-04 ENCOUNTER — Encounter: Payer: Self-pay | Admitting: Sports Medicine

## 2013-02-04 ENCOUNTER — Ambulatory Visit (INDEPENDENT_AMBULATORY_CARE_PROVIDER_SITE_OTHER): Payer: PRIVATE HEALTH INSURANCE | Admitting: Sports Medicine

## 2013-02-04 VITALS — BP 154/83 | Ht 64.0 in | Wt 200.0 lb

## 2013-02-04 DIAGNOSIS — M79605 Pain in left leg: Principal | ICD-10-CM

## 2013-02-04 DIAGNOSIS — M79604 Pain in right leg: Secondary | ICD-10-CM

## 2013-02-04 DIAGNOSIS — M79609 Pain in unspecified limb: Secondary | ICD-10-CM

## 2013-02-04 MED ORDER — METHYLPREDNISOLONE ACETATE 80 MG/ML IJ SUSP
80.0000 mg | Freq: Once | INTRAMUSCULAR | Status: AC
  Administered 2013-02-04: 80 mg via INTRAMUSCULAR

## 2013-02-04 MED ORDER — KETOROLAC TROMETHAMINE 60 MG/2ML IM SOLN
60.0000 mg | Freq: Once | INTRAMUSCULAR | Status: AC
Start: 2013-02-04 — End: 2013-02-04
  Administered 2013-02-04: 60 mg via INTRAMUSCULAR

## 2013-02-04 NOTE — Addendum Note (Signed)
Addended by: Jacki ConesMARTIN, Ramar Nobrega C on: 02/04/2013 09:50 AM   Modules accepted: Orders

## 2013-02-04 NOTE — Progress Notes (Signed)
   Subjective:    Patient ID: Jennifer Owen, female    DOB: 22-Sep-1931, 80 y.o.   MRN: [id number]  HPI chief complaint: Bilateral leg pain  Very pleasant 80 year old female comes in today complaining of chronic bilateral leg pain, left greater than right. No recent trauma. She complains of a diffuse ache that begins in the left side of her posterior hip and radiates down the left leg. Occasional numbness and tingling as well. Some weakness. She is status post bilateral total knee arthroplasty several years ago and although she is having some pain in her knees she describes a more diffuse pain in her legs. She has had injections in the past including SI joint injections which were helpful per her report. She also feels like she had some IM injections previously that were also helpful. No groin pain. She takes meloxicam 7.5 mg daily and Vicodin 5/325 3 times daily. She is on these medications chronically.  Past medical history, medications, and allergies are reviewed    Review of Systems     Objective:   Physical Exam Well-developed, well-nourished. No acute distress.  Patient has limited lumbar mobility secondary to pain. More pain with extension and flexion. Negative straight leg raise bilaterally. No focal neurological deficits of either lower extremity.  Examination of each knee shows range of motion from 0-100. No effusion. No tenderness to palpation.  Patient walks with the assistance of a cane.       Assessment & Plan:  Bilateral lower extremity pain, left greater than right, likely due to spinal stenosis  I do not feel like her symptoms are originating from her knees. I've elected to inject her with 80 mg of Depo-Medrol and 60 mg of Toradol. She will continue on her meloxicam and her hydrocodone. I will get x-rays of her lumbar spine including AP and lateral views. I will call her with those results once available. If symptoms persist, further diagnostic imaging may be  considered or potentially a return for a diagnostic/therapeutic SI joint injection. Followup with Dr. Jennette KettleNeal as scheduled.

## 2013-02-06 ENCOUNTER — Ambulatory Visit
Admission: RE | Admit: 2013-02-06 | Discharge: 2013-02-06 | Disposition: A | Discharge status: Home / Self Care | Payer: Medicare Other | Source: Ambulatory Visit | Attending: Sports Medicine | Admitting: Sports Medicine

## 2013-02-06 DIAGNOSIS — M79604 Pain in right leg: Secondary | ICD-10-CM

## 2013-02-06 DIAGNOSIS — M79605 Pain in left leg: Principal | ICD-10-CM

## 2013-02-15 ENCOUNTER — Telehealth: Payer: Self-pay | Admitting: Sports Medicine

## 2013-02-15 NOTE — Telephone Encounter (Signed)
I've made 3 separate attempts to contact the patient regarding the results of her lumbar spine x-rays. Each attempt has been unsuccessful.

## 2013-03-04 ENCOUNTER — Encounter (HOSPITAL_COMMUNITY): Payer: Self-pay | Admitting: Emergency Medicine

## 2013-03-04 ENCOUNTER — Emergency Department (HOSPITAL_COMMUNITY)
Admission: EM | Admit: 2013-03-04 | Discharge: 2013-03-04 | Disposition: A | Discharge status: Home / Self Care | Payer: PRIVATE HEALTH INSURANCE | Attending: Emergency Medicine | Admitting: Emergency Medicine

## 2013-03-04 DIAGNOSIS — Z79899 Other long term (current) drug therapy: Secondary | ICD-10-CM | POA: Insufficient documentation

## 2013-03-04 DIAGNOSIS — S99929A Unspecified injury of unspecified foot, initial encounter: Principal | ICD-10-CM

## 2013-03-04 DIAGNOSIS — E785 Hyperlipidemia, unspecified: Secondary | ICD-10-CM | POA: Insufficient documentation

## 2013-03-04 DIAGNOSIS — Z7982 Long term (current) use of aspirin: Secondary | ICD-10-CM | POA: Insufficient documentation

## 2013-03-04 DIAGNOSIS — R296 Repeated falls: Secondary | ICD-10-CM | POA: Diagnosis not present

## 2013-03-04 DIAGNOSIS — Z791 Long term (current) use of non-steroidal anti-inflammatories (NSAID): Secondary | ICD-10-CM | POA: Diagnosis not present

## 2013-03-04 DIAGNOSIS — S99919A Unspecified injury of unspecified ankle, initial encounter: Principal | ICD-10-CM

## 2013-03-04 DIAGNOSIS — W19XXXA Unspecified fall, initial encounter: Secondary | ICD-10-CM

## 2013-03-04 DIAGNOSIS — Z8612 Personal history of poliomyelitis: Secondary | ICD-10-CM | POA: Insufficient documentation

## 2013-03-04 DIAGNOSIS — Y9301 Activity, walking, marching and hiking: Secondary | ICD-10-CM | POA: Insufficient documentation

## 2013-03-04 DIAGNOSIS — Z87891 Personal history of nicotine dependence: Secondary | ICD-10-CM | POA: Insufficient documentation

## 2013-03-04 DIAGNOSIS — Y929 Unspecified place or not applicable: Secondary | ICD-10-CM | POA: Diagnosis not present

## 2013-03-04 DIAGNOSIS — M1990 Unspecified osteoarthritis, unspecified site: Secondary | ICD-10-CM | POA: Insufficient documentation

## 2013-03-04 DIAGNOSIS — Z96659 Presence of unspecified artificial knee joint: Secondary | ICD-10-CM | POA: Diagnosis not present

## 2013-03-04 DIAGNOSIS — K219 Gastro-esophageal reflux disease without esophagitis: Secondary | ICD-10-CM | POA: Insufficient documentation

## 2013-03-04 DIAGNOSIS — G8929 Other chronic pain: Secondary | ICD-10-CM | POA: Insufficient documentation

## 2013-03-04 DIAGNOSIS — I10 Essential (primary) hypertension: Secondary | ICD-10-CM | POA: Insufficient documentation

## 2013-03-04 DIAGNOSIS — S8990XA Unspecified injury of unspecified lower leg, initial encounter: Secondary | ICD-10-CM | POA: Diagnosis present

## 2013-03-04 DIAGNOSIS — Z86711 Personal history of pulmonary embolism: Secondary | ICD-10-CM | POA: Diagnosis not present

## 2013-03-04 MED ORDER — HYDROCODONE-ACETAMINOPHEN 5-325 MG PO TABS
1.0000 | ORAL_TABLET | Freq: Once | ORAL | Status: AC
  Administered 2013-03-04: 1 via ORAL
  Filled 2013-03-04: qty 1

## 2013-03-04 NOTE — ED Notes (Signed)
Pt reports when she got out the bed increased pain felt to right leg and pt fell caught her self with the towel rack and commode. Pt reports that she has been having pain in her leg for some time now. Pt uses a cane for ambulation.

## 2013-03-04 NOTE — ED Provider Notes (Signed)
CSN: [id number]     Arrival date & time 03/04/13  [id number] History   First MD Initiated Contact with Patient 03/04/13 [id number]     Chief Complaint  Patient presents with  . Fall  . Leg Pain   HPI Comments: 80 yo F hx of HTN, HLD, GERD, IBS, Osteoarthritis, Osteopenia, Chronic low back pain, presens s/p fall.  Pt states she went to the bathroom this morning around 130-200 AM.  She states she walked into the bathroom, and lost her balance.  She denies any presyncopal symptoms such as headache, vision change, CP, SOB, palpitations.  She fell down, and was able to catch herself on an adjacent towel rack, without hitting the floor.  She denies any head trauma, LOC, or extremity trauma.  She was ambulatory immediately after, and has been ambulatory since, with her cane which is her baseline.  She has had some increased pain in her right hip, right leg, right knee, and denies deformity, or swelling.  She has not taken anything today for pain.  She does have Voltaren cream, hydrocodone, and meloxicam for her chronic OA.  The history is provided by the patient.    Past Medical History  Diagnosis Date  . GERD (gastroesophageal reflux disease)     Papulous gastropathy EGD Oct. 2007  . Hypertension   . Low back pain     intermittent radiculopathy  . Osteoarthritis   . Pulmonary embolism     2/2 knee replacement  . Osteopenia     T score-1.1 R [place], - 0.4 L spine  . Hyperlipemia   . Polio 1942  . Urinary incontinence   . IBS (irritable bowel syndrome)    Past Surgical History  Procedure Laterality Date  . Joint replacement      total knee - left 1995, right 2004  . Abdominal hysterectomy  1985  . Oophorectomy  1985   Family History  Problem Relation Age of Onset  . Cancer Daughter     colon caner, <60   History  Substance Use Topics  . Smoking status: Former Games developermoker  . Smokeless tobacco: Not on file  . Alcohol Use: No   OB History   Grav Para Term Preterm Abortions TAB SAB Ect Mult Living                  Review of Systems  Constitutional: Negative for fever and chills.  Respiratory: Negative for cough and shortness of breath.   Cardiovascular: Negative for chest pain, palpitations and leg swelling.  Gastrointestinal: Negative for nausea, vomiting, abdominal pain, diarrhea and constipation.  Musculoskeletal: Positive for arthralgias. Negative for back pain, gait problem, joint swelling, myalgias, neck pain and neck stiffness.  Skin: Negative for rash.  Neurological: Negative for dizziness, seizures, weakness, light-headedness, numbness and headaches.  Hematological: Negative for adenopathy. Does not bruise/bleed easily.  All other systems reviewed and are negative.      Allergies  Tramadol hcl  Home Medications   Current Outpatient Rx  Name  Route  Sig  Dispense  Refill  . amitriptyline (ELAVIL) 25 MG tablet   Oral   Take 1 tablet (25 mg total) by mouth at bedtime.   30 tablet   0   . aspirin 81 MG chewable tablet   Oral   Chew 81 mg by mouth daily.           . diclofenac sodium (VOLTAREN) 1 % GEL   Topical   Apply 2 g topically 4 (four) times  daily.   100 g   1   . esomeprazole (NEXIUM) 40 MG capsule   Oral   Take 1 capsule (40 mg total) by mouth daily before breakfast.   30 capsule   11   . fish oil-omega-3 fatty acids 1000 MG capsule   Oral   Take 2 g by mouth daily.         . fluocinonide ointment (LIDEX) 0.05 %               . hydrochlorothiazide (HYDRODIURIL) 25 MG tablet   Oral   Take 1 tablet (25 mg total) by mouth daily.   30 tablet   11   . HYDROcodone-acetaminophen (NORCO/VICODIN) 5-325 MG per tablet      Take one tablet by mouth every 8 hours as needed for knee pain   90 tablet   0     Please do not fill until 11/28/12   . HYDROcodone-acetaminophen (NORCO/VICODIN) 5-325 MG per tablet      Take one tablet by mouth every 8 hours as needed for knee pain   90 tablet   0     Please do not fill until 01/28/13   .  lisinopril (PRINIVIL,ZESTRIL) 40 MG tablet   Oral   Take 1 tablet (40 mg total) by mouth daily.   30 tablet   11   . meloxicam (MOBIC) 7.5 MG tablet      TAKE 1 TABLET BY MOUTH DAILY.   30 tablet   3   . Multiple Vitamin (MULTIVITAMIN WITH MINERALS) TABS   Oral   Take 1 tablet by mouth daily.         Marland Kitchen NITROSTAT 0.4 MG SL tablet      PLACE 1 TABLET UNDER THE TONGUE EVERY 5 MINUTES AS NEEDED FOR CHEST PAIN.   25 tablet   11   . traZODone (DESYREL) 50 MG tablet      TAKE 1 TABLET BY MOUTH AT BEDTIME AS NEEDED FOR SLEEP.   30 tablet   11    BP 137/95  Pulse 65  Temp(Src) 97.9 F (36.6 C) (Oral)  Resp 18  Ht 5\' 2"  (1.575 m)  Wt 200 lb (90.719 kg)  BMI 36.57 kg/m2  SpO2 100% Physical Exam  Nursing note and vitals reviewed. Constitutional: She is oriented to person, place, and time. She appears well-developed and well-nourished.  HENT:  Head: Normocephalic and atraumatic.  Right Ear: External ear normal.  Left Ear: External ear normal.  Nose: Nose normal.  Mouth/Throat: Oropharynx is clear and moist.  Eyes: Conjunctivae and EOM are normal. Pupils are equal, round, and reactive to light.  Neck: Normal range of motion. Neck supple.  Cardiovascular: Normal rate, regular rhythm, normal heart sounds and intact distal pulses.   Pulmonary/Chest: Effort normal and breath sounds normal. No respiratory distress. She has no wheezes. She has no rales. She exhibits no tenderness.  Abdominal: Soft. Bowel sounds are normal. She exhibits no distension and no mass. There is no tenderness. There is no rebound and no guarding.  Musculoskeletal: Normal range of motion.  No extremity deformity, swelling, or erythema.  Normal passive and active ROM BUE, BLE.  TTP bilateral hips, bilateral knees.  Pt ambulated in room without difficulty, with her cane.  Neurological: She is alert and oriented to person, place, and time. She has normal reflexes.  Skin: Skin is warm and dry.  Well healed  scars bilateral knees s/p knee replacements.    ED Course  Procedures (including critical care time) Labs Review Labs Reviewed - No data to display Imaging Review No results found.  EKG Interpretation   None       MDM   Final diagnoses:  None   80 yo F hx of HTN, HLD, GERD, IBS, Osteoarthritis, Osteopenia, Chronic low back pain, presens s/p fall.  Filed Vitals:   03/04/13 0735  BP: 137/95  Pulse: 65  Temp: 97.9 F (36.6 C)  Resp: 18   Physical exam as above.  No signs of external trauma.  Pt has normal ROM of BUE, BLE.  She is able to ambulate well in room with cane, which is her baseline.    I do not feel pt requires imaging at this time.  Pt without frank trauma, able to ambulate well with can as is her baseline.  Will give norco in ED for osteoarthritis flare.  Pt to be d/c home in good condition.  Encouraged to continue supportive care.  Pt has Rx Mobic, Norco, and Voltaren, which she is encouraged to continue at home for her OA flare.  F/u with PCP in 1 week.  Return precautions given.  Pt understands and agrees with plan.  I have discussed pt's care plan with Dr. Rubin Payor.  Jon Gills, MD      Jon Gills, MD 03/04/13 1700

## 2013-03-04 NOTE — Discharge Instructions (Signed)
Arthritis, Nonspecific Arthritis is pain, redness, warmth, or puffiness (inflammation) of a joint. The joint may be stiff or hurt when you move it. One or more joints may be affected. There are many types of arthritis. Your doctor may not know what type you have right away. The most common cause of arthritis is wear and tear on the joint (osteoarthritis). HOME CARE   Only take medicine as told by your doctor.  Rest the joint as much as possible.  Raise (elevate) your joint if it is puffy.  Use crutches if the painful joint is in your leg.  Drink enough fluids to keep your pee (urine) clear or pale yellow.  Follow your doctor's diet instructions.  Use cold packs for very bad joint pain for 10 to 15 minutes every hour. Ask your doctor if it is okay for you to use hot packs.  Exercise as told by your doctor.  Take a warm shower if you have stiffness in the morning.  Move your sore joints throughout the day. GET HELP RIGHT AWAY IF:   You have a fever.  You have very bad joint pain, puffiness, or redness.  You have many joints that are painful and puffy.  You are not getting better with treatment.  You have very bad back pain or leg weakness.  You cannot control when you poop (bowel movement) or pee (urinate).  You do not feel better in 24 hours or are getting worse.  You are having side effects from your medicine. MAKE SURE YOU:   Understand these instructions.  Will watch your condition.  Will get help right away if you are not doing well or get worse. Document Released: 03/23/2009 Document Revised: 06/28/2011 Document Reviewed: 03/23/2009 [facility] Patient Information 2014 [place], [place].  Fall Prevention and Home Safety Falls cause injuries and can affect all age groups. It is possible to use preventive measures to significantly decrease the likelihood of falls. There are many simple measures which can make your home safer and prevent falls. OUTDOORS  Repair  cracks and edges of walkways and driveways.  Remove high doorway thresholds.  Trim shrubbery on the main path into your home.  Have good outside lighting.  Clear walkways of tools, rocks, debris, and clutter.  Check that handrails are not broken and are securely fastened. Both sides of steps should have handrails.  Have leaves, snow, and ice cleared regularly.  Use sand or salt on walkways during winter months.  In the garage, clean up grease or oil spills. BATHROOM  Install night lights.  Install grab bars by the toilet and in the tub and shower.  Use non-skid mats or decals in the tub or shower.  Place a plastic non-slip stool in the shower to sit on, if needed.  Keep floors dry and clean up all water on the floor immediately.  Remove soap buildup in the tub or shower on a regular basis.  Secure bath mats with non-slip, double-sided rug tape.  Remove throw rugs and tripping hazards from the floors. BEDROOMS  Install night lights.  Make sure a bedside light is easy to reach.  Do not use oversized bedding.  Keep a telephone by your bedside.  Have a firm chair with side arms to use for getting dressed.  Remove throw rugs and tripping hazards from the floor. KITCHEN  Keep handles on pots and pans turned toward the center of the stove. Use back burners when possible.  Clean up spills quickly and allow time for  drying.  Avoid walking on wet floors.  Avoid hot utensils and knives.  Position shelves so they are not too high or low.  Place commonly used objects within easy reach.  If necessary, use a sturdy step stool with a grab bar when reaching.  Keep electrical cables out of the way.  Do not use floor polish or wax that makes floors slippery. If you must use wax, use non-skid floor wax.  Remove throw rugs and tripping hazards from the floor. STAIRWAYS  Never leave objects on stairs.  Place handrails on both sides of stairways and use them. Fix any  loose handrails. Make sure handrails on both sides of the stairways are as long as the stairs.  Check carpeting to make sure it is firmly attached along stairs. Make repairs to worn or loose carpet promptly.  Avoid placing throw rugs at the top or bottom of stairways, or properly secure the rug with carpet tape to prevent slippage. Get rid of throw rugs, if possible.  Have an electrician put in a light switch at the top and bottom of the stairs. OTHER FALL PREVENTION TIPS  Wear low-heel or rubber-soled shoes that are supportive and fit well. Wear closed toe shoes.  When using a stepladder, make sure it is fully opened and both spreaders are firmly locked. Do not climb a closed stepladder.  Add color or contrast paint or tape to grab bars and handrails in your home. Place contrasting color strips on first and last steps.  Learn and use mobility aids as needed. Install an electrical emergency response system.  Turn on lights to avoid dark areas. Replace light bulbs that burn out immediately. Get light switches that glow.  Arrange furniture to create clear pathways. Keep furniture in the same place.  Firmly attach carpet with non-skid or double-sided tape.  Eliminate uneven floor surfaces.  Select a carpet pattern that does not visually hide the edge of steps.  Be aware of all pets. OTHER HOME SAFETY TIPS  Set the water temperature for 120 F (48.8 C).  Keep emergency numbers on or near the telephone.  Keep smoke detectors on every level of the home and near sleeping areas. Document Released: 12/17/2001 Document Revised: 06/28/2011 Document Reviewed: 03/18/2011 [facility] Patient Information 2014 [place].

## 2013-03-05 NOTE — ED Provider Notes (Signed)
Medical screening examination/treatment/procedure(s) were performed by non-physician practitioner and as supervising physician I was immediately available for consultation/collaboration.  EKG Interpretation   None      Patient with a leg pain. Acute on chronic. Does not appear to need x-ray. Will discharge home and followup.  Juliet RudeNathan R. Rubin PayorPickering, MD 03/05/13 [phone]

## 2013-03-06 ENCOUNTER — Ambulatory Visit (INDEPENDENT_AMBULATORY_CARE_PROVIDER_SITE_OTHER): Payer: PRIVATE HEALTH INSURANCE | Admitting: Internal Medicine

## 2013-03-06 ENCOUNTER — Encounter: Payer: Self-pay | Admitting: Internal Medicine

## 2013-03-06 VITALS — BP 137/73 | HR 54 | Temp 97.3°F | Ht 64.0 in | Wt 199.0 lb

## 2013-03-06 DIAGNOSIS — I10 Essential (primary) hypertension: Secondary | ICD-10-CM

## 2013-03-06 DIAGNOSIS — M159 Polyosteoarthritis, unspecified: Secondary | ICD-10-CM

## 2013-03-06 MED ORDER — HYDROCODONE-ACETAMINOPHEN 5-325 MG PO TABS: ORAL_TABLET | ORAL | Status: DC

## 2013-03-06 NOTE — Progress Notes (Signed)
Subjective:    Patient ID: Jennifer Owen, female    DOB: [birth year], 80 y.o.   MRN: [id number]  HPI [name] is a pleasant 80 year woman with hypertension, hyperlipidemia, osteoarthritis of multiple joints who presents for routine follow up.   OA - She has a history of chronic bilateral knee pain s/p bilateral total knee replacements 10-12 years ago. Over the past few months she also has noted a diffuse ache that begins in the left side of her posterior hip and radiates down the left leg. She was seen by Dr. Margaretha Owen in Sports Medicine for this last month. X-rays of her lumbar spine including AP and lateral views showed degenerative disc disease from L1-L5, no acute abnormality. Dr. Margaretha Owen elected to inject her with 80 mg of Depo-Medrol and 60 mg of Toradol. She tells me this helped reduce the pain for a couple of weeks, but it didn't make it completely go away. If symptoms persist, Dr. Margaretha Owen recommended further follow up for diagnostic imaging or potentially a diagnostic/therapeutic SI joint injection. Her pain is currently at baseline and she is able to ambulate just fine with her walker. She has stopped going to water aerobics secondary to her pain and the cold.  She has a medication contract with [place] for Vicodin 5/325 Q8H PRN #90, which she takes 0-3 times per day. She doesn't take it if she is not in pain. She is due for refills today, but she tells me she still has some at home and doesn't think she needs 3 months worth of refills. She also takes Meloxicam 7.5 mg about once every 3 days since December. Her last BMP was in 2013 and showed normal renal function. We will repeat this today.   Fall - She was seen in the ED on 2/23 for a fall after losing balance in the bathroom. No imaging was performed as there were no signs of trauma and she was ambulating at baseline. She tells me she had diarrhea that day, which is why she was in a rush to go to the bathroom. Her diarrhea has since  resolved.  HTN - BP 137/73 today.   GERD - GERD symptoms well controlled with Nexium taken in the am before breakfast. Denies abdominal pain, nausea, vomiting.   Current Outpatient Prescriptions on File Prior to Visit  Medication Sig Dispense Refill  . aspirin 81 MG chewable tablet Chew 81 mg by mouth daily.        . B Complex-Biotin-FA (B-50 COMPLEX PO) Take 1 tablet by mouth daily.      . diclofenac sodium (VOLTAREN) 1 % GEL Apply 2 g topically 4 (four) times daily.  100 g  1  . esomeprazole (NEXIUM) 40 MG capsule Take 1 capsule (40 mg total) by mouth daily before breakfast.  30 capsule  11  . fish oil-omega-3 fatty acids 1000 MG capsule Take 2 g by mouth daily.      . fluocinonide ointment (LIDEX) 0.05 % Apply 1 application topically daily. Left foot      . hydrochlorothiazide (HYDRODIURIL) 25 MG tablet Take 1 tablet (25 mg total) by mouth daily.  30 tablet  11  . HYDROcodone-acetaminophen (NORCO/VICODIN) 5-325 MG per tablet Take one tablet by mouth every 8 hours as needed for knee pain  90 tablet  0  . lisinopril (PRINIVIL,ZESTRIL) 40 MG tablet Take 1 tablet (40 mg total) by mouth daily.  30 tablet  11  . meloxicam (MOBIC) 7.5 MG tablet  Take 7.5 mg by mouth daily.      . nitroGLYCERIN (NITROSTAT) 0.4 MG SL tablet Place 0.4 mg under the tongue every 5 (five) minutes as needed for chest pain.      . [DISCONTINUED] simvastatin (ZOCOR) 20 MG tablet Take 20 mg by mouth every evening.       No current facility-administered medications on file prior to visit.    Review of Systems Constitutional: Negative for fever and chills.  Eyes: Negative for visual disturbance.  Respiratory: Negative for shortness of breath.  Cardiovascular: Negative for chest pain.  Gastrointestinal: Negative for abdominal pain.  Genitourinary: Negative for dysuria.  Musculoskeletal: Positive for arthralgias. Negative for joint swelling.  Skin: Negative for rash.  Neurological: Negative for dizziness and  headaches.      Objective:   Physical Exam Constitutional: She is oriented to person, place, and time. She appears well-developed and well-nourished.  HENT:  Head: Normocephalic and atraumatic.  Cardiovascular: Normal rate, regular rhythm and normal heart sounds. Exam reveals no gallop and no friction rub.  No murmur heard.  Pulmonary/Chest: Effort normal and breath sounds normal. No respiratory distress. She has no wheezes. She has no rales. She exhibits no tenderness.  Abdominal: Soft. She exhibits no distension. There is no tenderness.  Musculoskeletal: Normal range of motion. She exhibits no edema.  Aching pain in left hip and knee during ROM exam, with no specific exacerbating movement. Minimal crepitus. No effusions. Neurological: She is alert and oriented to person, place, and time. No cranial nerve deficit.  Skin: Skin is warm and dry.  Psychiatric: She has a normal mood and affect.      Assessment & Plan:   Please see problem based charting.

## 2013-03-06 NOTE — Assessment & Plan Note (Signed)
BP Readings from Last 3 Encounters:  03/06/13 137/73  03/04/13 148/59  02/04/13 154/83    Lab Results  Component Value Date   NA 140 10/18/2011   K 4.2 10/18/2011   CREATININE 1.05 10/18/2011    Assessment: Blood pressure control: controlled Progress toward BP goal:  at goal  Plan: Medications:  continue current medications

## 2013-03-06 NOTE — Patient Instructions (Signed)
Thanks for your visit. - I have reordered your Norco for one month supply. Please hold onto this prescription for a refill if needed. - I am doing blood work today to check your kidney function. I will call you only if this is abnormal. - Please call Dr. Gilmer Morraper's office at [phone] to schedule a followup appointment for further joint injections.  - I really do encourage you to continue doing water aerobics. I think this will help your pain in the long run. - Please call the clinic if your pain becomes much worse, or if you develop recurrent diarrhea, or if you have another fall.

## 2013-03-06 NOTE — Assessment & Plan Note (Addendum)
Pain is stable, but still present. She received benefit from joint injections with Dr. Margaretha Sheffieldraper. X-rays of her lumbar spine including AP and lateral views showed degenerative disc disease from L1-L5, no acute abnormality. - Encouraged the patient to followup with Dr. Margaretha Sheffieldraper for further injectable therapies and/or diagnostic imaging - Refilled Norco 5/325 Q8H PRN #90 for only one month, with a fill date of 03/06/2013, per the patient's request - She will call the clinic if she decides she needs more pain medicine in March - Continue meloxicam 7.5 mg when necessary - Continue Voltaren gel - Checking a BMP to evaluate renal function - Continue PPI to prevent ulceration and gastritis given NSAIDs - Encouraged the patient to continue water aerobics  ADDENDUM: Cr is 0.82 She has borderline hypercalcemia (10.7) , so would be a good idea to repeat a CMP (for Ca and albumin) at her next visit in the clinic.  BMET    Component Value Date/Time   NA 141 03/06/2013 1609   K 4.1 03/06/2013 1609   CL 102 03/06/2013 1609   CO2 29 03/06/2013 1609   GLUCOSE 89 03/06/2013 1609   BUN 14 03/06/2013 1609   CREATININE 0.82 03/06/2013 1609   CREATININE 1.09 10/06/2011 0634   CALCIUM 10.7* 03/06/2013 1609   CALCIUM 10.1 04/10/2007 1429   GFRNONAA 47* 10/06/2011 0634   GFRAA 55* 10/06/2011 [id number]

## 2013-03-07 LAB — BASIC METABOLIC PANEL WITH GFR
BUN: 14 mg/dL (ref 6–23)
CO2: 29 mEq/L (ref 19–32)
Calcium: 10.7 mg/dL — ABNORMAL HIGH (ref 8.4–10.5)
Chloride: 102 mEq/L (ref 96–112)
Creat: 0.82 mg/dL (ref 0.50–1.10)
GFR, EST NON AFRICAN AMERICAN: 68 mL/min
GFR, Est African American: 79 mL/min
Glucose, Bld: 89 mg/dL (ref 70–99)
POTASSIUM: 4.1 meq/L (ref 3.5–5.3)
Sodium: 141 mEq/L (ref 135–145)

## 2013-03-11 ENCOUNTER — Ambulatory Visit (INDEPENDENT_AMBULATORY_CARE_PROVIDER_SITE_OTHER): Payer: PRIVATE HEALTH INSURANCE | Admitting: Sports Medicine

## 2013-03-11 ENCOUNTER — Encounter: Payer: Self-pay | Admitting: Sports Medicine

## 2013-03-11 VITALS — BP 124/81 | Ht 62.0 in | Wt 199.0 lb

## 2013-03-11 DIAGNOSIS — M48061 Spinal stenosis, lumbar region without neurogenic claudication: Secondary | ICD-10-CM

## 2013-03-11 NOTE — Progress Notes (Signed)
Case discussed with Dr. Cater at the time of the visit.  We reviewed the resident's history and exam and pertinent patient test results.  I agree with the assessment, diagnosis, and plan of care documented in the resident's note.      

## 2013-03-11 NOTE — Progress Notes (Signed)
Patient ID: Jennifer Owen, female   DOB: December 06, 1931, 80 y.o.   MRN: [id number]  Patient comes in today to discuss x-ray findings of her lumbar spine. She has multilevel degenerative changes with underlying levoscoliosis. Nothing acute. She did well with the IM injections of Depo-Medrol and Toradol at the last office visit. This has particularly helped the pain in the right leg but she is still experiencing discomfort diffusely in the left leg. She is status post bilateral total knee arthroplasty and based on her x-ray findings, history, and prior physical exam findings I think that her symptoms are likely from spinal stenosis. We will get an MRI scan of her lumbar spine to evaluate this further. She will return to the office 2-3 days after that study. We will discuss the possibility of lumbar epidural steroid injections based on those findings. In the meantime, she has both hydrocodone and meloxicam to take as needed for pain.

## 2013-03-15 NOTE — Patient Instructions (Signed)
Prior authorization number for MRI lumbar spine is [id number]

## 2013-03-18 ENCOUNTER — Ambulatory Visit
Admission: RE | Admit: 2013-03-18 | Discharge: 2013-03-18 | Disposition: A | Discharge status: Home / Self Care | Payer: PRIVATE HEALTH INSURANCE | Source: Ambulatory Visit | Attending: Sports Medicine | Admitting: Sports Medicine

## 2013-03-18 DIAGNOSIS — M48061 Spinal stenosis, lumbar region without neurogenic claudication: Secondary | ICD-10-CM

## 2013-03-20 ENCOUNTER — Encounter: Payer: Self-pay | Admitting: Sports Medicine

## 2013-03-20 ENCOUNTER — Other Ambulatory Visit: Payer: Self-pay | Admitting: Sports Medicine

## 2013-03-20 ENCOUNTER — Ambulatory Visit (INDEPENDENT_AMBULATORY_CARE_PROVIDER_SITE_OTHER): Payer: PRIVATE HEALTH INSURANCE | Admitting: Sports Medicine

## 2013-03-20 VITALS — BP 133/84 | Ht 62.0 in | Wt 199.0 lb

## 2013-03-20 DIAGNOSIS — M549 Dorsalgia, unspecified: Secondary | ICD-10-CM

## 2013-03-20 DIAGNOSIS — M545 Low back pain, unspecified: Secondary | ICD-10-CM

## 2013-03-20 DIAGNOSIS — M48061 Spinal stenosis, lumbar region without neurogenic claudication: Secondary | ICD-10-CM

## 2013-03-20 NOTE — Progress Notes (Signed)
Patient ID: Jennifer Owen, female   DOB: Apr 08, 1931, 80 y.o.   MRN: [id number]  Patient comes in today to discuss MRI findings of her lumbar spine. She has diffuse lumbar spine spondylosis. At L5-S1, there is severe bilateral facet arthropathy resulting in severe bilateral foraminal stenosis. She continues to have predominantly left leg pain that involves the entire left leg down to the foot. My recommendation is for the patient to undergo a single lumbar ESI and then followup with me one week later for check on her progress. If she gets some initial improvement, then I would consider ordering a complete series. Given her advanced age I do not think that surgery is a good option for her.

## 2013-03-21 ENCOUNTER — Ambulatory Visit
Admission: RE | Admit: 2013-03-21 | Discharge: 2013-03-21 | Disposition: A | Discharge status: Home / Self Care | Payer: PRIVATE HEALTH INSURANCE | Source: Ambulatory Visit | Attending: Sports Medicine | Admitting: Sports Medicine

## 2013-03-21 VITALS — BP 165/85 | HR 48

## 2013-03-21 DIAGNOSIS — M25569 Pain in unspecified knee: Principal | ICD-10-CM

## 2013-03-21 DIAGNOSIS — G8929 Other chronic pain: Secondary | ICD-10-CM

## 2013-03-21 DIAGNOSIS — M549 Dorsalgia, unspecified: Secondary | ICD-10-CM

## 2013-03-21 MED ORDER — IOHEXOL 180 MG/ML  SOLN
1.0000 mL | Freq: Once | INTRAMUSCULAR | Status: AC | PRN
  Administered 2013-03-21: 1 mL via EPIDURAL

## 2013-03-21 MED ORDER — METHYLPREDNISOLONE ACETATE 40 MG/ML INJ SUSP (RADIOLOG
120.0000 mg | Freq: Once | INTRAMUSCULAR | Status: AC
  Administered 2013-03-21: 120 mg via EPIDURAL

## 2013-03-21 NOTE — Discharge Instructions (Signed)

## 2013-03-27 ENCOUNTER — Encounter: Payer: Self-pay | Admitting: Sports Medicine

## 2013-03-27 ENCOUNTER — Ambulatory Visit (INDEPENDENT_AMBULATORY_CARE_PROVIDER_SITE_OTHER): Payer: PRIVATE HEALTH INSURANCE | Admitting: Sports Medicine

## 2013-03-27 VITALS — BP 133/79 | HR 56 | Ht 62.0 in | Wt 199.0 lb

## 2013-03-27 DIAGNOSIS — M48061 Spinal stenosis, lumbar region without neurogenic claudication: Secondary | ICD-10-CM

## 2013-03-27 NOTE — Progress Notes (Signed)
Patient ID: Jennifer Owen, female   DOB: 27-Mar-1931, 80 y.o.   MRN: [id number]  Patient comes in today for followup. She underwent her first lumbar ESI last week. It was directed at the L4-L5 level on the left. As a result, she has noticed some improvement in her leg pain. She did wake up this morning with acute leg pain but that seems to be resolving. Otherwise, she's had a fairly good week since her initial injection. I recommended that she go ahead with a second lumbar ESI. If needed I could order a third as well. After the second injection, I've recommended that she wait a few weeks to see how her symptoms respond. Hopefully she will continue to get a positive result and if so, she can followup with me when necessary.

## 2013-03-29 ENCOUNTER — Other Ambulatory Visit: Payer: Self-pay | Admitting: Sports Medicine

## 2013-03-29 DIAGNOSIS — M48061 Spinal stenosis, lumbar region without neurogenic claudication: Secondary | ICD-10-CM

## 2013-04-04 ENCOUNTER — Ambulatory Visit
Admission: RE | Admit: 2013-04-04 | Discharge: 2013-04-04 | Disposition: A | Discharge status: Home / Self Care | Payer: PRIVATE HEALTH INSURANCE | Source: Ambulatory Visit | Attending: Sports Medicine | Admitting: Sports Medicine

## 2013-04-04 ENCOUNTER — Ambulatory Visit: Payer: Medicare Other | Admitting: Podiatrist

## 2013-04-04 VITALS — BP 164/66 | HR 48

## 2013-04-04 DIAGNOSIS — G8929 Other chronic pain: Secondary | ICD-10-CM

## 2013-04-04 DIAGNOSIS — M25569 Pain in unspecified knee: Principal | ICD-10-CM

## 2013-04-04 DIAGNOSIS — M48061 Spinal stenosis, lumbar region without neurogenic claudication: Secondary | ICD-10-CM

## 2013-04-04 MED ORDER — IOHEXOL 180 MG/ML  SOLN
1.0000 mL | Freq: Once | INTRAMUSCULAR | Status: AC | PRN
  Administered 2013-04-04: 1 mL via EPIDURAL

## 2013-04-04 MED ORDER — METHYLPREDNISOLONE ACETATE 40 MG/ML INJ SUSP (RADIOLOG
120.0000 mg | Freq: Once | INTRAMUSCULAR | Status: AC
  Administered 2013-04-04: 120 mg via EPIDURAL

## 2013-04-22 ENCOUNTER — Other Ambulatory Visit: Payer: Self-pay | Admitting: *Deleted

## 2013-04-22 DIAGNOSIS — M545 Low back pain, unspecified: Secondary | ICD-10-CM

## 2013-04-22 DIAGNOSIS — M25559 Pain in unspecified hip: Secondary | ICD-10-CM

## 2013-04-22 DIAGNOSIS — M159 Polyosteoarthritis, unspecified: Secondary | ICD-10-CM

## 2013-04-23 ENCOUNTER — Other Ambulatory Visit: Payer: Self-pay | Admitting: Sports Medicine

## 2013-04-23 DIAGNOSIS — M549 Dorsalgia, unspecified: Secondary | ICD-10-CM

## 2013-04-26 ENCOUNTER — Ambulatory Visit
Admission: RE | Admit: 2013-04-26 | Discharge: 2013-04-26 | Disposition: A | Discharge status: Home / Self Care | Payer: PRIVATE HEALTH INSURANCE | Source: Ambulatory Visit | Attending: Sports Medicine | Admitting: Sports Medicine

## 2013-04-26 ENCOUNTER — Other Ambulatory Visit: Payer: Self-pay | Admitting: *Deleted

## 2013-04-26 ENCOUNTER — Other Ambulatory Visit: Payer: Self-pay | Admitting: Sports Medicine

## 2013-04-26 VITALS — BP 185/61 | HR 50

## 2013-04-26 DIAGNOSIS — M549 Dorsalgia, unspecified: Secondary | ICD-10-CM

## 2013-04-26 DIAGNOSIS — G8929 Other chronic pain: Secondary | ICD-10-CM

## 2013-04-26 DIAGNOSIS — M533 Sacrococcygeal disorders, not elsewhere classified: Principal | ICD-10-CM

## 2013-04-26 MED ORDER — METHYLPREDNISOLONE ACETATE 40 MG/ML INJ SUSP (RADIOLOG
120.0000 mg | Freq: Once | INTRAMUSCULAR | Status: AC
  Administered 2013-04-26: 120 mg via EPIDURAL

## 2013-04-26 MED ORDER — IOHEXOL 180 MG/ML  SOLN
1.0000 mL | Freq: Once | INTRAMUSCULAR | Status: AC | PRN
  Administered 2013-04-26: 1 mL via EPIDURAL

## 2013-05-06 ENCOUNTER — Other Ambulatory Visit: Payer: Self-pay | Admitting: *Deleted

## 2013-05-06 DIAGNOSIS — M1990 Unspecified osteoarthritis, unspecified site: Secondary | ICD-10-CM

## 2013-05-06 MED ORDER — DICLOFENAC SODIUM 1 % TD GEL: 2.0000 g | Freq: Four times a day (QID) | TRANSDERMAL | Status: DC

## 2013-05-21 ENCOUNTER — Ambulatory Visit (INDEPENDENT_AMBULATORY_CARE_PROVIDER_SITE_OTHER): Payer: PRIVATE HEALTH INSURANCE | Admitting: Internal Medicine

## 2013-05-21 ENCOUNTER — Encounter: Payer: Self-pay | Admitting: Internal Medicine

## 2013-05-21 ENCOUNTER — Telehealth: Payer: Self-pay | Admitting: *Deleted

## 2013-05-21 VITALS — BP 153/83 | HR 79 | Temp 97.6°F | Wt 206.0 lb

## 2013-05-21 DIAGNOSIS — K625 Hemorrhage of anus and rectum: Secondary | ICD-10-CM

## 2013-05-21 LAB — CBC
HCT: 38.4 % (ref 36.0–46.0)
HEMOGLOBIN: 13.1 g/dL (ref 12.0–15.0)
MCH: 33.8 pg (ref 26.0–34.0)
MCHC: 34.1 g/dL (ref 30.0–36.0)
MCV: 99 fL (ref 78.0–100.0)
Platelets: 246 10*3/uL (ref 150–400)
RBC: 3.88 MIL/uL (ref 3.87–5.11)
RDW: 13.7 % (ref 11.5–15.5)
WBC: 7.1 10*3/uL (ref 4.0–10.5)

## 2013-05-21 MED ORDER — HYDROCORTISONE ACETATE 25 MG RE SUPP: 25.0000 mg | Freq: Two times a day (BID) | RECTAL | Status: DC

## 2013-05-21 MED ORDER — HYDROCORTISONE 2.5 % RE CREA: TOPICAL_CREAM | RECTAL | Status: DC

## 2013-05-21 NOTE — Telephone Encounter (Signed)
The pharmacy calls and states the suppositories ordered are very expensive and not covered under medicare the cream will be. Can you change this script? Please send electronically

## 2013-05-21 NOTE — Assessment & Plan Note (Addendum)
Patient noticed blood after wiping after BM.  She has 2 likely sources - she has a very small, minimally indurated abscess that has drops of blood on the medial aspect of her left butt cheek (she has had these before).  This abscess feels too small for I&D.  To note, she is afebrile in clinic.  She also has a large external hemorrhoid.    She has reports of melena, in the setting of epigastric abdominal pain and daily mobic.  She may have a component of gastritis, but I do not suspect a brisk upper GI bleed given stool color is back to brown and she is hemodynamically stable, and symptoms have been ongoing for about 1 week at the least.  FOBT Negative.  -Orthostatic vital signs - negative -CBC -wet compress/sits baths -Hydrocortisone suppositories BID x 5d -d/c mobic (she is taking Norco BID, may increase to TID as the script is written) -increase PPI to BID x 2 weeks, then back to daily -She has follow up with PCP later this month  -If anemic or symptoms recur, consider GI referral.

## 2013-05-21 NOTE — Progress Notes (Signed)
Subjective:   Patient ID: Jennifer Owen female   DOB: [birth year] 80 y.o.   MRN: [id number]  Chief Complaint  Patient presents with  . Melena    HPI: Jennifer Owen is a 80 y.o. woman with h/o chronic knee pain (h/o b/l knee replacement), severe lumbar spinal stenosis followed by Dr. Margaretha Sheffieldraper (2nd ESI on 07/26/13), HLD, HTN, GERD, PE and GERD who presents for   1. Noticed black stools when she got to [place] about a week ago.  A few lower abdominal pains - unable to describe (?crampy), not bad enough for hospital, but reports was 10/10, resolved on its own, no intervention.  Got home Sat PM from [place], and noticed BRBPR while wiping self.  Not constipated.  No recent diarrhea.  Black stools have faded back to brown.  Takes Mobic daily.  Takes a baby ASA. No other NSAIDs such as ibuprofen, narpoxen, goody powders.  No EtOH. No cigarettes in 30+ years.  Minimal blood yesterday.  No abd pain since Sat.   2. Reports boil on buttocks, noticed Friday in [place].  Has had this in the past, has treated with egg whites, and it dries up.       Review of Systems: Constitutional: Denies diaphoresis, appetite change and fatigue. +subjective fever + chills yesterday HEENT: Denies photophobia, eye pain, redness, hearing loss, ear pain, congestion, sore throat, rhinorrhea, sneezing, mouth sores, trouble swallowing, neck pain, neck stiffness and tinnitus.  Respiratory: Denies SOB, DOE, cough, chest tightness, and wheezing.  Cardiovascular: Denies chest pain, palpitations. +Leg swelling b/l  Gastrointestinal: Denies nausea, vomiting, diarrhea, constipation  Genitourinary: Denies dysuria, urgency, hematuria, flank pain and difficulty urinating. +polyuria, unchanged, occ urge incont Musculoskeletal: chronic back & knee pains  Skin: Denies pallor, rash  Neurological: Denies dizziness, seizures, syncope, weakness, lightheadedness, numbness and headaches.     Past Medical History  Diagnosis Date  . GERD  (gastroesophageal reflux disease)     Papulous gastropathy EGD Oct. 2007  . Hypertension   . Low back pain     intermittent radiculopathy  . Osteoarthritis   . Pulmonary embolism     2/2 knee replacement  . Osteopenia     T score-1.1 R [place], - 0.4 L spine  . Hyperlipemia   . Polio 1942  . Urinary incontinence   . IBS (irritable bowel syndrome)    Current Outpatient Prescriptions  Medication Sig Dispense Refill  . aspirin 81 MG chewable tablet Chew 81 mg by mouth daily.        . B Complex-Biotin-FA (B-50 COMPLEX PO) Take 1 tablet by mouth daily.      . diclofenac sodium (VOLTAREN) 1 % GEL Apply 2 g topically 4 (four) times daily.  100 g  1  . esomeprazole (NEXIUM) 40 MG capsule Take 1 capsule (40 mg total) by mouth daily before breakfast.  30 capsule  11  . fish oil-omega-3 fatty acids 1000 MG capsule Take 2 g by mouth daily.      . fluocinonide ointment (LIDEX) 0.05 % Apply 1 application topically daily. Left foot      . hydrochlorothiazide (HYDRODIURIL) 25 MG tablet Take 1 tablet (25 mg total) by mouth daily.  30 tablet  11  . HYDROcodone-acetaminophen (NORCO/VICODIN) 5-325 MG per tablet Take one tablet by mouth every 8 hours as needed for knee pain  90 tablet  0  . lisinopril (PRINIVIL,ZESTRIL) 40 MG tablet Take 1 tablet (40 mg total) by mouth daily.  30 tablet  11  .  meloxicam (MOBIC) 7.5 MG tablet Take 7.5 mg by mouth daily.      . nitroGLYCERIN (NITROSTAT) 0.4 MG SL tablet Place 0.4 mg under the tongue every 5 (five) minutes as needed for chest pain.      . traZODone (DESYREL) 50 MG tablet 50 mg at bedtime as needed.      . [DISCONTINUED] simvastatin (ZOCOR) 20 MG tablet Take 20 mg by mouth every evening.       No current facility-administered medications for this visit.   Family History  Problem Relation Age of Onset  . Cancer Daughter     colon caner, <60   History   Social History  . Marital Status: Legally Separated    Spouse Name: N/A    Number of Children: 7    . Years of Education: N/A   Occupational History  . 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 Smoking status: Former Games developermoker  . Smokeless tobacco: Not on file  . Alcohol Use: No  . Drug Use: No  . Sexual Activity: Not on file   Other Topics Concern  . Not on file   Social History Narrative   [facility]    Objective:  Physical Exam: Filed Vitals:   05/21/13 0852 05/21/13 0926 05/21/13 0932  BP: 161/81 133/78 153/83  Pulse: 61 69 79  Temp: 97.6 F (36.4 C)    TempSrc: Oral    SpO2: 100%     General: pleasant, appears as stated age HEENT: PERRL, EOMI, no scleral icterus, no conjunctival pallor Cardiac: RRR, no rubs, murmurs or gallops Pulm: clear to auscultation bilaterally, moving normal volumes of air Abd: soft, nondistended, BS hypoactive, epigastric and periumbilical tenderness without rebound, +large external hemorrhoid, left medial buttucks with 3-4 mm indurated bump with drops of blood Ext: warm and well perfused, trace b/l pretibial edema Neuro: alert and oriented X3, cranial nerves II-XII grossly intact  Assessment & Plan:  Case and care discussed with Dr. Meredith PelJoines.  Please see problem oriented charting for further details. Patient to return in on 06/07/13 for routine follow up with PCP.

## 2013-05-21 NOTE — Patient Instructions (Addendum)
General Instructions:  -Regarding the bump on your bottom - please do warm compresses 3 times a day for 15-20 min.  -You also have a hemorrhoid, make sure you are keeping soft, regular bowel movements.  I am also prescribing hydrocortisone suppositories to use twice daily for 5 days - you may get the over the counter kind but I also sent script to the pharmacy  -Please stop taking mobic (also called meloxicam) daily - I think this is irritating your stomach.  If you have worsening joint/back pain because you stopped this medication, increase your hydrocodone-acetaminophen to 3 times daily instead of twice daily.  -For the next two weeks, increase your acid pill (esomeprazole or nexium) to twice daily, then go back to once daily.  -I am going to check your blood level today  Thank you for bringing your medicines today. This helps us keep you safe from mistakes.  Please be sure to bring all of your medications with you to every visit.  Should you have any new or worsening symptoms, please be sure to call the clinic at [phone].   Progress Toward Treatment Goals:  Treatment Goal 05/21/2013  Blood pressure improved    Self Care Goals & Plans:  Self Care Goal 03/06/2013  Manage my medications take my medicines as prescribed; bring my medications to every visit  Monitor my health -  Eat healthy foods -  Be physically active find an activity I enjoy    No flowsheet data found.   Care Management & Community Referrals:  Referral 05/21/2013  Referrals made for care management support none needed     Hemorrhoids Hemorrhoids are swollen veins around the rectum or anus. There are two types of hemorrhoids:   Internal hemorrhoids. These occur in the veins just inside the rectum. They may poke through to the outside and become irritated and painful.  External hemorrhoids. These occur in the veins outside the anus and can be felt as a painful swelling or hard lump near the  anus. CAUSES  Pregnancy.   Obesity.   Constipation or diarrhea.   Straining to have a bowel movement.   Sitting for long periods on the toilet.  Heavy lifting or other activity that caused you to strain.  Anal intercourse. SYMPTOMS   Pain.   Anal itching or irritation.   Rectal bleeding.   Fecal leakage.   Anal swelling.   One or more lumps around the anus.  DIAGNOSIS  Your caregiver may be able to diagnose hemorrhoids by visual examination. Other examinations or tests that may be performed include:   Examination of the rectal area with a gloved hand (digital rectal exam).   Examination of anal canal using a small tube (scope).   A blood test if you have lost a significant amount of blood.  A test to look inside the colon (sigmoidoscopy or colonoscopy). TREATMENT Most hemorrhoids can be treated at home. However, if symptoms do not seem to be getting better or if you have a lot of rectal bleeding, your caregiver may perform a procedure to help make the hemorrhoids get smaller or remove them completely. Possible treatments include:   Placing a rubber band at the base of the hemorrhoid to cut off the circulation (rubber band ligation).   Injecting a chemical to shrink the hemorrhoid (sclerotherapy).   Using a tool to burn the hemorrhoid (infrared light therapy).   Surgically removing the hemorrhoid (hemorrhoidectomy).   Stapling the hemorrhoid to block blood flow to the tissue (  hemorrhoid stapling).  HOME CARE INSTRUCTIONS   Eat foods with fiber, such as whole grains, beans, nuts, fruits, and vegetables. Ask your doctor about taking products with added fiber in them (fibersupplements).  Increase fluid intake. Drink enough water and fluids to keep your urine clear or pale yellow.   Exercise regularly.   Go to the bathroom when you have the urge to have a bowel movement. Do not wait.   Avoid straining to have bowel movements.   Keep the  anal area dry and clean. Use wet toilet paper or moist towelettes after a bowel movement.   Medicated creams and suppositories may be used or applied as directed.   Only take over-the-counter or prescription medicines as directed by your caregiver.   Take warm sitz baths for 15 20 minutes, 3 4 times a day to ease pain and discomfort.   Place ice packs on the hemorrhoids if they are tender and swollen. Using ice packs between sitz baths may be helpful.   Put ice in a plastic bag.   Place a towel between your skin and the bag.   Leave the ice on for 15 20 minutes, 3 4 times a day.   Do not use a donut-shaped pillow or sit on the toilet for long periods. This increases blood pooling and pain.  SEEK MEDICAL CARE IF:  You have increasing pain and swelling that is not controlled by treatment or medicine.  You have uncontrolled bleeding.  You have difficulty or you are unable to have a bowel movement.  You have pain or inflammation outside the area of the hemorrhoids. MAKE SURE YOU:  Understand these instructions.  Will watch your condition.  Will get help right away if you are not doing well or get worse. Document Released: 12/25/1999 Document Revised: 12/14/2011 Document Reviewed: 11/01/2011 [facility] Patient Information 2014 [employer].

## 2013-05-22 NOTE — Progress Notes (Signed)
Case discussed with Dr. Sharda soon after the resident saw the patient.  We reviewed the resident's history and exam and pertinent patient test results.  I agree with the assessment, diagnosis, and plan of care documented in the resident's note. 

## 2013-05-30 ENCOUNTER — Ambulatory Visit (INDEPENDENT_AMBULATORY_CARE_PROVIDER_SITE_OTHER): Payer: Medicare Other | Admitting: Podiatrist

## 2013-05-30 ENCOUNTER — Encounter: Payer: Self-pay | Admitting: Podiatrist

## 2013-05-30 VITALS — BP 173/69 | HR 67 | Resp 17 | Ht 62.0 in | Wt 206.0 lb

## 2013-05-30 DIAGNOSIS — B351 Tinea unguium: Secondary | ICD-10-CM

## 2013-05-30 DIAGNOSIS — M79609 Pain in unspecified limb: Secondary | ICD-10-CM

## 2013-06-01 NOTE — Progress Notes (Signed)
HPI:  Patient presents today for follow up of foot and nail care. Denies any new complaints today.  Objective:  Patients chart is reviewed.  Neurovascular status unchanged.  Patients nails are thickened, discolored, distrophic, friable and brittle with yellow-brown discoloration. Patient subjectively relates they are painful with shoes and with ambulation of bilateral feet.  Assessment:  Symptomatic onychomycosis  Plan:  Discussed treatment options and alternatives.  The symptomatic toenails were debrided through manual an mechanical means without complication.  Return appointment recommended at routine intervals of 3 months       

## 2013-06-07 ENCOUNTER — Encounter: Payer: Self-pay | Admitting: Internal Medicine

## 2013-06-07 ENCOUNTER — Ambulatory Visit (INDEPENDENT_AMBULATORY_CARE_PROVIDER_SITE_OTHER): Payer: PRIVATE HEALTH INSURANCE | Admitting: Internal Medicine

## 2013-06-07 VITALS — BP 128/74 | HR 57 | Temp 98.4°F | Ht 64.0 in | Wt 201.1 lb

## 2013-06-07 DIAGNOSIS — I10 Essential (primary) hypertension: Secondary | ICD-10-CM

## 2013-06-07 DIAGNOSIS — R300 Dysuria: Secondary | ICD-10-CM

## 2013-06-07 DIAGNOSIS — Z634 Disappearance and death of family member: Secondary | ICD-10-CM | POA: Insufficient documentation

## 2013-06-07 DIAGNOSIS — M159 Polyosteoarthritis, unspecified: Secondary | ICD-10-CM

## 2013-06-07 DIAGNOSIS — K625 Hemorrhage of anus and rectum: Secondary | ICD-10-CM

## 2013-06-07 MED ORDER — HYDROCODONE-ACETAMINOPHEN 5-325 MG PO TABS: ORAL_TABLET | ORAL | Status: DC

## 2013-06-07 NOTE — Assessment & Plan Note (Signed)
Not surprisingly, her osteoarthritis pain is back after discontinuing all pain medications. Norco was discontinued at our last continuity clinic visit because the patient did not think she needed it any more, and the Mobic was discontinued earlier this month due to an episode of bleeding per rectum. No red flags including no bowel or bladder incontinence and no numbness or weakness. - Reorder Norco 5/325 q. 8 hours when necessary #90 for 3 months - Encouraged the patient to followup again with Dr. Margaretha Sheffield, I provided her with his office phone number - She will call the clinic if she develops any sudden weakness, numbness, incontinence

## 2013-06-07 NOTE — Assessment & Plan Note (Addendum)
On review of systems, patient endorsed dysuria, urinary frequency, and some mild suprapubic abdominal pain. - Checking UA  ADDENDUM: UA looks OK, negative for nitrites, LE.

## 2013-06-07 NOTE — Progress Notes (Signed)
Subjective:    Patient ID: Jennifer Owen, female    DOB: 1931-07-29, 80 y.o.   MRN: [id number]  HPI  Jennifer Owen is a pleasant 72 year woman with hypertension, hyperlipidemia, osteoarthritis of multiple joints who presents for routine follow up. She is a little bit emotional because she tells me her daughter died of cancer recently. She was a drug user. She has a support system in her other children and in her church. She denies consistent low mood, though she does feel lonely when she is by herself. Denies thoughts of hurting herself.  OA - Patient still reports pain in her bilateral knees and her right hip. It is 10/10 today. This same pain was previously well controlled with Norco and Mobic, but the Norco was discontinued at our last continuity clinic visit because the patient did not think she needed it any more, and the Mobic was discontinued earlier this month due to an episode of bleeding per rectum (see below). Patient follows with Dr. Margaretha Sheffield in sports medicine. She has undergone several lumbar ESI's but these are really only temporizing for her pain. She denies bowel or bladder dysfunction.  Melena/BRBPR - Patient was seen in clinic earlier this month for melena/BRBPR felt secondary to large external hemorrhoid. FOBT was checked and negative. The patient was not anemic. Mobic was discontinued. She denies any further episodes of blood per rectum.   Current Outpatient Prescriptions on File Prior to Visit  Medication Sig Dispense Refill  . aspirin 81 MG chewable tablet Chew 81 mg by mouth daily.        . B Complex-Biotin-FA (B-50 COMPLEX PO) Take 1 tablet by mouth daily.      . diclofenac sodium (VOLTAREN) 1 % GEL Apply 2 g topically 4 (four) times daily.  100 g  1  . esomeprazole (NEXIUM) 40 MG capsule Take 1 capsule (40 mg total) by mouth daily before breakfast.  30 capsule  11  . fish oil-omega-3 fatty acids 1000 MG capsule Take 2 g by mouth daily.      . fluocinonide  ointment (LIDEX) 0.05 % Apply 1 application topically daily. Left foot      . hydrochlorothiazide (HYDRODIURIL) 25 MG tablet Take 1 tablet (25 mg total) by mouth daily.  30 tablet  11  . HYDROcodone-acetaminophen (NORCO/VICODIN) 5-325 MG per tablet Take one tablet by mouth every 8 hours as needed for knee pain  90 tablet  0  . hydrocortisone (ANUSOL-HC) 2.5 % rectal cream Apply rectally 2 times daily  30 g  1  . lisinopril (PRINIVIL,ZESTRIL) 40 MG tablet Take 1 tablet (40 mg total) by mouth daily.  30 tablet  11  . nitroGLYCERIN (NITROSTAT) 0.4 MG SL tablet Place 0.4 mg under the tongue every 5 (five) minutes as needed for chest pain.      . traZODone (DESYREL) 50 MG tablet 50 mg at bedtime as needed.      . [DISCONTINUED] simvastatin (ZOCOR) 20 MG tablet Take 20 mg by mouth every evening.        Review of Systems  Constitutional: Negative for fever.  Respiratory: Negative for shortness of breath.   Cardiovascular: Negative for chest pain.  Gastrointestinal: Positive for abdominal pain (Mild suprapubic).  Genitourinary: Positive for dysuria and frequency. Negative for hematuria.  Musculoskeletal: Positive for arthralgias, back pain and myalgias.  Neurological: Negative for weakness and numbness.       Objective:   Physical Exam Constitutional: She is oriented to person,  place, and time. She appears well-developed and well-nourished.  HENT:  Head: Normocephalic and atraumatic.  Cardiovascular: Normal rate, regular rhythm and normal heart sounds. Exam reveals no gallop and no friction rub.  No murmur heard.  Pulmonary/Chest: Effort normal and breath sounds normal. No respiratory distress. She has no wheezes. She has no rales. She exhibits no tenderness.  Abdominal: Soft. She exhibits no distension. There is mild suprapubic tenderness to deep palpation.  Musculoskeletal: Normal range of motion. She exhibits no edema.  Aching pain in right hip and knee during ROM exam, with no specific  exacerbating movement. Minimal crepitus. No effusions. Neurological: She is alert and oriented to person, place, and time. No cranial nerve deficit.  Skin: Skin is warm and dry.  Psychiatric: Tearful when talking about daughter.   Filed Vitals:   06/07/13 1407  BP: 128/74  Pulse: 57  Temp: 98.4 F (36.9 C)       Assessment & Plan:   Please see problem based charting.

## 2013-06-07 NOTE — Patient Instructions (Signed)
Thank you for your visit. - I have refilled your hydrocodone for 3 months. We cannot replace these prescriptions, so please hold onto them. - Your arthritis doctor is Dr. Margaretha Sheffield, his phone number is: [phone]. Please schedule followup appointment as soon as possible. - We are doing some routine blood work today. I will call you if it is abnormal. - I am also checking your urine for urinary tract infection. Please provide a sample before you leave. - Please return for followup in 3 months.

## 2013-06-07 NOTE — Assessment & Plan Note (Signed)
BP Readings from Last 3 Encounters:  06/07/13 128/74  05/30/13 173/69  05/21/13 153/83    Lab Results  Component Value Date   NA 141 03/06/2013   K 4.1 03/06/2013   CREATININE 0.82 03/06/2013    Assessment: Blood pressure control: controlled Progress toward BP goal:  at goal Comments: Good control despite patient being in acute pain.  Plan: Medications:  continue current medications - HCTZ 25 mg daily, lisinopril 40 mg daily

## 2013-06-07 NOTE — Assessment & Plan Note (Signed)
No further issues, I have avoided prescribing NSAIDs this visit.

## 2013-06-07 NOTE — Assessment & Plan Note (Signed)
Patient lost her daughter to cancer recently. She is understandably tearful but does not endorse depression, suicidal ideation. She has a good support system in her family and her church. - Continue to monitor

## 2013-06-07 NOTE — Assessment & Plan Note (Addendum)
Calcium  Date Value Ref Range Status  03/06/2013 10.7* 8.4 - 10.5 mg/dL Final  94/05/386 82.8  8.4 - 10.5 mg/dL Final  0/03/4915 91.5  8.4 - 10.5 mg/dL Final  0/05/6977 48.0  8.4 - 10.5 mg/dL Final  1/65/5374 82.7  8.4 - 10.5 mg/dL Final    Noted on BMP last visit, checking CMP today. Trend above.  ADDENDUM: Resolved, Ca 9.9.  CMP     Component Value Date/Time   NA 134* 06/07/2013 1438   K 4.2 06/07/2013 1438   CL 100 06/07/2013 1438   CO2 24 06/07/2013 1438   GLUCOSE 88 06/07/2013 1438   BUN 25* 06/07/2013 1438   CREATININE 1.00 06/07/2013 1438   CREATININE 1.09 10/06/2011 0634   CALCIUM 9.9 06/07/2013 1438   CALCIUM 10.1 04/10/2007 1429   PROT 7.0 06/07/2013 1438   ALBUMIN 4.2 06/07/2013 1438   AST 20 06/07/2013 1438   ALT 17 06/07/2013 1438   ALKPHOS 73 06/07/2013 1438   BILITOT 0.3 06/07/2013 1438   GFRNONAA 53* 06/07/2013 1438   GFRNONAA 47* 10/06/2011 0634   GFRAA 61 06/07/2013 1438   GFRAA 55* 10/06/2011 0786

## 2013-06-08 LAB — COMPLETE METABOLIC PANEL WITH GFR
ALBUMIN: 4.2 g/dL (ref 3.5–5.2)
ALT: 17 U/L (ref 0–35)
AST: 20 U/L (ref 0–37)
Alkaline Phosphatase: 73 U/L (ref 39–117)
BUN: 25 mg/dL — ABNORMAL HIGH (ref 6–23)
CO2: 24 mEq/L (ref 19–32)
Calcium: 9.9 mg/dL (ref 8.4–10.5)
Chloride: 100 mEq/L (ref 96–112)
Creat: 1 mg/dL (ref 0.50–1.10)
GFR, EST NON AFRICAN AMERICAN: 53 mL/min — AB
GFR, Est African American: 61 mL/min
GLUCOSE: 88 mg/dL (ref 70–99)
POTASSIUM: 4.2 meq/L (ref 3.5–5.3)
Sodium: 134 mEq/L — ABNORMAL LOW (ref 135–145)
TOTAL PROTEIN: 7 g/dL (ref 6.0–8.3)
Total Bilirubin: 0.3 mg/dL (ref 0.2–1.2)

## 2013-06-08 LAB — URINALYSIS, ROUTINE W REFLEX MICROSCOPIC
GLUCOSE, UA: NEGATIVE mg/dL
HGB URINE DIPSTICK: NEGATIVE
KETONES UR: NEGATIVE mg/dL
Leukocytes, UA: NEGATIVE
Nitrite: NEGATIVE
PH: 5 (ref 5.0–8.0)
Protein, ur: NEGATIVE mg/dL
Specific Gravity, Urine: 1.023 (ref 1.005–1.030)
Urobilinogen, UA: 0.2 mg/dL (ref 0.0–1.0)

## 2013-06-10 NOTE — Progress Notes (Signed)
INTERNAL MEDICINE TEACHING ATTENDING ADDENDUM - Seanne Chirico, MD: I reviewed and discussed at the time of visit with the resident Dr. Cater, the patient's medical history, physical examination, diagnosis and results of tests and treatment and I agree with the patient's care as documented. 

## 2013-06-12 ENCOUNTER — Telehealth: Payer: Self-pay | Admitting: *Deleted

## 2013-06-12 NOTE — Telephone Encounter (Signed)
Pt called - trying to make appt with Dr Gilmer Mor office. Called [facility] - pt can call and make appt. Needs no referral. Pt aware. Stanton Kidney Coralee Edberg RN 06/12/13 2:30PM

## 2013-06-19 ENCOUNTER — Telehealth: Payer: Self-pay | Admitting: Internal Medicine

## 2013-06-26 ENCOUNTER — Ambulatory Visit (INDEPENDENT_AMBULATORY_CARE_PROVIDER_SITE_OTHER): Payer: PRIVATE HEALTH INSURANCE | Admitting: Sports Medicine

## 2013-06-26 ENCOUNTER — Encounter: Payer: Self-pay | Admitting: Sports Medicine

## 2013-06-26 VITALS — BP 151/83 | Ht 66.0 in | Wt 210.0 lb

## 2013-06-26 DIAGNOSIS — G8929 Other chronic pain: Secondary | ICD-10-CM

## 2013-06-26 DIAGNOSIS — M545 Low back pain, unspecified: Secondary | ICD-10-CM

## 2013-06-26 DIAGNOSIS — M533 Sacrococcygeal disorders, not elsewhere classified: Secondary | ICD-10-CM

## 2013-06-26 NOTE — Progress Notes (Signed)
   Subjective:    Patient ID: Jennifer Owen, female    DOB: January 06, 1932, 80 y.o.   MRN: [id number]  HPI  Patient comes in today for followup on low back pain. Overall, her pain has improved. She initially had 2 lumbar epidural steroid injections which really did not provide her with much pain relief. [facility] they instead performed bilateral SI joint injections and this has resulted in significant symptom relief. She is feeling well enough that she would like to return to her water exercises.    Review of Systems     Objective:   Physical Exam Well-developed, no acute distress.  Patient still demonstrates limited lumbar mobility but minimal pain. No tenderness to palpation along the lumbar midline or paraspinal musculature. No tenderness to palpation over the SI joints.No focal neurological deficits of either lower extremity grossly.       Assessment & Plan:  Improved low back pain secondary to lumbar degenerative disc disease and SI joint arthropathy  It is obvious that her pain generator is the SI joint and not her lumbar degenerative disc disease. I think she is okay to return to her water exercises. If her pain returns at a later date I would be happy [facility]. Followup when necessary.

## 2013-07-02 ENCOUNTER — Other Ambulatory Visit: Payer: Self-pay | Admitting: Internal Medicine

## 2013-07-02 DIAGNOSIS — Z1231 Encounter for screening mammogram for malignant neoplasm of breast: Secondary | ICD-10-CM

## 2013-07-03 NOTE — Telephone Encounter (Signed)
Called to schedule IAWV. When pt returns call please schedule appt for 60 minutes.  ° °

## 2013-07-10 ENCOUNTER — Ambulatory Visit (INDEPENDENT_AMBULATORY_CARE_PROVIDER_SITE_OTHER): Payer: PRIVATE HEALTH INSURANCE | Admitting: Internal Medicine

## 2013-07-10 ENCOUNTER — Encounter: Payer: Self-pay | Admitting: Internal Medicine

## 2013-07-10 VITALS — BP 162/80 | HR 78 | Temp 97.5°F | Ht 64.0 in | Wt 205.1 lb

## 2013-07-10 DIAGNOSIS — M25551 Pain in right hip: Secondary | ICD-10-CM

## 2013-07-10 DIAGNOSIS — IMO0001 Reserved for inherently not codable concepts without codable children: Secondary | ICD-10-CM

## 2013-07-10 DIAGNOSIS — M7918 Myalgia, other site: Secondary | ICD-10-CM

## 2013-07-10 DIAGNOSIS — M25559 Pain in unspecified hip: Secondary | ICD-10-CM

## 2013-07-10 NOTE — Patient Instructions (Addendum)
General Instructions: Please get your Norco filled and take up to three times a day as needed  Come back 1-2 weeks, sooner if need for blood pressure and pain check up   Treatment Goals:  Goals (1 Years of Data) as of 07/10/13   None      Progress Toward Treatment Goals:  Treatment Goal 07/10/2013  Blood pressure deteriorated    Self Care Goals & Plans:  Self Care Goal 07/10/2013  Manage my medications take my medicines as prescribed; bring my medications to every visit; refill my medications on time  Monitor my health keep track of my blood pressure  Eat healthy foods drink diet soda or water instead of juice or soda; eat more vegetables; eat foods that are low in salt; eat baked foods instead of fried foods; eat fruit for snacks and desserts; eat smaller portions  Be physically active take a walk every day  Meeting treatment goals maintain the current self-care plan    No flowsheet data found.   Care Management & Community Referrals:  Referral 07/10/2013  Referrals made for care management support none needed  Referrals made to community resources none        Degenerative Disk Disease Degenerative disk disease is a condition caused by the changes that occur in the cushions of the backbone (spinal disks) as you grow older. Spinal disks are soft and compressible disks located between the bones of the spine (vertebrae). They act like shock absorbers. Degenerative disk disease can affect the whole spine. However, the neck and lower back are most commonly affected. Many changes can occur in the spinal disks with aging, such as:  The spinal disks may dry and shrink.  Small tears may occur in the tough, outer covering of the disk (annulus).  The disk space may become smaller due to loss of water.  Abnormal growths in the bone (spurs) may occur. This can put pressure on the nerve roots exiting the spinal canal, causing pain.  The spinal canal may become narrowed. CAUSES   Degenerative disk disease is a condition caused by the changes that occur in the spinal disks with aging. The exact cause is not known, but there is a genetic basis for many patients. Degenerative changes can occur due to loss of fluid in the disk. This makes the disk thinner and reduces the space between the backbones. Small cracks can develop in the outer layer of the disk. This can lead to the breakdown of the disk. You are more likely to get degenerative disk disease if you are overweight. Smoking cigarettes and doing heavy work such as weightlifting can also increase your risk of this condition. Degenerative changes can start after a sudden injury. Growth of bone spurs can compress the nerve roots and cause pain.  SYMPTOMS  The symptoms vary from person to person. Some people may have no pain, while others have severe pain. The pain may be so severe that it can limit your activities. The location of the pain depends on the part of your backbone that is affected. You will have neck or arm pain if a disk in the neck area is affected. You will have pain in your back, buttocks, or legs if a disk in the lower back is affected. The pain becomes worse while bending, reaching up, or with twisting movements. The pain may start gradually and then get worse as time passes. It may also start after a major or minor injury. You may feel numbness or tingling  in the arms or legs.  DIAGNOSIS  Your caregiver will ask you about your symptoms and about activities or habits that may cause the pain. He or she may also ask about any injuries, diseases, or treatments you have had earlier. Your caregiver will examine you to check for the range of movement that is possible in the affected area, to check for strength in your extremities, and to check for sensation in the areas of the arms and legs supplied by different nerve roots. An X-ray of the spine may be taken. Your caregiver may suggest other imaging tests, such as magnetic  resonance imaging (MRI), if needed.  TREATMENT  Treatment includes rest, modifying your activities, and applying ice and heat. Your caregiver may prescribe medicines to reduce your pain and may ask you to do some exercises to strengthen your back. In some cases, you may need surgery. You and your caregiver will decide on the treatment that is best for you. HOME CARE INSTRUCTIONS   Follow proper lifting and walking techniques as advised by your caregiver.  Maintain good posture.  Exercise regularly as advised.  Perform relaxation exercises.  Change your sitting, standing, and sleeping habits as advised. Change positions frequently.  Lose weight as advised.  Stop smoking if you smoke.  Wear supportive footwear. SEEK MEDICAL CARE IF:  Your pain does not go away within 1 to 4 weeks. SEEK IMMEDIATE MEDICAL CARE IF:   Your pain is severe.  You notice weakness in your arms, hands, or legs.  You begin to lose control of your bladder or bowel movements. MAKE SURE YOU:   Understand these instructions.  Will watch your condition.  Will get help right away if you are not doing well or get worse. Document Released: 10/24/2006 Document Revised: 03/21/2011 Document Reviewed: 10/24/2006 [facility] Patient Information 2015 [place], [place]. This information is not intended to replace advice given to you by your health care provider. Make sure you discuss any questions you have with your health care provider.  Osteoarthritis Osteoarthritis is a disease that causes soreness and swelling (inflammation) of a joint. It occurs when the cartilage at the affected joint wears down. Cartilage acts as a cushion, covering the ends of bones where they meet to form a joint. Osteoarthritis is the most common form of arthritis. It often occurs in older people. The joints affected most often by this condition include those in the:  Ends of the fingers.  Thumbs.  Neck.  Lower  back.  Knees.  Hips. CAUSES  Over time, the cartilage that covers the ends of bones begins to wear away. This causes bone to rub on bone, producing pain and stiffness in the affected joints.  RISK FACTORS Certain factors can increase your chances of having osteoarthritis, including:  Older age.  Excessive body weight.  Overuse of joints. SIGNS AND SYMPTOMS   Pain, swelling, and stiffness in the joint.  Over time, the joint may lose its normal shape.  Small deposits of bone (osteophytes) may grow on the edges of the joint.  Bits of bone or cartilage can break off and float inside the joint space. This may cause more pain and damage. DIAGNOSIS  Your health care provider will do a physical exam and ask about your symptoms. Various tests may be ordered, such as:  X-rays of the affected joint.  An MRI scan.  Blood tests to rule out other types of arthritis.  Joint fluid tests. This involves using a needle to draw fluid from the  joint and examining the fluid under a microscope. TREATMENT  Goals of treatment are to control pain and improve joint function. Treatment plans may include:  A prescribed exercise program that allows for rest and joint relief.  A weight control plan.  Pain relief techniques, such as:  Properly applied heat and cold.  Electric pulses delivered to nerve endings under the skin (transcutaneous electrical nerve stimulation, TENS).  Massage.  Certain nutritional supplements.  Medicines to control pain, such as:  Acetaminophen.  Nonsteroidal anti-inflammatory drugs (NSAIDs), such as naproxen.  Narcotic or central-acting agents, such as tramadol.  Corticosteroids. These can be given orally or as an injection.  Surgery to reposition the bones and relieve pain (osteotomy) or to remove loose pieces of bone and cartilage. Joint replacement may be needed in advanced states of osteoarthritis. HOME CARE INSTRUCTIONS   Only take over-the-counter or  prescription medicines as directed by your health care provider. Take all medicines exactly as instructed.  Maintain a healthy weight. Follow your health care provider's instructions for weight control. This may include dietary instructions.  Exercise as directed. Your health care provider can recommend specific types of exercise. These may include:  Strengthening exercises--These are done to strengthen the muscles that support joints affected by arthritis. They can be performed with weights or with exercise bands to add resistance.  Aerobic activities--These are exercises, such as brisk walking or low-impact aerobics, that get your heart pumping.  Range-of-motion activities--These keep your joints limber.  Balance and agility exercises--These help you maintain daily living skills.  Rest your affected joints as directed by your health care provider.  Follow up with your health care provider as directed. SEEK MEDICAL CARE IF:   Your skin turns red.  You develop a rash in addition to your joint pain.  You have worsening joint pain. SEEK IMMEDIATE MEDICAL CARE IF:  You have a significant loss of weight or appetite.  You have a fever along with joint or muscle aches.  You have night sweats. FOR MORE INFORMATION  National Institute of Arthritis and Musculoskeletal and Skin Diseases: www.niams.http://www.myers.net/nih.gov [employer]ging: https://walker.com/www.nia.nih.gov American College of Rheumatology: www.rheumatology.org Document Released: 12/27/2004 Document Revised: 10/17/2012 Document Reviewed: 09/03/2012 [facility] Patient Information 2015 [place]. This information is not intended to replace advice given to you by your health care provider. Make sure you discuss any questions you have with your health care provider.

## 2013-07-11 ENCOUNTER — Encounter: Payer: Self-pay | Admitting: Internal Medicine

## 2013-07-11 DIAGNOSIS — M7918 Myalgia, other site: Secondary | ICD-10-CM | POA: Insufficient documentation

## 2013-07-11 DIAGNOSIS — M25551 Pain in right hip: Secondary | ICD-10-CM | POA: Insufficient documentation

## 2013-07-11 NOTE — Progress Notes (Signed)
   Subjective:    Patient ID: Jennifer Owen, female    DOB: 1931/04/21, 80 y.o.   MRN: [id number]  HPI Comments: 80 y.o Past Medical History GERD (gastroesophageal reflux disease), Hypertension  (BP 162/80 then 160/80), Low back pain with assoc. 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   She presents for f/u:  1) She c/o right hip, buttock pain that radiates to her groin.  She thought it was related to a "boil" on her right buttock there in June 2015 but the boil is resolved. She has moderate pain in this area and it hurts to walk.  She baseline walks with a cane.  She has not been taking her Norco as she does not have a prescription currently and needs to pick up her Rx from the pharmacy. She tried Tylenol w/ some relief.  She denies leg weakness, numbness/tingling in the perineal region though has chronic numbness and tingling down b/l legs, bowel or bladder incontinence.   MRI 03/2013 with lumbar spondylosis L5-S1 severe arthropathy and bulging with mod to severe foraminal (right) stenosis.    She denies any recent falls.    SH: she is doing water aerobics, daughter died in the past 18 years ago, mother died age 78 of lung cancer.                                     Review of Systems  Constitutional: Negative for fever.       Always cold   Gastrointestinal: Negative for constipation.       Objective:   Physical Exam  Nursing note and vitals reviewed. Constitutional: She is oriented to person, place, and time. She appears well-developed and well-nourished. She is cooperative. No distress.  HENT:  Head: Normocephalic and atraumatic.  Mouth/Throat: Oropharynx is clear and moist and mucous membranes are normal. No oropharyngeal exudate.  Eyes: Conjunctivae are normal. Pupils are equal, round, and reactive to light. Right eye exhibits no  discharge. Left eye exhibits no discharge. No scleral icterus.  Cardiovascular: Normal rate, regular rhythm, S1 normal, S2 normal and normal heart sounds.   No murmur heard. Pulmonary/Chest: Effort normal and breath sounds normal. No respiratory distress. She has no wheezes.  Abdominal: Soft. Bowel sounds are normal. There is no tenderness.  Musculoskeletal:       Back:       Legs: Neurological: She is alert and oriented to person, place, and time.  BL walks with cane   Skin: Skin is warm and dry. No rash noted. She is not diaphoretic.  Psychiatric: She has a normal mood and affect. Her speech is normal and behavior is normal. Judgment and thought content normal. Cognition and memory are normal.          Assessment & Plan:  F/u in 2 weeks if pain not improved

## 2013-07-11 NOTE — Assessment & Plan Note (Signed)
Hip pain could be related degenerative changes (OA) noted on previous imaging  Advised pt to take prn Norco RTC in 1-2 weeks if not improved

## 2013-07-11 NOTE — Assessment & Plan Note (Addendum)
Pain may be referred from lumbar radiculopathy, ischial bursitis and patient has h/o degenerative changes in hips noted on prior Xray Tried prn Tylenol with relief Advised to take as need Norco/Vicodin which she will pick up from pharmacy 07/11/13  F/u in 1-2 weeks prn

## 2013-07-15 NOTE — Progress Notes (Signed)
INTERNAL MEDICINE TEACHING ATTENDING ADDENDUM - Lashawn Orrego, MD: I reviewed and discussed at the time of visit with the resident Dr. McLean, the patient's medical history, physical examination, diagnosis and results of tests and treatment and I agree with the patient's care as documented. 

## 2013-07-22 ENCOUNTER — Ambulatory Visit (INDEPENDENT_AMBULATORY_CARE_PROVIDER_SITE_OTHER): Payer: PRIVATE HEALTH INSURANCE | Admitting: Internal Medicine

## 2013-07-22 ENCOUNTER — Encounter: Payer: Self-pay | Admitting: Internal Medicine

## 2013-07-22 VITALS — BP 180/92 | HR 52 | Temp 98.2°F | Wt 206.8 lb

## 2013-07-22 DIAGNOSIS — M25551 Pain in right hip: Secondary | ICD-10-CM

## 2013-07-22 DIAGNOSIS — E785 Hyperlipidemia, unspecified: Secondary | ICD-10-CM

## 2013-07-22 DIAGNOSIS — M25559 Pain in unspecified hip: Secondary | ICD-10-CM

## 2013-07-22 DIAGNOSIS — IMO0001 Reserved for inherently not codable concepts without codable children: Secondary | ICD-10-CM

## 2013-07-22 DIAGNOSIS — M7918 Myalgia, other site: Secondary | ICD-10-CM

## 2013-07-22 DIAGNOSIS — I10 Essential (primary) hypertension: Secondary | ICD-10-CM

## 2013-07-22 LAB — LIPID PANEL
CHOL/HDL RATIO: 2.7 ratio
Cholesterol: 257 mg/dL — ABNORMAL HIGH (ref 0–200)
HDL: 96 mg/dL (ref >39)
LDL Cholesterol: 140 mg/dL — ABNORMAL HIGH (ref 0–99)
Triglycerides: 104 mg/dL (ref <150)
VLDL: 21 mg/dL (ref 0–40)

## 2013-07-22 NOTE — Assessment & Plan Note (Signed)
She is currently on HCTZ 25 mg daily and lisinopril 40 mg daily. Her blood pressure here was initially 164/81 and repeat blood pressure was 180/92. She mentioned that her home blood pressure is 150/69 which is checked by a nurse on a monthly basis. She will continue her current antihypertensive medication regimen. We have referred her to a Child psychotherapistsocial worker for home blood pressure monitor. I start to take her blood pressure measurements at home and keep a log. We asked her to followup in one week with her blood pressure log.

## 2013-07-22 NOTE — Patient Instructions (Signed)
Your blood pressure was high today. Please use the blood pressure machine to check your blood pressure at home and keep a log of the readings.  Bring your reading with you in 1 week for your follow up appointment.

## 2013-07-22 NOTE — Assessment & Plan Note (Signed)
Pain is significantly improved. It is likely from degenerative changes in the hips or lumbar radiculopathy. Advised her to take Tylenol and hydrocodone as needed for pain.

## 2013-07-22 NOTE — Assessment & Plan Note (Signed)
Her pain is a lot better with hydrocodone. I asked her to first take Tylenol for pain as needed and then try hydrocodone if the pain still persists. She agrees to slowly wean off from hydrocodone. She still has several hydrocodone tablets left so we did not give her a refill.

## 2013-07-22 NOTE — Progress Notes (Addendum)
   Subjective:    Patient ID: Jennifer Owen, female    DOB: 1931-08-12, 80 y.o.   MRN: [id number]  HPI Patient is 80 yo with hx of osteoarthritis, HTN, HLD comes in for 2 week follow up for right hip and gluteal pain. Patient states her pain is better. The pain is intermittent but currently she has 0 pain right now. She has been taking her hydrocodone pain medication twice daily. She still continues to have bilateral chronic knee pain from her arthritis. She recently received her last steroid injection at the sports medicine clinic. She has been doing water aerobics for last 3 years which is helping her with the pain.  Her blood pressure last time was elevated. She brought a record of her blood pressure measurement at home which he is 164/81. She has been compliant with all of her medications.    Review of Systems  Constitutional: Negative.   HENT: Negative.   Eyes: Negative.   Respiratory: Negative.   Cardiovascular: Negative.   Gastrointestinal: Negative.   Endocrine: Negative.   Genitourinary: Negative.   Musculoskeletal: Positive for arthralgias, back pain and myalgias. Negative for gait problem, joint swelling, neck pain and neck stiffness.  Allergic/Immunologic: Negative.   Neurological: Negative.        Objective:   Physical Exam  Constitutional: She is oriented to person, place, and time. She appears well-developed and well-nourished. No distress.  HENT:  Head: Normocephalic and atraumatic.  Right Ear: External ear normal.  Left Ear: External ear normal.  Nose: Nose normal.  Mouth/Throat: Oropharynx is clear and moist.  Eyes: Conjunctivae and EOM are normal. Pupils are equal, round, and reactive to light. Right eye exhibits no discharge. Left eye exhibits no discharge. No scleral icterus.  Neck: Normal range of motion. Neck supple. No JVD present. No thyromegaly present.  Cardiovascular: Normal rate, regular rhythm, normal heart sounds and intact distal pulses.  Exam  reveals no gallop and no friction rub.   No murmur heard. Pulmonary/Chest: Effort normal and breath sounds normal. No respiratory distress. She has no wheezes. She has no rales. She exhibits no tenderness.  Abdominal: Soft. Bowel sounds are normal. She exhibits no distension and no mass. There is no tenderness. There is no rebound and no guarding.  Musculoskeletal: Normal range of motion. She exhibits no edema.       Right knee: She exhibits normal range of motion, no swelling and no effusion. No tenderness found.       Left knee: She exhibits normal range of motion, no swelling and no effusion. Tenderness found.       Back:  She has some tenderness to palpation on the right gluteal region.  Lymphadenopathy:    She has no cervical adenopathy.  Neurological: She is alert and oriented to person, place, and time. She has normal strength and normal reflexes. No sensory deficit.  Skin: She is not diaphoretic.  Psychiatric: She has a normal mood and affect. Her behavior is normal.          Assessment & Plan:  Please see problem based assessment and plan.   I saw and evaluated the patient.  I personally confirmed the key portions of the history and exam documented by Dr. Tasia CatchingsAhmed and I reviewed pertinent patient test results.  The assessment, diagnosis, and plan were formulated together and I agree with the documentation in the resident's note.

## 2013-07-22 NOTE — Assessment & Plan Note (Signed)
Her last lipid profile was on 2013. At that time the LDL was 90. We will do a lipid profile today. She is nonfasting. She is not on any medication.

## 2013-07-23 ENCOUNTER — Telehealth: Payer: Self-pay | Admitting: *Deleted

## 2013-07-23 NOTE — Telephone Encounter (Signed)
Dr Tasia CatchingsAhmed - are you OK if I send Rx to pharmacy for Zoster vaccine?

## 2013-07-23 NOTE — Telephone Encounter (Signed)
Call from pt yesterday afternoon - states rx for Shingles vaccine was not sent to her pharmacy. Thanks

## 2013-07-23 NOTE — Telephone Encounter (Signed)
Pharmacy calls and states pt has been expecting an order for ZOSTAVAX, they have not rec'd this, it was evidently discussed at her recent visit

## 2013-07-24 ENCOUNTER — Telehealth: Payer: Self-pay | Admitting: Licensed Clinical Social Worker

## 2013-07-24 NOTE — Telephone Encounter (Signed)
[name] was referred to CSW for [facility] referral to assist [name] with obtaining a home bp cuff.  Currently, [name] medicaid PCP is not [facility], unable to make referral until card is updated.  Updated information was faxed in 12/2012, card was updated and scanned 02/2013. Now pt's card has been reverted back to [employer].  CSW will request assist from [facility] with change and bp cuff once pt is in agreement.  CSW placed called to pt.  CSW left message requesting return call. CSW provided contact hours and phone number.

## 2013-07-25 MED ORDER — ZOSTER VACCINE LIVE 19400 UNT/0.65ML ~~LOC~~ SOLR: 0.6500 mL | Freq: Once | SUBCUTANEOUS | Status: DC

## 2013-07-29 ENCOUNTER — Ambulatory Visit: Payer: PRIVATE HEALTH INSURANCE | Admitting: Internal Medicine

## 2013-07-29 ENCOUNTER — Ambulatory Visit (INDEPENDENT_AMBULATORY_CARE_PROVIDER_SITE_OTHER): Payer: PRIVATE HEALTH INSURANCE | Admitting: Internal Medicine

## 2013-07-29 ENCOUNTER — Encounter: Payer: Self-pay | Admitting: Internal Medicine

## 2013-07-29 VITALS — BP 133/71 | HR 57 | Temp 97.4°F | Ht 64.0 in | Wt 202.7 lb

## 2013-07-29 DIAGNOSIS — I10 Essential (primary) hypertension: Secondary | ICD-10-CM

## 2013-07-29 DIAGNOSIS — E785 Hyperlipidemia, unspecified: Secondary | ICD-10-CM

## 2013-07-29 DIAGNOSIS — I872 Venous insufficiency (chronic) (peripheral): Secondary | ICD-10-CM

## 2013-07-29 NOTE — Telephone Encounter (Signed)
CSW placed called to pt.  CSW left message requesting return call. CSW provided contact hours and phone number. 

## 2013-07-29 NOTE — Assessment & Plan Note (Signed)
Patient's lipid profile from last week was LDL 140, HDL 96, and choelsterol 257. Her Framingham 10 year risk is 10%. She has 2+ risk factors for ASCVD (smoker, age, HTN). Based on this, her LDL goal is <130. We instructed her to do lifestyle changes (exercise, eat low fat diet, lose weight). Will check Lipid profile in 3 month. Will not start statin currently.

## 2013-07-29 NOTE — Progress Notes (Signed)
   Subjective:    Patient ID: Jennifer Owen, female    DOB: 1931/05/29, 80 y.o.   MRN: [id number]  HPI  [name] is 80 yo female with hx of HTN, HLD, venous insufficiency, chronic knee pain, who comes back today for blood pressure management follow up. Patient's last clinic BP when I saw her on 07/22/13 was 164/81 and 180/92 on repeat. We asked her to check her BP daily. She has brought her BP records from home. She checks it around 7:30 am each morning before her breakfast or her BP meds. The SBP has been 145-150s with some 170's. She has been watching her salt intake, trying to lose weight, and has been eating non fat diet. She has been taking all of there BP meds and other meds as needed.   She complaints of some worsening of her baseline chronic leg swelling and pain. She noticed increased swelling on left leg and some pain on the leg that worsens when she is on her feet the whole day. The swelling and pain decreases when she rests or keep her legs flat. Denies any SOB, CP, palpitations, fever, chills. Denies any prolonged rest or recent travel.  She does not have any other complaints currently.    Review of Systems  Constitutional: Negative.   HENT: Negative.   Eyes: Negative.   Respiratory: Negative.   Cardiovascular: Positive for leg swelling.  Gastrointestinal: Negative.   Endocrine: Negative.   Genitourinary: Negative.   Musculoskeletal: Positive for arthralgias, back pain and joint swelling. Negative for neck pain and neck stiffness.  Skin: Negative.   Allergic/Immunologic: Negative.   Neurological: Negative.   Hematological: Negative.   Psychiatric/Behavioral: Negative.        Objective:   Physical Exam  Constitutional: She is oriented to person, place, and time. She appears well-developed and well-nourished. No distress.  HENT:  Head: Normocephalic and atraumatic.  Right Ear: External ear normal.  Left Ear: External ear normal.  Nose: Nose normal.    Mouth/Throat: Oropharynx is clear and moist.  Eyes: Conjunctivae and EOM are normal. Pupils are equal, round, and reactive to light. Right eye exhibits no discharge. Left eye exhibits no discharge. No scleral icterus.  Neck: Normal range of motion. Neck supple. No JVD present. No thyromegaly present.  Cardiovascular: Normal rate, regular rhythm, normal heart sounds and intact distal pulses.  Exam reveals no gallop and no friction rub.   No murmur heard. Pulmonary/Chest: Effort normal and breath sounds normal. No respiratory distress. She has no wheezes. She has no rales. She exhibits no tenderness.  Abdominal: Soft. Bowel sounds are normal. She exhibits no distension and no mass. There is no tenderness. There is no rebound and no guarding.  Musculoskeletal: Normal range of motion. She exhibits no edema and no tenderness.  Has trace pitting edema on bilateral ankles. Has tenderness on both lower legs and calves. Swelling is equal on both sides. No erythema or warmth.  Lymphadenopathy:    She has no cervical adenopathy.  Neurological: She is alert and oriented to person, place, and time. She has normal reflexes. No cranial nerve deficit.  Skin: She is not diaphoretic.  Psychiatric: She has a normal mood and affect. Her behavior is normal.        Assessment & Plan:  Please see problem based assessment and plan.

## 2013-07-29 NOTE — Patient Instructions (Signed)
Please keep checking your blood pressure and keep record of it. Check it after you take your Blood pressure medicines.   If your leg swelling gets worse or your have more pain or swelling on one leg than the other then please call us.   Your LDL was 140, higher than your goal of 130. Keep exercising, eat healthy diet, and try to lose some weight.  Follow up in 3 months.

## 2013-07-29 NOTE — Assessment & Plan Note (Signed)
Patient's last clinic BP was 164/81 and 180/92 on repeat. She has brought her BP records from home. She checks it around 7:30 am each morning before her breakfast or her BP meds. The SBP has been 145-150s with some 170's. Today in clinic the BP was 133/71. Her BP is mostly under her goal [facility] of <150/90). We will keep her on the same meds for now (HCTZ 25mg  once daily and lisinopril 40mg  daily).  Asked her to check her BP daily about 1 hour after she takes her BP meds. Will re-assess need for med adjustment on the next visit in 3 months.

## 2013-07-29 NOTE — Assessment & Plan Note (Addendum)
She has chronic swelling of lower extremities with tenderness on both sides. It worsens when she is on her feet whole day and improves with rest. Has hx of PE, negative doppler on 04/2011. On exam her leg swelling is equal on both legs and the mild tenderness is also present on both legs. She is low risk for DVT on Wells criteria. Will not do DVT study.   Asked her to call us if tenderness or swelling gets worse, especially on on legs vs the other.

## 2013-07-30 NOTE — Progress Notes (Signed)
INTERNAL MEDICINE TEACHING ATTENDING ADDENDUM - Jennifer LagosNischal Aurielle Slingerland, MD: I personally saw and evaluated Jennifer Owen in this clinic visit in conjunction with the resident, Dr. Tasia CatchingsAhmed. I have discussed patient's plan of care with medical resident during this visit. I have confirmed the physical exam findings and have read and agree with the clinic note including the plan with the following addition: - Patient here for follow up for HTN. Also complains of mild LE edema b/l - BP today at goal. Continue with current medications for now - Unlikely DVT per Wells criteria. Will monitor for now

## 2013-08-05 NOTE — Telephone Encounter (Signed)
CSW will notify [facility] of need for change and request BP cuff.

## 2013-08-08 ENCOUNTER — Ambulatory Visit (HOSPITAL_COMMUNITY)
Admission: RE | Admit: 2013-08-08 | Discharge: 2013-08-08 | Disposition: A | Discharge status: Home / Self Care | Payer: PRIVATE HEALTH INSURANCE | Source: Ambulatory Visit | Attending: Internal Medicine | Admitting: Internal Medicine

## 2013-08-08 ENCOUNTER — Telehealth: Payer: Self-pay | Admitting: *Deleted

## 2013-08-08 DIAGNOSIS — Z1231 Encounter for screening mammogram for malignant neoplasm of breast: Secondary | ICD-10-CM | POA: Diagnosis present

## 2013-08-08 NOTE — Telephone Encounter (Signed)
Call from pt - Pt states she needs a rx faxed to [facility] for a mattress for a hospital bed. States she had talked to [facility] who stated need to include on the rx a need for the mattress. I told the pt/daughter this was discussed at the last visit. Daughter stated pt has severe arthritis. Faxed # [phone]. Thanks

## 2013-08-09 NOTE — Telephone Encounter (Signed)
Does the pt already have a hospital bed and this is just a replacement mattress?   You mentioned that it had been discussed last visit but I read Dr Marcelino FreestoneAhmed's last two notes and it was not mentioned.   Thanks

## 2013-08-09 NOTE — Telephone Encounter (Signed)
Jennifer b. And Jennifer p. addressing

## 2013-08-20 NOTE — Telephone Encounter (Signed)
Pt's PCP has been updated.  Request for bp cuff faxed to [facility].

## 2013-08-23 NOTE — Telephone Encounter (Signed)
Pt has an appt 09/11/13 for DME hospital bed.

## 2013-09-05 ENCOUNTER — Ambulatory Visit: Payer: Medicare Other | Admitting: Podiatrist

## 2013-09-11 ENCOUNTER — Encounter: Payer: PRIVATE HEALTH INSURANCE | Admitting: Internal Medicine

## 2013-09-11 ENCOUNTER — Ambulatory Visit (INDEPENDENT_AMBULATORY_CARE_PROVIDER_SITE_OTHER): Payer: PRIVATE HEALTH INSURANCE | Admitting: Internal Medicine

## 2013-09-11 ENCOUNTER — Encounter: Payer: Self-pay | Admitting: Internal Medicine

## 2013-09-11 VITALS — BP 131/70 | HR 58 | Temp 97.0°F | Wt 203.9 lb

## 2013-09-11 DIAGNOSIS — I10 Essential (primary) hypertension: Secondary | ICD-10-CM

## 2013-09-11 DIAGNOSIS — R300 Dysuria: Secondary | ICD-10-CM

## 2013-09-11 DIAGNOSIS — Z23 Encounter for immunization: Secondary | ICD-10-CM

## 2013-09-11 DIAGNOSIS — R350 Frequency of micturition: Secondary | ICD-10-CM

## 2013-09-11 NOTE — Patient Instructions (Signed)
General Instructions: We will check your urine today for infection and if it is present i will call you and let you know what to do.  Continue taking your blood pressure medications as prescribed. Congratulations on your good blood pressure control.   Thank you for bringing your medicines today. This helps [place] keep you safe from mistakes.   Progress Toward Treatment Goals:  Treatment Goal 07/22/2013  Blood pressure unchanged

## 2013-09-11 NOTE — Assessment & Plan Note (Addendum)
Pt presented with Frequency, no dysuria, abdominal pain or fever. Recent increase in water in take, pt says to stay healthy. Weight appears stable. NO hx of DM, or polyphagia, also Bmets in the past 2 years- all with normal blood glucose level- last done- 06/07/2013  Plan- Urinalysis.  Addendum- UA- positive for LE and 3-6 WBC, together with symptoms, will treat for UTI- With Bactrim- 800-160mg  BID for 3 days.

## 2013-09-11 NOTE — Progress Notes (Signed)
Patient ID: Jennifer Owen, female   DOB: [birth year], 80 y.o.   MRN: [id number]   Subjective:   Patient ID: Jennifer Owen female   DOB: 10-20-1931 80 y.o.   MRN: [id number]  HPI: Jennifer Owen is a 80 y.o. with PMH of HTN, OA- multiple joints, Hx of PE, HLD, intially made an appointment to see [place] today to get DME- Hospital bed, but no longer needs it, as somebody gave her one.  Today she appears of increased urinary frequency over the past 4 weeks, at night she wakes up 3 times to pass urine, and several times during the day. Previously used to wake up once if at all. No dysuria , or urgency, no lower abdominal tenderness, No fever. Patient does endorse drinking lots of water recently- not because she is thirsty, but to stay healthy she says. Weight is stable, no polyphagia, no hx of DM.      Past Medical History  Diagnosis Date  . GERD (gastroesophageal reflux disease)     Papulous gastropathy EGD Oct. 2007  . Hypertension   . Low back pain     intermittent radiculopathy  . Osteoarthritis   . Pulmonary embolism     2/2 knee replacement  . Osteopenia     T score-1.1 R Fremur, - 0.4 L spine  . Hyperlipemia   . Polio 1942  . Urinary incontinence   . IBS (irritable bowel syndrome)    Current Outpatient Prescriptions  Medication Sig Dispense Refill  . aspirin 81 MG chewable tablet Chew 81 mg by mouth daily.        . B Complex-Biotin-FA (B-50 COMPLEX PO) Take 1 tablet by mouth daily.      . diclofenac sodium (VOLTAREN) 1 % GEL Apply 2 g topically 4 (four) times daily.  100 g  1  . fish oil-omega-3 fatty acids 1000 MG capsule Take 2 g by mouth daily.      . fluocinonide ointment (LIDEX) 0.05 % Apply 1 application topically daily. Left foot      . hydrochlorothiazide (HYDRODIURIL) 25 MG tablet Take 1 tablet (25 mg total) by mouth daily.  30 tablet  11  . HYDROcodone-acetaminophen (NORCO/VICODIN) 5-325 MG per tablet Take one tablet by mouth every 8 hours as needed for knee pain   90 tablet  0  . lisinopril (PRINIVIL,ZESTRIL) 40 MG tablet Take 1 tablet (40 mg total) by mouth daily.  30 tablet  11  . NEXIUM 40 MG capsule TAKE 1 CAPSULE BY MOUTH DAILY BEFORE BREAKFAST.  30 capsule  11  . nitroGLYCERIN (NITROSTAT) 0.4 MG SL tablet Place 0.4 mg under the tongue every 5 (five) minutes as needed for chest pain.      . polyethylene glycol (MIRALAX / GLYCOLAX) packet Take 17 g by mouth daily as needed.      Marland Kitchen PROCTOSOL HC 2.5 % rectal cream       . traZODone (DESYREL) 50 MG tablet 50 mg at bedtime as needed.      . zoster vaccine live, PF, (ZOSTAVAX) 78295 UNT/0.65ML injection Inject 19,400 Units into the skin once.  1 each  0  . [DISCONTINUED] simvastatin (ZOCOR) 20 MG tablet Take 20 mg by mouth every evening.       No current facility-administered medications for this visit.   Family History  Problem Relation Age of Onset  . Cancer Daughter     colon caner, <60  . Cancer Mother     lung   History  Social History  . Marital Status: Legally Separated    Spouse Name: N/A    Number of Children: 7  . Years of Education: N/A   Occupational History  . 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 [employer]    Social History Main Topics  . Smoking status: Former Games developer  . Smokeless tobacco: None  . Alcohol Use: No  . Drug Use: No  . Sexual Activity: None   Other Topics Concern  . None   Social History Narrative   [facility]   Review of Systems: CONSTITUTIONAL- No Fever, weightloss, night sweat or change in appetite. SKIN- No Rash, colour changes or itching. HEAD- No Headache or dizziness. EYES- No Vision loss, pain, redness, double or blurred vision. RESPIRATORY- No Cough or SOB. CARDIAC- No Palpitations,  or chest pain. GI- No nausea, vomiting, diarrhoea, constipation, abd pain. NEUROLOGIC- No Numbness, syncope, seizures or burning. [facility]- Denies depression or anxiety.  Objective:  Physical Exam: Filed Vitals:   09/11/13 1102  BP:  131/70  Pulse: 58  Temp: 97 F (36.1 C)  TempSrc: Oral  Weight: 203 lb 14.4 oz (92.488 kg)  SpO2: 100%   GENERAL- alert, co-operative, appears as stated age, not in any distress. HEENT- Atraumatic, normocephalic, PERRL, EOMI, oral mucosa appears moist, neck supple. CARDIAC- RRR, no murmurs, rubs or gallops. RESP- Moving equal volumes of air, and clear to auscultation bilaterally, no wheezes or crackles. ABDOMEN- Soft, nontender, no guarding or rebound, no palpable masses or organomegaly, bowel sounds present. NEURO- No obvious Cr N abnormality, strenght upper and lower extremities- Normal, Sensation intact- globally, DTRs- Normal, finger to nose test normal bilat, rapid alternating movement- intact, Gait- slow, and kind of hobbles, due to OA and childhood hx of Polio. EXTREMITIES- pulse 2+, symmetric, no pedal edema. SKIN- Warm, dry, No rash or lesion. PSYCH- Normal mood and affect, appropriate thought content and speech.  Assessment & Plan:   The patient's case and plan of care was discussed with attending physician, Dr. Josem Kaufmann.  Please see problem based charting for assessment and plan.

## 2013-09-11 NOTE — Assessment & Plan Note (Signed)
Patient brought blood pressure log recorded everyday over the past month. Blood pressure range mostly- 120s- 140s systolic, Diastolic average 16X. 2 readings above 150. Pt says she is compliant with her meds. No dizzy episodes. No cough.  Plan- Continue Blood pressure meds- Lisinopril-  daily, HCTZ-  daily. - See in 3 months.

## 2013-09-11 NOTE — Progress Notes (Signed)
Case discussed with Dr. Emokpae at time of visit.  We reviewed the resident's history and exam and pertinent patient test results.  I agree with the assessment, diagnosis, and plan of care documented in the resident's note. 

## 2013-09-12 ENCOUNTER — Encounter: Payer: Self-pay | Admitting: Podiatrist

## 2013-09-12 ENCOUNTER — Ambulatory Visit (INDEPENDENT_AMBULATORY_CARE_PROVIDER_SITE_OTHER): Payer: Medicare Other | Admitting: Podiatrist

## 2013-09-12 ENCOUNTER — Ambulatory Visit: Payer: Medicare Other | Admitting: Podiatrist

## 2013-09-12 DIAGNOSIS — B351 Tinea unguium: Secondary | ICD-10-CM

## 2013-09-12 DIAGNOSIS — M79609 Pain in unspecified limb: Secondary | ICD-10-CM

## 2013-09-12 DIAGNOSIS — M79676 Pain in unspecified toe(s): Principal | ICD-10-CM

## 2013-09-12 LAB — URINALYSIS, ROUTINE W REFLEX MICROSCOPIC
BILIRUBIN URINE: NEGATIVE
Glucose, UA: NEGATIVE mg/dL
Hgb urine dipstick: NEGATIVE
Ketones, ur: NEGATIVE mg/dL
Nitrite: NEGATIVE
PH: 5.5 (ref 5.0–8.0)
Protein, ur: NEGATIVE mg/dL
SPECIFIC GRAVITY, URINE: 1.009 (ref 1.005–1.030)
Urobilinogen, UA: 0.2 mg/dL (ref 0.0–1.0)

## 2013-09-12 LAB — URINALYSIS, MICROSCOPIC ONLY
Bacteria, UA: NONE SEEN
CASTS: NONE SEEN
CRYSTALS: NONE SEEN

## 2013-09-15 ENCOUNTER — Other Ambulatory Visit: Payer: Self-pay | Admitting: Internal Medicine

## 2013-09-15 MED ORDER — SULFAMETHOXAZOLE-TMP DS 800-160 MG PO TABS: 1.0000 | ORAL_TABLET | Freq: Two times a day (BID) | ORAL | Status: DC

## 2013-09-16 NOTE — Progress Notes (Signed)
HPI:  Patient presents today for follow up of foot and nail care. Denies any new complaints today.  Objective:  Patients chart is reviewed.  Vascular status reveals pedal pulses noted at 1 out of 4 dp and pt bilateral .  Neurological sensation is intact to Semmes Weinstein monofilament bilateral.  Patients nails are thickened, discolored, distrophic, friable and brittle with yellow-brown discoloration. Patient subjectively relates they are painful with shoes and with ambulation of bilateral feet.  Assessment:  Symptomatic onychomycosis  Plan:  Discussed treatment options and alternatives.  The symptomatic toenails were debrided through manual an mechanical means without complication.  Return appointment recommended at routine intervals of 3 months    Wane Mollett, DPM   

## 2013-09-17 ENCOUNTER — Other Ambulatory Visit: Payer: Self-pay | Admitting: Internal Medicine

## 2013-09-17 ENCOUNTER — Other Ambulatory Visit: Payer: Self-pay | Admitting: *Deleted

## 2013-09-17 DIAGNOSIS — I10 Essential (primary) hypertension: Secondary | ICD-10-CM

## 2013-09-17 MED ORDER — HYDROCHLOROTHIAZIDE 25 MG PO TABS: 25.0000 mg | ORAL_TABLET | Freq: Every day | ORAL | Status: DC

## 2013-09-17 MED ORDER — HYDROCODONE-ACETAMINOPHEN 5-325 MG PO TABS
ORAL_TABLET | ORAL | Status: DC
Start: 2013-09-17 — End: 2013-11-20

## 2013-10-21 ENCOUNTER — Encounter: Payer: PRIVATE HEALTH INSURANCE | Admitting: Internal Medicine

## 2013-10-22 ENCOUNTER — Other Ambulatory Visit: Payer: Self-pay | Admitting: Internal Medicine

## 2013-10-22 ENCOUNTER — Ambulatory Visit (INDEPENDENT_AMBULATORY_CARE_PROVIDER_SITE_OTHER): Payer: PRIVATE HEALTH INSURANCE | Admitting: Internal Medicine

## 2013-10-22 ENCOUNTER — Encounter: Payer: Self-pay | Admitting: Internal Medicine

## 2013-10-22 VITALS — BP 152/78 | HR 71 | Temp 98.5°F | Wt 207.0 lb

## 2013-10-22 DIAGNOSIS — I10 Essential (primary) hypertension: Secondary | ICD-10-CM

## 2013-10-22 DIAGNOSIS — R300 Dysuria: Secondary | ICD-10-CM

## 2013-10-22 LAB — POCT URINALYSIS DIPSTICK
Bilirubin, UA: NEGATIVE
Glucose, UA: NEGATIVE
KETONES UA: NEGATIVE
Nitrite, UA: NEGATIVE
PH UA: 5.5
PROTEIN UA: NEGATIVE
RBC UA: NEGATIVE
SPEC GRAV UA: 1.025
Urobilinogen, UA: 0.2

## 2013-10-22 MED ORDER — CIPROFLOXACIN HCL 500 MG PO TABS: 500.0000 mg | ORAL_TABLET | Freq: Two times a day (BID) | ORAL | Status: AC

## 2013-10-22 NOTE — Patient Instructions (Signed)
**Begin the Ciprofloxacin 500mg  (1 tablet) 2 times a day for 7 days.  **if your symptoms do not improve or worsen, please call the clinic.   Ciprofloxacin tablets What is this medicine? CIPROFLOXACIN (sip roe FLOX a sin) is a quinolone antibiotic. It is used to treat certain kinds of bacterial infections. It will not work for colds, flu, or other viral infections. This medicine may be used for other purposes; ask your health care provider or pharmacist if you have questions. COMMON BRAND NAME(S): Cipro What should I tell my health care provider before I take this medicine? They need to know if you have any of these conditions: -bone problems -cerebral disease -joint problems -irregular heartbeat -kidney disease -liver disease -myasthenia gravis -seizure disorder -tendon problems -an unusual or allergic reaction to ciprofloxacin, other antibiotics or medicines, foods, dyes, or preservatives -pregnant or trying to get pregnant -breast-feeding How should I use this medicine? Take this medicine by mouth with a glass of water. Follow the directions on the prescription label. Take your medicine at regular intervals. Do not take your medicine more often than directed. Take all of your medicine as directed even if you think your are better. Do not skip doses or stop your medicine early. You can take this medicine with food or on an empty stomach. It can be taken with a meal that contains dairy or calcium, but do not take it alone with a dairy product, like milk or yogurt or calcium-fortified juice. A special MedGuide will be given to you by the pharmacist with each prescription and refill. Be sure to read this information carefully each time. Talk to your pediatrician regarding the use of this medicine in children. Special care may be needed. Overdosage: If you think you have taken too much of this medicine contact a poison control center or emergency room at once. NOTE: This medicine is only  for you. Do not share this medicine with others. What if I miss a dose? If you miss a dose, take it as soon as you can. If it is almost time for your next dose, take only that dose. Do not take double or extra doses. What may interact with this medicine? Do not take this medicine with any of the following medications: -cisapride -droperidol -terfenadine -tizanidine This medicine may also interact with the following medications: -antacids -birth control pills -caffeine -cyclosporin -didanosine (ddI) buffered tablets or powder -medicines for diabetes -medicines for inflammation like ibuprofen, naproxen -methotrexate -multivitamins -omeprazole -phenytoin -probenecid -sucralfate -theophylline -warfarin This list may not describe all possible interactions. Give your health care provider a list of all the medicines, herbs, non-prescription drugs, or dietary supplements you use. Also tell them if you smoke, drink alcohol, or use illegal drugs. Some items may interact with your medicine. What should I watch for while using this medicine? Tell your doctor or health care professional if your symptoms do not improve. Do not treat diarrhea with over the counter products. Contact your doctor if you have diarrhea that lasts more than 2 days or if it is severe and watery. You may get drowsy or dizzy. Do not drive, use machinery, or do anything that needs mental alertness until you know how this medicine affects you. Do not stand or sit up quickly, especially if you are an older patient. This reduces the risk of dizzy or fainting spells. This medicine can make you more sensitive to the sun. Keep out of the sun. If you cannot avoid being in the sun, wear  protective clothing and use sunscreen. Do not use sun lamps or tanning beds/booths. Avoid antacids, aluminum, calcium, iron, magnesium, and zinc products for 6 hours before and 2 hours after taking a dose of this medicine. What side effects may I  notice from receiving this medicine? Side effects that you should report to your doctor or health care professional as soon as possible: - allergic reactions like skin rash, itching or hives, swelling of the face, lips, or tongue - breathing problems - confusion, nightmares or hallucinations - feeling faint or lightheaded, falls - irregular heartbeat - joint, muscle or tendon pain or swelling - pain or trouble passing urine -persistent headache with or without blurred vision - redness, blistering, peeling or loosening of the skin, including inside the mouth - seizure - unusual pain, numbness, tingling, or weakness Side effects that usually do not require medical attention (report to your doctor or health care professional if they continue or are bothersome): - diarrhea - nausea or stomach upset - white patches or sores in the mouth This list may not describe all possible side effects. Call your doctor for medical advice about side effects. You may report side effects to FDA at 1-800-FDA-1088. Where should I keep my medicine? Keep out of the reach of children. Store at room temperature below 30 degrees C (86 degrees F). Keep container tightly closed. Throw away any unused medicine after the expiration date. NOTE: This sheet is a summary. It may not cover all possible information. If you have questions about this medicine, talk to your doctor, pharmacist, or health care provider.  2015, Elsevier/Gold Standard. (2012-08-02 16:10:46)   General Instructions:   Please bring your medicines with you each time you come to clinic.  Medicines may include prescription medications, over-the-counter medications, herbal remedies, eye drops, vitamins, or other pills.   Progress Toward Treatment Goals:  Treatment Goal 07/22/2013  Blood pressure unchanged    Self Care Goals & Plans:  Self Care Goal 09/11/2013  Manage my medications take my medicines as prescribed; bring my  medications to every visit; refill my medications on time  Monitor my health keep track of my blood pressure  Eat healthy foods eat more vegetables; eat foods that are low in salt  Be physically active find an activity I enjoy  Meeting treatment goals -    No flowsheet data found.   Care Management & Community Referrals:  Referral 07/22/2013  Referrals made for care management support social worker  Referrals made to community resources other (see comments)

## 2013-10-22 NOTE — Addendum Note (Signed)
Addended by: Maura CrandallGOLDSTON, Taiesha Bovard C on: 10/22/2013 05:05 PM   Modules accepted: Orders

## 2013-10-22 NOTE — Assessment & Plan Note (Signed)
BP Readings from Last 3 Encounters:  10/22/13 152/78  09/11/13 131/70  07/29/13 133/71    Lab Results  Component Value Date   NA 134* 06/07/2013   K 4.2 06/07/2013   CREATININE 1.00 06/07/2013    Assessment: Blood pressure control: mildly elevated Progress toward BP goal:  unchanged Comments: BP elevated but in the setting of dysuria and bladder pain.  Plan: Medications:  continue current medications Educational resources provided:   Self management tools provided:   Other plans: F/u with PCP in Nov

## 2013-10-22 NOTE — Progress Notes (Signed)
Patient ID: Jennifer Owen, female   DOB: [birth year], 80 y.o.   MRN: [id number]  Subjective:   Patient ID: Jennifer AlaBarbara E Wall female   DOB: [birth year] 80 y.o.   MRN: [id number]  HPI: Lloyd HugerMs.Roxy E Juanetta GoslingHawkins is a 80 y.o. F w/ PMH HTN, OA- multiple joints, Hx of PE, and HLF who presents for a UTI.  She was seen 9/2 with increased urinary frequency. UA + leukocytes, 3-6 WBCs, no bacteria. Urine culture was not obtained, but she was treated with 3 days of Bactrim. Per pt, her symptoms improved until about 2 weeks ago when she began to develop increased urinary urgency and frequency with dysuria. She also endorses cloudy urine with a foul smell. She denies any blood in her urine.   She denies fevers, chills, headaches, vision loss, SOB, abd pain, N/V/D/C, or peripheral edema.    Past Medical History  Diagnosis Date  . GERD (gastroesophageal reflux disease)     Papulous gastropathy EGD Oct. 2007  . Hypertension   . Low back pain     intermittent radiculopathy  . Osteoarthritis   . Pulmonary embolism     2/2 knee replacement  . Osteopenia     T score-1.1 R Fremur, - 0.4 L spine  . Hyperlipemia   . Polio 1942  . Urinary incontinence   . IBS (irritable bowel syndrome)    Current Outpatient Prescriptions  Medication Sig Dispense Refill  . aspirin 81 MG chewable tablet Chew 81 mg by mouth daily.        . B Complex-Biotin-FA (B-50 COMPLEX PO) Take 1 tablet by mouth daily.      . diclofenac sodium (VOLTAREN) 1 % GEL Apply 2 g topically 4 (four) times daily.  100 g  1  . fish oil-omega-3 fatty acids 1000 MG capsule Take 2 g by mouth daily.      . fluocinonide ointment (LIDEX) 0.05 % Apply 1 application topically daily. Left foot      . hydrochlorothiazide (HYDRODIURIL) 25 MG tablet Take 1 tablet (25 mg total) by mouth daily.  30 tablet  2  . HYDROcodone-acetaminophen (NORCO/VICODIN) 5-325 MG per tablet Take one tablet by mouth every 8 hours as needed for knee pain  90 tablet  0  . lisinopril  (PRINIVIL,ZESTRIL) 40 MG tablet TAKE 1 TABLET BY MOUTH ONCE DAILY.  30 tablet  11  . NEXIUM 40 MG capsule TAKE 1 CAPSULE BY MOUTH DAILY BEFORE BREAKFAST.  30 capsule  11  . nitroGLYCERIN (NITROSTAT) 0.4 MG SL tablet Place 0.4 mg under the tongue every 5 (five) minutes as needed for chest pain.      . polyethylene glycol (MIRALAX / GLYCOLAX) packet Take 17 g by mouth daily as needed.      Marland Kitchen. PROCTOSOL HC 2.5 % rectal cream       . sulfamethoxazole-trimethoprim (BACTRIM DS) 800-160 MG per tablet Take 1 tablet by mouth 2 (two) times daily.  6 tablet  0  . traZODone (DESYREL) 50 MG tablet 50 mg at bedtime as needed.      . zoster vaccine live, PF, (ZOSTAVAX) [id number] UNT/0.65ML injection Inject 19,400 Units into the skin once.  1 each  0  . [DISCONTINUED] simvastatin (ZOCOR) 20 MG tablet Take 20 mg by mouth every evening.       No current facility-administered medications for this visit.   Family History  Problem Relation Age of Onset  . Cancer Daughter     colon caner, <60  . Cancer Mother  lung   History   Social History  . Marital Status: Legally Separated    Spouse Name: N/A    Number of Children: 7  . Years of Education: N/A   Occupational History  . 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 Smoking status: Former Games developermoker  . Smokeless tobacco: Not on file  . Alcohol Use: No  . Drug Use: No  . Sexual Activity: Not on file   Other Topics Concern  . Not on file   Social History Narrative   [facility]   Review of Systems: A 12 point ROS was performed; pertinent positives and negatives were noted in the HPI   Objective:  Physical Exam: Filed Vitals:   10/22/13 1545  BP: 152/78  Pulse: 71  Temp: 98.5 F (36.9 C)  TempSrc: Oral  SpO2: 99%   Constitutional: Vital signs reviewed.  Patient is a well-developed and well-nourished female in no acute distress and cooperative with exam. Alert and oriented x3.   Head: Normocephalic and atraumatic Eyes: PERRL, EOMI.  Cardiovascular: RRR, no MRG Pulmonary/Chest: Normal respiratory effort, CTAB, no wheezes, rales, or rhonchi Abdominal: Soft. Non-tender, non-distended, no guarding present. +TTP in pelvis over bladder. GU: No CVA tenderness.  Musculoskeletal: Knees with evidence of TKR, incisions well healed. No pitting edema in BLE. Neurological: A&O x3, Strength is normal and symmetric in BU&LE (5/5), cranial nerve II-XII are grossly intact, no focal deficits.  Skin: Warm, dry and intact.   Psychiatric: Normal mood and affect. speech and behavior is normal.   Assessment & Plan:   Please refer to Problem List based Assessment and Plan

## 2013-10-22 NOTE — Assessment & Plan Note (Addendum)
Dysuria, urgency, frequency, incontinency with cloudy, foul smelling urine the restarted approx 2 weeks after completing Bactrim therapy. With her age, avoiding Macrobid. Unsure that fosfomycin would be effective given failure with Bactrim. No antibiotic allergies. Urine dipstick +leukocytes. Per UpToDate, Cipro adjustment for renal function for CrCl<50. Pt's CrCl 65.4.  - Checking UA and Ucx. - Starting Cipro 500mg  BID x7 days - Pt provided with information on Cipro, which includes adverse reactions, including achilles tendon rupture.

## 2013-10-23 LAB — URINALYSIS, ROUTINE W REFLEX MICROSCOPIC
Bilirubin Urine: NEGATIVE
Glucose, UA: NEGATIVE mg/dL
Hgb urine dipstick: NEGATIVE
Ketones, ur: NEGATIVE mg/dL
LEUKOCYTES UA: NEGATIVE
NITRITE: NEGATIVE
PROTEIN: NEGATIVE mg/dL
Specific Gravity, Urine: 1.029 (ref 1.005–1.030)
UROBILINOGEN UA: 0.2 mg/dL (ref 0.0–1.0)
pH: 5 (ref 5.0–8.0)

## 2013-10-23 LAB — URINE CULTURE
Colony Count: NO GROWTH
Organism ID, Bacteria: NO GROWTH

## 2013-10-23 NOTE — Progress Notes (Signed)
INTERNAL MEDICINE TEACHING ATTENDING ADDENDUM - Jakobee Brackins, MD: I reviewed and discussed at the time of visit with the resident Dr. Glenn, the patient's medical history, physical examination, diagnosis and results of pertinent tests and treatment and I agree with the patient's care as documented.  

## 2013-10-31 ENCOUNTER — Telehealth: Payer: Self-pay | Admitting: Internal Medicine

## 2013-10-31 DIAGNOSIS — I10 Essential (primary) hypertension: Secondary | ICD-10-CM

## 2013-10-31 NOTE — Telephone Encounter (Signed)
Ordered BP cuff for home.

## 2013-11-01 NOTE — Telephone Encounter (Signed)
Thank you.  Faxing to [facility].

## 2013-11-20 ENCOUNTER — Encounter: Payer: Self-pay | Admitting: Internal Medicine

## 2013-11-20 ENCOUNTER — Ambulatory Visit (HOSPITAL_COMMUNITY)
Admission: RE | Admit: 2013-11-20 | Discharge: 2013-11-20 | Disposition: A | Discharge status: Home / Self Care | Payer: PRIVATE HEALTH INSURANCE | Source: Ambulatory Visit | Attending: Internal Medicine | Admitting: Internal Medicine

## 2013-11-20 ENCOUNTER — Ambulatory Visit (INDEPENDENT_AMBULATORY_CARE_PROVIDER_SITE_OTHER): Payer: PRIVATE HEALTH INSURANCE | Admitting: Internal Medicine

## 2013-11-20 ENCOUNTER — Other Ambulatory Visit: Payer: Self-pay | Admitting: Internal Medicine

## 2013-11-20 VITALS — BP 138/69 | HR 46 | Temp 97.8°F | Ht 64.0 in | Wt 203.5 lb

## 2013-11-20 DIAGNOSIS — R001 Bradycardia, unspecified: Secondary | ICD-10-CM

## 2013-11-20 DIAGNOSIS — M159 Polyosteoarthritis, unspecified: Secondary | ICD-10-CM

## 2013-11-20 DIAGNOSIS — M19041 Primary osteoarthritis, right hand: Secondary | ICD-10-CM | POA: Insufficient documentation

## 2013-11-20 DIAGNOSIS — M79641 Pain in right hand: Secondary | ICD-10-CM | POA: Diagnosis present

## 2013-11-20 DIAGNOSIS — M85841 Other specified disorders of bone density and structure, right hand: Secondary | ICD-10-CM | POA: Diagnosis not present

## 2013-11-20 DIAGNOSIS — I10 Essential (primary) hypertension: Secondary | ICD-10-CM

## 2013-11-20 LAB — TSH: TSH: 1.203 u[IU]/mL (ref 0.350–4.500)

## 2013-11-20 LAB — BASIC METABOLIC PANEL WITH GFR
BUN: 17 mg/dL (ref 6–23)
CHLORIDE: 104 meq/L (ref 96–112)
CO2: 24 mEq/L (ref 19–32)
CREATININE: 0.99 mg/dL (ref 0.50–1.10)
Calcium: 9.9 mg/dL (ref 8.4–10.5)
GFR, Est African American: 62 mL/min
GFR, Est Non African American: 54 mL/min — ABNORMAL LOW
GLUCOSE: 79 mg/dL (ref 70–99)
Potassium: 4.5 mEq/L (ref 3.5–5.3)
Sodium: 139 mEq/L (ref 135–145)

## 2013-11-20 MED ORDER — HYDROCODONE-ACETAMINOPHEN 5-325 MG PO TABS: ORAL_TABLET | ORAL | Status: DC

## 2013-11-20 NOTE — Patient Instructions (Addendum)
General Instructions: Please follow up in 2 to 4 weeks  We may refer you to the cardiologist for slow heart rate.   We will check labs today Take care    Treatment Goals:  Goals (1 Years of Data) as of 11/20/13          As of Today 10/22/13 09/11/13 07/29/13 07/22/13     Blood Pressure   . Blood Pressure < 150/90  163/57 152/78 131/70 133/71 180/92     Result Component   . LDL CALC < 130      140      Progress Toward Treatment Goals:  Treatment Goal 11/20/2013  Blood pressure deteriorated    Self Care Goals & Plans:  Self Care Goal 11/20/2013  Manage my medications take my medicines as prescribed; bring my medications to every visit; refill my medications on time; follow the sick day instructions if I am sick  Monitor my health keep track of my blood pressure  Eat healthy foods drink diet soda or water instead of juice or soda; eat more vegetables; eat foods that are low in salt; eat baked foods instead of fried foods; eat fruit for snacks and desserts; eat smaller portions  Be physically active find an activity I enjoy  Meeting treatment goals maintain the current self-care plan    No flowsheet data found.   Care Management & Community Referrals:  Referral 11/20/2013  Referrals made for care management support none needed  Referrals made to community resources none       Bradycardia Bradycardia is a term for a heart rate (pulse) that, in adults, is slower than 60 beats per minute. A normal rate is 60 to 100 beats per minute. A heart rate below 60 beats per minute may be normal for some adults with healthy hearts. If the rate is too slow, the heart may have trouble pumping the volume of blood the body needs. If the heart rate gets too low, blood flow to the brain may be decreased and may make you feel lightheaded, dizzy, or faint. The heart has a natural pacemaker in the top of the heart called the SA node (sinoatrial or sinus node). This pacemaker sends out regular  electrical signals to the muscle of the heart, telling the heart muscle when to beat (contract). The electrical signal travels from the upper parts of the heart (atria) through the AV node (atrioventricular node), to the lower chambers of the heart (ventricles). The ventricles squeeze, pumping the blood from your heart to your lungs and to the rest of your body. CAUSES   Problem with the heart's electrical system.  Problem with the heart's natural pacemaker.  Heart disease, damage, or infection.  Medications.  Problems with minerals and salts (electrolytes). SYMPTOMS   Fainting (syncope).  Fatigue and weakness.  Shortness of breath (dyspnea).  Chest pain (angina).  Drowsiness.  Confusion. DIAGNOSIS   An electrocardiogram (ECG) can help your caregiver determine the type of slow heart rate you have.  If the cause is not seen on an ECG, you may need to wear a heart monitor that records your heart rhythm for several hours or days.  Blood tests. TREATMENT   Electrolyte supplements.  Medications.  Withholding medication which is causing a slow heart rate.  Pacemaker placement. SEEK IMMEDIATE MEDICAL CARE IF:   You feel lightheaded or faint.  You develop an irregular heart rate.  You feel chest pain or have trouble breathing. MAKE SURE YOU:   Understand these  instructions.  Will watch your condition.  Will get help right away if you are not doing well or get worse. Document Released: 09/18/2001 Document Revised: 03/21/2011 Document Reviewed: 04/03/2013 [facility] Patient Information 2015 [employer], [place]. This information is not intended to replace advice given to you by your health care provider. Make sure you discuss any questions you have with your health care provider.

## 2013-11-20 NOTE — Progress Notes (Signed)
   Subjective:    Patient ID: Jennifer Owen, female    DOB: 09-Apr-1931, 80 y.o.   MRN: [id number]  HPI Comments: 80 y.o PMH HLD, HTN, GERD, constipation, OA  She presents for f/u  1. Needs Rx refill of Vicodin 5-325 taking qd to bid prn for jt pains 2. Right hand pain>left hand pain.  Hand pain is all over, joints more swollen in the right hand.  She tried a brace on hand and no relief.  Pain is relieved a little with Tylenol since ran out of Vicodin. Pain is worse all day nothing particularly makes it worse and is better with narcotic medication and rubbing alcohol on her hand.   3. HR 46 today w/o sx's such as lightheaded or dizziness but has variable periods where she has fatigue.  Denies CP today, does have palpitations sometimes if she is rushing.  4. HTN BP 163/57 today was rushing so did not take HCTZ 25 today or Lisinopril 40 mg today       Review of Systems  Respiratory: Negative for shortness of breath.   Cardiovascular: Negative for chest pain and leg swelling.  Gastrointestinal: Negative for constipation.  Genitourinary: Negative for dysuria.       Objective:   Physical Exam  Constitutional: She is oriented to person, place, and time. She appears well-developed and well-nourished. She is cooperative. No distress.  HENT:  Head: Normocephalic and atraumatic.  Mouth/Throat: No oropharyngeal exudate.  Eyes: Conjunctivae are normal. Right eye exhibits no discharge. Left eye exhibits no discharge. No scleral icterus.  Cardiovascular: Regular rhythm, S1 normal, S2 normal and normal heart sounds.  Bradycardia present.   No murmur heard. Pulmonary/Chest: Effort normal and breath sounds normal. No respiratory distress. She has no wheezes.  Abdominal: Soft. Bowel sounds are normal. There is no tenderness.  Musculoskeletal:  MCP joints with deformity right hand worse than left hand  Neurological: She is alert and oriented to person, place, and time. Gait normal.  Skin: Skin  is warm and dry. No rash noted. She is not diaphoretic.  Psychiatric: She has a normal mood and affect. Her speech is normal and behavior is normal. Judgment and thought content normal. Cognition and memory are normal.  Nursing note and vitals reviewed.         Assessment & Plan:  F/u in 1 month, sooner if needed

## 2013-11-20 NOTE — Assessment & Plan Note (Addendum)
Asymptomatic  Will check BMET, TSH, EKG today confirmed HR 50 Referred to cardiology if after recheck vitals still bradycardic and + Given info to read

## 2013-11-20 NOTE — Addendum Note (Signed)
Addended by: Annett GulaMCLEAN, Jarissa Sheriff N on: 11/20/2013 09:54 AM   Modules accepted: Orders, Level of Service

## 2013-11-20 NOTE — Assessment & Plan Note (Signed)
Rx refill of narcotic today x 1 month, will get PCP to fill for 2 more months  Xray right hand today Continue exercises, narcotics, voltaren gel prn

## 2013-11-20 NOTE — Assessment & Plan Note (Signed)
BP Readings from Last 3 Encounters:  11/20/13 163/57  10/22/13 152/78  09/11/13 131/70    Lab Results  Component Value Date   NA 134* 06/07/2013   K 4.2 06/07/2013   CREATININE 1.00 06/07/2013    Assessment: Blood pressure control: mildly elevated Progress toward BP goal:  deteriorated Comments: did not take BP meds this am  Plan: Medications:  continue current medications (Lisinopril 40 mg qd, HCTZ 25 mg qd)  Educational resources provided: brochure Self management tools provided:   Other plans: check BMET today, f/u in 1 month

## 2013-11-21 ENCOUNTER — Encounter: Payer: Self-pay | Admitting: Internal Medicine

## 2013-11-22 ENCOUNTER — Encounter: Payer: Self-pay | Admitting: Internal Medicine

## 2013-11-24 NOTE — Progress Notes (Signed)
INTERNAL MEDICINE TEACHING ATTENDING ADDENDUM - Lawayne Hartig, MD: I reviewed and discussed at the time of visit with the resident Dr. McLean, the patient's medical history, physical examination, diagnosis and results of pertinent tests and treatment and I agree with the patient's care as documented.  

## 2013-11-27 ENCOUNTER — Encounter: Payer: Self-pay | Admitting: *Deleted

## 2013-11-27 ENCOUNTER — Encounter: Payer: Self-pay | Admitting: Cardiology

## 2013-11-27 ENCOUNTER — Telehealth: Payer: Self-pay | Admitting: *Deleted

## 2013-11-27 ENCOUNTER — Ambulatory Visit (INDEPENDENT_AMBULATORY_CARE_PROVIDER_SITE_OTHER): Payer: PRIVATE HEALTH INSURANCE | Admitting: Cardiology

## 2013-11-27 VITALS — BP 140/82 | HR 76 | Ht 64.0 in | Wt 200.0 lb

## 2013-11-27 DIAGNOSIS — R001 Bradycardia, unspecified: Secondary | ICD-10-CM

## 2013-11-27 NOTE — Telephone Encounter (Signed)
Pt was seen in clinic on 11/11 for right hand pain.  Xrays were taken but pt has not heard back. She is still having pain and wanted the report of her Xray.  Please call her at [phone]

## 2013-11-27 NOTE — Progress Notes (Signed)
[address] [place], [place]  [address] Phone: [phone] Fax:  [phone]  Date:  11/27/2013   ID:  Jennifer Owen, DOB [birth year], MRN [id number]  PCP:  Hyacinth Meeker, MD   History of Present Illness: Jennifer Owen is a 80 y.o. female evaluation of bradycardia heart rate was 46 bpm with no complaints. Repeat EKG showed heart rate of 50 with PVC. Asymptomatic, no syncope, no chest pain, no shortness of breath. She did state that she went to church 3 days in a row and on the last day she was feeling poorly. Overall doing well. She ambulates with a cane.  She takes no AV nodal blocking agents.   Wt Readings from Last 3 Encounters:  11/27/13 200 lb (90.719 kg)  11/20/13 203 lb 8 oz (92.307 kg)  10/22/13 207 lb (93.895 kg)     Past Medical History  Diagnosis Date  . GERD (gastroesophageal reflux disease)     Papulous gastropathy EGD Oct. 2007  . Hypertension   . Low back pain     intermittent radiculopathy  . Osteoarthritis   . Pulmonary embolism     2/2 knee replacement  . Osteopenia     T score-1.1 R [place], - 0.4 L spine  . Hyperlipemia   . Polio 1942  . Urinary incontinence   . IBS (irritable bowel syndrome)     Past Surgical History  Procedure Laterality Date  . Joint replacement      total knee - left 1995, right 2004  . Abdominal hysterectomy  1985  . Oophorectomy  1985    Current Outpatient Prescriptions  Medication Sig Dispense Refill  . aspirin 81 MG chewable tablet Chew 81 mg by mouth daily.      . B Complex-Biotin-FA (B-50 COMPLEX PO) Take 1 tablet by mouth daily.    . fish oil-omega-3 fatty acids 1000 MG capsule Take 2 g by mouth daily.    . hydrochlorothiazide (HYDRODIURIL) 25 MG tablet Take 1 tablet (25 mg total) by mouth daily. 30 tablet 2  . HYDROcodone-acetaminophen (NORCO/VICODIN) 5-325 MG per tablet Take one tablet by mouth every 8 hours as needed for knee pain 90 tablet 0  . lisinopril (PRINIVIL,ZESTRIL) 40 MG  tablet TAKE 1 TABLET BY MOUTH ONCE DAILY. 30 tablet 11  . NEXIUM 40 MG capsule TAKE 1 CAPSULE BY MOUTH DAILY BEFORE BREAKFAST. 30 capsule 11  . nitroGLYCERIN (NITROSTAT) 0.4 MG SL tablet Place 0.4 mg under the tongue every 5 (five) minutes as needed for chest pain.    . traZODone (DESYREL) 50 MG tablet 50 mg at bedtime as needed.    . zoster vaccine live, PF, (ZOSTAVAX) 69629 UNT/0.65ML injection Inject 19,400 Units into the skin once. 1 each 0  . [DISCONTINUED] simvastatin (ZOCOR) 20 MG tablet Take 20 mg by mouth every evening.     No current facility-administered medications for this visit.    Allergies:    Allergies  Allergen Reactions  . Tramadol Hcl Nausea Only    stomach irritation    Social History:  The patient  reports that she has quit smoking. She does not have any smokeless tobacco history on file. She reports that she does not drink alcohol or use illicit drugs.   Family History  Problem Relation Age of Onset  . Cancer Daughter     colon caner, <60  . Cancer Mother     lung    ROS:  Please see the  history of present illness.   No syncope, no shortness of breath, no chest pain, no excessive dizziness. No stroke symptoms, no bleeding.   All other systems reviewed and negative.   PHYSICAL EXAM: VS:  BP 140/82 mmHg  Pulse 76  Ht 5\' 4"  (1.626 m)  Wt 200 lb (90.719 kg)  BMI 34.31 kg/m2 Well nourished, well developed, in no acute distress HEENT: normal, [place]/AT, EOMI Neck: no JVD, normal carotid upstroke, no bruit Cardiac:  normal S1, S2; RRR; no murmur Lungs:  clear to auscultation bilaterally, no wheezing, rhonchi or rales Abd: soft, nontender, no hepatomegaly, no bruitsoverweight Ext: no edema, 2+ distal pulses Skin: warm and dry GU: deferred Neuro: no focal abnormalities noted, AAO x 3  EKG:  11/20/13-Sinus bradycardia 50 with PVC.    Labs: TSH normal, electrolytes normal, CBC normal ASSESSMENT AND PLAN:  1. Sinus bradycardia-asymptomatic. It is possible that  prior pulse was lower than expected because of PVCs. Continue to avoid AV nodal blocking agent such as metoprolol or diltiazem. We discussed the possible needs for pacemaker in the future if she becomes symptomatic, syncope for instance. For now, conservative management. Her heart rate increased with mild activity. 2. Obesity-encourage weight loss. 3. Hypertension continue with current medication, ACE inhibitor. 4. Hyperlipidemia-LDL 140-continue with statin. 5. We'll see back in 6 months.  Signed, Donato Schultz, MD [facility]  11/27/2013 4:03 PM

## 2013-11-27 NOTE — Patient Instructions (Signed)
The current medical regimen is effective;  continue present plan and medications.  Follow up in 6 months with Dr. Skains.  You will receive a letter in the mail 2 months before you are due.  Please call us when you receive this letter to schedule your follow up appointment.  

## 2013-11-29 NOTE — Telephone Encounter (Signed)
Called her and let her know her xray looks fine. She says she still feels numb. Asked her to come back for re-eval.

## 2013-12-02 ENCOUNTER — Encounter: Payer: PRIVATE HEALTH INSURANCE | Admitting: Internal Medicine

## 2013-12-17 ENCOUNTER — Other Ambulatory Visit: Payer: Self-pay | Admitting: *Deleted

## 2013-12-18 MED ORDER — NITROGLYCERIN 0.4 MG SL SUBL: 0.4000 mg | SUBLINGUAL_TABLET | SUBLINGUAL | Status: DC | PRN

## 2013-12-23 ENCOUNTER — Encounter: Payer: Self-pay | Admitting: Sports Medicine

## 2013-12-23 ENCOUNTER — Ambulatory Visit (INDEPENDENT_AMBULATORY_CARE_PROVIDER_SITE_OTHER): Payer: PRIVATE HEALTH INSURANCE | Admitting: Sports Medicine

## 2013-12-23 VITALS — BP 173/88 | Ht 65.0 in | Wt 210.0 lb

## 2013-12-23 DIAGNOSIS — M5441 Lumbago with sciatica, right side: Secondary | ICD-10-CM

## 2013-12-23 NOTE — Progress Notes (Signed)
Patient ID: Jennifer Owen, female   DOB: 12-22-1931, 80 y.o.   MRN: [id number]  Subjective: Patient presents today for follow-up on bilateral leg pain. This is the same pain that she was experiencing back in June that responded well to bilateral SI joint injections. The pain is worse in the left leg. The pain is localized to the lower lateral leg with radiation up towards the lateral hip. The pain is described as achy. There is some associated numbness and tingling in the same distribution. The pain has been worsening for the past month and her injections were done approximately 6 months ago. The pain does seem worse with prolonged sitting and when trying to roll over in bed. She remains active in water aerobics and her pain is not preventing her from doing this. She continues to take hydrocodone when necessary but they are less effective currently.  She has no fever, chills, night sweats, incontinence, or weight loss.  Patient also complains of some right hand pain for the past 2 months. Pain is achy and seems worse with weather changes. There is no gross swelling or deformity. She has been using linimet oil which seems to help.  Objective: Gen.: Calm and cooperative no acute distress HEENT: [place]/AT, conjunctiva clear, sclera anicteric Respiratory: Breathing nonlabored Musculoskeletal:  Hands: mild TTP over DIP>PIP/MCP joints of right hand without swelling or deformity  Back/Hip  Tender to palpation over bilateral SI joints left greater than right  Palpation over left SI joint reproduces left lateral leg pain.  Internal rotation of left hip normal, internal rotation of right hip normal  Midline tenderness over L3-L5  Negative seated straight leg raise  Patellar and Achilles reflexes intact, slightly diminished, but equal bilaterally  Lower extremity strength intact  FADIR negative  FABER negative  Assessment/Plan: Patient is a 80 year old female with atypical SI joint pain and right hand  pain.  1. Atypical SI joint pain 2. Osteoarthritis Right Hand   Her pain location is atypical for SI joint dysfunction but will she did respond very well to prior SI joint injections. Will refer back to for repeat bilateral fluoroscopic SI joint injections. Patient's body habitus prevents landmarked guided injection. She should continue her water aerobics. Her right hand pain is most consistent with osteoarthritis. There are no features of gout or inflammatory arthritis. She can follow-up as needed.  Patient seen and discussed with Dr. Margaretha Sheffieldraper.  Mickle PlumbEvan Lutz PGY-3 Family Medicine

## 2013-12-25 ENCOUNTER — Ambulatory Visit: Payer: Medicare Other | Admitting: Podiatrist

## 2013-12-30 ENCOUNTER — Encounter: Payer: PRIVATE HEALTH INSURANCE | Admitting: Internal Medicine

## 2014-01-07 ENCOUNTER — Other Ambulatory Visit: Payer: Self-pay | Admitting: Sports Medicine

## 2014-01-07 DIAGNOSIS — M5441 Lumbago with sciatica, right side: Secondary | ICD-10-CM

## 2014-01-14 ENCOUNTER — Other Ambulatory Visit: Payer: Self-pay | Admitting: *Deleted

## 2014-01-15 ENCOUNTER — Other Ambulatory Visit: Payer: Self-pay | Admitting: *Deleted

## 2014-01-15 ENCOUNTER — Other Ambulatory Visit: Payer: Self-pay | Admitting: Sports Medicine

## 2014-01-15 DIAGNOSIS — M5442 Lumbago with sciatica, left side: Secondary | ICD-10-CM

## 2014-01-15 DIAGNOSIS — M5441 Lumbago with sciatica, right side: Secondary | ICD-10-CM

## 2014-01-15 MED ORDER — NEXIUM 40 MG PO CPDR: DELAYED_RELEASE_CAPSULE | ORAL | Status: DC

## 2014-01-16 ENCOUNTER — Encounter: Payer: Self-pay | Admitting: Podiatrist

## 2014-01-16 ENCOUNTER — Ambulatory Visit (INDEPENDENT_AMBULATORY_CARE_PROVIDER_SITE_OTHER): Payer: Medicare Other | Admitting: Podiatrist

## 2014-01-16 DIAGNOSIS — M79676 Pain in unspecified toe(s): Secondary | ICD-10-CM

## 2014-01-16 DIAGNOSIS — B351 Tinea unguium: Secondary | ICD-10-CM

## 2014-01-17 ENCOUNTER — Ambulatory Visit
Admission: RE | Admit: 2014-01-17 | Discharge: 2014-01-17 | Disposition: A | Discharge status: Home / Self Care | Payer: Medicare Other | Source: Ambulatory Visit | Attending: Sports Medicine | Admitting: Sports Medicine

## 2014-01-17 ENCOUNTER — Other Ambulatory Visit: Payer: Self-pay | Admitting: Sports Medicine

## 2014-01-17 DIAGNOSIS — M5441 Lumbago with sciatica, right side: Secondary | ICD-10-CM

## 2014-01-17 MED ORDER — IOHEXOL 180 MG/ML  SOLN: 1.0000 mL | Freq: Once | INTRAMUSCULAR | Status: AC | PRN

## 2014-01-17 MED ORDER — METHYLPREDNISOLONE ACETATE 40 MG/ML INJ SUSP (RADIOLOG
120.0000 mg | Freq: Once | INTRAMUSCULAR | Status: AC
  Administered 2014-01-17: 120 mg via INTRA_ARTICULAR

## 2014-01-17 NOTE — Progress Notes (Signed)
HPI:  Patient presents today for follow up of foot and nail care. Relates pain in her right great toenail today more so than usual.   Objective:  Patients chart is reviewed.  Vascular status reveals pedal pulses noted at 1 out of 4 dp and pt bilateral .  Neurological sensation is intact to [facility]il[facility] bilateral.  Patients nails are thickened, discolored, distrophic, friable and brittle with yellow-brown discoloration. Patient subjectively relates they are painful with shoes and with ambulation of bilateral feet. No redness, no swelling, no sign of infection is present right great toenail. Toenail curves at the distal tip the toe and likely is causing pain with shoe gear pressure.  Assessment:  Symptomatic onychomycosis  Plan:  Discussed treatment options and alternatives.  The symptomatic toenails were debrided through manual an mechanical means without complication.  Return appointment recommended at routine intervals of 3 months

## 2014-01-22 ENCOUNTER — Encounter: Payer: Self-pay | Admitting: Internal Medicine

## 2014-01-22 ENCOUNTER — Ambulatory Visit (INDEPENDENT_AMBULATORY_CARE_PROVIDER_SITE_OTHER): Payer: Medicare Other | Admitting: Internal Medicine

## 2014-01-22 VITALS — BP 163/65 | HR 56 | Temp 98.0°F | Ht 64.0 in | Wt 208.6 lb

## 2014-01-22 DIAGNOSIS — M159 Polyosteoarthritis, unspecified: Secondary | ICD-10-CM

## 2014-01-22 DIAGNOSIS — I10 Essential (primary) hypertension: Secondary | ICD-10-CM

## 2014-01-22 MED ORDER — NITROGLYCERIN 0.4 MG SL SUBL: 0.4000 mg | SUBLINGUAL_TABLET | SUBLINGUAL | Status: DC | PRN

## 2014-01-22 NOTE — Patient Instructions (Addendum)
Please continue taking your medications as prescribed. Use the voltaren gel for your hand pain and the pain pills twice a day as you have been to control the pain in your legs (along with the joint injections).  Your nitrostat was refilled today.    General Instructions:   Please bring your medicines with you each time you come to clinic.  Medicines may include prescription medications, over-the-counter medications, herbal remedies, eye drops, vitamins, or other pills.   Progress Toward Treatment Goals:  Treatment Goal 11/20/2013  Blood pressure deteriorated    Self Care Goals & Plans:  Self Care Goal 01/22/2014  Manage my medications take my medicines as prescribed; bring my medications to every visit; refill my medications on time  Monitor my health -  Eat healthy foods eat foods that are low in salt; eat more vegetables  Be physically active find an activity I enjoy  Meeting treatment goals -    No flowsheet data found.   Care Management & Community Referrals:  Referral 11/20/2013  Referrals made for care management support none needed  Referrals made to community resources none

## 2014-01-23 NOTE — Progress Notes (Signed)
Subjective:    Patient ID: Jennifer Owen, female    DOB: [birth year], 81 y.o.   MRN: [id number]  HPI Jennifer Owen is an 81 yo woman with a history of HLD, HTN, GERD, constipation and OA who presents for medication refills. For the past 6 months, she has been visiting Dr. Margaretha Sheffield for SI joint injections to help control the pain in her legs. These injections have helped markedly; she received her last on 01/17/14. On her last [facility] visit, she was also bothered by bilateral joint pain in her hands (R>L). This pain is much reduced with the addition of voltaren gel. She is also on narcotics (Norco) for her osteoarthritis of multiple joints; she has a prescription for Norco TID, but only takes it BID.  The patient has a history of asymptomatic sinus bradycardia; she continues to be asymptomatic with no episodes of syncope or light-headedness. She was referred to Dr. Anne Fu (cardiology). He suggested avoiding AV blocking agents (metoprolol and diltiazem) and potentially considering a pacemaker if she becomes symptomatic. She will have follow up with him in 6 months.   Review of Systems  Constitutional: Negative for chills, activity change, appetite change and fatigue.  HENT: Negative.   Eyes: Negative.   Respiratory: Negative for chest tightness and shortness of breath.   Cardiovascular: Negative for chest pain, palpitations and leg swelling.  Gastrointestinal: Negative.   Genitourinary: Negative.   Musculoskeletal: Positive for back pain.       Hand pain R>L  Skin: Negative for pallor, rash and wound.  Neurological: Negative for dizziness, weakness, light-headedness, numbness and headaches.  Psychiatric/Behavioral: Negative.       BP: 163/65    HR 56    T 98.0    100%RA Objective:   Physical Exam  Constitutional: She is oriented to person, place, and time. She appears well-developed and well-nourished. No distress.  HENT:  Head: Normocephalic and atraumatic.  Eyes: Conjunctivae and  EOM are normal. Pupils are equal, round, and reactive to light.  Neck: Normal range of motion.  Cardiovascular: Normal rate, regular rhythm and normal heart sounds.   No murmur heard. Pulmonary/Chest: Effort normal and breath sounds normal.  Abdominal: Soft. Bowel sounds are normal. There is no tenderness.  Musculoskeletal: She exhibits tenderness. She exhibits no edema.  Tender to palpation or with movement at hips b/l Tender to palpation b/l hands  Neurological: She is alert and oriented to person, place, and time.  Skin: Skin is warm and dry. She is not diaphoretic.  Psychiatric: She has a normal mood and affect.          Assessment & Plan:  Please see problem oriented charting for assessment & plan  Jennifer Chiquito, MD

## 2014-01-23 NOTE — Assessment & Plan Note (Signed)
BP Readings from Last 3 Encounters:  01/22/14 163/65  12/23/13 173/88  11/27/13 140/82    Lab Results  Component Value Date   NA 139 11/20/2013   K 4.5 11/20/2013   CREATININE 0.99 11/20/2013    Assessment: Blood pressure control:  mildly elevated Progress toward BP goal:   deteriorated Comments: patient admits to poor diet (high salt) over holidays  Plan: Medications:  continue current medications lisinopril 40 mg daily, HCTZ 25 mg daily Educational resources provided:  brochure, discussed healthy diet Other plans: f/u in 3 months

## 2014-01-23 NOTE — Assessment & Plan Note (Signed)
Patient's leg and hand pain is greatly helped by her SI joint injections and her voltaren gel. She is also on narcotics (but only takes BID, they are prescribed TID). She also does hand exercises.   - Continue on current pain regimen

## 2014-01-24 NOTE — Progress Notes (Signed)
INTERNAL MEDICINE TEACHING ATTENDING ADDENDUM - Gaylia Kassel, MD: I reviewed and discussed at the time of visit with the resident Dr. Mallory, the patient's medical history, physical examination, diagnosis and results of pertinent tests and treatment and I agree with the patient's care as documented.  

## 2014-02-12 ENCOUNTER — Other Ambulatory Visit: Payer: Self-pay | Admitting: *Deleted

## 2014-02-13 MED ORDER — HYDROCODONE-ACETAMINOPHEN 5-325 MG PO TABS: ORAL_TABLET | ORAL | Status: DC

## 2014-02-13 MED ORDER — TRAZODONE HCL 50 MG PO TABS: 50.0000 mg | ORAL_TABLET | Freq: Every evening | ORAL | Status: DC | PRN

## 2014-02-13 NOTE — Telephone Encounter (Signed)
Filled norco #90 02/13/2014

## 2014-02-13 NOTE — Telephone Encounter (Signed)
Pt informed Rx is ready 

## 2014-03-11 ENCOUNTER — Encounter: Payer: Self-pay | Admitting: Internal Medicine

## 2014-03-11 ENCOUNTER — Ambulatory Visit (INDEPENDENT_AMBULATORY_CARE_PROVIDER_SITE_OTHER): Payer: Medicare Other | Admitting: Internal Medicine

## 2014-03-11 VITALS — BP 203/67 | HR 51 | Temp 98.1°F | Wt 205.3 lb

## 2014-03-11 DIAGNOSIS — M159 Polyosteoarthritis, unspecified: Secondary | ICD-10-CM

## 2014-03-11 DIAGNOSIS — I10 Essential (primary) hypertension: Secondary | ICD-10-CM

## 2014-03-11 MED ORDER — DICLOFENAC SODIUM 1 % TD GEL: 2.0000 g | Freq: Four times a day (QID) | TRANSDERMAL | Status: DC

## 2014-03-11 NOTE — Assessment & Plan Note (Addendum)
BP Readings from Last 3 Encounters:  03/11/14 203/67  01/22/14 163/65  12/23/13 173/88    Lab Results  Component Value Date   NA 139 11/20/2013   K 4.5 11/20/2013   CREATININE 0.99 11/20/2013    Assessment: Blood pressure control: severely elevated (In the setting of severe pain) Progress toward BP goal:  deteriorated Comments: Pt c/o 10/10 pain in her knees and feels that this is causing her blood pressure to increase.   Plan: Medications:  continue current medications Educational resources provided: brochure, handout, video Self management tools provided:   Other plans: Attempted to retake patient's blood pressure prior to her leaving the clinic given its elevation. Unfortunately she left before her blood pressure was able to be rechecked. Suspect increased BP 2/2 pain.   Addendum: 03/14/14 I spoke to Mrs. Juanetta GoslingHawkins and she has been checking her blood pressure daily. Her blood pressure is greatly improved and was 136/80 this morning. She was asked to continue to check her blood pressures daily and to call the clinic if they become more elevated.

## 2014-03-11 NOTE — Progress Notes (Signed)
Patient ID: Jennifer Owen, female   DOB: August 22, 1931, 81 y.o.   MRN: [id number]  Subjective:   Patient ID: Jennifer Owen female   DOB: 1931/04/12 81 y.o.   MRN: [id number]  HPI: Jennifer Owen is a 81 y.o. F w/ PMH HLD, HTN, GERD, constipation and OA presents for an acute visit for hand pain and BLE edema.   She has been seeing Dr. Margaretha Sheffield for her OA and had been receiving SI steroid injections which have helped her pain in the past, but her last injection was in December. She had also been using voltaren gel for pain in her hand which had been working but she ran out. She also has Vicodin, which she states is not helping her pain much. Today her pain is 10/10.    Past Medical History  Diagnosis Date  . GERD (gastroesophageal reflux disease)     Papulous gastropathy EGD Oct. 2007  . Hypertension   . Low back pain     intermittent radiculopathy  . Osteoarthritis   . Pulmonary embolism     2/2 knee replacement  . Osteopenia     T score-1.1 R Fremur, - 0.4 L spine  . Hyperlipemia   . Polio 1942  . Urinary incontinence   . IBS (irritable bowel syndrome)    Current Outpatient Prescriptions  Medication Sig Dispense Refill  . aspirin 81 MG chewable tablet Chew 81 mg by mouth daily.      . B Complex-Biotin-FA (B-50 COMPLEX PO) Take 1 tablet by mouth daily.    . fish oil-omega-3 fatty acids 1000 MG capsule Take 2 g by mouth daily.    . hydrochlorothiazide (HYDRODIURIL) 25 MG tablet Take 1 tablet (25 mg total) by mouth daily. 30 tablet 2  . HYDROcodone-acetaminophen (NORCO/VICODIN) 5-325 MG per tablet Take one tablet by mouth every 8 hours as needed for knee pain 90 tablet 0  . lisinopril (PRINIVIL,ZESTRIL) 40 MG tablet TAKE 1 TABLET BY MOUTH ONCE DAILY. 30 tablet 11  . NEXIUM 40 MG capsule TAKE 1 CAPSULE BY MOUTH DAILY BEFORE BREAKFAST. 30 capsule 11  . nitroGLYCERIN (NITROSTAT) 0.4 MG SL tablet Place 1 tablet (0.4 mg total) under the tongue every 5 (five) minutes as needed for  chest pain. 90 tablet 3  . traZODone (DESYREL) 50 MG tablet Take 1 tablet (50 mg total) by mouth at bedtime as needed. 30 tablet 5  . zoster vaccine live, PF, (ZOSTAVAX) 78295 UNT/0.65ML injection Inject 19,400 Units into the skin once. 1 each 0  . [DISCONTINUED] simvastatin (ZOCOR) 20 MG tablet Take 20 mg by mouth every evening.     No current facility-administered medications for this visit.   Family History  Problem Relation Age of Onset  . Cancer Daughter     colon caner, <60  . Cancer Mother     lung   History   Social History  . Marital Status: Legally Separated    Spouse Name: N/A  . Number of Children: 7  . Years of Education: N/A   Occupational History  . 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 [employer]    Social History Main Topics  . Smoking status: Former Games developer  . Smokeless tobacco: Not on file  . Alcohol Use: No  . Drug Use: No  . Sexual Activity: Not on file   Other Topics Concern  . None   Social History Narrative   [facility]   Review of Systems: Constitutional: Denies  fever, chills,or fatigue.  HEENT: Denies eye pain, hearing loss, neck pain.   Respiratory: Denies SOB, DOE Cardiovascular: Denies chest pain, palpitations. +leg swelling.  Gastrointestinal: Denies nausea, vomiting, abdominal pain.  Genitourinary: Denies dysuria, urgency, frequency Musculoskeletal: +Bilateral knee pain with swelling in the legs and pain in bilateral hands.  Skin: Denies rash or wound.  Neurological: Denies dizziness, syncope, or headaches.  Psychiatric/Behavioral: Denies suicidal ideation, mood changes, confusion.   Objective:  Physical Exam: Filed Vitals:   03/11/14 1037  BP: 203/67  Pulse: 51  Temp: 98.1 F (36.7 C)  TempSrc: Oral  Weight: 205 lb 4.8 oz (93.123 kg)  SpO2: 99%   Constitutional: Vital signs reviewed.  Patient is a well-developed and well-nourished female in no acute distress and cooperative with exam. Alert and oriented  x3.  Head: Normocephalic and atraumatic Mouth: MMM Eyes: PERRL, EOMI, Neck: Trachea midline, normal ROM, Cardiovascular: Regular rate, pulses symmetric and intact bilaterally Pulmonary/Chest: Normal respiratory effort, CTAB, no wheezes, rales, or rhonchi Abdominal: Soft. Non-tender, non-distended Musculoskeletal: Moves all 4 extremities with limited ROM of the knees 2/2 pain. Well healed scaring on anterior knees 2/2 TKR. Mild warmth to the L knee w/o edema, no warmth of R knee. Trace edema to BLE.  Neurological: A&O x3, cranial nerve II-XII are grossly intact, no focal motor deficit.  Skin: Warm, dry and intact.   Psychiatric: Normal mood and affect. Speech and behavior is normal.    Assessment & Plan:   Please refer to Problem List based Assessment and Plan

## 2014-03-11 NOTE — Addendum Note (Signed)
Addended by: Charlsie MerlesGLENN, Khalidah Herbold F on: 03/11/2014 11:59 AM   Modules accepted: Level of Service

## 2014-03-11 NOTE — Patient Instructions (Signed)
**  Apply a thin layer of Voltaren gel to your hands and knees 3-4 times a day as needed for pain.   Please be sure to call Dr. Margaretha Sheffieldraper and your Orthopedist for further evaluation of your knee pain.   Please call the clinic if the pain and swelling worsens.  General Instructions:   Thank you for bringing your medicines today. This helps us keep you safe from mistakes.   Progress Toward Treatment Goals:  Treatment Goal 11/20/2013  Blood pressure deteriorated    Self Care Goals & Plans:  Self Care Goal 03/11/2014  Manage my medications bring my medications to every visit; refill my medications on time; take my medicines as prescribed; follow the sick day instructions if I am sick  Monitor my health keep track of my blood pressure; keep track of my weight  Eat healthy foods eat more vegetables; eat fruit for snacks and desserts; eat baked foods instead of fried foods; eat foods that are low in salt; eat smaller portions  Be physically active find an activity I enjoy  Meeting treatment goals -    No flowsheet data found.   Care Management & Community Referrals:  Referral 11/20/2013  Referrals made for care management support none needed  Referrals made to community resources none

## 2014-03-11 NOTE — Assessment & Plan Note (Addendum)
Pt with 10/10 pain in her knees and hands. She is out of the Voltaren gel, and has not seen Dr. Margaretha Sheffieldraper since December. She is s/p bilateral TKR but has not seen her Orthopedist in a few years. She does use Vicodin without much relief in her pain. She may be to the point where she needs another knee replacement. - Refilling Voltaren gel, which can be applied in a thin layer 3-4 times a day to her hands and knees - Pt advised to call Dr. Margaretha Sheffieldraper - Pt advised to call her Orthopedist regarding her knee pain

## 2014-03-13 ENCOUNTER — Ambulatory Visit (INDEPENDENT_AMBULATORY_CARE_PROVIDER_SITE_OTHER): Payer: Medicare Other | Admitting: Sports Medicine

## 2014-03-13 ENCOUNTER — Encounter: Payer: Self-pay | Admitting: Sports Medicine

## 2014-03-13 VITALS — BP 158/88 | HR 81 | Ht 64.0 in | Wt 205.0 lb

## 2014-03-13 DIAGNOSIS — M47818 Spondylosis without myelopathy or radiculopathy, sacral and sacrococcygeal region: Secondary | ICD-10-CM

## 2014-03-13 DIAGNOSIS — M4698 Unspecified inflammatory spondylopathy, sacral and sacrococcygeal region: Secondary | ICD-10-CM

## 2014-03-13 DIAGNOSIS — M25551 Pain in right hip: Secondary | ICD-10-CM

## 2014-03-13 NOTE — Progress Notes (Signed)
Internal Medicine Clinic Attending  Case discussed with Dr. Sherrine MaplesGlenn soon after the resident saw the patient.  We reviewed the resident's history and exam and pertinent patient test results.  I agree with the assessment, diagnosis, and plan of care documented in the resident's note.  Consider calling patient and asking her to take her BP at home for a week to make sure this is not a concerning trend and is related to pain.

## 2014-03-14 NOTE — Progress Notes (Signed)
Jennifer Owen - 81 y.o. female MRN [id number]  Date of birth: 1931-11-24  SUBJECTIVE:  Including CC & ROS.  Jennifer Owen is an 81 year old African-American female presenting here for follow-up on her chronic bilateral lower extremity leg pain. Patient has been seen multiple times for this issue in the past that was originally thought to be related to degenerative changes in the lumbar spine. After 2 failed epidural injections patient had SI joint injections bilaterally that provided her some significant relief. She's had 2 SI joint injections one in April 2015 and then repeat injection in January 2016. Patient reports the first SI joint injections help significantly however the most recent one in January only lasted for about 2 months. She describes the pain as aching in her bialteral lower legs radiating into the feet with associated numbness and tingling. Pain is worse with prolonged sitting or laying. She has remained active and water aerobics denies any significant pain and discomfort during these activities. She's been treating her pain with intermittent hydrocodone provided by her PCP. She's not taking any anti-inflammatories due to history of GI upset, potential cardiac complications with her history of hypertension hyperlipidemia.  Patient also complains of arthritic pain in her hands which is a soreness worse in the mornings progressively as work better throughout the day. She is previously using Voltaren gel with relief however this medication became too expensive and had to discontinue.   ROS: Review of systems otherwise negative except for information present in HPI  HISTORY: Past Medical, Surgical, Social, and Family History Reviewed & Updated per EMR. Pertinent Historical Findings include: Hypertension Peripheral Venous insufficiency Hyperlipidemia Left knee joint replacement History of PE  DATA REVIEWED: Previous back x-rays from 2015 show multilevel degenerative disc disease at  L1-L5, with stable mild scoliosis. MRI from March 2015 shows diffuse lumbar spondylosis, degenerative changes with broad-based disc disc bulge at multiple levels in bilateral foraminal stenosis at L5-S1, L4-L5, L3-L4, L2-L3.  PHYSICAL EXAM:  VS: BP:(!) 158/88 mmHg  HR:81bpm  TEMP: ( )  RESP:   HT:5\' 4"  (162.6 cm)   WT:205 lb (92.987 kg)  BMI:35.3 LOW BACK EXAM: General: well nourished Skin of LE: warm; dry, no rashes, lesions, ecchymosis or erythema. Vascular: radial pulses 2+ bilaterally Neurologically: Sensation to light touch lower extremities equal and intact bilaterally.  Observation: Normal curvature and no kyphosis or lordosis, no scoliosis.  Iliac crests are symmetric, shoulders line symmetrically Palpation:  No step off defects noted in the thoracic or lumbar spine.   Moderate muscle spasm and tenderness along the paraspinal musculature of the thoracic and lumbar spine Bilateral SI joint tenderness left >Right Range of motion:  Decrease motion in flexion on forward bending, pain with extension Neuromuscular: reproduced radicular pain with straight leg raise left and right Nerve root intervention  L2 and L3: Normal hip flexion with no weakness. L2, L3, L4: Normal hip abduction bilaterally.  Normal patellar DTR +1 bilaterally L4, L5, S1: Normal hip abduction bilaterally S1 and S2: Normal ankle plantar-flexion bilaterally.  Normal Achilles tendon DTR +1 bilaterally L5: Normal extensor hallucis longus bilaterally  ASSESSMENT & PLAN: See problem based charting & AVS for pt instructions. Impression: 81 year old female with atypical SI joint pain and multilevel lumbar degenerative disc disease. Also with bilateral arthritis of the right hand.  Recommendations: -Although patient did not receive a extended response to her most recent SI joint injections she did have clinical response with pain control for at least 2 months. This indicates that her SI joint  are the source of her  radicular symptoms. -I recommend referral back to radiology for repeat fluoroscopy guided SI joint injections 1 additional time. Patient has a good clinical response then we can repeat these every 6 months. -Patient fails to have continued response or poor lengthen response then her management from that point we'll likely just be pain management with her PCPs office. -Encouraged patient to continue aerobic exercise in the pool -Provided patient with information on out-of-pocket cost for Voltaren gel at local pharmacy at 56 dollars given that her insurance no longer covers this prescription.

## 2014-03-22 ENCOUNTER — Emergency Department (HOSPITAL_COMMUNITY)
Admission: EM | Admit: 2014-03-22 | Discharge: 2014-03-22 | Disposition: A | Discharge status: Home / Self Care | Payer: Medicare Other | Attending: Emergency Medicine | Admitting: Emergency Medicine

## 2014-03-22 ENCOUNTER — Encounter (HOSPITAL_COMMUNITY): Payer: Self-pay | Admitting: *Deleted

## 2014-03-22 DIAGNOSIS — I10 Essential (primary) hypertension: Secondary | ICD-10-CM | POA: Insufficient documentation

## 2014-03-22 DIAGNOSIS — Y9289 Other specified places as the place of occurrence of the external cause: Secondary | ICD-10-CM | POA: Insufficient documentation

## 2014-03-22 DIAGNOSIS — Z79899 Other long term (current) drug therapy: Secondary | ICD-10-CM | POA: Diagnosis not present

## 2014-03-22 DIAGNOSIS — Z791 Long term (current) use of non-steroidal anti-inflammatories (NSAID): Secondary | ICD-10-CM | POA: Diagnosis not present

## 2014-03-22 DIAGNOSIS — Y998 Other external cause status: Secondary | ICD-10-CM | POA: Insufficient documentation

## 2014-03-22 DIAGNOSIS — Y9389 Activity, other specified: Secondary | ICD-10-CM | POA: Insufficient documentation

## 2014-03-22 DIAGNOSIS — S0990XA Unspecified injury of head, initial encounter: Secondary | ICD-10-CM

## 2014-03-22 DIAGNOSIS — M1990 Unspecified osteoarthritis, unspecified site: Secondary | ICD-10-CM | POA: Diagnosis not present

## 2014-03-22 DIAGNOSIS — Z7982 Long term (current) use of aspirin: Secondary | ICD-10-CM | POA: Insufficient documentation

## 2014-03-22 DIAGNOSIS — W06XXXA Fall from bed, initial encounter: Secondary | ICD-10-CM | POA: Insufficient documentation

## 2014-03-22 DIAGNOSIS — Z86711 Personal history of pulmonary embolism: Secondary | ICD-10-CM | POA: Insufficient documentation

## 2014-03-22 DIAGNOSIS — Z8719 Personal history of other diseases of the digestive system: Secondary | ICD-10-CM | POA: Diagnosis not present

## 2014-03-22 DIAGNOSIS — Z8619 Personal history of other infectious and parasitic diseases: Secondary | ICD-10-CM | POA: Diagnosis not present

## 2014-03-22 LAB — I-STAT CHEM 8, ED
BUN: 23 mg/dL (ref 6–23)
CHLORIDE: 105 mmol/L (ref 96–112)
CREATININE: 1 mg/dL (ref 0.50–1.10)
Calcium, Ion: 1.32 mmol/L — ABNORMAL HIGH (ref 1.13–1.30)
GLUCOSE: 92 mg/dL (ref 70–99)
HEMATOCRIT: 45 % (ref 36.0–46.0)
Hemoglobin: 15.3 g/dL — ABNORMAL HIGH (ref 12.0–15.0)
POTASSIUM: 4 mmol/L (ref 3.5–5.1)
Sodium: 141 mmol/L (ref 135–145)
TCO2: 25 mmol/L (ref 0–100)

## 2014-03-22 LAB — URINE MICROSCOPIC-ADD ON

## 2014-03-22 LAB — URINALYSIS, ROUTINE W REFLEX MICROSCOPIC
Bilirubin Urine: NEGATIVE
GLUCOSE, UA: NEGATIVE mg/dL
Hgb urine dipstick: NEGATIVE
Ketones, ur: NEGATIVE mg/dL
Nitrite: NEGATIVE
PH: 7 (ref 5.0–8.0)
Protein, ur: NEGATIVE mg/dL
SPECIFIC GRAVITY, URINE: 1.013 (ref 1.005–1.030)
Urobilinogen, UA: 1 mg/dL (ref 0.0–1.0)

## 2014-03-22 MED ORDER — HYDROCODONE-ACETAMINOPHEN 5-325 MG PO TABS
1.0000 | ORAL_TABLET | Freq: Once | ORAL | Status: AC
  Administered 2014-03-22: 1 via ORAL
  Filled 2014-03-22: qty 1

## 2014-03-22 NOTE — ED Notes (Signed)
The pt lives in an assisted living place.  She has arthritis and she reports that she gets stiff.  She got up to go to the br slipped and fell onto the rt side of her body.  She hurts all over her body.  Alert oriented skin warm and dry..  Her daughter is at the bedside

## 2014-03-22 NOTE — ED Notes (Signed)
Pt ambulated to bathroom with assistance and cane. Pt states she feels she is at her baseline. Pt attempted to provide urine sample- missed the toilet. Will try again in a little bit

## 2014-03-22 NOTE — Discharge Instructions (Signed)
Please call your doctor for a followup appointment within 24-48 hours. When you talk to your doctor please let them know that you were seen in the emergency department and have them acquire all of your records so that they can discuss the findings with you and formulate a treatment plan to fully care for your new and ongoing problems. ° °

## 2014-03-22 NOTE — ED Provider Notes (Signed)
CSN: [id number]     Arrival date & time 03/22/14  [id number] History   First MD Initiated Contact with Patient 03/22/14 [phone]     Chief Complaint  Patient presents with  . Fall     (Consider location/radiation/quality/duration/timing/severity/associated sxs/prior Treatment) HPI Comments: The pt is a 81 y/o female with hx of htn, Arthritis and Urinary Incontinence.  She also has hx of Polio.  She presents with c/o a fall that occurred this morning- she states that she slipped out of the bed when trying to get out of bed to use the bathroom.  She does not know why she fell - states that she has no pain outside of her normal arthritis pain.  She has no headache, no n/v, no CP, Abd pain or extremity pain outisde of normal pain.  She was able to get herself off the floor and get up without difficulty - called her daughter to come and get her who brought her to the hospital.  She has no blood in stools, no dysuria, no other c/o.  There was no bleeding or laceration reported by pt.  She is not on blood thinners.  Occurred just prior to her arrival, sx were acute in onset.  Patient is a 81 y.o. female presenting with fall. The history is provided by the patient.  Fall    Past Medical History  Diagnosis Date  . GERD (gastroesophageal reflux disease)     Papulous gastropathy EGD Oct. 2007  . Hypertension   . Low back pain     intermittent radiculopathy  . Osteoarthritis   . Pulmonary embolism     2/2 knee replacement  . Osteopenia     T score-1.1 R Fremur, - 0.4 L spine  . Hyperlipemia   . Polio 1942  . Urinary incontinence   . IBS (irritable bowel syndrome)    Past Surgical History  Procedure Laterality Date  . Joint replacement      total knee - left 1995, right 2004  . Abdominal hysterectomy  1985  . Oophorectomy  1985   Family History  Problem Relation Age of Onset  . Cancer Daughter     colon caner, <60  . Cancer Mother     lung   History  Substance Use Topics  . Smoking status:  Former Games developermoker  . Smokeless tobacco: Not on file  . Alcohol Use: No   OB History    No data available     Review of Systems  All other systems reviewed and are negative.     Allergies  Tramadol hcl  Home Medications   Prior to Admission medications   Medication Sig Start Date End Date Taking? Authorizing Provider  aspirin 81 MG chewable tablet Chew 81 mg by mouth daily.     Yes Historical Provider, MD  B Complex-Biotin-FA (B-50 COMPLEX PO) Take 1 tablet by mouth daily.   Yes Historical Provider, MD  diclofenac sodium (VOLTAREN) 1 % GEL Apply 2 g topically 4 (four) times daily. 03/11/14  Yes Genelle GatherKathryn F Glenn, MD  fish oil-omega-3 fatty acids 1000 MG capsule Take 2 g by mouth daily.   Yes Historical Provider, MD  hydrochlorothiazide (HYDRODIURIL) 25 MG tablet Take 1 tablet (25 mg total) by mouth daily. 09/17/13  Yes Ejiroghene Wendall StadeE Emokpae, MD  HYDROcodone-acetaminophen (NORCO/VICODIN) 5-325 MG per tablet Take one tablet by mouth every 8 hours as needed for knee pain 02/13/14  Yes Tasrif Ahmed, MD  lisinopril (PRINIVIL,ZESTRIL) 40 MG tablet TAKE 1 TABLET  BY MOUTH ONCE DAILY. 10/22/13  Yes Tasrif Ahmed, MD  NEXIUM 40 MG capsule TAKE 1 CAPSULE BY MOUTH DAILY BEFORE BREAKFAST. 01/15/14  Yes Tasrif Ahmed, MD  nitroGLYCERIN (NITROSTAT) 0.4 MG SL tablet Place 1 tablet (0.4 mg total) under the tongue every 5 (five) minutes as needed for chest pain. 01/22/14  Yes Stark Bray, MD  traZODone (DESYREL) 50 MG tablet Take 1 tablet (50 mg total) by mouth at bedtime as needed. Patient not taking: Reported on 03/11/2014 02/13/14   Hyacinth Meeker, MD  zoster vaccine live, PF, (ZOSTAVAX) 16109 UNT/0.65ML injection Inject 19,400 Units into the skin once. Patient not taking: Reported on 03/22/2014 07/25/13   Tasrif Ahmed, MD   BP 124/63 mmHg  Pulse 57  Temp(Src) 97.9 F (36.6 C) (Oral)  Resp 16  Ht  (1.651 m)  Wt 210 lb (95.255 kg)  BMI 34.95 kg/m2  SpO2 100% Physical Exam  Constitutional: She appears  well-developed and well-nourished. No distress.  HENT:  Head: Normocephalic and atraumatic.  Mouth/Throat: Oropharynx is clear and moist. No oropharyngeal exudate.  no facial tenderness, deformity, malocclusion or hemotympanum.  no battle's sign or racoon eyes.   Eyes: Conjunctivae and EOM are normal. Pupils are equal, round, and reactive to light. Right eye exhibits no discharge. Left eye exhibits no discharge. No scleral icterus.  Neck: Normal range of motion. Neck supple. No JVD present. No thyromegaly present.  Cardiovascular: Normal rate, regular rhythm, normal heart sounds and intact distal pulses.  Exam reveals no gallop and no friction rub.   No murmur heard. No carotid bruits  Pulmonary/Chest: Effort normal and breath sounds normal. No respiratory distress. She has no wheezes. She has no rales.  Abdominal: Soft. Bowel sounds are normal. She exhibits no distension and no mass. There is no tenderness.  Musculoskeletal: Normal range of motion. She exhibits no edema or tenderness.  Joints are supple, compartments are soft - no deformity  Lymphadenopathy:    She has no cervical adenopathy.  Neurological: She is alert. Coordination normal.  Neurologic exam:  Speech clear, pupils equal round reactive to light, extraocular movements intact  Normal peripheral visual fields Cranial nerves III through XII normal including no facial droop Follows commands, moves all extremities x4, normal strength to bilateral upper and lower extremities at all major muscle groups including grip Sensation normal to light touch and pinprick Coordination intact, no limb ataxia, finger-nose-finger normal Rapid alternating movements normal No pronator drift Gait normal   Skin: Skin is warm and dry. No rash noted. No erythema.  Psychiatric: She has a normal mood and affect. Her behavior is normal.  Nursing note and vitals reviewed.   ED Course  Procedures (including critical care time) Labs Review Labs  Reviewed  URINALYSIS, ROUTINE W REFLEX MICROSCOPIC - Abnormal; Notable for the following:    Leukocytes, UA TRACE (*)    All other components within normal limits  I-STAT CHEM 8, ED - Abnormal; Notable for the following:    Calcium, Ion 1.32 (*)    Hemoglobin 15.3 (*)    All other components within normal limits  URINE MICROSCOPIC-ADD ON    Imaging Review No results found.   EKG Interpretation   Date/Time:  Saturday March 22 2014 07:28:16 EST Ventricular Rate:  52 PR Interval:  155 QRS Duration: 82 QT Interval:  390 QTC Calculation: 363 R Axis:   46 Text Interpretation:  Sinus rhythm Atrial premature complex Low voltage,  precordial leads Baseline wander in lead(s) I III aVL  since last tracing  no significant change Confirmed by Kia Stavros  MD, Myrikal Messmer ([id number]) on 03/22/2014  7:35:10 AM      MDM   Final diagnoses:  Fall from bed, initial encounter  Minor head injury, initial encounter    The pt has no focal neuro defecits, normal gait - visualized in ED - her VS are as below.  Filed Vitals:   03/22/14 0637 03/22/14 0700  BP: 144/82 148/77  Pulse: 62 66  Temp: 97.9 F (36.6 C)   TempSrc: Oral   Resp: 18   Height:  (1.651 m)   Weight: 210 lb (95.255 kg)   SpO2: 100% 100%   Check some labs and ECG - no signs of traumatic injury.  Labs normal, ECG unremarkable, stable for d/c.  ambulated wtihout difficulty.  Eber Hong, MD 03/22/14 1003

## 2014-03-24 ENCOUNTER — Encounter: Payer: Self-pay | Admitting: Internal Medicine

## 2014-03-24 ENCOUNTER — Telehealth: Payer: Self-pay | Admitting: *Deleted

## 2014-03-24 ENCOUNTER — Encounter: Payer: Medicare Other | Admitting: Internal Medicine

## 2014-03-24 NOTE — Telephone Encounter (Signed)
Pt called and requests to be seen today for a fall from this weekend, have called her back x 2 will continue

## 2014-03-31 ENCOUNTER — Telehealth: Payer: Self-pay | Admitting: *Deleted

## 2014-03-31 NOTE — Telephone Encounter (Signed)
Received PA request from pt's pharmacy for her Voltaren Gel 1%.  Requested submitted online via Cover My Meds.  Request sent for review.Kingsley SpittleGoldston, Torry Adamczak [name]3/21/201611:01 AM          Cover my meds

## 2014-04-01 ENCOUNTER — Encounter: Payer: Medicare Other | Admitting: Internal Medicine

## 2014-04-01 ENCOUNTER — Encounter: Payer: Self-pay | Admitting: Internal Medicine

## 2014-04-01 ENCOUNTER — Telehealth: Payer: Self-pay | Admitting: *Deleted

## 2014-04-01 ENCOUNTER — Other Ambulatory Visit: Payer: Self-pay | Admitting: Sports Medicine

## 2014-04-01 ENCOUNTER — Ambulatory Visit (INDEPENDENT_AMBULATORY_CARE_PROVIDER_SITE_OTHER): Payer: Medicare Other | Admitting: Internal Medicine

## 2014-04-01 VITALS — BP 192/73 | HR 69 | Temp 97.7°F | Ht 64.0 in | Wt 199.7 lb

## 2014-04-01 DIAGNOSIS — I10 Essential (primary) hypertension: Secondary | ICD-10-CM

## 2014-04-01 DIAGNOSIS — M150 Primary generalized (osteo)arthritis: Secondary | ICD-10-CM | POA: Diagnosis not present

## 2014-04-01 DIAGNOSIS — M47818 Spondylosis without myelopathy or radiculopathy, sacral and sacrococcygeal region: Secondary | ICD-10-CM

## 2014-04-01 DIAGNOSIS — M159 Polyosteoarthritis, unspecified: Secondary | ICD-10-CM

## 2014-04-01 MED ORDER — HYDROCODONE-ACETAMINOPHEN 5-325 MG PO TABS: ORAL_TABLET | ORAL | Status: DC

## 2014-04-01 NOTE — Assessment & Plan Note (Signed)
Today, pt complaints of right hand pain, examination unremarkable. Pain mostly DIP and PIP, unlikley gout without swelling, redness or tenderness, also doubt RA, considering Age, distribution of tenderness and no objective findings on exam. Xray- negative for features suggesting either, but impression- Mild osteoarthritis.  Plan- Pt is on chronic opioid therapy. - Prescription given for one month supply of pain meds- norco 5-325mg  q8h, #90 pills, as per pain contract. Last refill per chart-  02/13/2014.

## 2014-04-01 NOTE — Telephone Encounter (Signed)
Resch appt in clinic for today 3:45PM Dr Virgina OrganQureshi - ER FU. Stanton KidneyDebra Demaya Hardge RN 04/01/14 10AM

## 2014-04-01 NOTE — Progress Notes (Signed)
Patient ID: Jennifer Owen, female   DOB: 04/29/1931, 81 y.o.   MRN: [id number]   Subjective:   Patient ID: Jennifer Owen female   DOB: 1931-01-22 81 y.o.   MRN: [id number]  HPI: Jennifer Owen is a 81 y.o. with PMH listed below. Presented today with c/o of right hand pain, started 3-4 months, sometimes gets worse. Pt says she fell, from her bed when she was going to the bathroom, 03/22/2014. She fell on her right shoulder, but she was having hand pain already before then. She presented to the ED, no imaging was recomended. She was discharged home. Today she has no problems with this shoulder. She denies having any swelling, she endorses she has some stiffness in the hand that last about 2 hours in the morning. She denies fever or redness. Also say she hurts in her legs too- her knees, and ankles, this is all chronic. And is relieved with her chronic pain meds, but she was also prescribed Voltaren gel, which helped but she cannot afford anymore.  Past Medical History  Diagnosis Date  . GERD (gastroesophageal reflux disease)     Papulous gastropathy EGD Oct. 2007  . Hypertension   . Low back pain     intermittent radiculopathy  . Osteoarthritis   . Pulmonary embolism     2/2 knee replacement  . Osteopenia     T score-1.1 R Fremur, - 0.4 L spine  . Hyperlipemia   . Polio 1942  . Urinary incontinence   . IBS (irritable bowel syndrome)    Current Outpatient Prescriptions  Medication Sig Dispense Refill  . aspirin 81 MG chewable tablet Chew 81 mg by mouth daily.      . B Complex-Biotin-FA (B-50 COMPLEX PO) Take 1 tablet by mouth daily.    . diclofenac sodium (VOLTAREN) 1 % GEL Apply 2 g topically 4 (four) times daily. 100 g 1  . fish oil-omega-3 fatty acids 1000 MG capsule Take 2 g by mouth daily.    . hydrochlorothiazide (HYDRODIURIL) 25 MG tablet Take 1 tablet (25 mg total) by mouth daily. 30 tablet 2  . HYDROcodone-acetaminophen (NORCO/VICODIN) 5-325 MG per tablet Take one  tablet by mouth every 8 hours as needed for knee pain 90 tablet 0  . lisinopril (PRINIVIL,ZESTRIL) 40 MG tablet TAKE 1 TABLET BY MOUTH ONCE DAILY. 30 tablet 11  . NEXIUM 40 MG capsule TAKE 1 CAPSULE BY MOUTH DAILY BEFORE BREAKFAST. 30 capsule 11  . nitroGLYCERIN (NITROSTAT) 0.4 MG SL tablet Place 1 tablet (0.4 mg total) under the tongue every 5 (five) minutes as needed for chest pain. 90 tablet 3  . traZODone (DESYREL) 50 MG tablet Take 1 tablet (50 mg total) by mouth at bedtime as needed. (Patient not taking: Reported on 03/11/2014) 30 tablet 5  . zoster vaccine live, PF, (ZOSTAVAX) 78295 UNT/0.65ML injection Inject 19,400 Units into the skin once. (Patient not taking: Reported on 03/22/2014) 1 each 0  . [DISCONTINUED] simvastatin (ZOCOR) 20 MG tablet Take 20 mg by mouth every evening.     No current facility-administered medications for this visit.   Family History  Problem Relation Age of Onset  . Cancer Daughter     colon caner, <60  . Cancer Mother     lung   History   Social History  . Marital Status: Legally Separated    Spouse Name: N/A  . Number of Children: 7  . Years of Education: N/A   Occupational History  . 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 Smoking status: Former Games developermoker  . Smokeless tobacco: Not on file  . Alcohol Use: No  . Drug Use: No  . Sexual Activity: Not on file   Other Topics Concern  . None   Social History Narrative   [facility]   Review of Systems: CONSTITUTIONAL- No Fever, weightloss, night sweat or change in appetite. SKIN- No Rash, colour changes or itching. HEAD- No Headache or dizziness. RESPIRATORY- No Cough or SOB. CARDIAC- No Palpitations, chest pain. GI- No nausea, abd pain. URINARY- No Frequency, urgency, straining or dysuria.  Objective:  Physical Exam: Filed Vitals:   04/01/14 1450  BP: 192/73  Pulse: 69  Temp: 97.7 F (36.5 C)  TempSrc: Oral  Height: 5\' 4"   (1.626 m)  Weight: 199 lb 11.2 oz (90.583 kg)  SpO2: 100%   GENERAL- alert, co-operative, appears as stated age, not in any distress. HEENT- Atraumatic, normocephalic, PERRL, EOMI, oral mucosa appears moist. CARDIAC- RRR, no murmurs, rubs or gallops. RESP- Moving equal volumes of air, and clear to auscultation bilaterally, no wheezes or crackles. ABDOMEN- Soft, nontender, bowel sounds present. NEURO- No obvious Cr N abnormality, Gait- Normal. EXTREMITIES- Right hand- No swelling of joints, she points to pain involving her DIP and PIPs only. No rash. No tenderness on palpation, no erythema. Normal range of motion, no boggy feeling, or effusion on joints. Left hand similar appearance, no complainst of abnormalities observed. Symmetric chronic pedal edema- bilat, no redness or tenderness.  SKIN- Warm, dry, No rash or lesion. PSYCH- Normal mood and affect, appropriate thought content and speech.  Assessment & Plan:   The patient's case and plan of care was discussed with attending physician, Dr. Rogelia BogaButcher.  Please see problem based charting for assessment and plan.

## 2014-04-01 NOTE — Patient Instructions (Signed)
Please use the pain medication we are giving you for your pain.  Your blood pressure is really high, please check your blood pressure everyday, write it down and bring the record with your blood pressure meter to your next appointment in 3-4 weeks.

## 2014-04-01 NOTE — Assessment & Plan Note (Signed)
BP Readings from Last 3 Encounters:  04/01/14 192/73  03/22/14 152/97  03/13/14 158/88    Lab Results  Component Value Date   NA 141 03/22/2014   K 4.0 03/22/2014   CREATININE 1.00 03/22/2014    Assessment: Blood pressure control:  Uncontrolled today Progress toward BP goal:   Not at goal Comments: Complaint with meds- Lisinopril- 40mg  daily, and HCZ- 25mg  daily.  Plan: Medications:  continue current medications Educational resources provided: handout Self management tools provided:   Other plans: patient says that she last checked her BP at home- on Sunday it was 116/70. She might just have white coat hypertension. Will not adjust meds for now. Counselled her to check her BP everyday, and write it down, bring the device and record with her for next visit, to cross check readings with our device.  - patient agreeable, see in 3 weeks.

## 2014-04-02 NOTE — Progress Notes (Signed)
Internal Medicine Clinic Attending  Case discussed with Dr. Emokpae soon after the resident saw the patient.  We reviewed the resident's history and exam and pertinent patient test results.  I agree with the assessment, diagnosis, and plan of care documented in the resident's note. 

## 2014-04-03 ENCOUNTER — Ambulatory Visit
Admission: RE | Admit: 2014-04-03 | Discharge: 2014-04-03 | Disposition: A | Discharge status: Home / Self Care | Payer: Medicare Other | Source: Ambulatory Visit | Attending: Sports Medicine | Admitting: Sports Medicine

## 2014-04-03 DIAGNOSIS — M47818 Spondylosis without myelopathy or radiculopathy, sacral and sacrococcygeal region: Secondary | ICD-10-CM

## 2014-04-03 MED ORDER — IOHEXOL 180 MG/ML  SOLN
1.0000 mL | Freq: Once | INTRAMUSCULAR | Status: AC | PRN
  Administered 2014-04-03: 1 mL via INTRA_ARTICULAR

## 2014-04-03 MED ORDER — METHYLPREDNISOLONE ACETATE 40 MG/ML INJ SUSP (RADIOLOG
120.0000 mg | Freq: Once | INTRAMUSCULAR | Status: AC
  Administered 2014-04-03: 120 mg via INTRA_ARTICULAR

## 2014-04-10 ENCOUNTER — Other Ambulatory Visit: Payer: Self-pay | Admitting: Internal Medicine

## 2014-04-10 DIAGNOSIS — I10 Essential (primary) hypertension: Secondary | ICD-10-CM

## 2014-04-17 ENCOUNTER — Ambulatory Visit (INDEPENDENT_AMBULATORY_CARE_PROVIDER_SITE_OTHER): Payer: Medicare Other | Admitting: Podiatrist

## 2014-04-17 DIAGNOSIS — B351 Tinea unguium: Secondary | ICD-10-CM

## 2014-04-17 DIAGNOSIS — M79676 Pain in unspecified toe(s): Secondary | ICD-10-CM

## 2014-04-19 ENCOUNTER — Emergency Department (HOSPITAL_COMMUNITY)
Admission: EM | Admit: 2014-04-19 | Discharge: 2014-04-19 | Disposition: A | Discharge status: Home / Self Care | Payer: Medicare Other | Attending: Emergency Medicine | Admitting: Emergency Medicine

## 2014-04-19 ENCOUNTER — Encounter (HOSPITAL_COMMUNITY): Payer: Self-pay | Admitting: Neurology

## 2014-04-19 DIAGNOSIS — M542 Cervicalgia: Secondary | ICD-10-CM | POA: Insufficient documentation

## 2014-04-19 DIAGNOSIS — Z79899 Other long term (current) drug therapy: Secondary | ICD-10-CM | POA: Insufficient documentation

## 2014-04-19 DIAGNOSIS — K219 Gastro-esophageal reflux disease without esophagitis: Secondary | ICD-10-CM | POA: Diagnosis not present

## 2014-04-19 DIAGNOSIS — G44209 Tension-type headache, unspecified, not intractable: Secondary | ICD-10-CM

## 2014-04-19 DIAGNOSIS — R51 Headache: Secondary | ICD-10-CM | POA: Diagnosis present

## 2014-04-19 DIAGNOSIS — M1990 Unspecified osteoarthritis, unspecified site: Secondary | ICD-10-CM | POA: Insufficient documentation

## 2014-04-19 DIAGNOSIS — Z87891 Personal history of nicotine dependence: Secondary | ICD-10-CM | POA: Diagnosis not present

## 2014-04-19 DIAGNOSIS — Z8639 Personal history of other endocrine, nutritional and metabolic disease: Secondary | ICD-10-CM | POA: Diagnosis not present

## 2014-04-19 DIAGNOSIS — Z86711 Personal history of pulmonary embolism: Secondary | ICD-10-CM | POA: Insufficient documentation

## 2014-04-19 DIAGNOSIS — Z8619 Personal history of other infectious and parasitic diseases: Secondary | ICD-10-CM | POA: Insufficient documentation

## 2014-04-19 DIAGNOSIS — I10 Essential (primary) hypertension: Secondary | ICD-10-CM | POA: Diagnosis not present

## 2014-04-19 DIAGNOSIS — Z7982 Long term (current) use of aspirin: Secondary | ICD-10-CM | POA: Insufficient documentation

## 2014-04-19 MED ORDER — METHOCARBAMOL 500 MG PO TABS
1000.0000 mg | ORAL_TABLET | Freq: Once | ORAL | Status: AC
  Administered 2014-04-19: 1000 mg via ORAL
  Filled 2014-04-19: qty 2

## 2014-04-19 MED ORDER — METHOCARBAMOL 500 MG PO TABS: 500.0000 mg | ORAL_TABLET | Freq: Three times a day (TID) | ORAL | Status: DC | PRN

## 2014-04-19 MED ORDER — HYDROCODONE-ACETAMINOPHEN 5-325 MG PO TABS
1.0000 | ORAL_TABLET | Freq: Once | ORAL | Status: AC
  Administered 2014-04-19: 1 via ORAL
  Filled 2014-04-19: qty 1

## 2014-04-19 NOTE — ED Notes (Signed)
Pt monitored by pulse ox, bp cuff, and 12-lead. 

## 2014-04-19 NOTE — ED Notes (Signed)
MD James at bedside.  

## 2014-04-19 NOTE — ED Notes (Signed)
Pt's daughter Drinda Buttsnnette, coming to pick her up. Pt provided coffee, meal tray ordered for pt while she waits.

## 2014-04-19 NOTE — ED Provider Notes (Signed)
CSN: [id number]     Arrival date & time 04/19/14  4098 History   First MD Initiated Contact with Patient 04/19/14 [phone]     Chief Complaint  Patient presents with  . Headache      HPI  Patient presents for admission of a right sided headache. She felt well yesterday. Went to sleep. Awakened at 2 AM and noticed a throbbing headache behind her right ear. She took some Tylenol, and back to bed. She awakened this morning and still had a headache she presents here.  Described as right occipital into the right superior lateral neck. No neck stiffness. No fevers. No vision changes. No associated symptoms with nausea vomiting. Had a fall 2 weeks ago but did not strike her head. Has been doing well until headache last night.  Past Medical History  Diagnosis Date  . GERD (gastroesophageal reflux disease)     Papulous gastropathy EGD Oct. 2007  . Hypertension   . Low back pain     intermittent radiculopathy  . Osteoarthritis   . Pulmonary embolism     2/2 knee replacement  . Osteopenia     T score-1.1 R Fremur, - 0.4 L spine  . Hyperlipemia   . Polio 1942  . Urinary incontinence   . IBS (irritable bowel syndrome)    Past Surgical History  Procedure Laterality Date  . Joint replacement      total knee - left 1995, right 2004  . Abdominal hysterectomy  1985  . Oophorectomy  1985   Family History  Problem Relation Age of Onset  . Cancer Daughter     colon caner, <60  . Cancer Mother     lung   History  Substance Use Topics  . Smoking status: Former Games developer  . Smokeless tobacco: Not on file  . Alcohol Use: No   OB History    No data available     Review of Systems  Constitutional: Negative for fever, chills, diaphoresis, appetite change and fatigue.  HENT: Negative for mouth sores, sore throat and trouble swallowing.   Eyes: Negative for visual disturbance.  Respiratory: Negative for cough, chest tightness, shortness of breath and wheezing.   Cardiovascular: Negative for  chest pain.  Gastrointestinal: Negative for nausea, vomiting, abdominal pain, diarrhea and abdominal distention.  Endocrine: Negative for polydipsia, polyphagia and polyuria.  Genitourinary: Negative for dysuria, frequency and hematuria.  Musculoskeletal: Positive for neck pain. Negative for gait problem.  Skin: Negative for color change, pallor and rash.  Neurological: Positive for headaches. Negative for dizziness, syncope and light-headedness.  Hematological: Does not bruise/bleed easily.  Psychiatric/Behavioral: Negative for behavioral problems and confusion.      Allergies  Tramadol hcl  Home Medications   Prior to Admission medications   Medication Sig Start Date End Date Taking? Authorizing Provider  aspirin 81 MG chewable tablet Chew 81 mg by mouth daily.      Historical Provider, MD  B Complex-Biotin-FA (B-50 COMPLEX PO) Take 1 tablet by mouth daily.    Historical Provider, MD  diclofenac sodium (VOLTAREN) 1 % GEL Apply 2 g topically 4 (four) times daily. 03/11/14   Genelle Gather, MD  fish oil-omega-3 fatty acids 1000 MG capsule Take 2 g by mouth daily.    Historical Provider, MD  hydrochlorothiazide (HYDRODIURIL) 25 MG tablet Take 1 tablet (25 mg total) by mouth daily. 04/10/14   Doneen Poisson, MD  HYDROcodone-acetaminophen (NORCO/VICODIN) 5-325 MG per tablet Take one tablet by mouth every 8 hours as  needed for knee pain 04/01/14   Ejiroghene E Emokpae, MD  lisinopril (PRINIVIL,ZESTRIL) 40 MG tablet TAKE 1 TABLET BY MOUTH ONCE DAILY. 10/22/13   Tasrif Ahmed, MD  methocarbamol (ROBAXIN) 500 MG tablet Take 1 tablet (500 mg total) by mouth 3 (three) times daily between meals as needed. 04/19/14   Rolland PorterMark Kaia Depaolis, MD  NEXIUM 40 MG capsule TAKE 1 CAPSULE BY MOUTH DAILY BEFORE BREAKFAST. 01/15/14   Hyacinth Meekerasrif Ahmed, MD  nitroGLYCERIN (NITROSTAT) 0.4 MG SL tablet Place 1 tablet (0.4 mg total) under the tongue every 5 (five) minutes as needed for chest pain. 01/22/14   Stark BrayJulia E Mallory, MD  traZODone  (DESYREL) 50 MG tablet Take 1 tablet (50 mg total) by mouth at bedtime as needed. Patient not taking: Reported on 03/11/2014 02/13/14   Hyacinth Meekerasrif Ahmed, MD  zoster vaccine live, PF, (ZOSTAVAX) [id number] UNT/0.65ML injection Inject 19,400 Units into the skin once. Patient not taking: Reported on 03/22/2014 07/25/13   Tasrif Ahmed, MD   BP 115/59 mmHg  Pulse 57  Temp(Src) 97.7 F (36.5 C) (Oral)  Resp 17  SpO2 91% Physical Exam  Constitutional: She is oriented to person, place, and time. She appears well-developed and well-nourished. No distress.  HENT:  Head: Normocephalic.    Tenderness along the right neck musculature and into the skull base. Normal TM. No vesicles. No skin sensitivity. Normal range of motion without meningismus.  Eyes: Conjunctivae are normal. Pupils are equal, round, and reactive to light. No scleral icterus.  Neck: Normal range of motion. Neck supple. No thyromegaly present.  Cardiovascular: Normal rate and regular rhythm.  Exam reveals no gallop and no friction rub.   No murmur heard. Pulmonary/Chest: Effort normal and breath sounds normal. No respiratory distress. She has no wheezes. She has no rales.  Abdominal: Soft. Bowel sounds are normal. She exhibits no distension. There is no tenderness. There is no rebound.  Musculoskeletal: Normal range of motion.  Neurological: She is alert and oriented to person, place, and time.  Skin: Skin is warm and dry. No rash noted.  Psychiatric: She has a normal mood and affect. Her behavior is normal.    ED Course  Procedures (including critical care time) Labs Review Labs Reviewed - No data to display  Imaging Review No results found.   EKG Interpretation None      MDM   Final diagnoses:  Muscle tension headache    No indication for imaging. No neurological symptoms. Reproducible tenderness seems clearly muscular. Given Robaxin. Single dose Vicodin. Plan symptomatic treatment.    Rolland PorterMark Raziah Funnell, MD 04/19/14 [phone]

## 2014-04-19 NOTE — Discharge Instructions (Signed)

## 2014-04-19 NOTE — ED Notes (Addendum)
[facility]. Pt reports h/a since last night, took tylenol and went to bed. Woke up at 0200 this morning with continued h/a. Went back to back. Reports couldn't sleep because of h/a. Pain extends into neck. Denies n/v. BP 180/120 (didn't take meds). Pt is a x 4

## 2014-04-22 NOTE — Telephone Encounter (Signed)
Request was approved through 03/31/2015 .[name], Jennifer [name]4/12/201610:50 AM

## 2014-04-25 ENCOUNTER — Telehealth: Payer: Self-pay | Admitting: *Deleted

## 2014-04-25 ENCOUNTER — Encounter (HOSPITAL_COMMUNITY): Payer: Self-pay | Admitting: *Deleted

## 2014-04-25 ENCOUNTER — Emergency Department (HOSPITAL_COMMUNITY)
Admission: EM | Admit: 2014-04-25 | Discharge: 2014-04-25 | Disposition: A | Discharge status: Home / Self Care | Payer: Medicare Other | Attending: Emergency Medicine | Admitting: Emergency Medicine

## 2014-04-25 DIAGNOSIS — Z8719 Personal history of other diseases of the digestive system: Secondary | ICD-10-CM | POA: Insufficient documentation

## 2014-04-25 DIAGNOSIS — Z87891 Personal history of nicotine dependence: Secondary | ICD-10-CM | POA: Insufficient documentation

## 2014-04-25 DIAGNOSIS — I10 Essential (primary) hypertension: Secondary | ICD-10-CM | POA: Diagnosis not present

## 2014-04-25 DIAGNOSIS — Z7982 Long term (current) use of aspirin: Secondary | ICD-10-CM | POA: Insufficient documentation

## 2014-04-25 DIAGNOSIS — M1990 Unspecified osteoarthritis, unspecified site: Secondary | ICD-10-CM | POA: Insufficient documentation

## 2014-04-25 DIAGNOSIS — Z8619 Personal history of other infectious and parasitic diseases: Secondary | ICD-10-CM | POA: Diagnosis not present

## 2014-04-25 DIAGNOSIS — R443 Hallucinations, unspecified: Secondary | ICD-10-CM | POA: Insufficient documentation

## 2014-04-25 DIAGNOSIS — R4182 Altered mental status, unspecified: Secondary | ICD-10-CM | POA: Diagnosis present

## 2014-04-25 DIAGNOSIS — Z8639 Personal history of other endocrine, nutritional and metabolic disease: Secondary | ICD-10-CM | POA: Insufficient documentation

## 2014-04-25 DIAGNOSIS — Z86711 Personal history of pulmonary embolism: Secondary | ICD-10-CM | POA: Diagnosis not present

## 2014-04-25 LAB — COMPREHENSIVE METABOLIC PANEL
ALBUMIN: 3.5 g/dL (ref 3.5–5.2)
ALK PHOS: 70 U/L (ref 39–117)
ALT: 19 U/L (ref 0–35)
ANION GAP: 11 (ref 5–15)
AST: 27 U/L (ref 0–37)
BUN: 16 mg/dL (ref 6–23)
CALCIUM: 10.2 mg/dL (ref 8.4–10.5)
CO2: 25 mmol/L (ref 19–32)
Chloride: 104 mmol/L (ref 96–112)
Creatinine, Ser: 0.88 mg/dL (ref 0.50–1.10)
GFR calc Af Amer: 70 mL/min — ABNORMAL LOW (ref >90)
GFR calc non Af Amer: 60 mL/min — ABNORMAL LOW (ref >90)
GLUCOSE: 80 mg/dL (ref 70–99)
Potassium: 4.1 mmol/L (ref 3.5–5.1)
SODIUM: 140 mmol/L (ref 135–145)
TOTAL PROTEIN: 6.4 g/dL (ref 6.0–8.3)
Total Bilirubin: 0.8 mg/dL (ref 0.3–1.2)

## 2014-04-25 LAB — URINALYSIS, ROUTINE W REFLEX MICROSCOPIC
Bilirubin Urine: NEGATIVE
Glucose, UA: NEGATIVE mg/dL
HGB URINE DIPSTICK: NEGATIVE
Ketones, ur: 15 mg/dL — AB
NITRITE: NEGATIVE
Protein, ur: NEGATIVE mg/dL
SPECIFIC GRAVITY, URINE: 1.018 (ref 1.005–1.030)
Urobilinogen, UA: 1 mg/dL (ref 0.0–1.0)
pH: 5.5 (ref 5.0–8.0)

## 2014-04-25 LAB — CBC WITH DIFFERENTIAL/PLATELET
BASOS ABS: 0 10*3/uL (ref 0.0–0.1)
BASOS PCT: 1 % (ref 0–1)
EOS ABS: 0.1 10*3/uL (ref 0.0–0.7)
Eosinophils Relative: 3 % (ref 0–5)
HCT: 38 % (ref 36.0–46.0)
Hemoglobin: 12.8 g/dL (ref 12.0–15.0)
Lymphocytes Relative: 35 % (ref 12–46)
Lymphs Abs: 1.7 10*3/uL (ref 0.7–4.0)
MCH: 33.1 pg (ref 26.0–34.0)
MCHC: 33.7 g/dL (ref 30.0–36.0)
MCV: 98.2 fL (ref 78.0–100.0)
Monocytes Absolute: 0.5 10*3/uL (ref 0.1–1.0)
Monocytes Relative: 10 % (ref 3–12)
NEUTROS ABS: 2.5 10*3/uL (ref 1.7–7.7)
Neutrophils Relative %: 51 % (ref 43–77)
Platelets: 231 10*3/uL (ref 150–400)
RBC: 3.87 MIL/uL (ref 3.87–5.11)
RDW: 12.6 % (ref 11.5–15.5)
WBC: 4.9 10*3/uL (ref 4.0–10.5)

## 2014-04-25 LAB — URINE MICROSCOPIC-ADD ON

## 2014-04-25 LAB — TROPONIN I

## 2014-04-25 NOTE — ED Notes (Signed)
Family reports patient has had some mental changes noted over the past month.  Patient has had confusion, hallucinations, and weakness.  She also has left leg swelling.  Patient was seen her Saturday for headaches.  Patient was seen by MD today who sent patient here due to concerns of overmedicating self.  Family reports there are multiple bottles of same meds from different doctors.  She has multiple bottles of hctz, nitro, and ?methocarbimol and vicodin.  Patient is unsure if she has taken too much medication.  Patient is alert.  Airway is patent

## 2014-04-25 NOTE — ED Notes (Signed)
Multiple attempts to start IV with blood draw, unsuccessful. EDP aware, Lab will come and attempt for blood draw.

## 2014-04-25 NOTE — Telephone Encounter (Signed)
Daughter brought pt to clinic - past 2 weeks have noted pt having hallucination. Stayed with a daughter last night.  Same  meds have multi containers and different meds mixed in another pill bottle. At this time - no open appt in clinic today. Suggest to go to ER. Pt has 2 daughter with her. Stanton KidneyDebra Henleigh Robello RN 04/25/14 10:30AM

## 2014-04-25 NOTE — ED Provider Notes (Signed)
CSN: [id number]     Arrival date & time 04/25/14  8295 History   First MD Initiated Contact with Patient 04/25/14 0957     Chief Complaint  Patient presents with  . Altered Mental Status  . Weakness     (Consider location/radiation/quality/duration/timing/severity/associated sxs/prior Treatment) Patient is a 81 y.o. female presenting with altered mental status.  Altered Mental Status Presenting symptoms: confusion   Severity:  Moderate Episode history:  Continuous Duration:  1 month Timing:  Constant Progression:  Unchanged Chronicity:  New Context comment:  Family is concerned she is taking her medications incorrectly or taking too much medication.  Associated symptoms: fever (subjective fever about 5 days ago, she stayed home from church.  none since. ), hallucinations (Family reports pt has seen people walking in her house that aren't really there.  ) and headaches (not so bad that she had to leave her community center yesterday.)   Associated symptoms: no abdominal pain, no nausea and no vomiting     Past Medical History  Diagnosis Date  . GERD (gastroesophageal reflux disease)     Papulous gastropathy EGD Oct. 2007  . Hypertension   . Low back pain     intermittent radiculopathy  . Osteoarthritis   . Pulmonary embolism     2/2 knee replacement  . Osteopenia     T score-1.1 R Fremur, - 0.4 L spine  . Hyperlipemia   . Polio 1942  . Urinary incontinence   . IBS (irritable bowel syndrome)    Past Surgical History  Procedure Laterality Date  . Joint replacement      total knee - left 1995, right 2004  . Abdominal hysterectomy  1985  . Oophorectomy  1985   Family History  Problem Relation Age of Onset  . Cancer Daughter     colon caner, <60  . Cancer Mother     lung   History  Substance Use Topics  . Smoking status: Former Games developer  . Smokeless tobacco: Not on file  . Alcohol Use: No   OB History    No data available     Review of Systems   Constitutional: Positive for fever (subjective fever about 5 days ago, she stayed home from church.  none since. ).  HENT: Negative for congestion.   Respiratory: Negative for cough and shortness of breath.   Cardiovascular: Negative for chest pain.  Gastrointestinal: Negative for nausea, vomiting, abdominal pain and diarrhea.  Neurological: Positive for headaches (not so bad that she had to leave her community center yesterday.).  Psychiatric/Behavioral: Positive for hallucinations (Family reports pt has seen people walking in her house that aren't really there.  ) and confusion.  All other systems reviewed and are negative.     Allergies  Tramadol hcl  Home Medications   Prior to Admission medications   Medication Sig Start Date End Date Taking? Authorizing Provider  aspirin 81 MG chewable tablet Chew 81 mg by mouth daily.     Yes Historical Provider, MD  B Complex-Biotin-FA (B-50 COMPLEX PO) Take 1 tablet by mouth daily.   Yes Historical Provider, MD  Biotin 1 MG CAPS Take 1 mg by mouth daily.   Yes Historical Provider, MD  diclofenac sodium (VOLTAREN) 1 % GEL Apply 2 g topically 4 (four) times daily. 03/11/14  Yes Genelle Gather, MD  fish oil-omega-3 fatty acids 1000 MG capsule Take 2 g by mouth daily.   Yes Historical Provider, MD  hydrochlorothiazide (HYDRODIURIL) 25 MG tablet Take  1 tablet (25 mg total) by mouth daily. 04/10/14  Yes Doneen Poisson, MD  HYDROcodone-acetaminophen (NORCO/VICODIN) 5-325 MG per tablet Take one tablet by mouth every 8 hours as needed for knee pain 04/01/14  Yes Ejiroghene E Emokpae, MD  lisinopril (PRINIVIL,ZESTRIL) 40 MG tablet TAKE 1 TABLET BY MOUTH ONCE DAILY. 10/22/13  Yes Tasrif Ahmed, MD  methocarbamol (ROBAXIN) 500 MG tablet Take 1 tablet (500 mg total) by mouth 3 (three) times daily between meals as needed. 04/19/14  Yes Rolland Porter, MD  NEXIUM 40 MG capsule TAKE 1 CAPSULE BY MOUTH DAILY BEFORE BREAKFAST. 01/15/14  Yes Tasrif Ahmed, MD  traZODone  (DESYREL) 50 MG tablet Take 1 tablet (50 mg total) by mouth at bedtime as needed. 02/13/14  Yes Tasrif Ahmed, MD  nitroGLYCERIN (NITROSTAT) 0.4 MG SL tablet Place 1 tablet (0.4 mg total) under the tongue every 5 (five) minutes as needed for chest pain. 01/22/14   Stark Bray, MD  zoster vaccine live, PF, (ZOSTAVAX) [id number] UNT/0.65ML injection Inject 19,400 Units into the skin once. Patient not taking: Reported on 03/22/2014 07/25/13   Tasrif Ahmed, MD   BP 154/85 mmHg  Pulse 65  Temp(Src) 98.5 F (36.9 C) (Oral)  Resp 28  SpO2 100% Physical Exam  Constitutional: She is oriented to person, place, and time. She appears well-developed and well-nourished. No distress.  HENT:  Head: Normocephalic and atraumatic.  Mouth/Throat: Oropharynx is clear and moist.  Eyes: Conjunctivae are normal. Pupils are equal, round, and reactive to light. No scleral icterus.  Neck: Neck supple.  Cardiovascular: Normal rate, regular rhythm, normal heart sounds and intact distal pulses.   No murmur heard. Pulmonary/Chest: Effort normal and breath sounds normal. No stridor. No respiratory distress. She has no rales.  Abdominal: Soft. Bowel sounds are normal. She exhibits no distension. There is tenderness (mild, epigastric).  Musculoskeletal: Normal range of motion.  Neurological: She is alert and oriented to person, place, and time.  Skin: Skin is warm and dry. No rash noted.  Psychiatric: She has a normal mood and affect. Her behavior is normal.  Nursing note and vitals reviewed.   ED Course  Procedures (including critical care time) Labs Review Labs Reviewed  COMPREHENSIVE METABOLIC PANEL - Abnormal; Notable for the following:    GFR calc non Af Amer 60 (*)    GFR calc Af Amer 70 (*)    All other components within normal limits  URINALYSIS, ROUTINE W REFLEX MICROSCOPIC - Abnormal; Notable for the following:    Ketones, ur 15 (*)    Leukocytes, UA SMALL (*)    All other components within normal limits   CBC WITH DIFFERENTIAL/PLATELET  TROPONIN I  URINE MICROSCOPIC-ADD ON    Imaging Review No results found.   EKG Interpretation   Date/Time:  Friday April 25 2014 10:55:20 EDT Ventricular Rate:  53 PR Interval:  129 QRS Duration: 92 QT Interval:  418 QTC Calculation: 392 R Axis:   28 Text Interpretation:  Sinus rhythm Abnormal R-wave progression, early  transition No significant change was found Confirmed by [facility]  MD, TREY  ([id number]) on 04/25/2014 1:47:58 PM      MDM   Final diagnoses:  Hallucinations    81 yo female with increased confusion over past month with intermittent hallucinations.  She is alert and oriented in ED.  Appears to be fairly well functioning.  Family self discontinued her medications last night, because they were concerned they might be causing symptoms.    During ED course,  pt remained well appearing, but did endorse one episode of hallucinating that a man was laying on the floor in the room.  On further history, she agrees that these hallucinations started after she was started on robaxin a few days ago.  This could be the source of symptoms.  Advised she discontinue this and vicodin until she can see her PCP (she has appointment in a few days).  Meanwhile, she appears to be able to maintain good daily function despite these hallucinations.  She also has family who agree to stay with her in the meantime.  Labs and EKG reassuring.  Family comfortable with outpatient plan, but will bring her back to the ED if new symptoms develop.    Blake DivineJohn Lulla Linville, MD 04/25/14 1728

## 2014-04-28 ENCOUNTER — Ambulatory Visit (INDEPENDENT_AMBULATORY_CARE_PROVIDER_SITE_OTHER): Payer: Medicare Other | Admitting: Internal Medicine

## 2014-04-28 ENCOUNTER — Encounter: Payer: Self-pay | Admitting: Internal Medicine

## 2014-04-28 VITALS — BP 133/66 | HR 76 | Temp 97.9°F | Ht 66.0 in | Wt 191.6 lb

## 2014-04-28 DIAGNOSIS — M150 Primary generalized (osteo)arthritis: Secondary | ICD-10-CM

## 2014-04-28 DIAGNOSIS — I10 Essential (primary) hypertension: Secondary | ICD-10-CM

## 2014-04-28 DIAGNOSIS — R443 Hallucinations, unspecified: Secondary | ICD-10-CM | POA: Diagnosis not present

## 2014-04-28 DIAGNOSIS — R296 Repeated falls: Secondary | ICD-10-CM

## 2014-04-28 DIAGNOSIS — M159 Polyosteoarthritis, unspecified: Secondary | ICD-10-CM

## 2014-04-28 DIAGNOSIS — K219 Gastro-esophageal reflux disease without esophagitis: Secondary | ICD-10-CM | POA: Diagnosis not present

## 2014-04-28 DIAGNOSIS — R441 Visual hallucinations: Secondary | ICD-10-CM | POA: Insufficient documentation

## 2014-04-28 MED ORDER — NEXIUM 40 MG PO CPDR: DELAYED_RELEASE_CAPSULE | ORAL | Status: DC

## 2014-04-28 MED ORDER — LISINOPRIL 40 MG PO TABS: 40.0000 mg | ORAL_TABLET | Freq: Every day | ORAL | Status: DC

## 2014-04-28 MED ORDER — HYDROCHLOROTHIAZIDE 25 MG PO TABS: 25.0000 mg | ORAL_TABLET | Freq: Every day | ORAL | Status: DC

## 2014-04-28 NOTE — Assessment & Plan Note (Addendum)
Did not have hallucination in the past but started hallucinating after going to the ED on 04/19/14 and getting robaxin for her headache from the recent fall on 04/18/14. She went back to the ED on 04/25/14. Was told to not take her norco and robaxin. She seems to be improving now. Al though she still has some confusion. Seems to be better than before. Her neuro exam is benign, denies auditory-visual hallucination, SI/HI. A&ox3.   I think this is likely from the robaxin. Norco was a chronic med and she was not confused on this. Explained that in elderly it's easy to get delirium/hallucination from medications and we want to avoid any sedatives as much as possible including her Norco and robaxin. I told her to try tylenol for osteoarthritis which she has been doing for last few days as told by the ED. Asked her to come back if her pain gets out of control with the tylenol.

## 2014-04-28 NOTE — Patient Instructions (Signed)
You are doing great with your blood pressure. Keep taking your current meds.  You should avoid taking oxycodone and also robaxin. Robaxin is the likely medicine that caused your hallucination.   It seems that you are getting better. If you are not getting significantly better with your hallucination in 2 weeks please come back. Otherwise just follow up in 3 months.   Take tylenol for your osteoarthritis pain. If pain is not well controlled, let us know.

## 2014-04-28 NOTE — Assessment & Plan Note (Signed)
Has ostoearrhtis of multiple joints. Has been on norco 5-325mg  q8hr in the past. Xray showed mild OA in the past.  norco was d/ced by ED for recent hallucination (which is likely from her robaxin that was new for her).  I told her to try tylenol for osteoarthritis which she has been doing for last few days as told by the ED. Asked her to come back if her pain gets out of control with the tylenol. Would refill norco at that time. She is not taking norco currently.

## 2014-04-28 NOTE — Assessment & Plan Note (Signed)
Filed Vitals:   04/28/14 1323  BP: 133/66  Pulse: 76  Temp: 97.9 F (36.6 C)    bp well controlled on hctz 25mg  daily and lisinopril 40mg  daily. Had recent labs. Continue these. Refilled both.

## 2014-04-28 NOTE — Assessment & Plan Note (Signed)
Patient has frequent falls. Has bruising on left knee and also TTP on right temple from recent fall on 04/18/14. Describes as just slipping from the bed. Talked about Neuro rehab. Agreed to it. Referred to Neuro Rehab.  Also asked her to continue her water aerobics.

## 2014-04-28 NOTE — Assessment & Plan Note (Signed)
Gets occasional GERD symptoms, takes nexium, doing well. Refilled nexium.

## 2014-04-28 NOTE — Progress Notes (Signed)
   Subjective:    Patient ID: Jennifer Owen, female    DOB: 08-02-1931, 81 y.o.   MRN: [id number]  HPI  81 yo female with chronic pain from multi joint OA, HTN, HLD, comes for follow up.  She was recently seen in ED for a fall on 04/18/14. Was started on robaxin and she started having hallucination. Went back to ED on 04/25/14 and was taken off robaxin and also her chronic norco. She still is having occasional confusion but has been improving. Family is concerned about it.  Patient stated falling on 4/8 just by slipping. Denies any previous hx of falls but EMS shows she did also fall on march. She is doing water aerobics exercise. Her OA pain is well controlled on tylenol for now. Not on any norco now.    Review of Systems  Constitutional: Negative for fever, chills and fatigue.  HENT: Negative for hearing loss, sinus pressure and sore throat.   Eyes: Negative for pain, discharge and visual disturbance.  Respiratory: Negative for cough, shortness of breath and wheezing.   Cardiovascular: Negative for chest pain and palpitations.  Gastrointestinal: Negative for nausea, vomiting and diarrhea.  Endocrine: Negative.   Genitourinary: Negative for dysuria.  Allergic/Immunologic: Negative.   Neurological: Positive for headaches. Negative for dizziness, seizures, syncope, weakness, light-headedness and numbness.  Hematological: Negative.   Psychiatric/Behavioral: Positive for hallucinations and confusion. Negative for suicidal ideas and self-injury.       Objective:   Physical Exam  Constitutional: She is oriented to person, place, and time. She appears well-developed and well-nourished. No distress.  HENT:  Head: Normocephalic and atraumatic.  Right Ear: External ear normal.  Left Ear: External ear normal.  Mouth/Throat: Oropharynx is clear and moist. No oropharyngeal exudate.  Recently had fall, has mild TTP on R temporal scalp area.   Eyes: Conjunctivae and EOM are normal. Pupils are  equal, round, and reactive to light. Right eye exhibits no discharge. Left eye exhibits no discharge.  Neck: Normal range of motion.  Cardiovascular: Normal rate and regular rhythm.  Exam reveals no gallop and no friction rub.   No murmur heard. Pulmonary/Chest: Effort normal and breath sounds normal. No respiratory distress. She has no wheezes. She has no rales. She exhibits no tenderness.  Abdominal: Soft. She exhibits no distension. There is no tenderness. There is no guarding.  Musculoskeletal: Normal range of motion. She exhibits no edema or tenderness.  Small bruise on left knee from recent fall. Right leg normal. Otherwise no abnormalities. No pedal edema.  Neurological: She is alert and oriented to person, place, and time. Coordination normal.  Skin: She is not diaphoretic.      Filed Vitals:   04/28/14 1323  BP: 133/66  Pulse: 76  Temp: 97.9 F (36.6 C)       Assessment & Plan:  See problem based a&p.

## 2014-04-29 NOTE — Progress Notes (Signed)
Internal Medicine Clinic Attending  Case discussed with Dr. Ahmed at the time of the visit.  We reviewed the resident's history and exam and pertinent patient test results.  I agree with the assessment, diagnosis, and plan of care documented in the resident's note. 

## 2014-05-06 ENCOUNTER — Encounter: Payer: Self-pay | Admitting: Internal Medicine

## 2014-05-06 ENCOUNTER — Telehealth: Payer: Self-pay | Admitting: *Deleted

## 2014-05-06 ENCOUNTER — Ambulatory Visit (INDEPENDENT_AMBULATORY_CARE_PROVIDER_SITE_OTHER): Payer: Medicare Other | Admitting: Internal Medicine

## 2014-05-06 VITALS — BP 149/76 | HR 68 | Temp 98.0°F | Ht 66.0 in | Wt 192.7 lb

## 2014-05-06 DIAGNOSIS — R441 Visual hallucinations: Secondary | ICD-10-CM | POA: Diagnosis not present

## 2014-05-06 NOTE — Telephone Encounter (Signed)
Daughter called - pt is getting worse about seeing people, behavior  has changed and keeps wanting to leave home. Adjusted meds on last visit to clinic - behavior worse. Daughter is concerned. Appt 05/06/14 2:15PM Dr Zada GirtKazibwe. To bring meds to visit. Jennifer KidneyDebra Jaymin Waln RN 05/06/14 11:20AM

## 2014-05-06 NOTE — Patient Instructions (Addendum)
General Instructions: Please take only Lisinopril, Hydrochlorthiazide and aspirin  I will discontinue all other medications  Please come back in one month to see your regular doctor  Please bring your medicines with you each time you come to clinic.  Medicines may include prescription medications, over-the-counter medications, herbal remedies, eye drops, vitamins, or other pills.   Progress Toward Treatment Goals:  Treatment Goal 03/11/2014  Blood pressure deteriorated    Self Care Goals & Plans:  Self Care Goal 05/06/2014  Manage my medications take my medicines as prescribed; bring my medications to every visit; refill my medications on time  Monitor my health -  Eat healthy foods eat more vegetables; eat foods that are low in salt; eat baked foods instead of fried foods  Be physically active -  Meeting treatment goals -    No flowsheet data found.   Care Management & Community Referrals:  Referral 11/20/2013  Referrals made for care management support none needed  Referrals made to community resources none

## 2014-05-07 NOTE — Progress Notes (Signed)
Case discussed with Dr. Zada GirtKazibwe at time of visit. We reviewed the resident's history and exam and pertinent patient test results. I agree with the assessment, diagnosis, and plan of care documented in the resident's note.  All other non-essential medications were also discontinued so as not to confuse the picture if Ophthalmology assessment is unremarkable.

## 2014-05-07 NOTE — Progress Notes (Signed)
Patient ID: Jennifer Owen, female   DOB: 04/23/1931, 81 y.o.   MRN: [id number]   Subjective:   HPI: Jennifer Owen is a 81 y.o. woman presents for follow-up.  Reason(s) for visit:  Hallucinations: Patient is accompanied by her daughter for follow-up on her previous visit for evaluation of hallucinations. Patient reports that she continues to experience imaginary disturbances of people following her several times in a day. She denies auditory hallucinations. She reports that her vision is at baseline. She wears glasses. Denies diplopia or floaters. She denies delusions. She has no confusion, and her memory has not acutely worsened over the past few weeks. During her last office visit on 04/28/2014, it was felt that her hallucinations were caused by medications, since she had just been exposed Opiates and Robaxin. She had not taken any of these medications in the last 2 weeks, but the symptoms persist. No family history of psychiatry disorders. She denies depression or anxiety. No constitutional symptoms. She has no urinary symptoms. Due to increased disturbance with visual hallucinations, the daughter has had to move in with her in the meantime. She denies she showed dysfunction as she has continued with her daily routines despite the hallucinations.   Past Medical History  Diagnosis Date  . GERD (gastroesophageal reflux disease)     Papulous gastropathy EGD Oct. 2007  . Hypertension   . Low back pain     intermittent radiculopathy  . Osteoarthritis   . Pulmonary embolism     2/2 knee replacement  . Osteopenia     T score-1.1 R [place], - 0.4 L spine  . Hyperlipemia   . Polio 1942  . Urinary incontinence   . IBS (irritable bowel syndrome)     ROS: Constitutional:  Denies fevers, chills, diaphoresis, appetite change and fatigue.  Respiratory: Denies SOB, DOE, cough, chest tightness, and wheezing.  CVS: No chest pain, palpitations and leg swelling.  GI: No abdominal pain,  nausea, vomiting, bloody stools GU: No dysuria, frequency, hematuria, or flank pain.  MSK: No myalgias, back pain, joint swelling, arthralgias    Objective:  Physical Exam: Filed Vitals:   05/06/14 1441  BP: 149/76  Pulse: 68  Temp: 98 F (36.7 C)  TempSrc: Oral  Height: 5\' 6"  (1.676 m)  Weight: 192 lb 11.2 oz (87.408 kg)  SpO2: 100%   General: Well nourished. No acute distress.  HEENT: Normal oral mucosa. MMM. Visual fields are mostly normal but she does have some evidence of diplopia in the left eye. Lungs: CTA bilaterally. No wheezing. Heart: RRR; no extra sounds or murmurs  Abdomen: Non-distended, normal bowel sounds, soft, nontender; no hepatosplenomegaly  Extremities: No pedal edema. No joint swelling or tenderness. Neurologic: Normal EOM,  Alert and oriented x3. No obvious neurologic/cranial nerve deficits. Mini-Mental Status exam: She scores 24/30, which is consistent for mild congestive impairment.  Assessment & Plan:  Discussed case with Dr Josem KaufmannKlima. See problem based charting for assessment and plan.

## 2014-05-07 NOTE — Assessment & Plan Note (Addendum)
Assessment: Patient with history of complex visual hallucinations without marked psychiatric history. Symptoms have been present for several weeks. Suspicious medications including opiates have been discontinued however, symptoms have persisted. She has remained fully functional despite the symptoms. Her neuropsych exam is largely unremarkable. She does have mild cognitive impairment with MMSE score of 24/30. She has diplopia in the left eye otherwise the rest of her eye exam is normal. I did not perform a funduscopy. Potential etiologies of her symptoms include retinal disease and neurodegenerative disease. She does have a history of mild macular degeneration and she has not seen her ophthalmologist in the last 2 years. I do not suspect toxic metabolic or psychiatry causes. Medications have been largely excluded.  Plan: 1. Labs/imaging: none 2. Therapy: Referral to ophthalmology for vision assessment to rule out retinal disease 3. Follow up: If her ophthalmology evaluation is normal, the next step should be referral for neuropsychiatry evaluation by a neurologist or psychiatrist. I have discontinued all her nonessential medications including vitamin supplements. She will follow up after ophthalmology exam.

## 2014-05-09 NOTE — Progress Notes (Signed)
HPI:  Patient presents today for follow up of foot and nail care. Relates pain in her right great toenail today more so than usual.   Objective:  Patients chart is reviewed.  Vascular status reveals pedal pulses noted at 1 out of 4 dp and pt bilateral .  Neurological sensation is intact to [facility]il[facility] bilateral.  Patients nails are thickened, discolored, distrophic, friable and brittle with yellow-brown discoloration. Patient subjectively relates they are painful with shoes and with ambulation of bilateral feet. No redness, no swelling, no sign of infection is present right great toenail. Toenail curves at the distal tip the toe and likely is causing pain with shoe gear pressure.  Assessment:  Symptomatic onychomycosis  Plan:  Discussed treatment options and alternatives.  The symptomatic toenails were debrided through manual an mechanical means without complication.  Return appointment recommended at routine intervals of 3 months

## 2014-05-12 ENCOUNTER — Telehealth: Payer: Self-pay | Admitting: Internal Medicine

## 2014-05-12 NOTE — Telephone Encounter (Signed)
Call to patient to confirm appointment for 05/13/14 at 8:15 lmtcb

## 2014-05-13 ENCOUNTER — Ambulatory Visit: Payer: Medicare Other | Admitting: Internal Medicine

## 2014-05-13 ENCOUNTER — Encounter: Payer: Self-pay | Admitting: Internal Medicine

## 2014-06-11 DIAGNOSIS — I639 Cerebral infarction, unspecified: Secondary | ICD-10-CM | POA: Diagnosis not present

## 2014-06-11 DIAGNOSIS — H25013 Cortical age-related cataract, bilateral: Secondary | ICD-10-CM | POA: Diagnosis not present

## 2014-06-11 DIAGNOSIS — H25813 Combined forms of age-related cataract, bilateral: Secondary | ICD-10-CM | POA: Diagnosis not present

## 2014-06-16 ENCOUNTER — Encounter: Payer: Self-pay | Admitting: Internal Medicine

## 2014-06-16 ENCOUNTER — Ambulatory Visit (INDEPENDENT_AMBULATORY_CARE_PROVIDER_SITE_OTHER): Payer: Medicare Other | Admitting: Internal Medicine

## 2014-06-16 VITALS — BP 132/60 | HR 60 | Temp 97.9°F | Wt 190.3 lb

## 2014-06-16 DIAGNOSIS — Z0000 Encounter for general adult medical examination without abnormal findings: Secondary | ICD-10-CM

## 2014-06-16 DIAGNOSIS — E785 Hyperlipidemia, unspecified: Secondary | ICD-10-CM | POA: Diagnosis not present

## 2014-06-16 DIAGNOSIS — I639 Cerebral infarction, unspecified: Secondary | ICD-10-CM | POA: Diagnosis not present

## 2014-06-16 DIAGNOSIS — Z23 Encounter for immunization: Secondary | ICD-10-CM

## 2014-06-16 DIAGNOSIS — R441 Visual hallucinations: Secondary | ICD-10-CM | POA: Diagnosis not present

## 2014-06-16 DIAGNOSIS — I6350 Cerebral infarction due to unspecified occlusion or stenosis of unspecified cerebral artery: Secondary | ICD-10-CM

## 2014-06-16 LAB — LIPID PANEL
CHOL/HDL RATIO: 2.6 ratio
CHOLESTEROL: 243 mg/dL — AB (ref 0–200)
HDL: 95 mg/dL (ref >=46)
LDL Cholesterol: 127 mg/dL — ABNORMAL HIGH (ref 0–99)
Triglycerides: 103 mg/dL (ref <150)
VLDL: 21 mg/dL (ref 0–40)

## 2014-06-16 LAB — GLUCOSE, CAPILLARY: GLUCOSE-CAPILLARY: 97 mg/dL (ref 65–99)

## 2014-06-16 LAB — POCT GLYCOSYLATED HEMOGLOBIN (HGB A1C): HEMOGLOBIN A1C: 5.6

## 2014-06-16 MED ORDER — PNEUMOCOCCAL 13-VAL CONJ VACC IM SUSP
0.5000 mL | INTRAMUSCULAR | Status: DC
Start: 2014-06-17 — End: 2014-06-18

## 2014-06-16 NOTE — Progress Notes (Signed)
   Subjective:    Patient ID: Jennifer Owen, female    DOB: 02/12/1931, 81 y.o.   MRN: [id number]  HPI  81 yo female with hx of HTN, GERD, OA, HLD, recently seen for visual hallucinations [facility]  She was asked to see opthalmology about her visual hallucations. We have tried d/cing her opiates and robaxin without improvement of her hallucinations. She still had the visual hallucinations. It was thought that it was due to her visual impairment. She had left sided visual deficit. Was sent to Dr. Dione BoozeGroat, optho.  Dr. 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 She has total left homonymous hemianopsia on their assessment. has b/l seilne cataract. scheduled for cataract surgery on 06/23/14. will also follow up with Dr. Dione BoozeGroat about her right eye (I am trying to get records to get details about it).  She denies any confusion or other problems. Denies auditory hallucination, suicidal thoughts, numbness, tingling, weakness. No agitation or changes in mental status/delirium per family member.   Denies any other problems.  Review of Systems  Constitutional: Negative for fever and chills.  HENT: Negative for congestion and sore throat.   Eyes: Positive for visual disturbance. Negative for photophobia and pain.  Respiratory: Negative for cough, chest tightness, shortness of breath and wheezing.   Cardiovascular: Negative for chest pain, palpitations and leg swelling.  Gastrointestinal: Negative for nausea, vomiting and diarrhea.  Endocrine: Negative for polyuria.  Genitourinary: Negative.   Musculoskeletal:       Has her chronic arthritis pain.  Skin: Negative.   Allergic/Immunologic: Negative.   Neurological: Negative.   Hematological: Negative.   Psychiatric/Behavioral: Positive for hallucinations. Negative for agitation.         Objective:   Physical Exam  Constitutional: She is oriented to person, place, and time. She appears  well-developed and well-nourished. No distress.  HENT:  Head: Normocephalic and atraumatic.  Right Ear: External ear normal.  Left Ear: External ear normal.  Eyes: Conjunctivae and EOM are normal. Pupils are equal, round, and reactive to light. Right eye exhibits no discharge. Left eye exhibits no discharge. No scleral icterus.  Full eye exam deferred as she saw Dr. Dione BoozeGroat, optho, recently.   Neck: Normal range of motion.  Cardiovascular: Normal rate and regular rhythm.  Exam reveals no gallop and no friction rub.   No murmur heard. Pulmonary/Chest: Effort normal and breath sounds normal. No respiratory distress. She has no wheezes. She exhibits no tenderness.  Abdominal: Soft. Bowel sounds are normal. She exhibits no distension. There is no tenderness.  Musculoskeletal: Normal range of motion. She exhibits no edema or tenderness.  Neurological: She is alert and oriented to person, place, and time.  Sensation intact, strength 5/5 everywhere.   Skin: Skin is warm. She is not diaphoretic.  Psychiatric: She has a normal mood and affect. Her behavior is normal. Thought content normal.    Filed Vitals:   06/16/14 1335  BP: 132/60  Pulse: 60  Temp: 97.9 F (36.6 C)       Assessment & Plan:  See problem based a&p.

## 2014-06-16 NOTE — Assessment & Plan Note (Signed)
Due for 2nd prevnar shot. - did this today.  Dr. Dione BoozeGroat, otpho, ordered MRI and told her she had a stroke in the past.   patient brought in MRI CD with her for me to look at. I cannot find the interpretations. I asked my RN to get the reading from the imaging center.  will risk stratify: check hgba1c and lipid panel.

## 2014-06-16 NOTE — Assessment & Plan Note (Signed)
Saw Dr. Dione BoozeGroat. 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 Has total left homonymous hemianopsia. Has senile cataract, scheduled for left cataract surgery 06/23/14 and then also the right side in the future with Dr. Dione BoozeGroat.  they ordered MRI and told her she had a stroke in the past.   patient brought in MRI CD with her for me to look at. I cannot find the interpretations. I asked my RN to get the reading from the imaging center.  will risk stratify: check hgba1c and lipid panel. continue asa 81 for now.

## 2014-06-16 NOTE — Patient Instructions (Signed)
Please continue to take your aspirin 81mg  daily.  Continue to take your blood pressure meds as you are.  We will check your labs and let you know if you need to be on any new medications.  Continue to follow with Dr. Dione BoozeGroat.  I will get further details from your MRI.

## 2014-06-16 NOTE — Progress Notes (Signed)
Internal Medicine Clinic Attending  Case discussed with Dr. Ahmed at the time of the visit.  We reviewed the resident's history and exam and pertinent patient test results.  I agree with the assessment, diagnosis, and plan of care documented in the resident's note. 

## 2014-06-17 ENCOUNTER — Other Ambulatory Visit: Payer: Self-pay | Admitting: Internal Medicine

## 2014-06-17 DIAGNOSIS — I639 Cerebral infarction, unspecified: Secondary | ICD-10-CM

## 2014-06-17 DIAGNOSIS — Z8673 Personal history of transient ischemic attack (TIA), and cerebral infarction without residual deficits: Secondary | ICD-10-CM | POA: Insufficient documentation

## 2014-06-23 DIAGNOSIS — H268 Other specified cataract: Secondary | ICD-10-CM | POA: Diagnosis not present

## 2014-06-23 DIAGNOSIS — H2512 Age-related nuclear cataract, left eye: Secondary | ICD-10-CM | POA: Diagnosis not present

## 2014-06-23 MED ORDER — ATORVASTATIN CALCIUM 40 MG PO TABS: 40.0000 mg | ORAL_TABLET | Freq: Every day | ORAL | Status: DC

## 2014-06-23 NOTE — Assessment & Plan Note (Signed)
I finally got the chance to look at her MRI this morning and discussed the case with Dr. Rogelia Boga. It was not faxed to [place] until end of last week.   Patient has acute/subacute on chronic infarct on Right occipital lobe. This has already been 1 week since the MRI.   Already checked lipid panel and started on aspirin. Will start on high intensity statin.  Will ask patient to come to clinic for follow up of this. May do carotid doppler on follow up.

## 2014-06-23 NOTE — Addendum Note (Signed)
Addended by: Hyacinth Meeker on: 06/23/2014 01:35 PM   Modules accepted: Orders, Level of Service

## 2014-07-03 ENCOUNTER — Telehealth: Payer: Self-pay | Admitting: Internal Medicine

## 2014-07-03 NOTE — Telephone Encounter (Signed)
Call to patient to confirm appointment for 07/04/14 at 10:15 lmtcb

## 2014-07-04 ENCOUNTER — Ambulatory Visit (INDEPENDENT_AMBULATORY_CARE_PROVIDER_SITE_OTHER): Payer: Medicare Other | Admitting: Internal Medicine

## 2014-07-04 ENCOUNTER — Encounter: Payer: Self-pay | Admitting: Internal Medicine

## 2014-07-04 VITALS — BP 148/66 | HR 56 | Temp 98.0°F | Ht 66.0 in | Wt 190.0 lb

## 2014-07-04 DIAGNOSIS — R001 Bradycardia, unspecified: Secondary | ICD-10-CM

## 2014-07-04 DIAGNOSIS — Z8673 Personal history of transient ischemic attack (TIA), and cerebral infarction without residual deficits: Secondary | ICD-10-CM

## 2014-07-04 DIAGNOSIS — I10 Essential (primary) hypertension: Secondary | ICD-10-CM

## 2014-07-04 DIAGNOSIS — Z7982 Long term (current) use of aspirin: Secondary | ICD-10-CM | POA: Diagnosis not present

## 2014-07-04 DIAGNOSIS — I639 Cerebral infarction, unspecified: Secondary | ICD-10-CM

## 2014-07-04 NOTE — Patient Instructions (Signed)
1. Please schedule a follow up appointment for 6 weeks (after you get ECHO and carotid doppler).   Please schedule an appointment w/ Dr. Anne Fu (cardiologist).   2. Please take all medications as previously prescribed.  Please continue to take aspirin, lipitor, and your blood pressure medications daily.   3. If you have worsening of your symptoms or new symptoms arise, please call the clinic ([phone]), or go to the ER immediately if symptoms are severe.  You have done a great job in taking all your medications. Please continue to do this.

## 2014-07-04 NOTE — Assessment & Plan Note (Signed)
Asymptomatic. Seen by Dr. Anne Fu in 11/2013, recommended follow up in 6 months -Referral placed for cardiology

## 2014-07-04 NOTE — Assessment & Plan Note (Signed)
Right occipital stroke seen on recent MRI. Patient w/ some visual complaints initially, otherwise no neuro deficits. Denies any weakness, numbness, confusion, facial weakness, or difficulty speaking. Started on ASA + lipitor recently. HbA1c normal. Given recent CVA and activity level of this pleasant lady, feel it is reasonable to complete full stroke workup.  -ECHO -CA dopplers -Patient to follow up w/ Cardiology regarding ongoing bradycardia (recommended follow up at lest cardiology visit).  -Continue ASA + statin + BP control

## 2014-07-04 NOTE — Assessment & Plan Note (Signed)
BP Readings from Last 3 Encounters:  07/04/14 148/66  06/16/14 132/60  05/06/14 149/76    Lab Results  Component Value Date   NA 140 04/25/2014   K 4.1 04/25/2014   CREATININE 0.88 04/25/2014    Assessment: Blood pressure control:  Mildly elevated Comments: Reasonable elevation for 81 y/o F. Can consider adding Norvasc 5 mg daily if continues to be further elevated.   Plan: Medications:  continue current medications; Lisinopril 40 mg daily + HCTZ 25 mg daily. Given age, would be reasonable to change HCTZ to another medication in the future.  Educational resources provided: brochure (denies) Self management tools provided:   Other plans: RTC in 6 weeks

## 2014-07-04 NOTE — Progress Notes (Signed)
   Subjective:   Patient ID: Jennifer Owen female   DOB: [birth year] 81 y.o.   MRN: [id number]  HPI: [name] is a 81 y.o. female w/ PMHx of HTN, HLD, GERD, h/o urinary incontinence, low back pain, and osteopenia, presents to the clinic today for a follow-up visit regarding an acute/sub acute CVA in the right occipital lobe. During last visit, patient was started on Lipitor 40 mg daily + ASA 81 mg daily. HbA1c noted to be 5.6. States her vision has improved since she was seen last, denies any ongoing visual hallucinations. Is currently seeing Dr. Dione Booze for further management regarding her vision. She denies any significant weakness, paraesthesias, dizziness, SOB, chest pain, palpitations, confusion, or syncope. Does admit to some mild tingling in her 2nd and 3rd fingers on the right hand w/ some associated pain in her right wrist, most likely related to carpal tunnel.   Current Outpatient Prescriptions  Medication Sig Dispense Refill  . aspirin 81 MG chewable tablet Chew 81 mg by mouth daily.      Marland Kitchen atorvastatin (LIPITOR) 40 MG tablet Take 1 tablet (40 mg total) by mouth daily. 30 tablet 3  . hydrochlorothiazide (HYDRODIURIL) 25 MG tablet Take 1 tablet (25 mg total) by mouth daily. 30 tablet 11  . lisinopril (PRINIVIL,ZESTRIL) 40 MG tablet Take 1 tablet (40 mg total) by mouth daily. 30 tablet 11  . NEXIUM 40 MG capsule TAKE 1 CAPSULE BY MOUTH DAILY BEFORE BREAKFAST. 30 capsule 11  . [DISCONTINUED] simvastatin (ZOCOR) 20 MG tablet Take 20 mg by mouth every evening.     No current facility-administered medications for this visit.   Review of Systems  General: Denies fever, diaphoresis, appetite change, and fatigue.  Respiratory: Denies SOB, cough, and wheezing.   Cardiovascular: Denies chest pain and palpitations.  Gastrointestinal: Denies nausea, vomiting, abdominal pain, and diarrhea Musculoskeletal: Denies myalgias, arthralgias, back pain, and gait problem.  Neurological:  Denies dizziness, syncope, weakness, lightheadedness, and headaches.  Psychiatric/Behavioral: Denies mood changes, sleep disturbance, and agitation.   Objective:   Physical Exam: Filed Vitals:   07/04/14 0957  BP: 148/66  Pulse: 56  Temp: 98 F (36.7 C)  TempSrc: Oral  Height: 5\' 6"  (1.676 m)  Weight: 190 lb (86.183 kg)  SpO2: 100%    General: Very pleasant AA female, alert, cooperative, NAD. HEENT: PERRL, EOMI. Moist mucus membranes Neck: Full range of motion without pain, supple, no lymphadenopathy or carotid bruits Lungs: Clear to ascultation bilaterally, normal work of respiration, no wheezes, rales, rhonchi Heart: RRR, no murmurs, gallops, or rubs Abdomen: Soft, non-tender, non-distended, BS + Extremities: No cyanosis, clubbing, or edema Neurologic: Alert & oriented x3, cranial nerves II-XII intact, strength grossly intact, sensation intact to light touch   Assessment & Plan:   Please see problem based assessment and plan.

## 2014-07-07 DIAGNOSIS — H25811 Combined forms of age-related cataract, right eye: Secondary | ICD-10-CM | POA: Diagnosis not present

## 2014-07-07 DIAGNOSIS — H2511 Age-related nuclear cataract, right eye: Secondary | ICD-10-CM | POA: Diagnosis not present

## 2014-07-08 NOTE — Progress Notes (Signed)
Internal Medicine Clinic Attending  Case discussed with Dr. Jones soon after the resident saw the patient.  We reviewed the resident's history and exam and pertinent patient test results.  I agree with the assessment, diagnosis, and plan of care documented in the resident's note. 

## 2014-07-09 ENCOUNTER — Encounter: Payer: Self-pay | Admitting: *Deleted

## 2014-07-21 ENCOUNTER — Ambulatory Visit: Payer: Medicare Other | Admitting: Podiatry

## 2014-08-01 ENCOUNTER — Telehealth: Payer: Self-pay | Admitting: Internal Medicine

## 2014-08-01 NOTE — Telephone Encounter (Signed)
New Message ° °This message is to inform you that we have made 3 consecutive attempts to contact your patient. We were unsuccessful in these attempts and wanted you to be aware of our efforts. Will remove the patient from our referral work queue at this time. ° °Thanks ° °Shanti °[facility] ° °

## 2014-08-19 ENCOUNTER — Other Ambulatory Visit: Payer: Self-pay | Admitting: *Deleted

## 2014-08-21 ENCOUNTER — Encounter: Payer: Self-pay | Admitting: Cardiology

## 2014-08-21 ENCOUNTER — Ambulatory Visit (INDEPENDENT_AMBULATORY_CARE_PROVIDER_SITE_OTHER): Payer: Medicare Other | Admitting: Cardiology

## 2014-08-21 VITALS — BP 146/80 | HR 52 | Ht 64.0 in | Wt 192.8 lb

## 2014-08-21 DIAGNOSIS — R001 Bradycardia, unspecified: Secondary | ICD-10-CM | POA: Diagnosis not present

## 2014-08-21 DIAGNOSIS — E785 Hyperlipidemia, unspecified: Secondary | ICD-10-CM | POA: Diagnosis not present

## 2014-08-21 DIAGNOSIS — I10 Essential (primary) hypertension: Secondary | ICD-10-CM

## 2014-08-21 NOTE — Patient Instructions (Signed)
Medication Instructions:  Your physician recommends that you continue on your current medications as directed. Please refer to the Current Medication list given to you today.  Follow-Up: Follow up in 1 year with Dr. Skains.  You will receive a letter in the mail 2 months before you are due.  Please call us when you receive this letter to schedule your follow up appointment.  Thank you for choosing [facility]!!       

## 2014-08-21 NOTE — Progress Notes (Signed)
[address], [address] [place], [place]  [address] Phone: [phone] Fax:  [phone]  Date:  08/21/2014   ID:  Jennifer Owen, DOB [birth year], MRN [id number]  PCP:  Hyacinth Meeker, MD   History of Present Illness: Jennifer Owen is a 81 y.o. female follow-up of bradycardia heart rate was 46 bpm with no complaints. Repeat EKG showed heart rate of 50 with PVC. Asymptomatic, no syncope, no chest pain, no shortness of breath. She did state that she went to church 3 days in a row and on the last day she was feeling poorly. Overall doing well. She ambulates with a cane.  She takes no AV nodal blocking agents.  Had cataract surgery, doing well. Water aerobics.   Wt Readings from Last 3 Encounters:  08/21/14 192 lb 12 oz (87.431 kg)  07/04/14 190 lb (86.183 kg)  06/16/14 190 lb 4.8 oz (86.32 kg)     Past Medical History  Diagnosis Date  . GERD (gastroesophageal reflux disease)     Papulous gastropathy EGD Oct. 2007  . Hypertension   . Low back pain     intermittent radiculopathy  . Osteoarthritis   . Pulmonary embolism     2/2 knee replacement  . Osteopenia     T score-1.1 R Fremur, - 0.4 L spine  . Hyperlipemia   . Polio 1942  . Urinary incontinence   . IBS (irritable bowel syndrome)     Past Surgical History  Procedure Laterality Date  . Joint replacement      total knee - left 1995, right 2004  . Abdominal hysterectomy  1985  . Oophorectomy  1985    Current Outpatient Prescriptions  Medication Sig Dispense Refill  . aspirin 81 MG chewable tablet Chew 81 mg by mouth daily.      Marland Kitchen atorvastatin (LIPITOR) 40 MG tablet Take 1 tablet (40 mg total) by mouth daily. 30 tablet 3  . hydrochlorothiazide (HYDRODIURIL) 25 MG tablet Take 1 tablet (25 mg total) by mouth daily. 30 tablet 11  . lisinopril (PRINIVIL,ZESTRIL) 40 MG tablet Take 1 tablet (40 mg total) by mouth daily. 30 tablet 11  . NEXIUM 40 MG capsule TAKE 1 CAPSULE BY MOUTH DAILY BEFORE BREAKFAST.  30 capsule 11  . [DISCONTINUED] simvastatin (ZOCOR) 20 MG tablet Take 20 mg by mouth every evening.     No current facility-administered medications for this visit.    Allergies:    Allergies  Allergen Reactions  . Tramadol Hcl Nausea Only    stomach irritation    Social History:  The patient  reports that she has quit smoking. She does not have any smokeless tobacco history on file. She reports that she does not drink alcohol or use illicit drugs.   Family History  Problem Relation Age of Onset  . Cancer Daughter     colon caner, <60  . Cancer Mother     lung    ROS:  Please see the history of present illness.   No syncope, no shortness of breath, no chest pain, no excessive dizziness. No stroke symptoms, no bleeding.   All other systems reviewed and negative.   PHYSICAL EXAM: VS:  BP 146/80 mmHg  Pulse 52  Ht  (1.626 m)  Wt 192 lb 12 oz (87.431 kg)  BMI 33.07 kg/m2  SpO2 99% Well nourished, well developed, in no acute distress HEENT: normal, [place]/AT, EOMI Neck: no JVD, normal carotid upstroke, no bruit Cardiac:  normal S1, S2; RRR; no murmur(no bradycardia today) Lungs:  clear to auscultation bilaterally, no wheezing, rhonchi or rales Abd: soft, nontender, no hepatomegaly, no bruitsoverweight Ext: no edema, 2+ distal pulses Skin: warm and dry GU: deferred Neuro: no focal abnormalities noted, AAO x 3  EKG:  11/20/13-Sinus bradycardia 50 with PVC.    Labs: TSH normal, electrolytes normal, CBC normal ASSESSMENT AND PLAN:  1. Sinus bradycardia-asymptomatic. It is possible that prior pulse was lower than expected because of PVCs. Continue to avoid AV nodal blocking agent such as metoprolol or diltiazem. We discussed the possible needs for pacemaker in the future if she becomes symptomatic, syncope for instance. For now, conservative management. Her heart rate increased with mild activity. 2. Obesity-encourage weight loss. 3. Hypertension continue with current  medication, ACE inhibitor. 4. Hyperlipidemia-LDL 140-continue with statin. 5. We'll see back in 12 months.  Signed, Donato Schultz, MD [facility]  08/21/2014 9:29 AM

## 2014-10-16 ENCOUNTER — Encounter: Payer: Self-pay | Admitting: Internal Medicine

## 2014-10-20 ENCOUNTER — Ambulatory Visit (INDEPENDENT_AMBULATORY_CARE_PROVIDER_SITE_OTHER): Payer: Medicare Other | Admitting: Internal Medicine

## 2014-10-20 ENCOUNTER — Encounter: Payer: Self-pay | Admitting: Internal Medicine

## 2014-10-20 VITALS — BP 147/73 | HR 55 | Temp 97.9°F | Wt 188.0 lb

## 2014-10-20 DIAGNOSIS — L989 Disorder of the skin and subcutaneous tissue, unspecified: Secondary | ICD-10-CM | POA: Diagnosis not present

## 2014-10-20 DIAGNOSIS — R350 Frequency of micturition: Secondary | ICD-10-CM | POA: Insufficient documentation

## 2014-10-20 LAB — POCT URINALYSIS DIPSTICK
Bilirubin, UA: NEGATIVE
Glucose, UA: NEGATIVE
Ketones, UA: NEGATIVE
Nitrite, UA: NEGATIVE
PROTEIN UA: NEGATIVE
RBC UA: NEGATIVE
Spec Grav, UA: <=1.005
UROBILINOGEN UA: 0.2
pH, UA: 5

## 2014-10-20 NOTE — Progress Notes (Signed)
Patient ID: Jennifer Owen, female   DOB: July 27, 1931, 81 y.o.   MRN: [id number]   Subjective:   Patient ID: Jennifer Owen female   DOB: 1931/04/05 81 y.o.   MRN: [id number]  HPI: Jennifer Owen is a 81 y.o. female with past medical history of HTN, HLD, GERD, osteopenia, CVA who presents today for urinary frequency and a lesion on her back.  Patient states urinary frequency began 1-2 weeks ago and she currently denies any pain associated with the frequency.  No burning with urination, no CVA tenderness.  Patient does report that there may be an increased odor to her urine.  States that she is urinating almost hourly and waking up in the middle of the night to urinate. She has previous history of UTI last year at this time initially treated with TMP-SMX and then Ciprofloxacin after TMP-SMX did not resolve her symptoms.  States she is also having increased thirst and drinks approximately 5-6 bottles of water per day.  No history of diabetes with last Hgb A1c of 5.6 in June 2016.  She denies any fevers, chills, n/v/d.  Patient also complains of a lesion on her upper back, located in the midline.  Was concerned it was shingles.  She has received the shingles vaccine.  Denies pain to the area but states that it itches.  Has tried to relieve this with her daughter applying lotion to the area which seems to help.  She is not sure how long the mole has been there.  Denies any bleeding from the area.    Past Medical History  Diagnosis Date  . GERD (gastroesophageal reflux disease)     Papulous gastropathy EGD Oct. 2007  . Hypertension   . Low back pain     intermittent radiculopathy  . Osteoarthritis   . Pulmonary embolism (HCC)     2/2 knee replacement  . Osteopenia     T score-1.1 R Fremur, - 0.4 L spine  . Hyperlipemia   . Polio 1942  . Urinary incontinence   . IBS (irritable bowel syndrome)    Current Outpatient Prescriptions  Medication Sig Dispense Refill  . aspirin 81 MG  chewable tablet Chew 81 mg by mouth daily.      Marland Kitchen atorvastatin (LIPITOR) 40 MG tablet Take 1 tablet (40 mg total) by mouth daily. 30 tablet 3  . hydrochlorothiazide (HYDRODIURIL) 25 MG tablet Take 1 tablet (25 mg total) by mouth daily. 30 tablet 11  . lisinopril (PRINIVIL,ZESTRIL) 40 MG tablet Take 1 tablet (40 mg total) by mouth daily. 30 tablet 11  . NEXIUM 40 MG capsule TAKE 1 CAPSULE BY MOUTH DAILY BEFORE BREAKFAST. 30 capsule 11  . [DISCONTINUED] simvastatin (ZOCOR) 20 MG tablet Take 20 mg by mouth every evening.     No current facility-administered medications for this visit.   Family History  Problem Relation Age of Onset  . Cancer Daughter     colon caner, <60  . Cancer Mother     lung   Social History   Social History  . Marital Status: Legally Separated    Spouse Name: N/A  . Number of Children: 7  . Years of Education: N/A   Occupational History  . 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 [employer]    Social History Main Topics  . Smoking status: Former Games developer  . Smokeless tobacco: None  . Alcohol Use: No  . Drug Use: No  . Sexual Activity: Not Asked  Other Topics Concern  . None   Social History Narrative   [facility]   Review of Systems: Review of Systems  Constitutional: Negative for fever and chills.  HENT: Negative for congestion and sore throat.   Eyes: Negative for blurred vision and pain.  Respiratory: Negative for cough and shortness of breath.   Cardiovascular: Negative for chest pain and leg swelling.  Gastrointestinal: Negative for nausea, vomiting, abdominal pain and diarrhea.  Genitourinary: Positive for urgency and frequency. Negative for dysuria, hematuria and flank pain.  Musculoskeletal: Negative for back pain and falls.  Skin: Positive for itching (at site of mole on back).  Neurological: Negative for dizziness and headaches.  Psychiatric/Behavioral: Negative for depression.    Objective:  Physical Exam: Filed  Vitals:   10/20/14 1032  BP: 147/73  Pulse: 55  Temp: 97.9 F (36.6 C)  TempSrc: Oral  Weight: 188 lb (85.276 kg)   Physical Exam  Constitutional: She is oriented to person, place, and time. She appears well-developed and well-nourished.  HENT:  Head: Normocephalic and atraumatic.  Eyes: Conjunctivae and EOM are normal.  Neck: Normal range of motion.  Cardiovascular: Regular rhythm and normal heart sounds.   Pulmonary/Chest: Effort normal and breath sounds normal.  Abdominal: Soft. Bowel sounds are normal. There is no tenderness.  Musculoskeletal: Normal range of motion.  Lymphadenopathy:    She has no cervical adenopathy.  Neurological: She is alert and oriented to person, place, and time.  Skin:  0.25 x 0.25 cm round shaped, raised lesion located on the upper back in the midline.  Well demarcated, non-painful, non-bleeding, Christmas tree-like pattern with other similar appearing lesions.  Lesion has no assymetry, border irregularity, color variability.  Psychiatric: She has a normal mood and affect.    Assessment & Plan:  Please see Problem List for Assessment and Plan

## 2014-10-20 NOTE — Patient Instructions (Signed)
We will call you with the results of your urinalysis.  If you need antibiotics, we will send a prescription to your pharmacy.  I will call you with those results and let you know what to do.  Thank you for bringing your medicines today. This helps [place] keep you safe from mistakes.

## 2014-10-20 NOTE — Assessment & Plan Note (Signed)
Assessment: Patient also complains of a lesion on her upper back, located in the midline.  Was concerned it was shingles.  She has received the shingles vaccine.  Denies pain to the area but states that it itches.  Has tried to relieve this with her daughter applying lotion to the area which seems to help.  She is not sure how long the mole has been there.  Denies any bleeding from the area. On physical exam: 0.25 x 0.25 cm round shaped, raised lesion located on the upper back in the midline.  Well demarcated, non-painful, non-bleeding, Christmas tree-like pattern with other similar appearing lesions.  Lesion has no assymetry, border irregularity, color variability.   Plan: -likely seborrheic keratotic lesion with "Christmas tree" pattern seen on the back, possibly due to lesion distribution along the Blaschko lines -plan to monitor for now and watch for evolution of lesion

## 2014-10-20 NOTE — Assessment & Plan Note (Addendum)
Assessment:  Patient states urinary frequency began 1-2 weeks ago and she currently denies any pain associated with the frequency.  No burning with urination, no CVA tenderness.  Patient does report that there may be an increased odor to her urine.  States that she is urinating almost hourly and waking up in the middle of the night to urinate. She has previous history of UTI last year at this time initially treated with TMP-SMX and then Ciprofloxacin after TMP-SMX did not resolve her symptoms.  States she is also having increased thirst and drinks approximately 5-6 bottles of water per day.  No history of diabetes with last Hgb A1c of 5.6 in June 2016.  She denies any fevers, chills, n/v/d.  Urine dipstick with small amount leukocytes, negative nitrites.  Plan:  -given similar presentation last year, will check complete urinalysis and base treatment accordingly from those results  ADDENDUM 10/11: Urinalysis results with negative nitrites, 11-30 WBC on microscopy.  Given symptoms of increased frequency, will treat as uncomplicated UTI.  Patient was treated last year with TMP-SMX for 3 days for UTI that did not resolve symptoms.  Will avoid Nitrofurantoin given patients age and Fosfomycin given cost.  Will treat with cephalexin  BID x 7 days.

## 2014-10-21 ENCOUNTER — Telehealth: Payer: Self-pay | Admitting: Internal Medicine

## 2014-10-21 LAB — URINALYSIS, COMPLETE
BILIRUBIN UA: NEGATIVE
GLUCOSE, UA: NEGATIVE
KETONES UA: NEGATIVE
Nitrite, UA: NEGATIVE
PROTEIN UA: NEGATIVE
RBC UA: NEGATIVE
SPEC GRAV UA: 1.013 (ref 1.005–1.030)
UUROB: 0.2 mg/dL (ref 0.2–1.0)
pH, UA: 5.5 (ref 5.0–7.5)

## 2014-10-21 LAB — MICROSCOPIC EXAMINATION: CASTS: NONE SEEN /LPF

## 2014-10-21 MED ORDER — CEPHALEXIN 500 MG PO CAPS: 500.0000 mg | ORAL_CAPSULE | Freq: Two times a day (BID) | ORAL | Status: AC

## 2014-10-21 NOTE — Telephone Encounter (Signed)
Left VM with patient informing her of urinalysis results and prescription for cephalexin  BID x 7 days called to pharmacy.  Instructed patient to call back if any questions/concerns.

## 2014-10-21 NOTE — Progress Notes (Signed)
Internal Medicine Clinic Attending  I saw and evaluated the patient.  I personally confirmed the key portions of the history and exam documented by Dr. Wallace and I reviewed pertinent patient test results.  The assessment, diagnosis, and plan were formulated together and I agree with the documentation in the resident's note. 

## 2014-10-21 NOTE — Addendum Note (Signed)
Addended by: Kathlynn Grate on: 10/21/2014 09:42 AM   Modules accepted: Orders

## 2014-11-10 ENCOUNTER — Other Ambulatory Visit: Payer: Self-pay | Admitting: Internal Medicine

## 2014-11-10 ENCOUNTER — Other Ambulatory Visit: Payer: Self-pay | Admitting: Sports Medicine

## 2014-11-10 ENCOUNTER — Telehealth: Payer: Self-pay | Admitting: *Deleted

## 2014-11-10 DIAGNOSIS — M25551 Pain in right hip: Secondary | ICD-10-CM

## 2014-11-10 NOTE — Telephone Encounter (Signed)
[facility]

## 2014-11-21 ENCOUNTER — Ambulatory Visit
Admission: RE | Admit: 2014-11-21 | Discharge: 2014-11-21 | Disposition: A | Discharge status: Home / Self Care | Payer: Medicare Other | Source: Ambulatory Visit | Attending: Sports Medicine | Admitting: Sports Medicine

## 2014-11-21 DIAGNOSIS — M25551 Pain in right hip: Secondary | ICD-10-CM

## 2014-11-21 DIAGNOSIS — M461 Sacroiliitis, not elsewhere classified: Secondary | ICD-10-CM | POA: Diagnosis not present

## 2014-11-21 MED ORDER — IOHEXOL 180 MG/ML  SOLN: 1.0000 mL | Freq: Once | INTRAMUSCULAR | Status: DC | PRN

## 2014-11-21 MED ORDER — METHYLPREDNISOLONE ACETATE 40 MG/ML INJ SUSP (RADIOLOG: 120.0000 mg | Freq: Once | INTRAMUSCULAR | Status: DC

## 2014-12-03 DIAGNOSIS — Z961 Presence of intraocular lens: Secondary | ICD-10-CM | POA: Diagnosis not present

## 2014-12-03 DIAGNOSIS — H26491 Other secondary cataract, right eye: Secondary | ICD-10-CM | POA: Diagnosis not present

## 2014-12-03 DIAGNOSIS — H47649 Disorders of visual cortex in (due to) vascular disorders, unspecified side of brain: Secondary | ICD-10-CM | POA: Diagnosis not present

## 2014-12-03 DIAGNOSIS — R51 Headache: Secondary | ICD-10-CM | POA: Diagnosis not present

## 2015-02-03 ENCOUNTER — Telehealth: Payer: Self-pay | Admitting: Internal Medicine

## 2015-02-03 NOTE — Telephone Encounter (Signed)
CALLED TO CONFIRM NEXT APPT

## 2015-02-06 DIAGNOSIS — M5430 Sciatica, unspecified side: Secondary | ICD-10-CM | POA: Diagnosis not present

## 2015-03-06 ENCOUNTER — Telehealth: Payer: Self-pay | Admitting: Internal Medicine

## 2015-03-06 NOTE — Telephone Encounter (Signed)
APPT REMINDER CALL, LMTCB IF SHE NEEDS TO CANCEL °

## 2015-03-09 ENCOUNTER — Encounter: Payer: Self-pay | Admitting: Internal Medicine

## 2015-03-16 ENCOUNTER — Encounter: Payer: Self-pay | Admitting: Internal Medicine

## 2015-03-16 ENCOUNTER — Ambulatory Visit (INDEPENDENT_AMBULATORY_CARE_PROVIDER_SITE_OTHER): Payer: Medicare Other | Admitting: Internal Medicine

## 2015-03-16 VITALS — BP 187/73 | HR 65 | Temp 97.6°F | Wt 201.5 lb

## 2015-03-16 DIAGNOSIS — I10 Essential (primary) hypertension: Secondary | ICD-10-CM

## 2015-03-16 DIAGNOSIS — M150 Primary generalized (osteo)arthritis: Secondary | ICD-10-CM

## 2015-03-16 DIAGNOSIS — M159 Polyosteoarthritis, unspecified: Secondary | ICD-10-CM

## 2015-03-16 MED ORDER — DICLOFENAC SODIUM 1 % TD GEL: TRANSDERMAL | Status: AC

## 2015-03-16 MED ORDER — AMLODIPINE BESYLATE 5 MG PO TABS: 5.0000 mg | ORAL_TABLET | Freq: Every day | ORAL | Status: DC

## 2015-03-16 NOTE — Progress Notes (Signed)
   Subjective:    Patient ID: Jennifer Owen, female    DOB: 11/01/1931, 82 y.o.   MRN: [id number]  HPI  82 yo female with HTN, GERd, OA affecting multiple joints,  Presents for follow up for joint pain and also HTN.  Has diffuse joint pain ( knees, back, hands). Overall doing well with tylenol. Requesting to try voltaren gel for her knees. Doing group exercises. Did water aerobics in the past.   HTN: has been compliant with lisinopril 40mg  + hctz 25mg . BP remains elevated.    Review of Systems  Constitutional: Negative for chills and fatigue.  HENT: Negative for congestion and sore throat.   Eyes: Negative for photophobia and visual disturbance.  Respiratory: Negative for chest tightness and shortness of breath.   Cardiovascular: Negative for chest pain, palpitations and leg swelling.  Gastrointestinal: Negative for abdominal pain and abdominal distention.  Endocrine: Negative.   Genitourinary: Negative.  Negative for dysuria.  Musculoskeletal: Positive for arthralgias. Negative for joint swelling.  Skin: Negative.   Neurological: Negative for dizziness, light-headedness and headaches.  Psychiatric/Behavioral: Negative for behavioral problems and agitation.       Objective:   Physical Exam  Constitutional: She is oriented to person, place, and time. She appears well-developed and well-nourished. No distress.  HENT:  Head: Normocephalic and atraumatic.  Eyes: Conjunctivae are normal. Pupils are equal, round, and reactive to light.  Neck: Normal range of motion. No JVD present.  Cardiovascular: Normal rate, regular rhythm and normal heart sounds.  Exam reveals no gallop and no friction rub.   No murmur heard. Pulmonary/Chest: Effort normal and breath sounds normal. No respiratory distress. She has no wheezes.  Abdominal: Soft. Bowel sounds are normal.  Musculoskeletal: Normal range of motion. She exhibits no edema or tenderness.  Both knees appear normal on exam. No  erythema, swelling, or tenderness.   Neurological: She is alert and oriented to person, place, and time.  Skin: She is not diaphoretic.    Filed Vitals:   03/16/15 1320  BP: 187/73  Pulse: 65  Temp: 97.6 F (36.4 C)        Assessment & Plan:  See problem based a&p.

## 2015-03-16 NOTE — Patient Instructions (Signed)
Start taking amlodipine 5mg  daily as your blood pressure is still high. Continue the other medications as you are.  Try voltaren gel for your knee pain.

## 2015-03-17 NOTE — Assessment & Plan Note (Signed)
Doing well on prn tylenol. Getting regular exercise. I encouraged her to restart her water aerobics again. Patient will be doing that soon.  Gave her voltaren gel for knee pain.

## 2015-03-17 NOTE — Assessment & Plan Note (Signed)
Filed Vitals:   03/16/15 1320  BP: 187/73  Pulse: 65  Temp: 97.6 F (36.4 C)   BP remains elevated on lisinopril 40mg  daily + hctz 25mg . Will add amlodipine 5mg  daily to this regimen. Check in 1 month.

## 2015-03-18 NOTE — Progress Notes (Signed)
Internal Medicine Clinic Attending  Case discussed with Dr. Ahmed at the time of the visit.  We reviewed the resident's history and exam and pertinent patient test results.  I agree with the assessment, diagnosis, and plan of care documented in the resident's note. 

## 2015-04-01 ENCOUNTER — Other Ambulatory Visit: Payer: Self-pay | Admitting: Internal Medicine

## 2015-04-17 ENCOUNTER — Telehealth: Payer: Self-pay | Admitting: Internal Medicine

## 2015-04-17 NOTE — Telephone Encounter (Signed)
APPT. REMINDER CALL, LMTCB °

## 2015-04-20 ENCOUNTER — Ambulatory Visit (INDEPENDENT_AMBULATORY_CARE_PROVIDER_SITE_OTHER): Payer: Medicare Other | Admitting: Internal Medicine

## 2015-04-20 ENCOUNTER — Encounter: Payer: Self-pay | Admitting: Internal Medicine

## 2015-04-20 VITALS — BP 155/89 | HR 64 | Temp 98.0°F | Wt 192.7 lb

## 2015-04-20 DIAGNOSIS — I10 Essential (primary) hypertension: Secondary | ICD-10-CM | POA: Diagnosis not present

## 2015-04-20 DIAGNOSIS — Z79899 Other long term (current) drug therapy: Secondary | ICD-10-CM

## 2015-04-20 MED ORDER — AMLODIPINE BESYLATE 10 MG PO TABS: 10.0000 mg | ORAL_TABLET | Freq: Every day | ORAL | Status: DC

## 2015-04-20 NOTE — Assessment & Plan Note (Signed)
Filed Vitals:   04/20/15 1317  BP: 155/89  Pulse: 64  Temp: 98 F (36.7 C)   BP remains elevated on lisinopril 40mg  daily + hctz 25mg  and 5mg  daily.  Will increase amlodipine to 10mg  daily. F/up in 2 months.

## 2015-04-20 NOTE — Patient Instructions (Signed)
Keep taking your meds. Start doing water aerobics.  Increased amlodipine to 10mg  daily.

## 2015-04-20 NOTE — Progress Notes (Signed)
   Subjective:    Patient ID: Jennifer Owen, female    DOB: 1931-11-03, 82 y.o.   MRN: [id number]  HPI  82 yo female with HTN, GERd, OA affecting multiple joints,  Presents for follow up  HTN.   HTN: has been compliant with lisinopril 40mg  + hctz 25mg  and 5mg  amlodipine. BP remains elevated.    Review of Systems  Constitutional: Negative for chills and fatigue.  HENT: Negative for congestion and sore throat.   Eyes: Negative for photophobia and visual disturbance.  Respiratory: Negative for chest tightness and shortness of breath.   Cardiovascular: Negative for chest pain, palpitations and leg swelling.  Gastrointestinal: Negative for abdominal pain and abdominal distention.  Endocrine: Negative.   Genitourinary: Negative.  Negative for dysuria.  Skin: Negative.   Neurological: Negative for dizziness, light-headedness and headaches.       Objective:   Physical Exam  Constitutional: She is oriented to person, place, and time. She appears well-developed and well-nourished. No distress.  HENT:  Head: Normocephalic and atraumatic.  Eyes: Conjunctivae are normal. Pupils are equal, round, and reactive to light.  Neck: Normal range of motion. No JVD present.  Cardiovascular: Normal rate, regular rhythm and normal heart sounds.  Exam reveals no gallop and no friction rub.   No murmur heard. Pulmonary/Chest: Effort normal and breath sounds normal. No respiratory distress. She has no wheezes.  Abdominal: Soft. Bowel sounds are normal.  Musculoskeletal: Normal range of motion.  No edema  Neurological: She is alert and oriented to person, place, and time.  Skin: She is not diaphoretic.    Filed Vitals:   04/20/15 1317  BP: 155/89  Pulse: 64  Temp: 98 F (36.7 C)        Assessment & Plan:  See problem based a&p.

## 2015-04-21 ENCOUNTER — Telehealth: Payer: Self-pay | Admitting: *Deleted

## 2015-04-21 NOTE — Progress Notes (Signed)
Case discussed with Dr. Ahmed at the time of the visit. We reviewed the resident's history and exam and pertinent patient test results. I agree with the assessment, diagnosis, and plan of care documented in the resident's note. 

## 2015-04-22 NOTE — Telephone Encounter (Signed)
Received pa request for voltaren gel- request submitted online via Cover my meds-received faxed confirmation that request has been approved through 01/10/2016 under pt's part D benefit. Pharmacy aware.Jennifer Owen, Jennifer Osment [name]4/12/20179:47 AM          [facility] [phone] Pt ID: [id number]

## 2015-05-23 ENCOUNTER — Other Ambulatory Visit: Payer: Self-pay | Admitting: Internal Medicine

## 2015-06-22 ENCOUNTER — Encounter: Payer: Self-pay | Admitting: Internal Medicine

## 2015-06-22 ENCOUNTER — Other Ambulatory Visit: Payer: Self-pay | Admitting: Internal Medicine

## 2015-06-22 ENCOUNTER — Ambulatory Visit (INDEPENDENT_AMBULATORY_CARE_PROVIDER_SITE_OTHER): Payer: Medicare Other | Admitting: Internal Medicine

## 2015-06-22 VITALS — BP 138/66 | HR 63 | Temp 97.8°F | Ht 66.0 in | Wt 200.3 lb

## 2015-06-22 DIAGNOSIS — Z1231 Encounter for screening mammogram for malignant neoplasm of breast: Secondary | ICD-10-CM

## 2015-06-22 DIAGNOSIS — I10 Essential (primary) hypertension: Secondary | ICD-10-CM

## 2015-06-22 DIAGNOSIS — Z0000 Encounter for general adult medical examination without abnormal findings: Secondary | ICD-10-CM

## 2015-06-22 DIAGNOSIS — R195 Other fecal abnormalities: Secondary | ICD-10-CM | POA: Diagnosis not present

## 2015-06-22 MED ORDER — AMLODIPINE BESYLATE 10 MG PO TABS: 10.0000 mg | ORAL_TABLET | Freq: Every day | ORAL | Status: DC

## 2015-06-22 NOTE — Assessment & Plan Note (Signed)
Dark stool for 3-4 weeks. FOBT negative. Normal rectal exam. Will check CBC. Had cscope 2011 (rec every 10 year). On asa 81mg , no NSAIDS. On daily PPI.  - if CBC shows low hgb, will refer to GI. Otherwise will monitor.

## 2015-06-22 NOTE — Progress Notes (Signed)
   Subjective:    Patient ID: Jennifer Owen, female    DOB: 11/08/1931, 82 y.o.   MRN: [id number]  Knee Pain     82 yo female with HTN, GERd, CVA, OA affecting multiple joints,  Presents for follow up  HTN.   HTN: has been compliant with lisinopril 40mg  + hctz 25mg  and 10mg  daily. BP 136/66 on recheck. Compliant with low salt diet. Getting water exercise at the gym.   Having some dark stool for last 3-4 weeks. No dizziness, CP, sob. On nexium. No NSAID. No GERD symptoms. No bright red blood per rectum. Not taking iron supplement but does take multivitamin (not sure if it has iron).    Review of Systems  Constitutional: Negative for chills and fatigue.  HENT: Negative for congestion and sore throat.   Eyes: Negative for photophobia and visual disturbance.  Respiratory: Negative for chest tightness and shortness of breath.   Cardiovascular: Negative for chest pain, palpitations and leg swelling.  Gastrointestinal: Negative for nausea, abdominal pain, diarrhea, blood in stool, abdominal distention, anal bleeding and rectal pain.       +dark stool  Endocrine: Negative.   Genitourinary: Negative.  Negative for dysuria.  Musculoskeletal: Positive for arthralgias.  Skin: Negative.   Neurological: Negative for dizziness, light-headedness and headaches.       Objective:   Physical Exam  Constitutional: She is oriented to person, place, and time. She appears well-developed and well-nourished. No distress.  HENT:  Head: Normocephalic and atraumatic.  Eyes: Conjunctivae are normal. Pupils are equal, round, and reactive to light.  Neck: Normal range of motion. No JVD present.  Cardiovascular: Normal rate, regular rhythm and normal heart sounds.  Exam reveals no gallop and no friction rub.   No murmur heard. Pulmonary/Chest: Effort normal and breath sounds normal. No respiratory distress. She has no wheezes.  Abdominal: Soft. Bowel sounds are normal.  Genitourinary:  No dark stool  seen on rectal exam. No hemorrhoids or mass or lesions.   Musculoskeletal: Normal range of motion.  Neurological: She is alert and oriented to person, place, and time.  Skin: She is not diaphoretic.    Filed Vitals:   06/22/15 1317 06/22/15 1353  BP: 179/54 138/66  Pulse: 63   Temp: 97.8 F (36.6 C)         Assessment & Plan:  See problem based a&p.

## 2015-06-22 NOTE — Patient Instructions (Signed)
Good to see you.  We will check your blood. If your blood count is low, we will refer you to the stomach doctors.  I did not see any dark stool on my exam.   Continue taking same medications.

## 2015-06-22 NOTE — Assessment & Plan Note (Signed)
Ordered mammogram.

## 2015-06-22 NOTE — Assessment & Plan Note (Signed)
Filed Vitals:   06/22/15 1317 06/22/15 1353  BP: 179/54 138/66  Pulse: 63   Temp: 97.8 F (36.6 C)    BP well controlled on current regimen: continue amlodipine 10mg  , lisinopril 40mg  + hctz 25mg  daily.

## 2015-06-23 LAB — CBC
HEMATOCRIT: 38.1 % (ref 34.0–46.6)
Hemoglobin: 12.7 g/dL (ref 11.1–15.9)
MCH: 33.8 pg — ABNORMAL HIGH (ref 26.6–33.0)
MCHC: 33.3 g/dL (ref 31.5–35.7)
MCV: 101 fL — ABNORMAL HIGH (ref 79–97)
PLATELETS: 328 10*3/uL (ref 150–379)
RBC: 3.76 x10E6/uL — AB (ref 3.77–5.28)
RDW: 14.2 % (ref 12.3–15.4)
WBC: 5.7 10*3/uL (ref 3.4–10.8)

## 2015-06-23 NOTE — Progress Notes (Signed)
Internal Medicine Clinic Attending  Case discussed with Dr. Ahmed at the time of the visit.  We reviewed the resident's history and exam and pertinent patient test results.  I agree with the assessment, diagnosis, and plan of care documented in the resident's note. 

## 2015-07-07 ENCOUNTER — Ambulatory Visit: Payer: Self-pay

## 2015-08-26 ENCOUNTER — Other Ambulatory Visit: Payer: Self-pay | Admitting: Internal Medicine

## 2015-08-26 DIAGNOSIS — I10 Essential (primary) hypertension: Secondary | ICD-10-CM

## 2015-10-01 ENCOUNTER — Ambulatory Visit (INDEPENDENT_AMBULATORY_CARE_PROVIDER_SITE_OTHER): Payer: Medicare Other | Admitting: Internal Medicine

## 2015-10-01 ENCOUNTER — Telehealth: Payer: Self-pay | Admitting: Internal Medicine

## 2015-10-01 ENCOUNTER — Encounter: Payer: Self-pay | Admitting: Internal Medicine

## 2015-10-01 DIAGNOSIS — R51 Headache: Secondary | ICD-10-CM | POA: Diagnosis not present

## 2015-10-01 DIAGNOSIS — R519 Headache, unspecified: Secondary | ICD-10-CM

## 2015-10-01 DIAGNOSIS — Z23 Encounter for immunization: Secondary | ICD-10-CM

## 2015-10-01 NOTE — Telephone Encounter (Signed)
Pt states she does not feel well; has headache since Sunday, "hurts all over", rght hand/fingers numb. Appt given for today @ 1445 PM w/Dr Earlene PlaterWallace.

## 2015-10-01 NOTE — Assessment & Plan Note (Signed)
A: Patient with a headache this morning that resolved with Tylenol.  No focal deficits were noted at time of headache and none are appreciated during encounter.  She is mentating well with appropriate speech pattern and thought process.  Her BP is elevated at this time but not significantly to necessarily explain her headache.  Her twice weekly headaches since moving to [place] may be related to the stress of that living situation but if they were to persist would recommend imaging.  P: - reassured patient and recommended Tylenol as needed to abort future headaches - recommended follow up if her headaches were to continue, worsen in severity, or change in character - she has an appointment with her PCP on Monday and I recommended she keep that appointment

## 2015-10-01 NOTE — Patient Instructions (Signed)
Thank you for coming to see me today. It was a pleasure. Today we talked about:   Headaches: - you can take tylenol as needed to relieve your headache symptoms. - if you notice an increasing frequency or worsening severity of your headaches please let us know. - if you notice any acute slurring of speech or weakness or numbness please go to the emergency department.  Please follow-up with Dr. Tasia CatchingsAhmed on Monday.  If you have any questions or concerns, please do not hesitate to call the office at [phone].  Take Care,   Gwynn BurlyAndrew Cheron Coryell, DO

## 2015-10-01 NOTE — Progress Notes (Signed)
   CC: here for headache  HPI:  [name] is a 82 y.o. woman with a past medical history listed below here today for evaluation of headache.  Patient reports a headache with onset this morning.  Resolved with taking 2 Tylenol.  Located on the right side described as sharp and pounding.  No photophobia.  Prior to her headache she felt like her normal self.  She did not take her BP when having the headache.  She did not feel any tension in her neck muscles and did not have a sensation of having a tight hat around her forehead.  She denies any blurry vision, chest pain, SOB, loss of strength or sensation, falls, or trauma.  She feels fine right now.  Patient reports during the past couple months spending time in [place] with her niece and having a couple headaches per week.  She thinks it is due the stress of that living situation.  Those headaches were resolved with Tylenol as well.   Past Medical History:  Diagnosis Date  . GERD (gastroesophageal reflux disease)    Papulous gastropathy EGD Oct. 2007  . Hyperlipemia   . Hypertension   . IBS (irritable bowel syndrome)   . Low back pain    intermittent radiculopathy  . Osteoarthritis   . Osteopenia    T score-1.1 R Fremur, - 0.4 L spine  . Polio 1942  . Pulmonary embolism (HCC)    2/2 knee replacement  . Urinary incontinence     Review of Systems:   Please see pertinent ROS reviewed in HPI and problem based charting.   Physical Exam:  Vitals:   10/01/15 1424  BP: (!) 157/136  Pulse: 74  Temp: 98.1 F (36.7 C)  TempSrc: Oral  SpO2: 97%  Weight: 190 lb 4.8 oz (86.3 kg)  Height: 5' (1.524 m)   Physical Exam  Constitutional: She is oriented to person, place, and time and well-developed, well-nourished, and in no distress.  HENT:  Head: Normocephalic and atraumatic.  Eyes: EOM are normal. Right eye exhibits no discharge. Left eye exhibits no discharge.  Neck: Normal range of motion.  Neurological: She is alert and  oriented to person, place, and time. No cranial nerve deficit. Coordination normal.  Psychiatric: Mood and affect normal.     Assessment & Plan:   See Encounters Tab for problem based charting.  Patient discussed with Dr. Cleda DaubE. Hoffman.  Headache A: Patient with a headache this morning that resolved with Tylenol.  No focal deficits were noted at time of headache and none are appreciated during encounter.  She is mentating well with appropriate speech pattern and thought process.  Her BP is elevated at this time but not significantly to necessarily explain her headache.  Her twice weekly headaches since moving to [place] may be related to the stress of that living situation but if they were to persist would recommend imaging.  P: - reassured patient and recommended Tylenol as needed to abort future headaches - recommended follow up if her headaches were to continue, worsen in severity, or change in character - she has an appointment with her PCP on Monday and I recommended she keep that appointment

## 2015-10-01 NOTE — Telephone Encounter (Signed)
Wants nurse to call °

## 2015-10-01 NOTE — Telephone Encounter (Signed)
Thank you Glenda.. I agree with the plan 

## 2015-10-02 NOTE — Progress Notes (Signed)
Internal Medicine Clinic Attending  Case discussed with Dr. Wallace at the time of the visit.  We reviewed the resident's history and exam and pertinent patient test results.  I agree with the assessment, diagnosis, and plan of care documented in the resident's note.  

## 2015-10-05 ENCOUNTER — Ambulatory Visit (INDEPENDENT_AMBULATORY_CARE_PROVIDER_SITE_OTHER): Payer: Medicare Other | Admitting: Internal Medicine

## 2015-10-05 ENCOUNTER — Encounter: Payer: Self-pay | Admitting: Internal Medicine

## 2015-10-05 VITALS — BP 152/97 | HR 72 | Temp 98.0°F | Ht 66.0 in | Wt 191.8 lb

## 2015-10-05 DIAGNOSIS — R519 Headache, unspecified: Secondary | ICD-10-CM

## 2015-10-05 DIAGNOSIS — I10 Essential (primary) hypertension: Secondary | ICD-10-CM | POA: Diagnosis not present

## 2015-10-05 DIAGNOSIS — R51 Headache: Secondary | ICD-10-CM

## 2015-10-05 DIAGNOSIS — Z8673 Personal history of transient ischemic attack (TIA), and cerebral infarction without residual deficits: Secondary | ICD-10-CM | POA: Diagnosis not present

## 2015-10-05 DIAGNOSIS — Z79899 Other long term (current) drug therapy: Secondary | ICD-10-CM

## 2015-10-05 DIAGNOSIS — Z0000 Encounter for general adult medical examination without abnormal findings: Secondary | ICD-10-CM

## 2015-10-05 DIAGNOSIS — Z87891 Personal history of nicotine dependence: Secondary | ICD-10-CM

## 2015-10-05 MED ORDER — HYDROCHLOROTHIAZIDE 25 MG PO TABS: 25.0000 mg | ORAL_TABLET | Freq: Every day | ORAL | 11 refills | Status: DC

## 2015-10-05 MED ORDER — TETANUS-DIPHTH-ACELL PERTUSSIS 5-2.5-18.5 LF-MCG/0.5 IM SUSP
0.5000 mL | Freq: Once | INTRAMUSCULAR | Status: AC
  Administered 2015-10-05: 0.5 mL via INTRAMUSCULAR

## 2015-10-05 MED ORDER — AMLODIPINE BESYLATE 10 MG PO TABS: 10.0000 mg | ORAL_TABLET | Freq: Every day | ORAL | 2 refills | Status: DC

## 2015-10-05 NOTE — Assessment & Plan Note (Signed)
currently on lisinopril 40mg  daily + hctz 25mg  daily + amlodipine 10mg  daily. Has had some dietary indiscretion (ate salty dried meat skin, also missed HCTZ for few days). No vision changes or chest pain. BP slightly up 152/97.  Will cont same regimen for now. Asked her to keep a BP log.

## 2015-10-05 NOTE — Progress Notes (Signed)
Internal Medicine Clinic Attending  Case discussed with Dr. Ahmed at the time of the visit.  We reviewed the resident's history and exam and pertinent patient test results.  I agree with the assessment, diagnosis, and plan of care documented in the resident's note. 

## 2015-10-05 NOTE — Patient Instructions (Signed)
I am glad you are doing well.  Please start taking your HCTZ again.sent it to your pharmacy.  Keep checking your BP at home and keep a log.  Follow up in 3 months.  If your headache worsens, or you have new symptoms such as vision changes, numbness/ weakness, or other symptoms please call us.

## 2015-10-05 NOTE — Progress Notes (Signed)
   CC: f/up appt for headache, and HTN follow up  HPI:  [name] is a 82 y.o. with PMH as listed below is here for f/up on her recent visit for HA and also for HTN.  Past Medical History:  Diagnosis Date  . GERD (gastroesophageal reflux disease)    Papulous gastropathy EGD Oct. 2007  . Hyperlipemia   . Hypertension   . IBS (irritable bowel syndrome)   . Low back pain    intermittent radiculopathy  . Osteoarthritis   . Osteopenia    T score-1.1 R Fremur, - 0.4 L spine  . Polio 1942  . Pulmonary embolism (HCC)    2/2 knee replacement  . Urinary incontinence    HTN - currently on lisinopril 40mg  daily + hctz 25mg  daily + amlodipine 10mg  daily. Has had some dietary indiscretion (ate salty dried meat skin, also missed HCTZ for few days). No vision changes or chest pain. BP slightly up 152/97.  Headache - has intermittent HA, improves with tylenol. Started after her new 3 week grandchild was born because she is always worrying about her daughter and grandchild. No numbness/tingling/vision changes.   Review of Systems:   Review of Systems  Constitutional: Negative for chills and fever.  HENT: Negative for congestion and sore throat.   Respiratory: Negative for sputum production and shortness of breath.   Cardiovascular: Negative for chest pain and palpitations.  Neurological: Positive for headaches. Negative for dizziness, sensory change, speech change, focal weakness and loss of consciousness.     Physical Exam:  Vitals:   10/05/15 1327  BP: (!) 152/97  Pulse: 72  Temp: 98 F (36.7 C)  TempSrc: Oral  SpO2: 99%  Weight: 191 lb 12.8 oz (87 kg)  Height: 5\' 6"  (1.676 m)   Physical Exam  Constitutional: She is oriented to person, place, and time. She appears well-developed and well-nourished. No distress.  HENT:  Head: Normocephalic and atraumatic.  Mouth/Throat: Oropharynx is clear and moist.  Eyes: Conjunctivae are normal.  Cardiovascular: Normal rate and  regular rhythm.  Exam reveals no gallop and no friction rub.   No murmur heard. Respiratory: Effort normal and breath sounds normal. No respiratory distress. She has no wheezes.  Musculoskeletal: Normal range of motion. She exhibits no edema or deformity.  Neurological: She is alert and oriented to person, place, and time. No cranial nerve deficit.  Skin: She is not diaphoretic.    Assessment & Plan:   See Encounters Tab for problem based charting.  Patient discussed with Dr. Cleda DaubE. Hoffman

## 2015-10-05 NOTE — Assessment & Plan Note (Signed)
Gave tdap booster.

## 2015-10-05 NOTE — Assessment & Plan Note (Signed)
HA is most likely from recent stress. Al though this is a new headache for her, she has no alarming symptoms or signs on exam. Will defer neuro imaging for now. If this continues in the future or gets worse or she has new symptoms, will consider urgent imaging that time.   Cont tylenol prn for now.

## 2015-10-05 NOTE — Assessment & Plan Note (Signed)
Hx of stroke

## 2015-12-02 DIAGNOSIS — Z961 Presence of intraocular lens: Secondary | ICD-10-CM | POA: Diagnosis not present

## 2015-12-02 DIAGNOSIS — Z8673 Personal history of transient ischemic attack (TIA), and cerebral infarction without residual deficits: Secondary | ICD-10-CM | POA: Diagnosis not present

## 2016-01-15 ENCOUNTER — Telehealth: Payer: Self-pay | Admitting: Internal Medicine

## 2016-01-15 NOTE — Telephone Encounter (Signed)
APT. REMINDER CALL, LMTCB °

## 2016-01-18 ENCOUNTER — Encounter: Payer: Self-pay | Admitting: Internal Medicine

## 2016-01-19 ENCOUNTER — Ambulatory Visit (INDEPENDENT_AMBULATORY_CARE_PROVIDER_SITE_OTHER): Payer: Medicare Other | Admitting: Internal Medicine

## 2016-01-19 ENCOUNTER — Encounter: Payer: Self-pay | Admitting: Internal Medicine

## 2016-01-19 DIAGNOSIS — Z87891 Personal history of nicotine dependence: Secondary | ICD-10-CM | POA: Diagnosis not present

## 2016-01-19 DIAGNOSIS — J069 Acute upper respiratory infection, unspecified: Secondary | ICD-10-CM

## 2016-01-19 DIAGNOSIS — B349 Viral infection, unspecified: Secondary | ICD-10-CM

## 2016-01-19 NOTE — Assessment & Plan Note (Signed)
Reports that 2 weeks ago she starting having a sore throat, rhinorrhea, and cough. Productive cough with white sputum. Had some sinus congestion with sinus pressure and headache. Had myalgias. No fevers or chills, shortness of breath, nausea or vomiting. Feeling much better now. Reports that she made an appointment today because her family was concerned about her and made her come get seen.   Still having a cough with scant sputum production but symptoms improving. Overall, feeling much better today. Eating and drinking well. Had flu vaccine this year.   PLAN Appears to have a resolving viral URI, most likely the flu. Symptoms are improving. Continue symptomatic treatment as needed. Discussed returned precautions for any possible post viral PNA.

## 2016-01-19 NOTE — Patient Instructions (Signed)
[name],   Jennifer QuinYou are doing great today. I think you may have had the flu or another viral illness a few weeks ago that caused all of your symptoms. It appears that your symptoms are improving and there is nothing to be concerned about today. Continue to take the over the counter medications as needed.   If you start having shortness of breath, increase in sputum production or change in color, or start feeling worse again please call us to be seen.

## 2016-01-19 NOTE — Progress Notes (Signed)
   CC: Sore Throat  HPI:  Jennifer Owen is a 83 y.o. female with a past medical history listed below here today for sore throat.  Reports that 2 weeks ago she starting having a sore throat, rhinorrhea, and cough. Productive cough with white sputum. Had some sinus congestion with sinus pressure and headache. Had myalgias. No fevers or chills, shortness of breath, nausea or vomiting. Feeling much better now. Reports that she made an appointment today because her family was concerned about her and made her come get seen.   Still having a cough with scant sputum production but symptoms improving. Overall, feeling much better today. Eating and drinking well. Had flu vaccine this year.   Past Medical History:  Diagnosis Date  . GERD (gastroesophageal reflux disease)    Papulous gastropathy EGD Oct. 2007  . Hyperlipemia   . Hypertension   . IBS (irritable bowel syndrome)   . Low back pain    intermittent radiculopathy  . Osteoarthritis   . Osteopenia    T score-1.1 R Fremur, - 0.4 L spine  . Polio 1942  . Pulmonary embolism (HCC)    2/2 knee replacement  . Urinary incontinence     Review of Systems:   Negative except as noted in HPI  Physical Exam:  Vitals:   01/19/16 1504  BP: 125/68  Pulse: 78  Temp: 97.7 F (36.5 C)  TempSrc: Oral  SpO2: 99%  Weight: 168 lb 12.8 oz (76.6 kg)  Height: 5' 6.5" (1.689 m)   Physical Exam  Constitutional: She is well-developed, well-nourished, and in no distress. No distress.  HENT:  Head: Normocephalic and atraumatic.  Right Ear: External ear normal.  Left Ear: External ear normal.  Nose: Nose normal.  Mouth/Throat: Oropharynx is clear and moist. No oropharyngeal exudate.  Eyes: Conjunctivae are normal. Pupils are equal, round, and reactive to light.  Cardiovascular: Normal rate, regular rhythm and normal heart sounds.   Pulmonary/Chest: Effort normal and breath sounds normal. No respiratory distress. She has no wheezes.    Abdominal: Soft. Bowel sounds are normal. She exhibits no distension. There is no tenderness.  Lymphadenopathy:    She has no cervical adenopathy.  Skin: Skin is warm and dry.   Assessment & Plan:   See Encounters Tab for problem based charting.  Patient discussed with Dr. Cleda DaubE. Hoffman

## 2016-01-20 NOTE — Progress Notes (Signed)
Internal Medicine Clinic Attending  Case discussed with Dr. Boswell at the time of the visit.  We reviewed the resident's history and exam and pertinent patient test results.  I agree with the assessment, diagnosis, and plan of care documented in the resident's note.  

## 2016-01-28 NOTE — Addendum Note (Signed)
Addended by: Hollie SalkBOSWELL, Britne Borelli L on: 01/28/2016 06:23 PM   Modules accepted: Level of Service

## 2016-04-06 ENCOUNTER — Other Ambulatory Visit: Payer: Self-pay | Admitting: Internal Medicine

## 2016-05-05 ENCOUNTER — Other Ambulatory Visit: Payer: Self-pay | Admitting: Internal Medicine

## 2016-06-03 ENCOUNTER — Other Ambulatory Visit: Payer: Self-pay | Admitting: Internal Medicine

## 2016-06-03 DIAGNOSIS — I10 Essential (primary) hypertension: Secondary | ICD-10-CM

## 2016-06-16 ENCOUNTER — Encounter: Payer: Self-pay | Admitting: *Deleted

## 2016-07-25 ENCOUNTER — Other Ambulatory Visit: Payer: Self-pay

## 2016-07-26 MED ORDER — LISINOPRIL 40 MG PO TABS: ORAL_TABLET | ORAL | 5 refills | Status: DC

## 2016-08-14 NOTE — Progress Notes (Deleted)
   CC: Hypertension follow up  HPI:  Ms.Tanay E Juanetta GoslingHawkins is a 83 y.o. with PMH of GERD, HTN, HLD, and history of ischemic stroke who presents for evaluation of chronic medical conditions  Past Medical History:  Diagnosis Date  . GERD (gastroesophageal reflux disease)    Papulous gastropathy EGD Oct. 2007  . Hyperlipemia   . Hypertension   . IBS (irritable bowel syndrome)   . Low back pain    intermittent radiculopathy  . Osteoarthritis   . Osteopenia    T score-1.1 R Fremur, - 0.4 L spine  . Polio 1942  . Pulmonary embolism (HCC)    2/2 knee replacement  . Urinary incontinence    Review of Systems:  ***  Physical Exam:  There were no vitals filed for this visit. ***  Assessment & Plan:   See Encounters Tab for problem based charting.  Patient {GC/GE:[id number]::"discussed with","seen with"} Dr. {NAMES:[id number]::"Butcher","Granfortuna","E. Hoffman","Klima","Mullen","Narendra","Raines","Vincent"}

## 2016-08-15 ENCOUNTER — Encounter: Payer: Self-pay | Admitting: Internal Medicine

## 2016-10-03 ENCOUNTER — Ambulatory Visit (INDEPENDENT_AMBULATORY_CARE_PROVIDER_SITE_OTHER): Payer: Medicare Other | Admitting: Internal Medicine

## 2016-10-03 VITALS — BP 122/61 | HR 65 | Temp 97.6°F | Ht 66.5 in | Wt 194.3 lb

## 2016-10-03 DIAGNOSIS — R51 Headache: Secondary | ICD-10-CM | POA: Diagnosis not present

## 2016-10-03 DIAGNOSIS — Z87891 Personal history of nicotine dependence: Secondary | ICD-10-CM

## 2016-10-03 DIAGNOSIS — Z8673 Personal history of transient ischemic attack (TIA), and cerebral infarction without residual deficits: Secondary | ICD-10-CM | POA: Diagnosis not present

## 2016-10-03 DIAGNOSIS — R32 Unspecified urinary incontinence: Secondary | ICD-10-CM

## 2016-10-03 DIAGNOSIS — I10 Essential (primary) hypertension: Secondary | ICD-10-CM | POA: Diagnosis not present

## 2016-10-03 DIAGNOSIS — Z23 Encounter for immunization: Secondary | ICD-10-CM

## 2016-10-03 DIAGNOSIS — K589 Irritable bowel syndrome without diarrhea: Secondary | ICD-10-CM | POA: Diagnosis not present

## 2016-10-03 DIAGNOSIS — E785 Hyperlipidemia, unspecified: Secondary | ICD-10-CM

## 2016-10-03 DIAGNOSIS — Z9181 History of falling: Secondary | ICD-10-CM | POA: Diagnosis not present

## 2016-10-03 DIAGNOSIS — K219 Gastro-esophageal reflux disease without esophagitis: Secondary | ICD-10-CM

## 2016-10-03 DIAGNOSIS — Z79899 Other long term (current) drug therapy: Secondary | ICD-10-CM | POA: Diagnosis not present

## 2016-10-03 DIAGNOSIS — R202 Paresthesia of skin: Secondary | ICD-10-CM | POA: Diagnosis not present

## 2016-10-03 DIAGNOSIS — R011 Cardiac murmur, unspecified: Secondary | ICD-10-CM

## 2016-10-03 DIAGNOSIS — M79644 Pain in right finger(s): Secondary | ICD-10-CM

## 2016-10-03 MED ORDER — ATORVASTATIN CALCIUM 40 MG PO TABS: 40.0000 mg | ORAL_TABLET | Freq: Every day | ORAL | 3 refills | Status: DC

## 2016-10-03 MED ORDER — ESOMEPRAZOLE MAGNESIUM 40 MG PO CPDR: DELAYED_RELEASE_CAPSULE | ORAL | 3 refills | Status: DC

## 2016-10-03 MED ORDER — LISINOPRIL 40 MG PO TABS: ORAL_TABLET | ORAL | 5 refills | Status: DC

## 2016-10-03 MED ORDER — HYDROCHLOROTHIAZIDE 25 MG PO TABS: 25.0000 mg | ORAL_TABLET | Freq: Every day | ORAL | 11 refills | Status: DC

## 2016-10-03 MED ORDER — AMLODIPINE BESYLATE 10 MG PO TABS: 10.0000 mg | ORAL_TABLET | Freq: Every day | ORAL | 3 refills | Status: DC

## 2016-10-03 NOTE — Assessment & Plan Note (Addendum)
Patient's BP is at goal of <140/90 today. Patient endorses intermittent dizziness while standing too fast, which may be a result of intermittent hypotension however this dose not interfere with ADL's or result in syncope. Patient has not had BMP to assess kidney function in multiple years. Will obtain this today.  Plan: -BMP today -Continue current regimen of lisinopril 40 mg, HCTZ 25 mg, and amlodipine 10 mg daily

## 2016-10-03 NOTE — Patient Instructions (Signed)
Thank you for seeing Jennifer Owen today!  You are doing great with your blood pressure and other medications. Please continue taking them as previously prescribed.  Please return to clinic in 3-6 months for follow up of your chronic medical conditions. You can return sooner if you develop fever, cold symptoms, shortness of breath, or any other medical problems.

## 2016-10-03 NOTE — Assessment & Plan Note (Signed)
Patient states she has been on current regimen for many years and asymptomatic as a result. She states she knows when she misses this medication, as her symptoms of burning in her chest, especially while laying down, return. She would like a refill for this today.  Plan: -Continue esomeprazole 40 mg daily

## 2016-10-03 NOTE — Assessment & Plan Note (Signed)
Patient states she has no current difficulties with vision but endorses intermittent headaches. She states that the headaches resolve with PRN tylenol. Her neuro exam is non focal and reassuring that she is not having recurrent stroke or other worrisome neurologic problem. Will defer further imaging at this time, but will obtain lipid panel to ensure her stroke prevention regimen is decreasing LDL to appropriate levels.  Plan: -Obtain lipid panel today -Continue atorvastatin 40 mg daily unless lipid panel does not show appropriate reduction in LDL levels on this current dose.

## 2016-10-03 NOTE — Progress Notes (Signed)
   CC: HTN follow up  HPI:  [name] is a 83 y.o. with PMH of GERD, HTN, HLD, IBS, and urinary incontinence who is presenting today for management of her chronic medical conditions. She was last seen by her primary care physician on 10/05/15. At which time she had missed a few doses of HCTZ and her BP was elevated above goal. Her current BP regimen includes lisinopril 40 mg, HCTZ 25 mg, and amlodipine 10 mg daily.   Since her last visit, [name] has felt well. She has been able to take all of her medications without difficulty, as her daughter that she lives with helps set up her medications every day. She states that she has experienced intermittent headaches which are not associated with changes in vision, nausea, or focal weakness and resolve with use of OTC Tylenol. The patient also describes intermittent dizziness if she gets up too fast. This usually self-resolves. The patient describes a mechanical fall from her bed about 3 months ago, but denies needing to seek care after this fall since she did not hit her head or injure herself in any way. She endorses intermittent numbness/tingling on the tips of her right fingers which self resolves in 1 minute and some pain in her R pointer finger. Both of these symptoms began about 3 weeks ago when she started painting and overusing her hands as a result. Patient denies current numbness and tingling and states her pain has improved since she stopped painting so much.  Past Medical History:  Diagnosis Date  . GERD (gastroesophageal reflux disease)    Papulous gastropathy EGD Oct. 2007  . Hyperlipemia   . Hypertension   . IBS (irritable bowel syndrome)   . Low back pain    intermittent radiculopathy  . Osteoarthritis   . Osteopenia    T score-1.1 R Fremur, - 0.4 L spine  . Polio 1942  . Pulmonary embolism (HCC)    2/2 knee replacement  . Urinary incontinence    Review of Systems:   Patient endorses intermittent headaches,  tingling in distal right fingers, and pain in R pointer finger, as per HPI Patient denies chest pain, shortness of breath, orthopnea, PND, abdominal pain, diaphoresis, nausea/vomiting, lower extremity swelling, and change in bowel/bladder habits.  Physical Exam:  There were no vitals filed for this visit.  Physical Exam  Constitutional: She appears well-developed and well-nourished. No distress.  Eyes: Conjunctivae and EOM are normal.  Cardiovascular: Normal rate and regular rhythm.  Exam reveals no friction rub.   Murmur (holosystolic murmur at L sternal border) heard. Pulmonary/Chest: Effort normal and breath sounds normal. No respiratory distress. She has no wheezes. She has no rales.  Musculoskeletal: She exhibits no edema (of bilateral lower extremities) or tenderness (of bilateral lower extremities).  Neurological:  Face strength and sensation intact bilaterally. Tongue midline.Gross strength of upper and lower extremities intact bilaterally. Gross sensation to light touch of upper and lower extremities intact bilaterally. RAM within normal limits bilaterally.  Skin: Skin is warm and dry. No rash noted. No erythema.  Psychiatric: Her behavior is normal. Judgment and thought content normal.   Assessment & Plan:   See Encounters Tab for problem based charting.  Patient seen with Dr. Heide Spark.

## 2016-10-04 LAB — BMP8+ANION GAP
Anion Gap: 16 mmol/L (ref 10.0–18.0)
BUN / CREAT RATIO: 16 (ref 12–28)
BUN: 17 mg/dL (ref 8–27)
CO2: 22 mmol/L (ref 20–29)
CREATININE: 1.06 mg/dL — AB (ref 0.57–1.00)
Calcium: 10.2 mg/dL (ref 8.7–10.3)
Chloride: 106 mmol/L (ref 96–106)
GFR, EST AFRICAN AMERICAN: 56 mL/min/{1.73_m2} — AB (ref >59)
GFR, EST NON AFRICAN AMERICAN: 49 mL/min/{1.73_m2} — AB (ref >59)
Glucose: 99 mg/dL (ref 65–99)
POTASSIUM: 4 mmol/L (ref 3.5–5.2)
SODIUM: 144 mmol/L (ref 134–144)

## 2016-10-04 LAB — LIPID PANEL
CHOL/HDL RATIO: 1.5 ratio (ref 0.0–4.4)
Cholesterol, Total: 169 mg/dL (ref 100–199)
HDL: 116 mg/dL (ref >39)
LDL CALC: 39 mg/dL (ref 0–99)
TRIGLYCERIDES: 71 mg/dL (ref 0–149)
VLDL Cholesterol Cal: 14 mg/dL (ref 5–40)

## 2016-10-10 NOTE — Progress Notes (Signed)
Internal Medicine Clinic Attending  I saw and evaluated the patient.  I personally confirmed the key portions of the history and exam documented by Dr. Nedrud and I reviewed pertinent patient test results.  The assessment, diagnosis, and plan were formulated together and I agree with the documentation in the resident's note.  

## 2016-10-17 ENCOUNTER — Ambulatory Visit: Payer: Medicare Other | Admitting: Podiatry

## 2016-11-08 ENCOUNTER — Ambulatory Visit (INDEPENDENT_AMBULATORY_CARE_PROVIDER_SITE_OTHER): Payer: Medicare Other | Admitting: Podiatry

## 2016-11-08 ENCOUNTER — Encounter: Payer: Self-pay | Admitting: Podiatry

## 2016-11-08 VITALS — BP 130/73 | HR 63

## 2016-11-08 DIAGNOSIS — B351 Tinea unguium: Secondary | ICD-10-CM

## 2016-11-08 DIAGNOSIS — G609 Hereditary and idiopathic neuropathy, unspecified: Secondary | ICD-10-CM

## 2016-11-08 NOTE — Progress Notes (Signed)
   Subjective:    Patient ID: Jennifer Owen, female    DOB: 12/25/1931, 83 y.o.   MRN: [id number]  HPI This patient presents today complaining that she's unable to transfer thickened toenails which or become more difficult last 3-6 months. Patient has history of repetitive nail debridement, however, last visit in our office was on 04/17/2014    Review of Systems  All other systems reviewed and are negative.      Objective:   Physical Exam  Orientated 3 DP pulses 2/4 bilaterally PT pulses 1/4 bilaterally Reflex within normal limits bilaterally Sensation to 10 g monofilament wire intact 4/8 right 5/8 left Vibratory sensation reactive right nonreactive left Ankle reflexes reactive bilaterally No open skin lesions bilaterally Atrophic skin with absent hair growth bilaterally Incurvated, hypertrophic, discolored toenails 6-10 Hammertoe second left Manual motor testing dorsi flexion, plantar flexion 5/5 bilaterally      Assessment & Plan:   Assessment: Idiopathic peripheral neuropathy Mycotic toenails 6-10  Plan: Debridement toenails 6-10 mechanically analytics without any bleeding  Reappoint at patient's request

## 2017-03-07 ENCOUNTER — Ambulatory Visit (INDEPENDENT_AMBULATORY_CARE_PROVIDER_SITE_OTHER): Payer: Medicare Other | Admitting: Internal Medicine

## 2017-03-07 VITALS — BP 134/79 | HR 64 | Wt 191.7 lb

## 2017-03-07 DIAGNOSIS — Z9842 Cataract extraction status, left eye: Secondary | ICD-10-CM | POA: Diagnosis not present

## 2017-03-07 DIAGNOSIS — F0390 Unspecified dementia without behavioral disturbance: Secondary | ICD-10-CM | POA: Diagnosis not present

## 2017-03-07 DIAGNOSIS — K219 Gastro-esophageal reflux disease without esophagitis: Secondary | ICD-10-CM | POA: Diagnosis not present

## 2017-03-07 DIAGNOSIS — G308 Other Alzheimer's disease: Principal | ICD-10-CM

## 2017-03-07 DIAGNOSIS — Z9841 Cataract extraction status, right eye: Secondary | ICD-10-CM | POA: Diagnosis not present

## 2017-03-07 DIAGNOSIS — K589 Irritable bowel syndrome without diarrhea: Secondary | ICD-10-CM

## 2017-03-07 DIAGNOSIS — R32 Unspecified urinary incontinence: Secondary | ICD-10-CM | POA: Diagnosis not present

## 2017-03-07 DIAGNOSIS — F0280 Dementia in other diseases classified elsewhere without behavioral disturbance: Secondary | ICD-10-CM

## 2017-03-07 DIAGNOSIS — I10 Essential (primary) hypertension: Secondary | ICD-10-CM | POA: Diagnosis not present

## 2017-03-07 DIAGNOSIS — E785 Hyperlipidemia, unspecified: Secondary | ICD-10-CM | POA: Diagnosis not present

## 2017-03-07 MED ORDER — MEMANTINE HCL 5 MG PO TABS: 5.0000 mg | ORAL_TABLET | Freq: Every day | ORAL | 1 refills | Status: DC

## 2017-03-07 NOTE — Progress Notes (Signed)
   CC: gait problem   HPI:  [name] is a 84 y.o. with PMH GERD, HTN, HLD, IBS, and urinary incontinence who presents for acute concern of a gait problem. Please see the assessment and plans for the status of the patient chronic medical problems.    Past Medical History:  Diagnosis Date  . GERD (gastroesophageal reflux disease)    Papulous gastropathy EGD Oct. 2007  . Hyperlipemia   . Hypertension   . IBS (irritable bowel syndrome)   . Low back pain    intermittent radiculopathy  . Osteoarthritis   . Osteopenia    T score-1.1 R Fremur, - 0.4 L spine  . Polio 1942  . Pulmonary embolism (HCC)    2/2 knee replacement  . Urinary incontinence    Review of Systems:  Refer to history of present illness and assessment and plans for pertinent review of systems, all others reviewed and negative   Physical Exam:  Vitals:   03/07/17 0957  BP: 134/79  Pulse: 64  SpO2: 100%  Weight: 191 lb 11.2 oz (87 kg)   General: well appearing, no acute distress, sitting in a wheelchair  Psych: smiling and laughing pleasantly throughout the exam  Neuro: oriented to person, no cogwheel rigidity   Assessment & Plan:   Dementia  Jennifer Owen presents today with her daughters who are concerned for worsening confusion.  The confusion is been progressively worsening over the past 8 years.  At this point she is able to  toileting and showering on her own but is no longer able to cook, paid bills, grocery shopping, Make phone calls, or organize medications on her own. When the confusion first began she was having visual hallucinations of people outside of her home and car which were disturbing to her.  She was diagnosed with cataracts and when these were removed the visual hallucinations improved.  She still sees crawling bugs at times, these hallucinations are not disturbing to her. The results of this are scanned into the chart.  This is most likely alzheimer's dementia. The decline has been  slow and gradual, the family has not noticed periods of sudden worsening of her function which would be characteristic of vascular dementia.  She has no flat affect, gait disturbances, or cogwheel rigidity which could suggest advanced Parkinson's.  She has not expressed paranoid accusatory thoughts which would be typical of frontotemporal dementia. She denies any symptoms of infection such as fever, burning with urination, cough or abdominal pain. She does not feel depressed.  Mini cog score was zero for three word registration, clock drawing, and 3 word recall.  - referral placed for home health nursing for medication management and education  - our office social work representative will arrange for state provided dementia resources  - start trial of memantine  See Encounters Tab for problem based charting.  Patient discussed with Dr. Oswaldo DoneVincent

## 2017-03-07 NOTE — Patient Instructions (Addendum)
Thank you for coming to the clinic today. It was a pleasure to see you.   For your dementia, please meet with our front desk social worker regarding home health resources Start taking memantine 5 mg daily   FOLLOW-UP INSTRUCTIONS When: 1 month in the acute care clinic or with Dr. Saunders RevelNedrud  For: follow up on your memory  What to bring: all of your medication bottles   Please call our clinic if you have any questions or concerns, we may be able to help and keep you from a long and expensive emergency room wait. Our clinic and after hours phone number is [phone], there is always someone available.

## 2017-03-08 ENCOUNTER — Encounter: Payer: Self-pay | Admitting: Internal Medicine

## 2017-03-08 DIAGNOSIS — F0390 Unspecified dementia without behavioral disturbance: Secondary | ICD-10-CM | POA: Insufficient documentation

## 2017-03-08 NOTE — Assessment & Plan Note (Signed)
[name] presents today with her daughters who are concerned for worsening confusion.  The confusion is been progressively worsening over the past 8 years.  At this point she is able to  toileting and showering on her own but is no longer able to cook, paid bills, grocery shopping, Make phone calls, or organize medications on her own. When the confusion first began she was having visual hallucinations of people outside of her home and car which were disturbing to her.  She was diagnosed with cataracts and when these were removed the visual hallucinations improved.  She still sees crawling bugs at times, these hallucinations are not disturbing to her. The results of this are scanned into the chart.  This is most likely alzheimer's dementia. The decline has been slow and gradual, the family has not noticed periods of sudden worsening of her function which would be characteristic of vascular dementia.  She has no flat affect, gait disturbances, or cogwheel rigidity which could suggest advanced Parkinson's.  She has not expressed paranoid accusatory thoughts which would be typical of frontotemporal dementia. She denies any symptoms of infection such as fever, burning with urination, cough or abdominal pain. She does not feel depressed.  Mini cog score was zero for three word registration, clock drawing, and 3 word recall.  - referral placed for home health nursing for medication management and education  - our office social work representative will arrange for state provided dementia resources  - start trial of memantine

## 2017-03-09 NOTE — Progress Notes (Signed)
Internal Medicine Clinic Attending  Case discussed with Dr. Blum at the time of the visit.  We reviewed the resident's history and exam and pertinent patient test results.  I agree with the assessment, diagnosis, and plan of care documented in the resident's note. 

## 2017-03-27 ENCOUNTER — Encounter: Payer: Self-pay | Admitting: Internal Medicine

## 2017-04-04 ENCOUNTER — Ambulatory Visit (INDEPENDENT_AMBULATORY_CARE_PROVIDER_SITE_OTHER): Payer: Medicare Other | Admitting: Internal Medicine

## 2017-04-04 ENCOUNTER — Other Ambulatory Visit: Payer: Self-pay

## 2017-04-04 ENCOUNTER — Encounter: Payer: Self-pay | Admitting: Internal Medicine

## 2017-04-04 DIAGNOSIS — G308 Other Alzheimer's disease: Secondary | ICD-10-CM

## 2017-04-04 DIAGNOSIS — M1990 Unspecified osteoarthritis, unspecified site: Secondary | ICD-10-CM

## 2017-04-04 DIAGNOSIS — Z79899 Other long term (current) drug therapy: Secondary | ICD-10-CM

## 2017-04-04 DIAGNOSIS — F0280 Dementia in other diseases classified elsewhere without behavioral disturbance: Secondary | ICD-10-CM | POA: Diagnosis not present

## 2017-04-04 DIAGNOSIS — Z86711 Personal history of pulmonary embolism: Secondary | ICD-10-CM | POA: Diagnosis not present

## 2017-04-04 DIAGNOSIS — E785 Hyperlipidemia, unspecified: Secondary | ICD-10-CM

## 2017-04-04 NOTE — Patient Instructions (Addendum)
FOLLOW-UP INSTRUCTIONS When: [facility] For: memory What to bring:  Current medications  Continue with your current dose of Namenda 5mg  daily.  Come back and see us in 3 months.

## 2017-04-04 NOTE — Assessment & Plan Note (Signed)
She is here with her daughter today to follow up on memory issues and dementia.  Was started on Namenda 5mg  daily by Dr. Obie DredgeBlum about 1 month ago.  She has tolerated this medication well and has not noted any side effects at this time.  Her daughter confirms this.  Plan: - Continue Namenda 5mg  daily.  RTC about 3 months for follow up and to meet new PCP. - At that time, consider goals of care discussions.  May also consider up titrating namenda as tolerated.  We did not make any dose adjustments today in her follow up appointment - Daughter reports playing "phone tag" with the home health agency in recent days.  We provided them with a letter from the agency with their contact information.

## 2017-04-04 NOTE — Progress Notes (Signed)
   CC: follow up for memory issues  HPI:  Jennifer Owen is a 84 y.o. woman with a past medical history listed below here today for follow up of her memory issues.  For details of today's visit and the status of her chronic medical issues please refer to the assessment and plan.   Past Medical History:  Diagnosis Date  . GERD (gastroesophageal reflux disease)    Papulous gastropathy EGD Oct. 2007  . Hyperlipemia   . Hypertension   . IBS (irritable bowel syndrome)   . Low back pain    intermittent radiculopathy  . Osteoarthritis   . Osteopenia    T score-1.1 R Fremur, - 0.4 L spine  . Polio 1942  . Pulmonary embolism (HCC)    2/2 knee replacement  . Urinary incontinence    Review of Systems:  Please see pertinent ROS reviewed in HPI and problem based charting.   Physical Exam:  Vitals:   04/04/17 0958  BP: (!) 128/58  Pulse: 62  Temp: 97.9 F (36.6 C)  TempSrc: Oral  SpO2: 100%  Weight: 193 lb 3.2 oz (87.6 kg)  Height: 5\' 6"  (1.676 m)   Physical Exam  Constitutional: She is oriented to person, place, and time and well-developed, well-nourished, and in no distress.  HENT:  Head: Normocephalic and atraumatic.  Eyes: EOM are normal.  Cardiovascular: Normal rate and regular rhythm.  Pulmonary/Chest: Effort normal.  Neurological: She is alert and oriented to person, place, and time.  Skin: Skin is warm and dry.  Psychiatric: Mood and affect normal.     Assessment & Plan:   See Encounters Tab for problem based charting.  Patient discussed with Dr. Rogelia BogaButcher.  Dementia She is here with her daughter today to follow up on memory issues and dementia.  Was started on Namenda 5mg  daily by Dr. Obie DredgeBlum about 1 month ago.  She has tolerated this medication well and has not noted any side effects at this time.  Her daughter confirms this.  Plan: - Continue Namenda 5mg  daily.  RTC about 3 months for follow up and to meet new PCP. - At that time, consider goals of care  discussions.  May also consider up titrating namenda as tolerated.  We did not make any dose adjustments today in her follow up appointment - Daughter reports playing "phone tag" with the home health agency in recent days.  We provided them with a letter from the agency with their contact information.

## 2017-04-04 NOTE — Progress Notes (Signed)
Internal Medicine Clinic Attending  Case discussed with Dr. Earlene PlaterWallace at the time of the visit.  We reviewed the resident's history and exam and pertinent patient test results.  I agree with the assessment, diagnosis, and plan of care documented in the resident's note. Memantine + cholinesterase inhibitor outcomes inconsistent in trials. GOC discussion much needed.

## 2017-04-27 ENCOUNTER — Encounter: Payer: Self-pay | Admitting: Internal Medicine

## 2017-05-08 ENCOUNTER — Other Ambulatory Visit: Payer: Self-pay | Admitting: Internal Medicine

## 2017-05-25 NOTE — Addendum Note (Signed)
Addended by: Neomia Dear on: 05/25/2017 07:23 PM   Modules accepted: Orders

## 2017-06-28 ENCOUNTER — Encounter: Payer: Self-pay | Admitting: *Deleted

## 2017-07-24 ENCOUNTER — Encounter: Payer: Self-pay | Admitting: Internal Medicine

## 2017-07-24 ENCOUNTER — Ambulatory Visit (INDEPENDENT_AMBULATORY_CARE_PROVIDER_SITE_OTHER): Payer: Medicare Other | Admitting: Internal Medicine

## 2017-07-24 VITALS — BP 130/43 | HR 60 | Temp 98.2°F | Wt 195.3 lb

## 2017-07-24 DIAGNOSIS — F0390 Unspecified dementia without behavioral disturbance: Secondary | ICD-10-CM

## 2017-07-24 DIAGNOSIS — Z79899 Other long term (current) drug therapy: Secondary | ICD-10-CM

## 2017-07-24 DIAGNOSIS — G308 Other Alzheimer's disease: Principal | ICD-10-CM

## 2017-07-24 DIAGNOSIS — M159 Polyosteoarthritis, unspecified: Secondary | ICD-10-CM | POA: Diagnosis not present

## 2017-07-24 DIAGNOSIS — I10 Essential (primary) hypertension: Secondary | ICD-10-CM

## 2017-07-24 DIAGNOSIS — M150 Primary generalized (osteo)arthritis: Secondary | ICD-10-CM

## 2017-07-24 DIAGNOSIS — F0280 Dementia in other diseases classified elsewhere without behavioral disturbance: Secondary | ICD-10-CM

## 2017-07-24 MED ORDER — MEMANTINE HCL 5 MG PO TABS: 5.0000 mg | ORAL_TABLET | Freq: Two times a day (BID) | ORAL | Status: DC

## 2017-07-24 MED ORDER — MEMANTINE HCL 5 MG PO TABS: 5.0000 mg | ORAL_TABLET | Freq: Two times a day (BID) | ORAL | 0 refills | Status: DC

## 2017-07-24 NOTE — Progress Notes (Signed)
   CC: follow-up on Dementia    HPI:  Ms.Charmin E Juanetta GoslingHawkins is a 84 y.o. female with PMHx listed below. She is here for a follow-up of her Dementia. She started Namenda 5 mg at her visit back in February of this year and has been tolerating the medication well. Her daughter, Drenda FreezeFran, is with her today and feels like medication is making a difference.   Past Medical History:  Diagnosis Date  . GERD (gastroesophageal reflux disease)    Papulous gastropathy EGD Oct. 2007  . Hyperlipemia   . Hypertension   . IBS (irritable bowel syndrome)   . Low back pain    intermittent radiculopathy  . Osteoarthritis   . Osteopenia    T score-1.1 R Fremur, - 0.4 L spine  . Polio 1942  . Pulmonary embolism (HCC)    2/2 knee replacement  . Urinary incontinence    Review of Systems:  Denies headaches, lightheadedness, abdominal pain, constipation, diarrhea, urinary incontinence, decreased appetite, or increased swelling in her legs.   Physical Exam:  Vitals:   07/24/17 1334  BP: (!) 130/43  Pulse: 60  Temp: 98.2 F (36.8 C)  TempSrc: Oral  SpO2: 100%  Weight: 195 lb 4.8 oz (88.6 kg)   Physical exam General: Extremely pleasant female talking and laughing appropriately. A&Ox3.  Cardiovascular: regular rate and rhythm. No murmurs, gallops or rubs.  Respiratory: no increased work of breathing. Lungs are CTA bilaterally. Abdominal: bowel sounds +. Abdomen is soft, non-tender and non-distended Ext: 1+ pitting edema BLE   Assessment & Plan:   See Encounters Tab for problem based charting.  Patient seen with Dr. Heide SparkNarendra

## 2017-07-24 NOTE — Patient Instructions (Addendum)
It was a pleasure meeting you today!  We have increased your Memantine 5 mg to twice a day to continue to slow down memory loss. We have also ordered a rolling walker for you to use; please call if you have any trouble getting this.  We will call if there are any abnormalities with your lab work.  Otherwise, plan to follow-up in 3 months.

## 2017-07-24 NOTE — Progress Notes (Signed)
Internal Medicine Clinic Attending  I saw and evaluated the patient.  I personally confirmed the key portions of the history and exam documented by Dr. Bloomfield and I reviewed pertinent patient test results.  The assessment, diagnosis, and plan were formulated together and I agree with the documentation in the resident's note.  

## 2017-07-24 NOTE — Assessment & Plan Note (Signed)
Patient and daughter note they have seen a difference since starting Namenda. She is tolerating the medication well. Will titrate up her dose from 5 mg daily to 5 mg bid and follow-up in 3 months.  [name]' daughter feel she is doing well with home health coming 5 days/week. She does not have any further care concerns at this time.  GOC was briefly discussed. Daughter states they have all the legal paperwork signed, and we asked her to bring that in for their next visit.

## 2017-07-24 NOTE — Assessment & Plan Note (Signed)
Patient currently uses cane to ambulate, but feels a rolling walker would be more beneficial to feel more secure whenever she goes out. We ordered this at today's visit.  Denies any increased pain in her joints.

## 2017-07-24 NOTE — Assessment & Plan Note (Signed)
BP Readings from Last 3 Encounters:  07/24/17 (!) 130/43  04/04/17 (!) 128/58  03/07/17 134/79    Lab Results  Component Value Date   NA 144 10/03/2016   K 4.0 10/03/2016   CREATININE 1.06 (H) 10/03/2016    Assessment: Blood pressure control:  well controlled.  Progress toward BP goal: doing well on current regimen   Comments: denies any issues with falls of symptoms of hypotension.   Plan: Medications:  continue current medications Other plans: checking BMET today to monitor renal function and electrolytes.

## 2017-07-25 LAB — BMP8+ANION GAP
ANION GAP: 14 mmol/L (ref 10.0–18.0)
BUN/Creatinine Ratio: 16 (ref 12–28)
BUN: 18 mg/dL (ref 8–27)
CALCIUM: 10.3 mg/dL (ref 8.7–10.3)
CO2: 22 mmol/L (ref 20–29)
CREATININE: 1.12 mg/dL — AB (ref 0.57–1.00)
Chloride: 105 mmol/L (ref 96–106)
GFR calc Af Amer: 52 mL/min/{1.73_m2} — ABNORMAL LOW (ref >59)
GFR, EST NON AFRICAN AMERICAN: 45 mL/min/{1.73_m2} — AB (ref >59)
Glucose: 85 mg/dL (ref 65–99)
POTASSIUM: 4.3 mmol/L (ref 3.5–5.2)
Sodium: 141 mmol/L (ref 134–144)

## 2017-07-31 ENCOUNTER — Encounter: Payer: Self-pay | Admitting: Internal Medicine

## 2017-07-31 NOTE — Progress Notes (Signed)
Electrolytes normal. Renal function stable.

## 2017-08-21 ENCOUNTER — Other Ambulatory Visit: Payer: Self-pay | Admitting: Internal Medicine

## 2017-08-28 NOTE — Telephone Encounter (Signed)
pls sch PCP F/U for dementia around Oct,  Pt got 6 months lisinopril about 1 yr ago. ? Compliance and need?

## 2017-10-26 ENCOUNTER — Encounter: Payer: Self-pay | Admitting: Internal Medicine

## 2017-11-16 ENCOUNTER — Other Ambulatory Visit: Payer: Self-pay | Admitting: Internal Medicine

## 2017-11-16 DIAGNOSIS — I10 Essential (primary) hypertension: Secondary | ICD-10-CM

## 2017-11-16 NOTE — Telephone Encounter (Signed)
Duplicate request-medication requested in another encounter, pending MD review.  Will close this request.Zian Mohamed [name]11/7/20194:06 PM

## 2017-11-22 ENCOUNTER — Ambulatory Visit (INDEPENDENT_AMBULATORY_CARE_PROVIDER_SITE_OTHER): Payer: Medicare Other | Admitting: Podiatry

## 2017-11-22 ENCOUNTER — Encounter: Payer: Self-pay | Admitting: Podiatry

## 2017-11-22 DIAGNOSIS — M79675 Pain in left toe(s): Secondary | ICD-10-CM | POA: Diagnosis not present

## 2017-11-22 DIAGNOSIS — B351 Tinea unguium: Secondary | ICD-10-CM

## 2017-11-22 DIAGNOSIS — M79674 Pain in right toe(s): Secondary | ICD-10-CM

## 2017-11-22 NOTE — Progress Notes (Signed)
Complaint:  Visit Type: Patient returns to my office for continued preventative foot care services. Complaint: Patient states" my nails have grown long and thick and become painful to walk and wear shoes" Patient has been diagnosed with DM with no foot complications. The patient presents for preventative foot care services. No changes to ROS.  Patient has not been seen in one year.  Podiatric Exam: Vascular: dorsalis pedis and posterior tibial pulses are palpable bilateral. Capillary return is immediate. Temperature gradient is WNL. Skin turgor WNL  Sensorium: Normal Semmes Weinstein monofilament test. Normal tactile sensation bilaterally. Nail Exam: Pt has thick disfigured discolored nails with subungual debris noted bilateral entire nail hallux through fifth toenails Ulcer Exam: There is no evidence of ulcer or pre-ulcerative changes or infection. Orthopedic Exam: Muscle tone and strength are WNL. No limitations in general ROM. No crepitus or effusions noted. Foot type and digits show no abnormalities. Bony prominences are unremarkable. Skin: No Porokeratosis. No infection or ulcers  Diagnosis:  Onychomycosis, , Pain in right toe, pain in left toes  Treatment & Plan Procedures and Treatment: Consent by patient was obtained for treatment procedures.   Debridement of mycotic and hypertrophic toenails, 1 through 5 bilateral and clearing of subungual debris. No ulceration, no infection noted.  Return Visit-Office Procedure: Patient instructed to return to the office for a follow up visit 3 months for continued evaluation and treatment.    Helane GuntherGregory Joson Sapp DPM

## 2018-01-16 ENCOUNTER — Ambulatory Visit (INDEPENDENT_AMBULATORY_CARE_PROVIDER_SITE_OTHER): Payer: Medicare Other | Admitting: Internal Medicine

## 2018-01-16 ENCOUNTER — Encounter: Payer: Self-pay | Admitting: Internal Medicine

## 2018-01-16 ENCOUNTER — Other Ambulatory Visit: Payer: Self-pay

## 2018-01-16 DIAGNOSIS — R05 Cough: Secondary | ICD-10-CM

## 2018-01-16 DIAGNOSIS — R059 Cough, unspecified: Secondary | ICD-10-CM

## 2018-01-16 DIAGNOSIS — R053 Chronic cough: Secondary | ICD-10-CM | POA: Insufficient documentation

## 2018-01-16 MED ORDER — GUAIFENESIN ER 600 MG PO TB12: 600.0000 mg | ORAL_TABLET | Freq: Two times a day (BID) | ORAL | 0 refills | Status: DC | PRN

## 2018-01-16 MED ORDER — BENZONATATE 100 MG PO CAPS: 100.0000 mg | ORAL_CAPSULE | Freq: Three times a day (TID) | ORAL | 0 refills | Status: DC

## 2018-01-16 NOTE — Progress Notes (Signed)
   CC: Cough  HPI:  Jennifer Owen is a 85 y.o. female with past medical history listed below, presented to the clinic today due to cough for past 2 weeks. She had some runny nose initially that resolved. The cough is sometimes come with clear mucus. She other wise feel great and goes to church every day. No fever or chills. No SOB,  No body ache. Per her daughter, she got OTC cough medications: Coricidin and Delsym. Those did not help a lot and she came to the clinic for further management.  Please see problem based charting for further details and assessment and plan.  Past Medical History:  Diagnosis Date  . GERD (gastroesophageal reflux disease)    Papulous gastropathy EGD Oct. 2007  . Hyperlipemia   . Hypertension   . IBS (irritable bowel syndrome)   . Low back pain    intermittent radiculopathy  . Osteoarthritis   . Osteopenia    T score-1.1 R Fremur, - 0.4 L spine  . Polio 1942  . Pulmonary embolism (HCC)    2/2 knee replacement  . Urinary incontinence    Review of Systems: Review of Systems  Constitutional: Negative for chills and fever.  Respiratory: Positive for cough.   Cardiovascular: Negative for chest pain and palpitations.  Gastrointestinal: Negative for abdominal pain, diarrhea, nausea and vomiting.  Genitourinary: Negative for dysuria, frequency and urgency.  Musculoskeletal: Negative for myalgias.  Skin: Negative for rash.  Neurological: Negative for dizziness and headaches.  Psychiatric/Behavioral: Negative for depression.    Physical Exam:  Vitals:   01/16/18 1422  BP: (!) 137/48  Pulse: 74  Temp: 97.8 F (36.6 C)  TempSrc: Oral  SpO2: 100%  Weight: 193 lb 1.6 oz (87.6 kg)  Height: 5\' 6"  (1.676 m)   Physical Exam Vitals signs reviewed.  Constitutional:      Appearance: Normal appearance.  HENT:     Head: Normocephalic and atraumatic.     Comments: No tenderness on maxillary or frontal sinuses. Eyes:     Extraocular Movements:  Extraocular movements intact.  Cardiovascular:     Rate and Rhythm: Normal rate and regular rhythm.     Heart sounds: No murmur.  Pulmonary:     Effort: Pulmonary effort is normal. No respiratory distress.     Breath sounds: Normal breath sounds. No wheezing or rales.  Abdominal:     General: Bowel sounds are normal.     Palpations: Abdomen is soft.     Tenderness: There is no abdominal tenderness.  Neurological:     Mental Status: She is alert. Mental status is at baseline.  Psychiatric:        Mood and Affect: Mood normal.        Behavior: Behavior normal.     Assessment & Plan:   See Encounters Tab for problem based charting.  Patient discussed with Dr. Rogelia Boga

## 2018-01-16 NOTE — Patient Instructions (Addendum)
Thank you for allowing [place] to provide your care today. You came in for cough. It does not seem to be pneumonia. I prescribe you some cough medications. Please take them as instructed. I also recommend you to continue Nexium with empty stomach. If your symptoms did not get better, you can   Please follow-up in 6 month with your PCP for routine follow up or sooner if your cough did not improve.  Should you have any questions or concerns please call the internal medicine clinic at [phone].    Thanks

## 2018-01-17 NOTE — Progress Notes (Signed)
Internal Medicine Clinic Attending  Case discussed with Dr. Masoudi  at the time of the visit.  We reviewed the resident's history and exam and pertinent patient test results.  I agree with the assessment, diagnosis, and plan of care documented in the resident's note.  

## 2018-01-17 NOTE — Assessment & Plan Note (Signed)
Patient presented with 2 weeks history of cough with clear/yellow mucus. Initially associated with runny nose but it resolved then. No fever, chills. No SOB. No body ache or fatigue. No GI symptoms. Her cough is not associated with eating or drinking. No sick contact around her.  Patient presented with acute cough with clear discharge. Is otherwise doing well. Lung exam is clear. Does not seem to be upper respiratory infection or pneumonia. Likley upper airway cough syndrome. -Benzonatate 100 mg TID for 5 days -Mucinex 600 mg BID PRN for 5 days -Return in clinic in 2 weeks if no improvement to be evaluated for chronic cough ethiology. (Is on Lisinopril, how ever it has not been a new prescription and her cough is not dry to be considered due to ACE inh. Also no association with food and is already on Nexium. Less likely to be due to GERD)

## 2018-02-16 ENCOUNTER — Other Ambulatory Visit: Payer: Self-pay | Admitting: Internal Medicine

## 2018-02-21 ENCOUNTER — Ambulatory Visit: Payer: Medicare Other | Admitting: Podiatry

## 2018-03-19 ENCOUNTER — Encounter: Payer: Self-pay | Admitting: Internal Medicine

## 2018-03-19 ENCOUNTER — Other Ambulatory Visit: Payer: Self-pay

## 2018-03-19 ENCOUNTER — Ambulatory Visit (INDEPENDENT_AMBULATORY_CARE_PROVIDER_SITE_OTHER): Payer: Medicare Other | Admitting: Internal Medicine

## 2018-03-19 DIAGNOSIS — R059 Cough, unspecified: Secondary | ICD-10-CM

## 2018-03-19 DIAGNOSIS — R05 Cough: Secondary | ICD-10-CM

## 2018-03-19 MED ORDER — DM-GUAIFENESIN ER 30-600 MG PO TB12: 1.0000 | ORAL_TABLET | Freq: Two times a day (BID) | ORAL | 1 refills | Status: DC

## 2018-03-19 MED ORDER — FLUTICASONE PROPIONATE 50 MCG/ACT NA SUSP: 2.0000 | Freq: Every day | NASAL | 2 refills | Status: DC

## 2018-03-19 MED ORDER — LORATADINE 10 MG PO TABS: 10.0000 mg | ORAL_TABLET | Freq: Every day | ORAL | 2 refills | Status: DC

## 2018-03-19 NOTE — Patient Instructions (Signed)
Jennifer Owen, It was a pleasure seeing you! You likely have a post viral cough that takes time to go away.  I am prescribing you flonase and Claritin which should help dry up your airways. I am also changing the mucinex to include a cough suppressant which should help.   Hope you continue to feel better! Dr. Chesley Mires

## 2018-03-21 ENCOUNTER — Encounter: Payer: Self-pay | Admitting: Internal Medicine

## 2018-03-21 NOTE — Progress Notes (Signed)
   CC: cough   HPI:  [name] is a 85 y.o. female with PMHx listed below who presents for persistent cough. She was diagnosed with a viral URI on 1/7 and has had persistent cough since then. She states she feels better overall, but continues to have intermittently productive cough with clear/white sputum. Also endorses mild nasal congestion. Denies fevers, chills, headaches, sinus tenderness, sore throat, hemoptysis, chest pain, shortness of breath, abdominal pain, n/v, changes in bowel movements, or urinary symptoms.   Past Medical History:  Diagnosis Date  . GERD (gastroesophageal reflux disease)    Papulous gastropathy EGD Oct. 2007  . Hyperlipemia   . Hypertension   . IBS (irritable bowel syndrome)   . Low back pain    intermittent radiculopathy  . Osteoarthritis   . Osteopenia    T score-1.1 R Fremur, - 0.4 L spine  . Polio 1942  . Pulmonary embolism (HCC)    2/2 knee replacement  . Urinary incontinence    Review of Systems:  Refer to history of present illness and assessment and plans for pertinent review of systems, all others reviewed and negative.   Physical Exam:  Vitals:   03/19/18 1358  BP: 107/63  Pulse: 65  Temp: 97.9 F (36.6 C)  SpO2: 100%  Weight: 194 lb 1.6 oz (88 kg)   Physical Exam Constitutional:      General: She is not in acute distress.    Appearance: Normal appearance. She is not ill-appearing.  HENT:     Head: Normocephalic and atraumatic.     Right Ear: Tympanic membrane normal.     Left Ear: Tympanic membrane normal.     Mouth/Throat:     Mouth: Mucous membranes are moist.     Pharynx: No oropharyngeal exudate or posterior oropharyngeal erythema.  Eyes:     Conjunctiva/sclera: Conjunctivae normal.  Cardiovascular:     Rate and Rhythm: Normal rate and regular rhythm.  Pulmonary:     Effort: Pulmonary effort is normal.     Breath sounds: Normal breath sounds.  Abdominal:     Palpations: Abdomen is soft.     Tenderness:  There is no abdominal tenderness.  Musculoskeletal:     Comments: Chronic BLE edema   Lymphadenopathy:     Cervical: No cervical adenopathy.  Neurological:     Mental Status: She is alert. Mental status is at baseline.      Assessment & Plan:   See Encounters Tab for problem based charting.  Patient discussed with Dr. Sandre Kitty

## 2018-03-21 NOTE — Assessment & Plan Note (Signed)
Patient likely has post-viral cough. No shortness of breath, fever, or findings on exam concerning for pneumonia. Symptoms are not associated with eating. She has been taking Mucinex with some relief, but did not notice much difference with Tessalon Perles.  Will add dextromethorphan to Mucinex. Also given prescription for loratadine and Flonase to improve nasal congestion which should also help decrease cough.  Instructed to return in 4 weeks if symptoms are not any better. If cough persists, will pursue CXR and work-up for chronic cough etiologies.

## 2018-03-22 NOTE — Progress Notes (Signed)
Internal Medicine Clinic Attending  Case discussed with Dr. Bloomfield at the time of the visit.  We reviewed the resident's history and exam and pertinent patient test results.  I agree with the assessment, diagnosis, and plan of care documented in the resident's note.  Alexander Raines, M.D., Ph.D.  

## 2018-04-16 ENCOUNTER — Telehealth: Payer: Self-pay

## 2018-04-16 NOTE — Telephone Encounter (Signed)
Pt's daughter called states mother is still coughing. Please call back.

## 2018-04-16 NOTE — Telephone Encounter (Signed)
Pt's daughter calls and states that pt continues to have a cough, only change is that it is "very mucousy" very wet, productive- clear in color but "a whole lot" denies fever, diarrhea, h/a, weakness, aches/ pains. She states she feels good except the cough is bothersome. Please advise

## 2018-04-17 NOTE — Telephone Encounter (Signed)
Tried to return call to patient's daughter. No voicemail set-up. Will try back again later. If she calls back, please ensure she is taking the Mucinex-DM, Flonase, and an allergy medication.

## 2018-04-18 ENCOUNTER — Telehealth: Payer: Self-pay

## 2018-04-18 ENCOUNTER — Other Ambulatory Visit: Payer: Self-pay

## 2018-04-18 ENCOUNTER — Ambulatory Visit (INDEPENDENT_AMBULATORY_CARE_PROVIDER_SITE_OTHER): Payer: Medicare Other | Admitting: Internal Medicine

## 2018-04-18 DIAGNOSIS — R053 Chronic cough: Secondary | ICD-10-CM

## 2018-04-18 DIAGNOSIS — R05 Cough: Secondary | ICD-10-CM | POA: Diagnosis not present

## 2018-04-18 MED ORDER — BENZONATATE 100 MG PO CAPS: 100.0000 mg | ORAL_CAPSULE | Freq: Three times a day (TID) | ORAL | 0 refills | Status: DC | PRN

## 2018-04-18 MED ORDER — ESOMEPRAZOLE MAGNESIUM 40 MG PO CPDR: 40.0000 mg | DELAYED_RELEASE_CAPSULE | Freq: Every day | ORAL | 0 refills | Status: DC

## 2018-04-18 MED ORDER — ALBUTEROL SULFATE HFA 108 (90 BASE) MCG/ACT IN AERS: 1.0000 | INHALATION_SPRAY | Freq: Four times a day (QID) | RESPIRATORY_TRACT | 0 refills | Status: DC | PRN

## 2018-04-18 NOTE — Assessment & Plan Note (Signed)
   A: Persistent cough: productive cough of mostly clear sputum or yellow, never smoker per daughter although listed in chart as former smoker.    No fevers or chills, no SOB, doesn't usually have seasonal allergies.  Had a bad cold about 2 weeks ago.  She feels well, working in the garden.  Taking mucinex DM and Flonase  for this cough.  Not having much of a runny nose.  Sounds like something is in her chest, throat does not feel irritated or itchy. Not currently taking nexium hasn't had a lot of heartburn since switching to a healthier diet.  P: d/c lisinopril, restart nexium, prescribe albuterol, tessalon perles, ordered chest x-ray pt will go to [facility] will review chest x-ray and determine further management.  As always, pt is advised that if symptoms worsen or new symptoms arise, they should go to an urgent care facility or to to ER for further evaluation.

## 2018-04-18 NOTE — Progress Notes (Signed)
   [facility] Internal Medicine Residency Telephone Encounter  Reason for call:   This telephone encounter was created for [name] on 04/18/2018 for the following purpose/cc cough.   Pertinent Data:   Persistent cough: productive cough of clear sputum, never smoker, up to date on pneumovax.   ROS: Pulmonary: pt denies increased work of breathing, shortness of breath,  Cardiac: pt denies palpitations, chest pain,   Abdominal: pt denies abdominal pain, nausea, vomiting, or diarrhea   Assessment / Plan / Recommendations:   A: Persistent cough: productive cough of mostly clear sputum or yellow, never smoker per daughter although listed in chart as former smoker.    No fevers or chills, no SOB, doesn't usually have seasonal allergies.  Had a bad cold about 2 weeks ago.  She feels well, working in the garden.  Taking mucinex DM and Flonase  for this cough.  Not having much of a runny nose.  Sounds like something is in her chest, throat does not feel irritated or itchy. Not currently taking nexium hasn't had a lot of heartburn since switching to a healthier diet.  P: d/c lisinopril, restart nexium, prescribe albuterol, tessalon perles, ordered chest x-ray pt will go to [facility] will review chest x-ray and determine further management.  As always, pt is advised that if symptoms worsen or new symptoms arise, they should go to an urgent care facility or to to ER for further evaluation.   Consent and Medical Decision Making:   Patient discussed with Dr. Rogelia Boga  This is a telephone encounter between Jennifer Owen and Thornell Mule on 04/18/2018 for persistent cough. The visit was conducted with the patient located at home and Thornell Mule at [facility]. The patient's identity was confirmed using their DOB and current address. The his/her legal guardian has consented to being evaluated through a telephone encounter and understands the associated risks (an examination cannot be  done and the patient may need to come in for an appointment) / benefits (allows the patient to remain at home, decreasing exposure to coronavirus). I personally spent 16 minutes on medical discussion.

## 2018-04-18 NOTE — Telephone Encounter (Addendum)
Returned call to daughter. States she was told to call back in 4 weeks if patient's cough has not gone away. States patient continues to cough but instead of a "dry hacking cough" it is now a "wet mucousy cough." Patient has not traveled nor been around anyone who has. No known sick contacts.  NURSING TRIAGE NOTE FOR RESPIRATORY SYMPTOMS  Do you have a fever? No  Do you have a cough? Yes - productive Do you have shortness of breath more than normal? No Do you have chest pain?No Are you able to eat and drink normally? Yes Have you seen a physician for these symptoms? Yes - 03/19/2018  Action information sent to physician to conduct a phone appoitment  I informed patient to expect a phone call from a physician soon and I sent request to front desk to schedule a virtual office appt for patient and arrive the patient.

## 2018-04-18 NOTE — Telephone Encounter (Signed)
Pt's daughter requesting to speak with a nurse. Please call back.  

## 2018-04-19 NOTE — Progress Notes (Signed)
[facility] Attending  Case discussed with Dr. Winfrey  at the time of the visit.  We reviewed the resident's history and exam and pertinent patient test results.  I agree with the assessment, diagnosis, and plan of care documented in the resident's note.  

## 2018-04-20 ENCOUNTER — Ambulatory Visit
Admission: RE | Admit: 2018-04-20 | Discharge: 2018-04-20 | Disposition: A | Discharge status: Home / Self Care | Payer: Medicare Other | Source: Ambulatory Visit | Attending: Internal Medicine | Admitting: Internal Medicine

## 2018-04-20 ENCOUNTER — Other Ambulatory Visit: Payer: Self-pay

## 2018-04-20 DIAGNOSIS — R05 Cough: Secondary | ICD-10-CM

## 2018-04-20 DIAGNOSIS — R053 Chronic cough: Secondary | ICD-10-CM

## 2018-04-24 ENCOUNTER — Telehealth: Payer: Self-pay | Admitting: Internal Medicine

## 2018-04-24 NOTE — Telephone Encounter (Signed)
left message with results of normal chest x-ray

## 2018-04-30 ENCOUNTER — Other Ambulatory Visit: Payer: Self-pay | Admitting: Internal Medicine

## 2018-04-30 DIAGNOSIS — R053 Chronic cough: Secondary | ICD-10-CM

## 2018-04-30 DIAGNOSIS — R05 Cough: Secondary | ICD-10-CM

## 2018-05-18 ENCOUNTER — Other Ambulatory Visit: Payer: Self-pay | Admitting: Oncology

## 2018-05-18 DIAGNOSIS — R05 Cough: Secondary | ICD-10-CM

## 2018-05-18 DIAGNOSIS — R053 Chronic cough: Secondary | ICD-10-CM

## 2018-05-18 NOTE — Telephone Encounter (Signed)
Awaiting call back regarding symptoms based on Dr. Timoteo Expose note.

## 2018-05-18 NOTE — Telephone Encounter (Signed)
Please call pt to see if she still has symptoms before I refill meds. Thanks drG

## 2018-05-21 NOTE — Telephone Encounter (Signed)
Will defer to PCP as she is here today

## 2018-05-22 NOTE — Telephone Encounter (Signed)
I'll send in refills, but can we also schedule her for a telehealth sometime this week to follow-up? Reviewed last visit in April and had a normal CXR at that time.  Thanks!

## 2018-05-22 NOTE — Telephone Encounter (Signed)
Spoke w/ pt's daughter, she states that pt's cough is lingering and she needs "those medicines refilled" she states pt is coughing up white frothy sputum. Denies fevers. No other complaints Please advise, refill or tele or face to face? Sending to pcp, attending

## 2018-05-22 NOTE — Telephone Encounter (Addendum)
Request for televisit sent to front office for scheduling.Criss Alvine, Darlene [name]5/12/20201:01 PM

## 2018-05-29 ENCOUNTER — Encounter: Payer: Self-pay | Admitting: Internal Medicine

## 2018-05-29 ENCOUNTER — Ambulatory Visit (INDEPENDENT_AMBULATORY_CARE_PROVIDER_SITE_OTHER): Payer: Medicare Other | Admitting: Internal Medicine

## 2018-05-29 ENCOUNTER — Other Ambulatory Visit: Payer: Self-pay

## 2018-05-29 DIAGNOSIS — R053 Chronic cough: Secondary | ICD-10-CM

## 2018-05-29 DIAGNOSIS — R05 Cough: Secondary | ICD-10-CM

## 2018-05-29 NOTE — Progress Notes (Signed)
   CC: follow up of cough   This is a telephone encounter between Jennifer Owen and Jennifer Owen on 05/29/2018 for follow up of cough. The visit was conducted with the patient located at home and Jennifer Owen at [facility]. The patient's identity was confirmed using their DOB and current address. The patient has consented to being evaluated through a telephone encounter and understands the associated risks (an examination cannot be done and the patient may need to come in for an appointment) / benefits (allows the patient to remain at home, decreasing exposure to coronavirus). I personally spent 18 minutes on medical discussion.   HPI:  [name] is a 85 y.o. with PMH as below.   Please see A&P for assessment of the patient's acute and chronic medical conditions.   Past Medical History:  Diagnosis Date  . GERD (gastroesophageal reflux disease)    Papulous gastropathy EGD Oct. 2007  . Hyperlipemia   . Hypertension   . IBS (irritable bowel syndrome)   . Low back pain    intermittent radiculopathy  . Osteoarthritis   . Osteopenia    T score-1.1 R F[place]ur, - 0.4 L spine  . Polio 1942  . Pulmonary embolism (HCC)    2/2 knee replacement  . Urinary incontinence    Review of Systems:  Refer to history of present illness and assessment and plans for pertinent review of systems, all others reviewed and negative  Assessment & Plan:   Chronic cough  Patient has persistent productive cough with mucus production which has been ongoing for the past three months. She denies chest pain, shortness of breath, fever or soar throat. She has stopped taking lisinopril. She has restarted nexium. She also started taking an albuterol inhaler twice per day. Before making the medication change the cough was persistent. Now it is intermittent, and "a lot better". She has no history of seasonal allergies. Cough is not worsened by exertion or worse at night. Chest xray unremarkable.   A: Chronic cough which has  improved with discontinuation of lisinopril, restarting of GERD medications and starting scheduled albuterol. I feel this cough less likely represents cough variant asthma. Will try discontinuint albuterol, continuing other medications changes. Follow up for blood pressure check and reevaluation of the cough in two weeks.   - continue to hold Lisinopril, follow up in person for blood pressure check.  - discontinue albuterol - continue nexium    See Encounters Tab for problem based charting.  Patient discussed with Dr. Cleda Daub

## 2018-05-29 NOTE — Assessment & Plan Note (Signed)
Patient has persistent productive cough with mucus production which has been ongoing for the past three months. She denies chest pain, shortness of breath, fever or soar throat. She has stopped taking lisinopril. She has restarted nexium. She also started taking an albuterol inhaler twice per day. Before making the medication change the cough was persistent. Now it is intermittent, and "a lot better". She has no history of seasonal allergies. Cough is not worsened by exertion or worse at night. Chest xray unremarkable.   A: Chronic cough which has improved with discontinuation of lisinopril, restarting of GERD medications and starting scheduled albuterol. I feel this cough less likely represents cough variant asthma. Will try discontinuint albuterol, continuing other medications changes. Follow up for blood pressure check and reevaluation of the cough in two weeks.   - continue to hold Lisinopril, follow up in person for blood pressure check.  - discontinue albuterol - continue nexium

## 2018-05-29 NOTE — Progress Notes (Signed)
Internal Medicine Clinic Attending  Case discussed with Dr. Blum at the time of the visit.  We reviewed the resident's history and exam and pertinent patient test results.  I agree with the assessment, diagnosis, and plan of care documented in the resident's note. 

## 2018-06-11 ENCOUNTER — Encounter: Payer: Medicare Other | Admitting: Internal Medicine

## 2018-06-12 ENCOUNTER — Encounter: Payer: Self-pay | Admitting: Podiatry

## 2018-06-12 ENCOUNTER — Other Ambulatory Visit: Payer: Self-pay

## 2018-06-12 ENCOUNTER — Ambulatory Visit (INDEPENDENT_AMBULATORY_CARE_PROVIDER_SITE_OTHER): Payer: Medicare Other | Admitting: Podiatry

## 2018-06-12 VITALS — Temp 98.2°F

## 2018-06-12 DIAGNOSIS — M79674 Pain in right toe(s): Secondary | ICD-10-CM | POA: Diagnosis not present

## 2018-06-12 DIAGNOSIS — B351 Tinea unguium: Secondary | ICD-10-CM | POA: Diagnosis not present

## 2018-06-12 DIAGNOSIS — M79675 Pain in left toe(s): Secondary | ICD-10-CM | POA: Diagnosis not present

## 2018-06-12 DIAGNOSIS — G609 Hereditary and idiopathic neuropathy, unspecified: Secondary | ICD-10-CM

## 2018-06-12 NOTE — Progress Notes (Signed)
Complaint:  Visit Type: Patient returns to my office for continued preventative foot care services. Complaint: Patient states" my nails have grown long and thick and become painful to walk and wear shoes" Patient has been diagnosed with DM with no foot complications. The patient presents for preventative foot care services. No changes to ROS.  Patient has not been seen in over six months.  Podiatric Exam: Vascular: dorsalis pedis and posterior tibial pulses are palpable bilateral. Capillary return is immediate. Temperature gradient is WNL. Skin turgor WNL  Sensorium: Normal Semmes Weinstein monofilament test. Normal tactile sensation bilaterally. Nail Exam: Pt has thick disfigured discolored nails with subungual debris noted bilateral entire nail hallux through fifth toenails Ulcer Exam: There is no evidence of ulcer or pre-ulcerative changes or infection. Orthopedic Exam: Muscle tone and strength are WNL. No limitations in general ROM. No crepitus or effusions noted. Foot type and digits show no abnormalities. Bony prominences are unremarkable. Skin: No Porokeratosis. No infection or ulcers  Diagnosis:  Onychomycosis, , Pain in right toe, pain in left toes  Treatment & Plan Procedures and Treatment: Consent by patient was obtained for treatment procedures.   Debridement of mycotic and hypertrophic toenails, 1 through 5 bilateral and clearing of subungual debris. No ulceration, no infection noted.  Return Visit-Office Procedure: Patient instructed to return to the office for a follow up visit 3 months for continued evaluation and treatment.    Helane Gunther DPM

## 2018-06-13 ENCOUNTER — Ambulatory Visit: Payer: Medicare Other | Admitting: Podiatry

## 2018-06-20 ENCOUNTER — Other Ambulatory Visit: Payer: Self-pay | Admitting: Internal Medicine

## 2018-07-02 ENCOUNTER — Ambulatory Visit (INDEPENDENT_AMBULATORY_CARE_PROVIDER_SITE_OTHER): Payer: Medicare Other | Admitting: Internal Medicine

## 2018-07-02 ENCOUNTER — Other Ambulatory Visit: Payer: Self-pay

## 2018-07-02 ENCOUNTER — Encounter: Payer: Self-pay | Admitting: Internal Medicine

## 2018-07-02 DIAGNOSIS — Z0000 Encounter for general adult medical examination without abnormal findings: Secondary | ICD-10-CM

## 2018-07-02 NOTE — Patient Instructions (Addendum)
Annual Wellness Visit   Medicare Covered Preventative Screenings and Services  Services & Screenings Men and Women Who How Often Need? Date of Last Service Action  Abdominal Aortic Aneurysm Adults with AAA risk factors Once     Alcohol Misuse and Counseling All Adults Screening once a year if no alcohol misuse. Counseling up to 4 face to face sessions.     Bone Density Measurement  Adults at risk for osteoporosis Once every 2 yrs     Lipid Panel Z13.6 All adults without CV disease Once every 5 yrs     Colorectal Cancer   Stool sample or  Colonoscopy All adults 40 and older   Once every year  Every 10 years     Depression All Adults Once a year Yes Today  PHQ-9 = 0  Diabetes Screening Blood glucose, post glucose load, or GTT Z13.1  All adults at risk  Pre-diabetics  Once per year  Twice per year     Diabetes  Self-Management Training All adults Diabetics 10 hrs first year; 2 hours subsequent years. Requires Copay     Glaucoma  Diabetics  Family history of glaucoma  African Americans 4 yrs +  Hispanic Americans 16 yrs + Annually - requires coppay     Hepatitis C Z72.89 or F19.20  High Risk for HCV  Born between 1945 and 1965  Annually  Once     HIV Z11.4 All adults based on risk  Annually btw ages 46 & 27 regardless of risk  Annually > 65 yrs if at increased risk     Lung Cancer Screening Asymptomatic adults aged 30-77 with 30 pack yr history and current smoker OR quit within the last 15 yrs Annually Must have counseling and shared decision making documentation before first screen     Medical Nutrition Therapy Adults with   Diabetes  Renal disease  Kidney transplant within past 3 yrs 3 hours first year; 2 hours subsequent years     Obesity and Counseling All adults Screening once a year Counseling if BMI 30 or higher Yes    Tobacco Use Counseling Adults who use tobacco  Up to 8 visits in one year     Vaccines Z23  Hepatitis B  Influenza   Pneumonia   Adults   Once  Once every flu season  Two different vaccines separated by one year  Yes    Annual Flu Vaccine starting Sept 1  Next Annual Wellness Visit People with Medicare Every year  Today     Services & Screenings Women Who How Often Need  Date of Last Service Action  Mammogram  Z12.31 Women over 7 One baseline ages 87-39. Annually ager 40 yrs+     Pap tests All women Annually if high risk. Every 2 yrs for normal risk women     Screening for cervical cancer with   Pap (Z01.419 nl or Z01.411abnl) &  HPV Z11.51 Women aged 58 to 81 Once every 5 yrs     Screening pelvic and breast exams All women Annually if high risk. Every 2 yrs for normal risk women     Sexually Transmitted Diseases  Chlamydia  Gonorrhea  Syphilis All at risk adults Annually for non pregnant females at increased risk         [place] Men Who How Ofter Need  Date of Last Service Action  Prostate Cancer - DRE & PSA Men over 50 Annually.  DRE might require a copay.  Sexually Transmitted Diseases  Syphilis All at risk adults Annually for men at increased risk         Things That May Be Affecting Your Health:  Alcohol  Hearing loss  Pain    Depression X Home Safety  Sexual Health   Diabetes  Lack of physical activity  Stress  X Difficulty with daily activities  Loneliness  Tiredness   Drug use  Medicines  Tobacco use   Falls  Motor Vehicle Safety  Weight   Food choices  Oral Health  Other    YOUR PERSONALIZED HEALTH PLAN : 1. Schedule your next subsequent Medicare Wellness visit in one year 2. Attend all of your regular appointments to address your medical issues 3. Complete the preventative screenings and services 4. Get Flu Vaccine starting Sept 1, 2020 5. Begin doing church exercises at home 3 days per week     Fall Prevention in the Home, Adult Falls can cause injuries. They can happen to people of all ages. There are many things you can do to make your home safe  and to help prevent falls. Ask for help when making these changes, if needed. What actions can I take to prevent falls? General Instructions  Use good lighting in all rooms. Replace any light bulbs that burn out.  Turn on the lights when you go into a dark area. Use night-lights.  Keep items that you use often in easy-to-reach places. Lower the shelves around your home if necessary.  Set up your furniture so you have a clear path. Avoid moving your furniture around.  Do not have throw rugs and other things on the floor that can make you trip.  Avoid walking on wet floors.  If any of your floors are uneven, fix them.  Add color or contrast paint or tape to clearly mark and help you see: ? Any grab bars or handrails. ? First and last steps of stairways. ? Where the edge of each step is.  If you use a stepladder: ? Make sure that it is fully opened. Do not climb a closed stepladder. ? Make sure that both sides of the stepladder are locked into place. ? Ask someone to hold the stepladder for you while you use it.  If there are any pets around you, be aware of where they are. What can I do in the bathroom?      Keep the floor dry. Clean up any water that spills onto the floor as soon as it happens.  Remove soap buildup in the tub or shower regularly.  Use non-skid mats or decals on the floor of the tub or shower.  Attach bath mats securely with double-sided, non-slip rug tape.  If you need to sit down in the shower, use a plastic, non-slip stool.  Install grab bars by the toilet and in the tub and shower. Do not use towel bars as grab bars. What can I do in the bedroom?  Make sure that you have a light by your bed that is easy to reach.  Do not use any sheets or blankets that are too big for your bed. They should not hang down onto the floor.  Have a firm chair that has side arms. You can use this for support while you get dressed. What can I do in the kitchen?  Clean  up any spills right away.  If you need to reach something above you, use a strong step stool that has a grab bar.  Keep electrical cords out of the way.  Do not use floor polish or wax that makes floors slippery. If you must use wax, use non-skid floor wax. What can I do with my stairs?  Do not leave any items on the stairs.  Make sure that you have a light switch at the top of the stairs and the bottom of the stairs. If you do not have them, ask someone to add them for you.  Make sure that there are handrails on both sides of the stairs, and use them. Fix handrails that are broken or loose. Make sure that handrails are as long as the stairways.  Install non-slip stair treads on all stairs in your home.  Avoid having throw rugs at the top or bottom of the stairs. If you do have throw rugs, attach them to the floor with carpet tape.  Choose a carpet that does not hide the edge of the steps on the stairway.  Check any carpeting to make sure that it is firmly attached to the stairs. Fix any carpet that is loose or worn. What can I do on the outside of my home?  Use bright outdoor lighting.  Regularly fix the edges of walkways and driveways and fix any cracks.  Remove anything that might make you trip as you walk through a door, such as a raised step or threshold.  Trim any bushes or trees on the path to your home.  Regularly check to see if handrails are loose or broken. Make sure that both sides of any steps have handrails.  Install guardrails along the edges of any raised decks and porches.  Clear walking paths of anything that might make someone trip, such as tools or rocks.  Have any leaves, snow, or ice cleared regularly.  Use sand or salt on walking paths during winter.  Clean up any spills in your garage right away. This includes grease or oil spills. What other actions can I take?  Wear shoes that: ? Have a low heel. Do not wear high heels. ? Have rubber  bottoms. ? Are comfortable and fit you well. ? Are closed at the toe. Do not wear open-toe sandals.  Use tools that help you move around (mobility aids) if they are needed. These include: ? Canes. ? Walkers. ? Scooters. ? Crutches.  Review your medicines with your doctor. Some medicines can make you feel dizzy. This can increase your chance of falling. Ask your doctor what other things you can do to help prevent falls. Where to find more information  Centers for Disease Control and Prevention, STEADI: https://garcia.biz/  [employer] on Aging: BrainJudge.co.uk Contact a doctor if:  You are afraid of falling at home.  You feel weak, drowsy, or dizzy at home.  You fall at home. Summary  There are many simple things that you can do to make your home safe and to help prevent falls.  Ways to make your home safe include removing tripping hazards and installing grab bars in the bathroom.  Ask for help when making these changes in your home. This information is not intended to replace advice given to you by your health care provider. Make sure you discuss any questions you have with your health care provider. Document Released: 10/23/2008 Document Revised: 08/11/2016 Document Reviewed: 08/11/2016 Elsevier Interactive Patient Education  2019 [place] Maintenance, Female Adopting a healthy lifestyle and getting preventive care can go a long way to promote health and wellness. Talk with  your health care provider about what schedule of regular examinations is right for you. This is a good chance for you to check in with your provider about disease prevention and staying healthy. In between checkups, there are plenty of things you can do on your own. Experts have done a lot of research about which lifestyle changes and preventive measures are most likely to keep you healthy. Ask your health care provider for more information. Weight and diet Eat a healthy  diet  Be sure to include plenty of vegetables, fruits, low-fat dairy products, and lean protein.  Do not eat a lot of foods high in solid fats, added sugars, or salt.  Get regular exercise. This is one of the most important things you can do for your health. ? Most adults should exercise for at least 150 minutes each week. The exercise should increase your heart rate and make you sweat (moderate-intensity exercise). ? Most adults should also do strengthening exercises at least twice a week. This is in addition to the moderate-intensity exercise. Maintain a healthy weight  Body mass index (BMI) is a measurement that can be used to identify possible weight problems. It estimates body fat based on height and weight. Your health care provider can help determine your BMI and help you achieve or maintain a healthy weight.  For females 64 years of age and older: ? A BMI below 18.5 is considered underweight. ? A BMI of 18.5 to 24.9 is normal. ? A BMI of 25 to 29.9 is considered overweight. ? A BMI of 30 and above is considered obese. Watch levels of cholesterol and blood lipids  You should start having your blood tested for lipids and cholesterol at 83 years of age, then have this test every 5 years.  You may need to have your cholesterol levels checked more often if: ? Your lipid or cholesterol levels are high. ? You are older than 83 years of age. ? You are at high risk for heart disease. Cancer screening Lung Cancer  Lung cancer screening is recommended for adults 51-59 years old who are at high risk for lung cancer because of a history of smoking.  A yearly low-dose CT scan of the lungs is recommended for people who: ? Currently smoke. ? Have quit within the past 15 years. ? Have at least a 30-pack-year history of smoking. A pack year is smoking an average of one pack of cigarettes a day for 1 year.  Yearly screening should continue until it has been 15 years since you quit.  Yearly  screening should stop if you develop a health problem that would prevent you from having lung cancer treatment. Breast Cancer  Practice breast self-awareness. This means understanding how your breasts normally appear and feel.  It also means doing regular breast self-exams. Let your health care provider know about any changes, no matter how small.  If you are in your 20s or 30s, you should have a clinical breast exam (CBE) by a health care provider every 1-3 years as part of a regular health exam.  If you are 80 or older, have a CBE every year. Also consider having a breast X-ray (mammogram) every year.  If you have a family history of breast cancer, talk to your health care provider about genetic screening.  If you are at high risk for breast cancer, talk to your health care provider about having an MRI and a mammogram every year.  Breast cancer gene (BRCA) assessment is recommended  for women who have family members with BRCA-related cancers. BRCA-related cancers include: ? Breast. ? Ovarian. ? Tubal. ? Peritoneal cancers.  Results of the assessment will determine the need for genetic counseling and BRCA1 and BRCA2 testing. Cervical Cancer Your health care provider may recommend that you be screened regularly for cancer of the pelvic organs (ovaries, uterus, and vagina). This screening involves a pelvic examination, including checking for microscopic changes to the surface of your cervix (Pap test). You may be encouraged to have this screening done every 3 years, beginning at age 44.  For women ages 85-65, health care providers may recommend pelvic exams and Pap testing every 3 years, or they may recommend the Pap and pelvic exam, combined with testing for human papilloma virus (HPV), every 5 years. Some types of HPV increase your risk of cervical cancer. Testing for HPV may also be done on women of any age with unclear Pap test results.  Other health care providers may not recommend any  screening for nonpregnant women who are considered low risk for pelvic cancer and who do not have symptoms. Ask your health care provider if a screening pelvic exam is right for you.  If you have had past treatment for cervical cancer or a condition that could lead to cancer, you need Pap tests and screening for cancer for at least 20 years after your treatment. If Pap tests have been discontinued, your risk factors (such as having a new sexual partner) need to be reassessed to determine if screening should resume. Some women have medical problems that increase the chance of getting cervical cancer. In these cases, your health care provider may recommend more frequent screening and Pap tests. Colorectal Cancer  This type of cancer can be detected and often prevented.  Routine colorectal cancer screening usually begins at 83 years of age and continues through 83 years of age.  Your health care provider may recommend screening at an earlier age if you have risk factors for colon cancer.  Your health care provider may also recommend using home test kits to check for hidden blood in the stool.  A small camera at the end of a tube can be used to examine your colon directly (sigmoidoscopy or colonoscopy). This is done to check for the earliest forms of colorectal cancer.  Routine screening usually begins at age 44.  Direct examination of the colon should be repeated every 5-10 years through 83 years of age. However, you may need to be screened more often if early forms of precancerous polyps or small growths are found. Skin Cancer  Check your skin from head to toe regularly.  Tell your health care provider about any new moles or changes in moles, especially if there is a change in a mole's shape or color.  Also tell your health care provider if you have a mole that is larger than the size of a pencil eraser.  Always use sunscreen. Apply sunscreen liberally and repeatedly throughout the  day.  Protect yourself by wearing long sleeves, pants, a wide-brimmed hat, and sunglasses whenever you are outside. Heart disease, diabetes, and high blood pressure  High blood pressure causes heart disease and increases the risk of stroke. High blood pressure is more likely to develop in: ? People who have blood pressure in the high end of the normal range (130-139/85-89 mm Hg). ? People who are overweight or obese. ? People who are African American.  If you are 41-26 years of age, have your blood  pressure checked every 3-5 years. If you are 52 years of age or older, have your blood pressure checked every year. You should have your blood pressure measured twice--once when you are at a hospital or clinic, and once when you are not at a hospital or clinic. Record the average of the two measurements. To check your blood pressure when you are not at a hospital or clinic, you can use: ? An automated blood pressure machine at a pharmacy. ? A home blood pressure monitor.  If you are between 39 years and 39 years old, ask your health care provider if you should take aspirin to prevent strokes.  Have regular diabetes screenings. This involves taking a blood sample to check your fasting blood sugar level. ? If you are at a normal weight and have a low risk for diabetes, have this test once every three years after 83 years of age. ? If you are overweight and have a high risk for diabetes, consider being tested at a younger age or more often. Preventing infection Hepatitis B  If you have a higher risk for hepatitis B, you should be screened for this virus. You are considered at high risk for hepatitis B if: ? You were born in a country where hepatitis B is common. Ask your health care provider which countries are considered high risk. ? Your parents were born in a high-risk country, and you have not been immunized against hepatitis B (hepatitis B vaccine). ? You have HIV or AIDS. ? You use needles to  inject street drugs. ? You live with someone who has hepatitis B. ? You have had sex with someone who has hepatitis B. ? You get hemodialysis treatment. ? You take certain medicines for conditions, including cancer, organ transplantation, and autoimmune conditions. Hepatitis C  Blood testing is recommended for: ? Everyone born from [birth year] through 1963. ? Anyone with known risk factors for hepatitis C. Sexually transmitted infections (STIs)  You should be screened for sexually transmitted infections (STIs) including gonorrhea and chlamydia if: ? You are sexually active and are younger than 83 years of age. ? You are older than 83 years of age and your health care provider tells you that you are at risk for this type of infection. ? Your sexual activity has changed since you were last screened and you are at an increased risk for chlamydia or gonorrhea. Ask your health care provider if you are at risk.  If you do not have HIV, but are at risk, it may be recommended that you take a prescription medicine daily to prevent HIV infection. This is called pre-exposure prophylaxis (PrEP). You are considered at risk if: ? You are sexually active and do not regularly use condoms or know the HIV status of your partner(s). ? You take drugs by injection. ? You are sexually active with a partner who has HIV. Talk with your health care provider about whether you are at high risk of being infected with HIV. If you choose to begin PrEP, you should first be tested for HIV. You should then be tested every 3 months for as long as you are taking PrEP. Pregnancy  If you are premenopausal and you may become pregnant, ask your health care provider about preconception counseling.  If you may become pregnant, take 400 to 800 micrograms (mcg) of folic acid every day.  If you want to prevent pregnancy, talk to your health care provider about birth control (contraception). Osteoporosis and menopause  Osteoporosis  is a  disease in which the bones lose minerals and strength with aging. This can result in serious bone fractures. Your risk for osteoporosis can be identified using a bone density scan.  If you are 75 years of age or older, or if you are at risk for osteoporosis and fractures, ask your health care provider if you should be screened.  Ask your health care provider whether you should take a calcium or vitamin D supplement to lower your risk for osteoporosis.  Menopause may have certain physical symptoms and risks.  Hormone replacement therapy may reduce some of these symptoms and risks. Talk to your health care provider about whether hormone replacement therapy is right for you. Follow these instructions at home:  Schedule regular health, dental, and eye exams.  Stay current with your immunizations.  Do not use any tobacco products including cigarettes, chewing tobacco, or electronic cigarettes.  If you are pregnant, do not drink alcohol.  If you are breastfeeding, limit how much and how often you drink alcohol.  Limit alcohol intake to no more than 1 drink per day for nonpregnant women. One drink equals 12 ounces of beer, 5 ounces of wine, or 1 ounces of hard liquor.  Do not use street drugs.  Do not share needles.  Ask your health care provider for help if you need support or information about quitting drugs.  Tell your health care provider if you often feel depressed.  Tell your health care provider if you have ever been abused or do not feel safe at home. This information is not intended to replace advice given to you by your health care provider. Make sure you discuss any questions you have with your health care provider. Document Released: 07/12/2010 Document Revised: 06/04/2015 Document Reviewed: 09/30/2014 Elsevier Interactive Patient Education  2019 [employer].

## 2018-07-02 NOTE — Progress Notes (Signed)
TELEHEALTH - AUDIO only   This is a telephone encounter between BRITTA LOUTH and Velora Heckler on 07/02/2018 for an AWV. The visit was conducted with the patient located at home and Velora Heckler at [facility]. The patient's identity was confirmed using their DOB and current address. The patient has consented to being evaluated through a telephone encounter and understands the associated risks (an examination cannot be done and the patient may need to come in for an appointment) / benefits (allows the patient to remain at home, decreasing exposure to coronavirus). I personally spent 34 minutes on medical discussion.    Subjective:   Jennifer Owen is a 85 y.o. female who presents for a Medicare Annual Wellness Visit with daughter, Manus Gunning.  The following items have been reviewed and updated today in the appropriate area in the EMR.   Health Risk Assessment  Height, weight, BMI, and BP (Patient does not have a home BP cuff or scale) Visual acuity if needed NA Depression screen Fall risk / safety level Advance directive discussion Medical and family history were reviewed and updated Updating list of other providers & suppliers Medication reconciliation, including over the counter medicines (This was done with daughter, Manus Gunning) Cognitive screen Written screening schedule Risk Factor list Personalized health advice, risky behaviors, and treatment advice  Social History   Social History Narrative   Current Social History 07/02/2018        Patient lives with family (Daughter, Manus Gunning and grandson) in a home which is 1 story. There are 2 steps with handrail on one side up to the entrance the patient uses.       Patient's method of transportation is personal car, driven by daughter.      The highest level of education was high school diploma.      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      Identified important Relationships  are Daughter and 18 grandchildren.       Pets : None       Interests / Fun: work in garden, Architectural technologist, going to church daily for activities, read bible.       Current Stressors: None       Religious / Personal Beliefs: [place] Ducatte, RN, BSN            Objective:    Vitals: There were no vitals taken for this visit. Vitals are unable to obtained due to VOJJK-09 public health emergency  Activities of Daily Living In your present state of health, do you have any difficulty performing the following activities: 07/02/2018 03/19/2018  Hearing? N N  Vision? N N  Difficulty concentrating or making decisions? Y Y  Comment "Sometimes" -  Walking or climbing stairs? Y Y  Comment "Sometimes" -  Dressing or bathing? N Y  Doing errands, shopping? Y Y  Some recent data might be hidden    Goals Goals    . Blood Pressure < 150/90    . Exercise 3x per week     Before Covid-19 restrictions patient went to church everyday where she was led in exercises while sitting, standing and walking stairs. Patient will begin to do these same exercises at home 3 days per week until she's able to resume with church group.    Marland Kitchen LDL CALC < 130       Fall Risk Fall Risk  07/02/2018 03/19/2018 01/16/2018 07/24/2017 04/04/2017  Falls in the past year? 0 0 0 No No  Number falls in past yr: - - - - -  Injury with Fall? - - - - -  Risk Factor Category  - - - - -  Risk for fall due to : Impaired balance/gait;Impaired mobility Impaired mobility;Impaired balance/gait - Other (Comment) -  Risk for fall due to: Comment - - - - -  Follow up - - Falls prevention discussed - -  Comment - - - - -    Depression Screen PHQ 2/9 Scores 07/02/2018 03/19/2018 01/16/2018 07/24/2017  PHQ - 2 Score 0 0 0 0  PHQ- 9 Score 0 - 0 -     Cognitive Testing Six-Item Cognitive Screener   "I would like to ask you some questions that ask you to use your memory. I am going to name three objects. Please wait until I  say all three words, then repeat them. Remember what they are  because I am going to ask you to name them again in a few minutes. Please repeat these words for me: APPLE-TABLE-PENNY." (Interviewer may repeat names 3 times if necessary but repetition not scored.)  Did patient correctly repeat all three words? Yes - may proceed with screen  What year is this? Correct What month is this? Correct What day of the week is this? Correct  What were the three objects I asked you to remember? . Apple Correct . Table Correct . Boyd Kerbsenny Unable to remember  Score one point for each incorrect answer.  A score of 2 or more points warrants additional investigation.  Patient's score 1    Assessment and Plan:     Daughter states patient has stopped coughing after stopping albuterol, mucinex, tessalon perles and allergy med. Routed to PCP to see if these should be removed from patient's med list.  During the course of the visit the patient was educated and counseled about appropriate screening and preventive services as documented in the assessment and plan.  The printed AVS was mailed to the patient and included an updated screening schedule, a list of risk factors, and personalized health advice.   Kinnie FeilL. Ducatte, RN, BSN       Fredderick SeveranceLaurenze L Ducatte, RN  07/02/2018

## 2018-07-03 NOTE — Addendum Note (Signed)
Addended by: Modena Nunnery D on: 07/03/2018 10:41 AM   Modules accepted: Orders

## 2018-07-25 NOTE — Progress Notes (Signed)
Internal Medicine Clinic Attending  Case discussed with Dr. Bloomfield at the time of the visit.  We reviewed the AWV findings.  I agree with the assessment, diagnosis, and plan of care documented in the AWV note.     

## 2018-09-18 ENCOUNTER — Ambulatory Visit (INDEPENDENT_AMBULATORY_CARE_PROVIDER_SITE_OTHER): Payer: Medicare Other | Admitting: Podiatry

## 2018-09-18 ENCOUNTER — Encounter: Payer: Self-pay | Admitting: Podiatry

## 2018-09-18 ENCOUNTER — Other Ambulatory Visit: Payer: Self-pay

## 2018-09-18 DIAGNOSIS — B351 Tinea unguium: Secondary | ICD-10-CM | POA: Diagnosis not present

## 2018-09-18 DIAGNOSIS — M79674 Pain in right toe(s): Secondary | ICD-10-CM | POA: Diagnosis not present

## 2018-09-18 DIAGNOSIS — M79675 Pain in left toe(s): Secondary | ICD-10-CM

## 2018-09-18 DIAGNOSIS — G609 Hereditary and idiopathic neuropathy, unspecified: Secondary | ICD-10-CM

## 2018-09-18 NOTE — Progress Notes (Signed)
Complaint:  Visit Type: Patient returns to my office for continued preventative foot care services. Complaint: Patient states" my nails have grown long and thick and become painful to walk and wear shoes" Patient has been diagnosed with DM with no foot complications. The patient presents for preventative foot care services. No changes to ROS.  Patient presents to the office with her daughter.  Podiatric Exam: Vascular: dorsalis pedis and posterior tibial pulses are palpable bilateral. Capillary return is immediate. Temperature gradient is WNL. Skin turgor WNL  Sensorium: Normal Semmes Weinstein monofilament test. Normal tactile sensation bilaterally. Nail Exam: Pt has thick disfigured discolored nails with subungual debris noted bilateral entire nail hallux through fifth toenails Ulcer Exam: There is no evidence of ulcer or pre-ulcerative changes or infection. Orthopedic Exam: Muscle tone and strength are WNL. No limitations in general ROM. No crepitus or effusions noted. Foot type and digits show no abnormalities. Bony prominences are unremarkable. Skin: No Porokeratosis. No infection or ulcers  Diagnosis:  Onychomycosis, , Pain in right toe, pain in left toes  Treatment & Plan Procedures and Treatment: Consent by patient was obtained for treatment procedures.   Debridement of mycotic and hypertrophic toenails, 1 through 5 bilateral and clearing of subungual debris. No ulceration, no infection noted.  Return Visit-Office Procedure: Patient instructed to return to the office for a follow up visit 3 months for continued evaluation and treatment.    Gardiner Barefoot DPM

## 2018-10-16 ENCOUNTER — Encounter: Payer: Self-pay | Admitting: Internal Medicine

## 2018-10-16 ENCOUNTER — Other Ambulatory Visit: Payer: Self-pay

## 2018-10-16 ENCOUNTER — Ambulatory Visit (INDEPENDENT_AMBULATORY_CARE_PROVIDER_SITE_OTHER): Payer: Medicare Other | Admitting: Internal Medicine

## 2018-10-16 VITALS — BP 134/60 | HR 59 | Wt 185.4 lb

## 2018-10-16 DIAGNOSIS — R42 Dizziness and giddiness: Secondary | ICD-10-CM | POA: Insufficient documentation

## 2018-10-16 DIAGNOSIS — H6121 Impacted cerumen, right ear: Secondary | ICD-10-CM

## 2018-10-16 NOTE — Patient Instructions (Addendum)
Thank you for allowing [place] to care for you  For you dizziness - This is consistent with vertigo - I have place a referral to physical therapy to help with maneuvers to improve your symptoms - If symptoms worsen or fail to improved with PT we will consider referral to ENT  Please follow up with PCP when you are able as it has been over 6 months.

## 2018-10-16 NOTE — Assessment & Plan Note (Signed)
Patient is accompanied by her daughter. They report about 1 week of intemettent dizziness. She reports sensation of dizziness with mild room spinning when she first gets out of bed in the morning. She call for help from her family so she can get to the bathroom with out falling. This sensation can last, in varying severity, for up 1 hour in the mornings. She has also had shorter episodes at times when she gets up (from laying or sitting) later in the day, but this a brief. She reports mild intermittent tinnitus at times for the past week as well, but denies any other changes in her hearing.  On exam she has negative dix hallpike maneuver (suboptimally performed due to age of patient); but does endorse brief episode of dizziness after sitting back upright rapidly following maneuver. No nystagmus noted.  Will treat for BPPV given her presentation. Meclizine is on the Bear's list, so we will avoid this. Will focus on improvement via vestibular PT. If her symptoms worsen or fail to improve we will consider referral to ENT for further evaluation.  - Vestibular PT

## 2018-10-16 NOTE — Progress Notes (Signed)
Internal Medicine Clinic Attending  Case discussed with Dr. Melvin at the time of the visit.  We reviewed the resident's history and exam and pertinent patient test results.  I agree with the assessment, diagnosis, and plan of care documented in the resident's note.  Alexander Raines, M.D., Ph.D.  

## 2018-10-16 NOTE — Progress Notes (Signed)
   CC: Dizziness  HPI:  [name] is a 85 y.o. F with PMHx listed below presenting for Dizziness. Please see the A&P for the status of the patient's chronic medical problems.  Past Medical History:  Diagnosis Date  . GERD (gastroesophageal reflux disease)    Papulous gastropathy EGD Oct. 2007  . Hyperlipemia   . Hypertension   . IBS (irritable bowel syndrome)   . Low back pain    intermittent radiculopathy  . Osteoarthritis   . Osteopenia    T score-1.1 R Fremur, - 0.4 L spine  . Polio 1942  . Pulmonary embolism (HCC)    2/2 knee replacement  . Urinary incontinence    Review of Systems:  Performed and all others negative.  Physical Exam:  There were no vitals filed for this visit. Physical Exam Constitutional:      General: She is not in acute distress.    Appearance: Normal appearance.  HENT:     Right Ear: Tympanic membrane, ear canal and external ear normal. There is impacted cerumen.     Left Ear: Tympanic membrane, ear canal and external ear normal.     Ears:     Comments: R TM incompletely visualized due to cerumen Negative Marye Round, but positive for dizziness up sitting up rapidly following maneuver Cardiovascular:     Rate and Rhythm: Normal rate and regular rhythm.     Pulses: Normal pulses.     Heart sounds: Normal heart sounds.  Pulmonary:     Effort: Pulmonary effort is normal. No respiratory distress.     Breath sounds: Normal breath sounds.  Abdominal:     General: Bowel sounds are normal. There is no distension.     Palpations: Abdomen is soft.     Tenderness: There is no abdominal tenderness.  Musculoskeletal:        General: No swelling or deformity.  Skin:    General: Skin is warm and dry.  Neurological:     General: No focal deficit present.     Mental Status: Mental status is at baseline.    Assessment & Plan:   See Encounters Tab for problem based charting.  Patient discussed with Dr. Rebeca Alert

## 2018-10-17 ENCOUNTER — Other Ambulatory Visit: Payer: Self-pay | Admitting: Internal Medicine

## 2018-11-06 ENCOUNTER — Other Ambulatory Visit: Payer: Self-pay | Admitting: Internal Medicine

## 2018-11-06 DIAGNOSIS — I10 Essential (primary) hypertension: Secondary | ICD-10-CM

## 2018-11-12 ENCOUNTER — Encounter: Payer: Self-pay | Admitting: Internal Medicine

## 2018-11-12 ENCOUNTER — Other Ambulatory Visit: Payer: Self-pay

## 2018-11-12 ENCOUNTER — Ambulatory Visit (INDEPENDENT_AMBULATORY_CARE_PROVIDER_SITE_OTHER): Payer: Medicare Other | Admitting: Internal Medicine

## 2018-11-12 DIAGNOSIS — F0390 Unspecified dementia without behavioral disturbance: Secondary | ICD-10-CM

## 2018-11-12 DIAGNOSIS — M159 Polyosteoarthritis, unspecified: Secondary | ICD-10-CM

## 2018-11-12 DIAGNOSIS — R42 Dizziness and giddiness: Secondary | ICD-10-CM

## 2018-11-12 DIAGNOSIS — Z79899 Other long term (current) drug therapy: Secondary | ICD-10-CM

## 2018-11-12 DIAGNOSIS — F0280 Dementia in other diseases classified elsewhere without behavioral disturbance: Secondary | ICD-10-CM

## 2018-11-12 DIAGNOSIS — I10 Essential (primary) hypertension: Secondary | ICD-10-CM | POA: Diagnosis not present

## 2018-11-12 DIAGNOSIS — G308 Other Alzheimer's disease: Secondary | ICD-10-CM

## 2018-11-12 NOTE — Progress Notes (Signed)
  [facility] Internal Medicine Residency Telephone Encounter Continuity Care Appointment  HPI:   This telephone encounter was created for [name] on 11/12/2018 for the following purpose/cc follow-up on vertigo, chronic OA, HTN.    Past Medical History:  Past Medical History:  Diagnosis Date  . GERD (gastroesophageal reflux disease)    Papulous gastropathy EGD Oct. 2007  . Hyperlipemia   . Hypertension   . IBS (irritable bowel syndrome)   . Low back pain    intermittent radiculopathy  . Osteoarthritis   . Osteopenia    T score-1.1 R Fremur, - 0.4 L spine  . Polio 1942  . Pulmonary embolism (HCC)    2/2 knee replacement  . Urinary incontinence       ROS:   Negative for recent falls, headaches, fevers, chills, upper respiratory symptoms, chest pain, shortness of breath, constipation, urinary symptoms.    Assessment / Plan / Recommendations:   Please see A&P under problem oriented charting for assessment of the patient's acute and chronic medical conditions.   As always, pt is advised that if symptoms worsen or new symptoms arise, they should go to an urgent care facility or to to ER for further evaluation.   Consent and Medical Decision Making:   Patient discussed with Dr. Dareen Piano  This is a telephone encounter between Jennifer Owen and Jennifer Owen on 11/12/2018 for follow-up on vertigo and chronic medical conditions. The visit was conducted with the patient located at home and Jennifer Owen at [facility]. The patient's identity was confirmed using their DOB and current address. The patient has consented to being evaluated through a telephone encounter and understands the associated risks (an examination cannot be done and the patient may need to come in for an appointment) / benefits (allows the patient to remain at home, decreasing exposure to coronavirus). I personally spent 15 minutes on medical discussion.

## 2018-11-12 NOTE — Assessment & Plan Note (Signed)
Patient currently uses a cane to assist with ambulation. She requests a walking roller with a seat to help her when she goes out in public. This is reasonable in order to help reduce fall risk. She has fortunately not fallen in approximately 4 months. Will place DME order today.

## 2018-11-12 NOTE — Assessment & Plan Note (Signed)
Patient was able to converse with me appropriately today with only minimal assistance from daughter who was nearby. She and her daughter do not have any concerns regarding her care at this time.

## 2018-11-12 NOTE — Assessment & Plan Note (Signed)
Patient does not routinely check blood pressure at home, but denies symptoms of headache, vision changes, chest pain, shortness of breath or lower extremity edema above baseline. She also denies symptoms of orthostatic hypotension.  Continue current management with Amlodipine 10 mg, HCTZ 25 mg, and Lisinopril 40 mg.  Will need repeat BMP at next in-person follow-up.

## 2018-11-12 NOTE — Assessment & Plan Note (Signed)
Patient endorses her symptoms have slowly improved since [facility] visit on 10/6. She did not end up going to vestibular PT, but does not feel she needs it at this time.

## 2018-11-13 NOTE — Progress Notes (Signed)
Internal Medicine Clinic Attending  Case discussed with Dr. Bloomfield at the time of the visit.  We reviewed the resident's history and exam and pertinent patient test results.  I agree with the assessment, diagnosis, and plan of care documented in the resident's note.  

## 2018-11-19 ENCOUNTER — Other Ambulatory Visit: Payer: Self-pay | Admitting: Internal Medicine

## 2018-11-19 DIAGNOSIS — I10 Essential (primary) hypertension: Secondary | ICD-10-CM

## 2018-12-18 ENCOUNTER — Ambulatory Visit: Payer: Medicare Other | Admitting: Podiatry

## 2019-02-05 ENCOUNTER — Other Ambulatory Visit: Payer: Self-pay | Admitting: Internal Medicine

## 2019-02-27 ENCOUNTER — Emergency Department (HOSPITAL_COMMUNITY): Payer: Medicare Other

## 2019-02-27 ENCOUNTER — Encounter (HOSPITAL_COMMUNITY): Payer: Self-pay

## 2019-02-27 ENCOUNTER — Other Ambulatory Visit: Payer: Self-pay

## 2019-02-27 ENCOUNTER — Telehealth: Payer: Self-pay | Admitting: *Deleted

## 2019-02-27 ENCOUNTER — Inpatient Hospital Stay (HOSPITAL_COMMUNITY)
Admission: EM | Admit: 2019-02-27 | Discharge: 2019-03-06 | DRG: 643 | Disposition: A | Discharge status: Home Health | Payer: Medicare Other | Attending: Internal Medicine | Admitting: Internal Medicine

## 2019-02-27 DIAGNOSIS — Z885 Allergy status to narcotic agent status: Secondary | ICD-10-CM

## 2019-02-27 DIAGNOSIS — Z90710 Acquired absence of both cervix and uterus: Secondary | ICD-10-CM | POA: Diagnosis not present

## 2019-02-27 DIAGNOSIS — R443 Hallucinations, unspecified: Secondary | ICD-10-CM | POA: Diagnosis not present

## 2019-02-27 DIAGNOSIS — R4182 Altered mental status, unspecified: Secondary | ICD-10-CM | POA: Diagnosis not present

## 2019-02-27 DIAGNOSIS — Z801 Family history of malignant neoplasm of trachea, bronchus and lung: Secondary | ICD-10-CM

## 2019-02-27 DIAGNOSIS — G9340 Encephalopathy, unspecified: Secondary | ICD-10-CM | POA: Diagnosis not present

## 2019-02-27 DIAGNOSIS — Z820 Family history of epilepsy and other diseases of the nervous system: Secondary | ICD-10-CM

## 2019-02-27 DIAGNOSIS — Z6830 Body mass index (BMI) 30.0-30.9, adult: Secondary | ICD-10-CM

## 2019-02-27 DIAGNOSIS — E0590 Thyrotoxicosis, unspecified without thyrotoxic crisis or storm: Secondary | ICD-10-CM | POA: Diagnosis not present

## 2019-02-27 DIAGNOSIS — Z87891 Personal history of nicotine dependence: Secondary | ICD-10-CM

## 2019-02-27 DIAGNOSIS — Z9119 Patient's noncompliance with other medical treatment and regimen: Secondary | ICD-10-CM

## 2019-02-27 DIAGNOSIS — G92 Toxic encephalopathy: Secondary | ICD-10-CM | POA: Diagnosis present

## 2019-02-27 DIAGNOSIS — E0520 Thyrotoxicosis with toxic multinodular goiter without thyrotoxic crisis or storm: Secondary | ICD-10-CM | POA: Diagnosis present

## 2019-02-27 DIAGNOSIS — I10 Essential (primary) hypertension: Secondary | ICD-10-CM | POA: Diagnosis present

## 2019-02-27 DIAGNOSIS — Z96653 Presence of artificial knee joint, bilateral: Secondary | ICD-10-CM | POA: Diagnosis present

## 2019-02-27 DIAGNOSIS — I6389 Other cerebral infarction: Secondary | ICD-10-CM | POA: Diagnosis not present

## 2019-02-27 DIAGNOSIS — Z86711 Personal history of pulmonary embolism: Secondary | ICD-10-CM

## 2019-02-27 DIAGNOSIS — F0390 Unspecified dementia without behavioral disturbance: Secondary | ICD-10-CM | POA: Diagnosis present

## 2019-02-27 DIAGNOSIS — E669 Obesity, unspecified: Secondary | ICD-10-CM | POA: Diagnosis present

## 2019-02-27 DIAGNOSIS — Z800 Family history of malignant neoplasm of digestive organs: Secondary | ICD-10-CM | POA: Diagnosis not present

## 2019-02-27 DIAGNOSIS — Z66 Do not resuscitate: Secondary | ICD-10-CM | POA: Diagnosis present

## 2019-02-27 DIAGNOSIS — K219 Gastro-esophageal reflux disease without esophagitis: Secondary | ICD-10-CM | POA: Diagnosis present

## 2019-02-27 DIAGNOSIS — R4781 Slurred speech: Secondary | ICD-10-CM | POA: Diagnosis not present

## 2019-02-27 DIAGNOSIS — G459 Transient cerebral ischemic attack, unspecified: Secondary | ICD-10-CM

## 2019-02-27 DIAGNOSIS — Z79899 Other long term (current) drug therapy: Secondary | ICD-10-CM | POA: Diagnosis not present

## 2019-02-27 DIAGNOSIS — I6523 Occlusion and stenosis of bilateral carotid arteries: Secondary | ICD-10-CM | POA: Diagnosis present

## 2019-02-27 DIAGNOSIS — R4701 Aphasia: Secondary | ICD-10-CM | POA: Diagnosis present

## 2019-02-27 DIAGNOSIS — E785 Hyperlipidemia, unspecified: Secondary | ICD-10-CM | POA: Diagnosis present

## 2019-02-27 DIAGNOSIS — Z8612 Personal history of poliomyelitis: Secondary | ICD-10-CM

## 2019-02-27 DIAGNOSIS — D7589 Other specified diseases of blood and blood-forming organs: Secondary | ICD-10-CM | POA: Diagnosis present

## 2019-02-27 DIAGNOSIS — E041 Nontoxic single thyroid nodule: Secondary | ICD-10-CM | POA: Diagnosis not present

## 2019-02-27 DIAGNOSIS — Z20822 Contact with and (suspected) exposure to covid-19: Secondary | ICD-10-CM | POA: Diagnosis present

## 2019-02-27 DIAGNOSIS — R29706 NIHSS score 6: Secondary | ICD-10-CM | POA: Diagnosis present

## 2019-02-27 DIAGNOSIS — Z8673 Personal history of transient ischemic attack (TIA), and cerebral infarction without residual deficits: Secondary | ICD-10-CM | POA: Diagnosis not present

## 2019-02-27 DIAGNOSIS — M8580 Other specified disorders of bone density and structure, unspecified site: Secondary | ICD-10-CM | POA: Diagnosis present

## 2019-02-27 DIAGNOSIS — Z8249 Family history of ischemic heart disease and other diseases of the circulatory system: Secondary | ICD-10-CM | POA: Diagnosis not present

## 2019-02-27 DIAGNOSIS — M1990 Unspecified osteoarthritis, unspecified site: Secondary | ICD-10-CM | POA: Diagnosis present

## 2019-02-27 DIAGNOSIS — R7989 Other specified abnormal findings of blood chemistry: Secondary | ICD-10-CM

## 2019-02-27 DIAGNOSIS — H53462 Homonymous bilateral field defects, left side: Secondary | ICD-10-CM | POA: Diagnosis present

## 2019-02-27 DIAGNOSIS — Z7982 Long term (current) use of aspirin: Secondary | ICD-10-CM

## 2019-02-27 DIAGNOSIS — R410 Disorientation, unspecified: Secondary | ICD-10-CM | POA: Diagnosis present

## 2019-02-27 DIAGNOSIS — I6502 Occlusion and stenosis of left vertebral artery: Secondary | ICD-10-CM | POA: Diagnosis present

## 2019-02-27 DIAGNOSIS — Z96659 Presence of unspecified artificial knee joint: Secondary | ICD-10-CM | POA: Diagnosis not present

## 2019-02-27 LAB — CBC WITH DIFFERENTIAL/PLATELET
Abs Immature Granulocytes: 0 10*3/uL (ref 0.00–0.07)
Basophils Absolute: 0 10*3/uL (ref 0.0–0.1)
Basophils Relative: 1 %
Eosinophils Absolute: 0.1 10*3/uL (ref 0.0–0.5)
Eosinophils Relative: 2 %
HCT: 37.8 % (ref 36.0–46.0)
Hemoglobin: 12.1 g/dL (ref 12.0–15.0)
Immature Granulocytes: 0 %
Lymphocytes Relative: 40 %
Lymphs Abs: 1.6 10*3/uL (ref 0.7–4.0)
MCH: 35.2 pg — ABNORMAL HIGH (ref 26.0–34.0)
MCHC: 32 g/dL (ref 30.0–36.0)
MCV: 109.9 fL — ABNORMAL HIGH (ref 80.0–100.0)
Monocytes Absolute: 0.5 10*3/uL (ref 0.1–1.0)
Monocytes Relative: 12 %
Neutro Abs: 1.9 10*3/uL (ref 1.7–7.7)
Neutrophils Relative %: 45 %
Platelets: 224 10*3/uL (ref 150–400)
RBC: 3.44 MIL/uL — ABNORMAL LOW (ref 3.87–5.11)
RDW: 13.4 % (ref 11.5–15.5)
WBC: 4.1 10*3/uL (ref 4.0–10.5)
nRBC: 0 % (ref 0.0–0.2)

## 2019-02-27 LAB — COMPREHENSIVE METABOLIC PANEL
ALT: 22 U/L (ref 0–44)
AST: 22 U/L (ref 15–41)
Albumin: 3.4 g/dL — ABNORMAL LOW (ref 3.5–5.0)
Alkaline Phosphatase: 60 U/L (ref 38–126)
Anion gap: 12 (ref 5–15)
BUN: 23 mg/dL (ref 8–23)
CO2: 19 mmol/L — ABNORMAL LOW (ref 22–32)
Calcium: 10.1 mg/dL (ref 8.9–10.3)
Chloride: 110 mmol/L (ref 98–111)
Creatinine, Ser: 1.04 mg/dL — ABNORMAL HIGH (ref 0.44–1.00)
GFR calc Af Amer: 57 mL/min — ABNORMAL LOW (ref >60)
GFR calc non Af Amer: 49 mL/min — ABNORMAL LOW (ref >60)
Glucose, Bld: 93 mg/dL (ref 70–99)
Potassium: 4.3 mmol/L (ref 3.5–5.1)
Sodium: 141 mmol/L (ref 135–145)
Total Bilirubin: 0.8 mg/dL (ref 0.3–1.2)
Total Protein: 6.2 g/dL — ABNORMAL LOW (ref 6.5–8.1)

## 2019-02-27 LAB — TSH: TSH: 0.016 u[IU]/mL — ABNORMAL LOW (ref 0.350–4.500)

## 2019-02-27 LAB — URINALYSIS, ROUTINE W REFLEX MICROSCOPIC
Bilirubin Urine: NEGATIVE
Glucose, UA: NEGATIVE mg/dL
Hgb urine dipstick: NEGATIVE
Ketones, ur: NEGATIVE mg/dL
Leukocytes,Ua: NEGATIVE
Nitrite: NEGATIVE
Protein, ur: NEGATIVE mg/dL
Specific Gravity, Urine: 1.017 (ref 1.005–1.030)
pH: 5 (ref 5.0–8.0)

## 2019-02-27 LAB — HEMOGLOBIN A1C
Hgb A1c MFr Bld: 5.2 % (ref 4.8–5.6)
Mean Plasma Glucose: 102.54 mg/dL

## 2019-02-27 LAB — VITAMIN B12: Vitamin B-12: 414 pg/mL (ref 180–914)

## 2019-02-27 LAB — LACTIC ACID, PLASMA: Lactic Acid, Venous: 1.3 mmol/L (ref 0.5–1.9)

## 2019-02-27 LAB — LIPID PANEL
Cholesterol: 157 mg/dL (ref 0–200)
HDL: 98 mg/dL (ref >40)
LDL Cholesterol: 46 mg/dL (ref 0–99)
Total CHOL/HDL Ratio: 1.6 RATIO
Triglycerides: 65 mg/dL (ref <150)
VLDL: 13 mg/dL (ref 0–40)

## 2019-02-27 LAB — AMMONIA: Ammonia: 13 umol/L (ref 9–35)

## 2019-02-27 LAB — ETHANOL: Alcohol, Ethyl (B): <10 mg/dL (ref <10)

## 2019-02-27 LAB — MAGNESIUM: Magnesium: 1 mg/dL — ABNORMAL LOW (ref 1.7–2.4)

## 2019-02-27 MED ORDER — SENNOSIDES-DOCUSATE SODIUM 8.6-50 MG PO TABS: 1.0000 | ORAL_TABLET | Freq: Every evening | ORAL | Status: DC | PRN

## 2019-02-27 MED ORDER — ENOXAPARIN SODIUM 40 MG/0.4ML ~~LOC~~ SOLN
40.0000 mg | SUBCUTANEOUS | Status: DC
  Administered 2019-02-28 – 2019-03-06 (×7): 40 mg via SUBCUTANEOUS
  Filled 2019-02-27 (×7): qty 0.4

## 2019-02-27 MED ORDER — ENOXAPARIN SODIUM 40 MG/0.4ML ~~LOC~~ SOLN: 40.0000 mg | SUBCUTANEOUS | Status: DC

## 2019-02-27 MED ORDER — ASPIRIN 81 MG PO CHEW: 81.0000 mg | CHEWABLE_TABLET | Freq: Every day | ORAL | Status: DC

## 2019-02-27 MED ORDER — ATORVASTATIN CALCIUM 40 MG PO TABS
40.0000 mg | ORAL_TABLET | Freq: Every day | ORAL | Status: DC
  Administered 2019-02-27 – 2019-03-06 (×8): 40 mg via ORAL
  Filled 2019-02-27 (×8): qty 1

## 2019-02-27 MED ORDER — ASPIRIN 81 MG PO CHEW
81.0000 mg | CHEWABLE_TABLET | Freq: Every day | ORAL | Status: DC
  Administered 2019-02-28 – 2019-03-05 (×6): 81 mg via ORAL
  Filled 2019-02-27 (×6): qty 1

## 2019-02-27 MED ORDER — ASPIRIN 300 MG RE SUPP: 300.0000 mg | RECTAL | Status: AC

## 2019-02-27 MED ORDER — LISINOPRIL 20 MG PO TABS: 40.0000 mg | ORAL_TABLET | Freq: Every day | ORAL | Status: DC

## 2019-02-27 MED ORDER — ASPIRIN 325 MG PO TABS
325.0000 mg | ORAL_TABLET | ORAL | Status: AC
  Administered 2019-02-28: 02:00:00 325 mg via ORAL
  Filled 2019-02-27 (×2): qty 1

## 2019-02-27 MED ORDER — LORAZEPAM 2 MG/ML IJ SOLN
1.0000 mg | Freq: Once | INTRAMUSCULAR | Status: AC
  Administered 2019-02-27: 18:00:00 1 mg via INTRAVENOUS
  Filled 2019-02-27: qty 1

## 2019-02-27 MED ORDER — PANTOPRAZOLE SODIUM 40 MG PO TBEC
40.0000 mg | DELAYED_RELEASE_TABLET | Freq: Every day | ORAL | Status: DC
  Administered 2019-02-27 – 2019-03-06 (×8): 40 mg via ORAL
  Filled 2019-02-27 (×8): qty 1

## 2019-02-27 MED ORDER — AMLODIPINE BESYLATE 10 MG PO TABS
10.0000 mg | ORAL_TABLET | Freq: Every day | ORAL | Status: DC
  Administered 2019-02-27 – 2019-03-06 (×7): 10 mg via ORAL
  Filled 2019-02-27 (×2): qty 1
  Filled 2019-02-27: qty 2
  Filled 2019-02-27 (×3): qty 1
  Filled 2019-02-27: qty 2
  Filled 2019-02-27: qty 1

## 2019-02-27 NOTE — Telephone Encounter (Signed)
Pt's daughter calls and states 2/16 pm pt started having slurred speech and confusion. She denies facial drooping, or noticed weakness. She is very worried. Daughter is advised to call 911 for possible stroke and triage called ED Chg nurse to alert ED Do you agree?

## 2019-02-27 NOTE — ED Notes (Signed)
While in MRI, pt would not sit still, refused to cooperate with staff for imaging. Pt was administered 1mg  ativan so that imaging could be obtained.

## 2019-02-27 NOTE — ED Provider Notes (Signed)
[facility] EMERGENCY DEPARTMENT Provider Note   CSN: [id number] Arrival date & time: 02/27/19  1149     History Chief Complaint  Patient presents with  . Altered Mental Status    Jennifer Owen is a 86 y.o. female history of GERD, hyperlipidemia, hypertension, IBS, dementia who presents for evaluation of altered mental status, confusion that has been ongoing for last 2 days.  Patient lives with daughter who states that patient does have a history of dementia and is some times confused and forgetful but is otherwise able to carry out conversation.  She noticed that about 2 days ago, she started acting abnormally.  She states that she was mumbling and was not able to really carry on a conversation.  Additionally, she states she was confused, for instance was wanting to go the bathroom in the kitchen.  Daughter states this is worse from baseline.  Daughter has not noted any fevers, vomiting.  She has been eating and drink with any difficulty.  Daughter knows of no falls.  EM LEVEL 5 CAVEAT  The history is provided by a relative.       Past Medical History:  Diagnosis Date  . GERD (gastroesophageal reflux disease)    Papulous gastropathy EGD Oct. 2007  . Hyperlipemia   . Hypertension   . IBS (irritable bowel syndrome)   . Low back pain    intermittent radiculopathy  . Osteoarthritis   . Osteopenia    T score-1.1 R Fremur, - 0.4 L spine  . Polio 1942  . Pulmonary embolism (HCC)    2/2 knee replacement  . Urinary incontinence     Patient Active Problem List   Diagnosis Date Noted  . Acute encephalopathy 02/27/2019  . Vertigo 10/16/2018  . Chronic cough 01/16/2018  . Dementia (HCC) 03/08/2017  . Headache 10/01/2015  . Dark stools 06/22/2015  . History of CVA (cerebrovascular accident) 06/17/2014  . Healthcare maintenance 06/16/2014  . Frequent falls 04/28/2014  . Bradycardia 11/20/2013  . Hypercalcemia 06/07/2013  . Knee pain, chronic 01/14/2013   . NONEXUDATIVE SENILE MACULAR DEGENERATION RETINA 03/20/2008  . CONSTIPATION, CHRONIC 11/27/2006  . HLD (hyperlipidemia) 12/15/2005  . PULMONARY EMBOLISM, HX OF 12/15/2005  . Essential hypertension 10/26/2005  . GERD 10/26/2005  . Osteoarthritis of multiple joints 10/26/2005  . Knee joint replacement by other means 10/26/2005    Past Surgical History:  Procedure Laterality Date  . ABDOMINAL HYSTERECTOMY  1985  . JOINT REPLACEMENT     total knee - left 1995, right 2004  . OOPHORECTOMY  1985     OB History   No obstetric history on file.     Family History  Problem Relation Age of Onset  . Cancer Daughter        colon caner, <60  . Cancer Mother        lung  . Alzheimer's disease Father   . Heart disease Son     Social History   Tobacco Use  . Smoking status: Former Smoker    Quit date: 1945    Years since quitting: 76.1  . Smokeless tobacco: Never Used  Substance Use Topics  . Alcohol use: No    Alcohol/week: 0.0 standard drinks    Comment: Christmas time only  . Drug use: No    Home Medications Prior to Admission medications   Medication Sig Start Date End Date Taking? Authorizing Provider  acetaminophen (TYLENOL) 500 MG tablet Take 500 mg by mouth every 6 (six) hours  as needed for mild pain.   Yes [provider]  amLODipine (NORVASC) 10 MG tablet TAKE 1 TABLET BY MOUTH DAILY. Patient taking differently: Take 10 mg by mouth daily.  11/06/18  Yes Bloomfield, Carley D, DO  aspirin 81 MG chewable tablet Chew 81 mg by mouth daily.     Yes [provider]  atorvastatin (LIPITOR) 40 MG tablet TAKE 1 TABLET BY MOUTH DAILY. Patient taking differently: Take 40 mg by mouth daily.  11/19/18  Yes Bloomfield, Carley D, DO  diclofenac sodium (VOLTAREN) 1 % GEL Apply 2 g upto 4 times a day to your knees as needed for knee pain. Patient taking differently: Apply 2 g topically daily. For knee pain 03/16/15  Yes Ahmed, Chesley Mires, MD  esomeprazole (NEXIUM) 40 MG  capsule TAKE 1 CAPSULE BY MOUTH DAILY BEFORE BREAKFAST. Patient taking differently: Take 40 mg by mouth daily.  02/05/19  Yes Bloomfield, Carley D, DO  hydrochlorothiazide (HYDRODIURIL) 25 MG tablet TAKE 1 TABLET BY MOUTH DAILY. Patient taking differently: Take 25 mg by mouth daily.  11/19/18  Yes Bloomfield, Carley D, DO  lisinopril (ZESTRIL) 40 MG tablet Take 40 mg by mouth daily.  08/22/18  Yes [provider]  memantine (NAMENDA) 5 MG tablet TAKE 1 TABLET BY MOUTH 2 TIMES DAILY. Patient taking differently: Take 5 mg by mouth daily.  11/19/18  Yes Bloomfield, Carley D, DO  simvastatin (ZOCOR) 20 MG tablet Take 20 mg by mouth every evening.  03/22/11  [provider]    Allergies    Tramadol hcl  Review of Systems   Review of Systems  Unable to perform ROS: Dementia    Physical Exam Updated Vital Signs BP 132/79   Pulse 72   Temp 98 F (36.7 C) (Oral)   Resp (!) 22   SpO2 100%   Physical Exam Vitals and nursing note reviewed.  Constitutional:      Appearance: Normal appearance. She is well-developed.  HENT:     Head: Normocephalic and atraumatic.  Eyes:     General: Lids are normal.     Conjunctiva/sclera: Conjunctivae normal.     Pupils: Pupils are equal, round, and reactive to light.     Comments: PERRL. EOMs intact. No nystagmus. No neglect.   Cardiovascular:     Rate and Rhythm: Normal rate and regular rhythm.     Pulses: Normal pulses.     Heart sounds: Normal heart sounds. No murmur. No friction rub. No gallop.   Pulmonary:     Effort: Pulmonary effort is normal.     Breath sounds: Normal breath sounds.  Abdominal:     Palpations: Abdomen is soft. Abdomen is not rigid.     Tenderness: There is no abdominal tenderness. There is no guarding.  Musculoskeletal:        General: Normal range of motion.     Cervical back: Full passive range of motion without pain.  Skin:    General: Skin is warm and dry.     Capillary Refill: Capillary refill takes  less than 2 seconds.  Neurological:     Mental Status: She is alert.     Comments: Intermittently follows some commands.  Unable to assess cranial nerves  5/5 strength of BUE and BLE Aphasia noted.  She can tell me her name but then cannot tell me where she is at, what year it is.  Additionally, she can identify her glasses but cannot other identify objects at times her hands.  She  cannot repeat the phrase no ifs, ands or buts.  She will start to talk and then mumble into words that do not make sense. No facial droop noted.   Psychiatric:        Speech: Speech normal.     ED Results / Procedures / Treatments   Labs (all labs ordered are listed, but only abnormal results are displayed) Labs Reviewed  COMPREHENSIVE METABOLIC PANEL - Abnormal; Notable for the following components:      Result Value   CO2 19 (*)    Creatinine, Ser 1.04 (*)    Total Protein 6.2 (*)    Albumin 3.4 (*)    GFR calc non Af Amer 49 (*)    GFR calc Af Amer 57 (*)    All other components within normal limits  CBC WITH DIFFERENTIAL/PLATELET - Abnormal; Notable for the following components:   RBC 3.44 (*)    MCV 109.9 (*)    MCH 35.2 (*)    All other components within normal limits  SARS CORONAVIRUS 2 (TAT 6-24 HRS)  ETHANOL  URINALYSIS, ROUTINE W REFLEX MICROSCOPIC  HEMOGLOBIN A1C  MAGNESIUM  TSH  COMPREHENSIVE METABOLIC PANEL  LIPID PANEL  VITAMIN B12  RPR  METHYLMALONIC ACID, SERUM  AMMONIA  LACTIC ACID, PLASMA    EKG None  Radiology DG Chest 2 View  Result Date: 02/27/2019 CLINICAL DATA:  Shortness of breath, altered mental status. EXAM: CHEST - 2 VIEW COMPARISON:  04/19/2008. FINDINGS: Patient is slightly rotated. Trachea is midline. Heart size is accentuated by low lung volumes. Minimal basilar linear scarring. Lungs are otherwise clear. No pleural fluid. IMPRESSION: No acute findings. Electronically Signed   By: Leanna Battles M.D.   On: 02/27/2019 14:42   CT Head Wo Contrast  Result  Date: 02/27/2019 CLINICAL DATA:  Altered mental status EXAM: CT HEAD WITHOUT CONTRAST TECHNIQUE: Contiguous axial images were obtained from the base of the skull through the vertex without intravenous contrast. COMPARISON:  10/06/2011 FINDINGS: Brain: No evidence of acute infarction, hemorrhage, hydrocephalus, extra-axial collection or mass lesion/mass effect. Encephalomalacia within the right occipital lobe compatible with a remote right PCA distribution infarct. This is new from prior MRI 10/06/2011. Scattered low-density changes within the periventricular and subcortical white matter compatible with chronic microvascular ischemic change. Mild diffuse cerebral volume loss. Vascular: Mild atherosclerotic calcifications involving the large vessels of the skull base. No unexpected hyperdense vessel. Skull: Normal. Negative for fracture or focal lesion. Sinuses/Orbits: Chronic partial opacification of the right maxillary sinus, likely related to a large retention cyst. Other: None. IMPRESSION: 1. No CT evidence of acute intracranial process. 2. Encephalomalacia within the right occipital lobe compatible with a remote right PCA distribution infarct. Electronically Signed   By: Duanne Guess D.O.   On: 02/27/2019 13:24    Procedures Procedures (including critical care time)  Medications Ordered in ED Medications  atorvastatin (LIPITOR) tablet 40 mg (40 mg Oral Given 02/27/19 2114)  senna-docusate (Senokot-S) tablet 1 tablet (has no administration in time range)  enoxaparin (LOVENOX) injection 40 mg (has no administration in time range)  amLODipine (NORVASC) tablet 10 mg (10 mg Oral Given 02/27/19 2114)  pantoprazole (PROTONIX) EC tablet 40 mg (40 mg Oral Given 02/27/19 2114)  LORazepam (ATIVAN) injection 1 mg (1 mg Intravenous Given 02/27/19 1736)    ED Course  I have reviewed the triage vital signs and the nursing notes.  Pertinent labs & imaging results that were available during my care of the  patient were reviewed  by me and considered in my medical decision making (see chart for details).    MDM Rules/Calculators/A&P                      86 y.o. female with possible history of dementia, hypertension who presents for evaluation of altered mental status.  Patient with history of dementia and daughter states that normally she can carry a conversation but is sometimes forgetful.  About 2 days ago, she daughter noticed that patient was confused and was wanting to use the bathroom and kitchen.  She states she was saying words that were not making sense.  On initially during, patient is afebrile, nontoxic-appearing.  Patient appears acutely confused.  She can tell me her name but then when I talk to her, she goes often mumbles into words that do not make sense and do not form sentences.  She can identify her glasses but is unable to identify any other objects.   CBC shows no leukocytosis.  Hemoglobin stable.  CMP shows normal BUN and creatinine.  UA negative for any abnormality.  CT shows no evidence of acute intracranial process.  There is mention of inflammation with right lobe compatible with remote right PCA distribution infarct.  Discussed patient with Dr. Wilford Corner (Neuro). Recommends MRI and he will see at bedside.    RN inform me that patient is having difficulty tolerating MRI.  She was ordered Ativan but was still having difficulty.  MRI had recommended admission for probable stroke.  We will plan for medical admission and plan for MRI when she is more stabilized.  I discussed with internal medicine team.  They will plan to admit.  Portions of this note were generated with Scientist, clinical (histocompatibility and immunogenetics). Dictation errors may occur despite best attempts at proofreading.   Final Clinical Impression(s) / ED Diagnoses Final diagnoses:  Altered mental status, unspecified altered mental status type  Aphasia    Rx / DC Orders ED Discharge Orders    None       Rosana Hoes  02/27/19 2132    Milagros Loll, MD 02/28/19 [phone]

## 2019-02-27 NOTE — ED Notes (Signed)
[phone] annett granddaughter would like an update please as soon as you can .

## 2019-02-27 NOTE — ED Notes (Signed)
MRI unable to get scan done.

## 2019-02-27 NOTE — Consult Note (Signed)
Neurology Consultation  Reason for Consult: word finding issues, inability to do ADLs  Referring Physician: Dr. Stevie Kern  CC: Word finding difficulties, slurred speech and inability to perform activities daily living  History is obtained from: Daughter at bedside, chart  HPI: Jennifer Owen is a 86 y.o. female past medical history of prior right PCA territory stroke which the patient does not know when it happened but was discovered after an eye examination revealed field deficits, arthritis, hypertension, hyperlipidemia, history of PE after knee replacement not on anticoagulation, currently only on an aspirin low-dose, presents for evaluation of 2 days worth of word finding difficulty, confusion, slurred speech and inability to perform her ADLs. At baseline, she has a history of dementia but is able to have a normal conversation.  Over the past 2 days the daughter noted that she is not able to make her needs known.  She was not able to figure out how to use the bathroom which is atypical for her. She has had an old stroke and because her symptoms did not improve, the daughter brought her to the ER for further evaluation. Her last known normal was at least 48 hours ago-unclear of the exact time. In the emergency room, she appeared to be aphasic on examination.  Noncon head CT was done which revealed a remote right PCA territory infarct but no new changes. She denies any chest pain nausea vomiting shortness of breath.  Denies any sick contacts.  Denies cough fever chills.   LKW: 48 hours ago tpa given?: no, outside the window Premorbid modified Rankin scale (mRS):2  ROS: Unable to obtain from the patient due to aphasia and altered mental status-obtained from the daughter and pertinent positives in the HPI.  Rest of the review negative.  Past Medical History:  Diagnosis Date  . GERD (gastroesophageal reflux disease)    Papulous gastropathy EGD Oct. 2007  . Hyperlipemia   . Hypertension    . IBS (irritable bowel syndrome)   . Low back pain    intermittent radiculopathy  . Osteoarthritis   . Osteopenia    T score-1.1 R Fremur, - 0.4 L spine  . Polio 1942  . Pulmonary embolism (HCC)    2/2 knee replacement  . Urinary incontinence     Family History  Problem Relation Age of Onset  . Cancer Daughter        colon caner, <60  . Cancer Mother        lung  . Alzheimer's disease Father   . Heart disease Son    Social History:   reports that she quit smoking about 76 years ago. She has never used smokeless tobacco. She reports that she does not drink alcohol or use drugs.  Medications No current facility-administered medications for this encounter.  Current Outpatient Medications:  .  amLODipine (NORVASC) 10 MG tablet, TAKE 1 TABLET BY MOUTH DAILY., Disp: 90 tablet, Rfl: 1 .  aspirin 81 MG chewable tablet, Chew 81 mg by mouth daily.  , Disp: , Rfl:  .  atorvastatin (LIPITOR) 40 MG tablet, TAKE 1 TABLET BY MOUTH DAILY., Disp: 90 tablet, Rfl: 3 .  diclofenac sodium (VOLTAREN) 1 % GEL, Apply 2 g upto 4 times a day to your knees as needed for knee pain., Disp: 100 g, Rfl: 2 .  esomeprazole (NEXIUM) 40 MG capsule, TAKE 1 CAPSULE BY MOUTH DAILY BEFORE BREAKFAST., Disp: 90 capsule, Rfl: 1 .  hydrochlorothiazide (HYDRODIURIL) 25 MG tablet, TAKE 1 TABLET BY MOUTH DAILY.,  Disp: 60 tablet, Rfl: 11 .  lisinopril (ZESTRIL) 40 MG tablet, , Disp: , Rfl:  .  memantine (NAMENDA) 5 MG tablet, TAKE 1 TABLET BY MOUTH 2 TIMES DAILY., Disp: 180 tablet, Rfl: 1   Exam: Current vital signs: BP 109/87   Pulse 76   Temp 98 F (36.7 C) (Oral)   Resp (!) 21   SpO2 98%  Vital signs in last 24 hours: Temp:  [98 F (36.7 C)] 98 F (36.7 C) (02/17 1159) Pulse Rate:  [76] 76 (02/17 1159) Resp:  [14-21] 21 (02/17 1232) BP: (99-115)/(67-87) 109/87 (02/17 1232) SpO2:  [98 %] 98 % (02/17 1159) GEN: She is awake alert in no distress HEENT: Normocephalic atraumatic Lungs:Clear to  auscultation Cardio vascular: Regular rhythm Abdomen: Soft nondistended nontender Extremities warm well perfused Neurological exam She is awake alert oriented to self. She could not tell me her age or where she is.  She could not tell me the date today. Her speech is mildly dysarthric She is able to follow some simple commands and mimic but has complete inability to name objects or repeat sentences. Following simple commands and mimicking was also intermittent. Poor attention concentration Cranial: Pupils equal round reactive light, extraocular movements appear intact, visual field examination difficult to perform due to her cooperation but there might be a left lower homonymous quadrantanopsia, face appears symmetric, tongue and palate are midline. Motor exam: She is able to lift all 4 extremities antigravity on coaching without any drift. Sensory exam: Intact to touch all over Coordination: No ataxia Gait testing deferred at this time NIH stroke scale 1a Level of Conscious.: 0 1b LOC Questions: 2 1c LOC Commands: 0 2 Best Gaze: 0 3 Visual: 1 4 Facial Palsy: 0 5a Motor Arm - left: 0 5b Motor Arm - Right: 0 6a Motor Leg - Left: 0 6b Motor Leg - Right: 0 7 Limb Ataxia: 0 8 Sensory: 0 9 Best Language: 2 10 Dysarthria: 1 11 Extinct. and Inatten.: 0 TOTAL: 6  Labs I have reviewed labs in epic and the results pertinent to this consultation are:  CBC    Component Value Date/Time   WBC 4.1 02/27/2019 1217   RBC 3.44 (L) 02/27/2019 1217   HGB 12.1 02/27/2019 1217   HGB 12.7 06/22/2015 1401   HCT 37.8 02/27/2019 1217   HCT 38.1 06/22/2015 1401   PLT 224 02/27/2019 1217   PLT 328 06/22/2015 1401   MCV 109.9 (H) 02/27/2019 1217   MCV 101 (H) 06/22/2015 1401   MCH 35.2 (H) 02/27/2019 1217   MCHC 32.0 02/27/2019 1217   RDW 13.4 02/27/2019 1217   RDW 14.2 06/22/2015 1401   LYMPHSABS 1.6 02/27/2019 1217   MONOABS 0.5 02/27/2019 1217   EOSABS 0.1 02/27/2019 1217   BASOSABS  0.0 02/27/2019 1217    CMP     Component Value Date/Time   NA 141 02/27/2019 1217   NA 141 07/24/2017 1445   K 4.3 02/27/2019 1217   CL 110 02/27/2019 1217   CO2 19 (L) 02/27/2019 1217   GLUCOSE 93 02/27/2019 1217   BUN 23 02/27/2019 1217   BUN 18 07/24/2017 1445   CREATININE 1.04 (H) 02/27/2019 1217   CREATININE 0.99 11/20/2013 1032   CALCIUM 10.1 02/27/2019 1217   CALCIUM 10.1 04/10/2007 1429   PROT 6.2 (L) 02/27/2019 1217   ALBUMIN 3.4 (L) 02/27/2019 1217   AST 22 02/27/2019 1217   ALT 22 02/27/2019 1217   ALKPHOS 60 02/27/2019 1217  BILITOT 0.8 02/27/2019 1217   GFRNONAA 49 (L) 02/27/2019 1217   GFRNONAA 54 (L) 11/20/2013 1032   GFRAA 57 (L) 02/27/2019 1217   GFRAA 62 11/20/2013 1032   Imaging I have reviewed the images obtained:  CT-scan of the brain-remote right PCA stroke.  Generalized atrophy.  No acute changes.  No bleed  Assessment:  86 year old past history of prior right PCA territory stroke with what the family says is no residual deficits and the discovery made after an eye examination, arthritis, hypertension hyperlipidemia, history of PE after knee replacement not on any anticoagulation, presented with 2 days history of slurred speech, word finding difficulty confusion and inability to perform her ADLs. At baseline she does have dementia but is able to carry out most of her ADLs independently and is able to carry out a conversation which has not been the case for the past 2 years. On examination, she does exhibit poor attention concentration as well as aphasia. She is outside the window for TPA or intervention with last known normal being at least 48 hours ago. Suspicion for embolic stroke versus toxic metabolic encephalopathy.  Given her risk factors would pursue stroke/TIA structural work-up in addition to encephalopathy work-up  Impression: Evaluate for stroke versus TIA Evaluate for toxic metabolic encephalopathy  Recommendations: -Admit to  observation -Telemetry -MRI brain without contrast -MRA head without contrast -Carotid Dopplers -Frequent neurochecks -2D echo -A1c -Lipid panel -PT OT speech therapy -N.p.o. until cleared by bedside stroke swallow evaluation or formal swallow evaluation -B12, TSH, RPR -UA, CXR -Check ammonia levels -No need for permissive hypertension as she is at least 48 hours ago from her last known normal.  Plan was discussed with the ED provider Graciella Freer PA-C.  Stroke team will follow with you.  -- Milon Dikes, MD [facility] Pager: [phone] If 7pm to 7am, please call on call as listed on AMION.

## 2019-02-27 NOTE — ED Notes (Signed)
Pt to MRI

## 2019-02-27 NOTE — ED Notes (Signed)
Pt still altered, daughter at bedside. Pt attempting without success to crawl out of bed. Pt was able to takePO medications well and tolerate cheese and crackers per MD request.

## 2019-02-27 NOTE — ED Triage Notes (Signed)
Pt bib [facility] from home w/ altered mental status per family. Pt AOx2, w/ hx of dementia. Per EMS family states pt "a little slower to respond than normal." Pt has hx of CVA w/ no defecits. EMS VSS, EMS EKG NSR.

## 2019-02-27 NOTE — Telephone Encounter (Signed)
Agree, concerning for stroke versus TIA, urgent ED eval is best.

## 2019-02-27 NOTE — H&P (Addendum)
Date: 02/27/2019               Patient Name:  Jennifer Owen MRN: [id number]  DOB: [birth year] Age / Sex: 86 y.o., female   PCP: Bridget Hartshorn, DO         Medical Service: Internal Medicine Teaching Service         Attending Physician: Dr. Milagros Loll, MD    First Contact: Ronelle Nigh, MD, Molli Hazard Pager: [phone])  Second Contact: Delma Officer, MD, [place] Pager: Namon Cirri [phone])       After Hours (After 5p/  First Contact Pager: [phone]  weekends / holidays): Second Contact Pager: [phone]   Chief Complaint: AMS  History of Present Illness:   History was obtained via chart review and daughter at the bedside due to patient's inability to participate.   Ms. Decarolis is an 86 y.o female with a history significant for R PCA stroke, dementia, HTN, HLD, GERD, PE after knee replacement not on anticoagulation, polio, and osteoarthritis who presented after having 2 days of difficulty finding her words, confusion, slurred speech and decreased functional status at home. Daughter states she was in her normal state of health a few days ago, but noticed she was "off". At baseline, the patient is able to hold a conversation, ambulate with a cane, and use the bathroom independently. Over the past 2 days the patient has not been able to communicate effectively, and frequently says things that don't make sense. She has been confused as well. She has mistaken the bedroom closet with the bathroom. The patient's appetite has been good, and denies decreased oral intake. She denies any headache, vision changes, chest pain, SOB, cough, changes in her bowel or bladder habits, weakness, or numbness or tingling in any extremities.  In the ED, a CBC, CMP, UA, and ethanol level were obtained.  CBC was notable for macrocytosis, with MCV of 109.9.  BMP was notable for slightly elevated creatinine to 1.04, which is at the patient's baseline.  CT of the head did not show any acute intracranial process.   Neurology saw the patient in the ED and recommended MRI of the brain. The patient received 1 mg of Ativan, but was not able to tolerate the procedure.   Lab Orders     SARS CORONAVIRUS 2 (TAT 6-24 HRS) Nasopharyngeal Nasopharyngeal Swab     Comprehensive metabolic panel     CBC with Differential     Ethanol     Urinalysis, Routine w reflex microscopic   Meds:  Current Meds  Medication Sig  . acetaminophen (TYLENOL) 500 MG tablet Take 500 mg by mouth every 6 (six) hours as needed for mild pain.  Marland Kitchen amLODipine (NORVASC) 10 MG tablet TAKE 1 TABLET BY MOUTH DAILY. (Patient taking differently: Take 10 mg by mouth daily. )  . aspirin 81 MG chewable tablet Chew 81 mg by mouth daily.    Marland Kitchen atorvastatin (LIPITOR) 40 MG tablet TAKE 1 TABLET BY MOUTH DAILY. (Patient taking differently: Take 40 mg by mouth daily. )  . diclofenac sodium (VOLTAREN) 1 % GEL Apply 2 g upto 4 times a day to your knees as needed for knee pain. (Patient taking differently: Apply 2 g topically daily. For knee pain)  . esomeprazole (NEXIUM) 40 MG capsule TAKE 1 CAPSULE BY MOUTH DAILY BEFORE BREAKFAST. (Patient taking differently: Take 40 mg by mouth daily. )  . hydrochlorothiazide (HYDRODIURIL) 25 MG tablet TAKE 1 TABLET BY MOUTH DAILY. (Patient taking differently: Take  25 mg by mouth daily. )  . lisinopril (ZESTRIL) 40 MG tablet Take 40 mg by mouth daily.   . memantine (NAMENDA) 5 MG tablet TAKE 1 TABLET BY MOUTH 2 TIMES DAILY. (Patient taking differently: Take 5 mg by mouth daily. )   Allergies: Allergies as of 02/27/2019 - Review Complete 02/27/2019  Allergen Reaction Noted  . Tramadol hcl Nausea Only    Past Medical History:  Diagnosis Date  . GERD (gastroesophageal reflux disease)    Papulous gastropathy EGD Oct. 2007  . Hyperlipemia   . Hypertension   . IBS (irritable bowel syndrome)   . Low back pain    intermittent radiculopathy  . Osteoarthritis   . Osteopenia    T score-1.1 R [place], - 0.4 L spine  . Polio  1942  . Pulmonary embolism (HCC)    2/2 knee replacement  . Urinary incontinence    Past Surgical History:  Procedure Laterality Date  . ABDOMINAL HYSTERECTOMY  1985  . JOINT REPLACEMENT     total knee - left 1995, right 2004  . OOPHORECTOMY  1985   Family History:  Family History  Problem Relation Age of Onset  . Cancer Daughter        colon caner, <60  . Cancer Mother        lung  . Alzheimer's disease Father   . Heart disease Son     Social History:  Social History   Tobacco Use  . Smoking status: Former Smoker    Quit date: 1945    Years since quitting: 76.1  . Smokeless tobacco: Never Used  Substance Use Topics  . Alcohol use: No    Alcohol/week: 0.0 standard drinks    Comment: Christmas time only  . Drug use: No  - Lives at home with daughter, Manus Gunning and other daughter - Pt able to ambulate with a cane, and use the bathroom indecently. Needs help with other ADLS and IADLS  Review of Systems: A complete ROS was negative except as per HPI.  Imaging: CT Head: IMPRESSION: 1. No CT evidence of acute intracranial process. 2. Encephalomalacia within the right occipital lobe compatible with a remote right PCA distribution infarct.  EKG: personally reviewed my interpretation is sinus rhythm. No signs of acute ischemic process.  CXR:  IMPRESSION: No acute findings  Physical Exam: Blood pressure 132/79, pulse 72, temperature 98 F (36.7 C), temperature source Oral, resp. rate (!) 22, SpO2 100 %.  Physical Exam Vitals reviewed.  Constitutional:      General: She is not in acute distress.    Appearance: Normal appearance. She is not ill-appearing, toxic-appearing or diaphoretic.  HENT:     Head: Normocephalic and atraumatic.     Mouth/Throat:     Mouth: Mucous membranes are moist.     Pharynx: No oropharyngeal exudate or posterior oropharyngeal erythema.  Eyes:     General: No scleral icterus.       Right eye: No discharge.        Left eye: No discharge.      Extraocular Movements: Extraocular movements intact.     Conjunctiva/sclera: Conjunctivae normal.     Pupils: Pupils are equal, round, and reactive to light.  Cardiovascular:     Rate and Rhythm: Normal rate and regular rhythm.     Pulses: Normal pulses.     Heart sounds: Normal heart sounds. No murmur. No friction rub. No gallop.   Pulmonary:     Effort: Pulmonary effort is normal. No  respiratory distress.     Breath sounds: Normal breath sounds. No wheezing or rales.  Abdominal:     General: Abdomen is flat. Bowel sounds are normal. There is no distension.     Palpations: Abdomen is soft.     Tenderness: There is no abdominal tenderness. There is no guarding.  Musculoskeletal:     Right lower leg: No edema.     Left lower leg: No edema.     Comments: Surgical scars on knees  Skin:    General: Skin is warm.  Psychiatric:        Mood and Affect: Mood normal.   Mental Status: Patient is awake, alert, but not oriented. Cannot say where she is or birth year. Intermittently follows commands, but frequently ignores commands. Cranial Nerves: II: Pupils equal, round, and reactive to light.   III,IV, VI: EOMI without ptosis or diploplia.  V: Facial sensation is symmetric to light touch  VII: Facial movement is symmetric VIII: hearing is intact to voice X: Uvula elevates symmetrically XI: Shoulder shrug is symmetric XII: tongue is midline without atrophy or fasciculations Motor: Difficult to assess due to noncompliance. Antigravity in all extremities  Sensory: Sensation is grossly intact in bilateral UEs & LEs Deep Tendon Reflexes: 2+ in bilateral upper and lower extremities, diminished in knees bilaterally  Plantars: Toes are downgoing bilaterally. Cerebellar: Unable to assess due to pt noncompliance  Assessment & Plan by Problem: Active Problems:   Acute encephalopathy  In summary, Ms. Roston is an 86 y.o female with a history of R PCA stroke, dementia, HTN, HLD, GERD, PE  after knee replacement not on anticoagulation who presented after having 2 days of difficulty finding her words, confusion, slurred speech and decreased functional status. Her presentation is concerning for an acute CVA.  #AMS #Suspected CVA: Given the pt's history of CVA in the past, and current presentation of slurred speech, difficulty finding words and confusion are all concerning signs of a new CVA. CT of the head did not show any acute process, but did show evidence of prior PCA infarct.  On exam, the patient appears to be nonfocal, but confused.  The patient received 1 mg of Ativan shortly prior to our examination, its difficult to determine whether or not his confusion is due to medication side effect or something else at this time. -Aspirin 325 mg -Neurology is following, appreciate recommendations.  Secondary stroke work-up underway  -MRI Brain without contrast ordered   -MRA head without contrast ordered  -Carotid dopplers ordered  -Echocardiogram ordered  -f/u Hgb A1c, Lipid panel, B12, TSH, RPR, ammonia  -Telemetry  -PT/OT/SLP  -Strict I's and O's  #HTN #HLD: Patient out of the permissive hypertensive window, given her last known normal was greater than 48 hours ago.  Patient takes amlodipine and atorvastatin at home. -Amlodipine 10 mg daily -Atorvastatin 40 mg daily  #GERD -Protonix 40 mg daily  #Macrocytosis: 109.9 on intial CBC, which is a new finding compared to previous labs. - B12 pending   #FEN/GI -Diet: NPO pending stroke swallow screen -Fluids: None -Senokot tablet daily as needed  #DVT prophylaxis -Lovenox 40 mg subq injections daily  #CODE STATUS: DNR, confirmed with POA - daughter Madisson Kulaga [phone]  #Dispo: Admit patient to Observation with expected length of stay less than 2 midnights.  Signed: Kirt Boys, MD Internal Medicine, PGY1 Pager: [phone]  02/27/2019,8:41 PM

## 2019-02-28 ENCOUNTER — Other Ambulatory Visit: Payer: Self-pay

## 2019-02-28 ENCOUNTER — Other Ambulatory Visit (HOSPITAL_COMMUNITY): Payer: Self-pay

## 2019-02-28 ENCOUNTER — Inpatient Hospital Stay (HOSPITAL_COMMUNITY): Payer: Medicare Other

## 2019-02-28 DIAGNOSIS — I10 Essential (primary) hypertension: Secondary | ICD-10-CM

## 2019-02-28 DIAGNOSIS — Z8673 Personal history of transient ischemic attack (TIA), and cerebral infarction without residual deficits: Secondary | ICD-10-CM

## 2019-02-28 DIAGNOSIS — R4781 Slurred speech: Secondary | ICD-10-CM

## 2019-02-28 DIAGNOSIS — R4182 Altered mental status, unspecified: Secondary | ICD-10-CM

## 2019-02-28 DIAGNOSIS — D7589 Other specified diseases of blood and blood-forming organs: Secondary | ICD-10-CM

## 2019-02-28 DIAGNOSIS — F0390 Unspecified dementia without behavioral disturbance: Secondary | ICD-10-CM

## 2019-02-28 DIAGNOSIS — I6389 Other cerebral infarction: Secondary | ICD-10-CM

## 2019-02-28 DIAGNOSIS — Z96659 Presence of unspecified artificial knee joint: Secondary | ICD-10-CM

## 2019-02-28 DIAGNOSIS — Z86711 Personal history of pulmonary embolism: Secondary | ICD-10-CM

## 2019-02-28 DIAGNOSIS — E785 Hyperlipidemia, unspecified: Secondary | ICD-10-CM

## 2019-02-28 DIAGNOSIS — K219 Gastro-esophageal reflux disease without esophagitis: Secondary | ICD-10-CM

## 2019-02-28 DIAGNOSIS — Z79899 Other long term (current) drug therapy: Secondary | ICD-10-CM

## 2019-02-28 DIAGNOSIS — G9340 Encephalopathy, unspecified: Secondary | ICD-10-CM

## 2019-02-28 LAB — POCT I-STAT EG7
Acid-base deficit: 1 mmol/L (ref 0.0–2.0)
Bicarbonate: 23.6 mmol/L (ref 20.0–28.0)
Calcium, Ion: 1.36 mmol/L (ref 1.15–1.40)
HCT: 35 % — ABNORMAL LOW (ref 36.0–46.0)
Hemoglobin: 11.9 g/dL — ABNORMAL LOW (ref 12.0–15.0)
O2 Saturation: 93 %
Potassium: 3.6 mmol/L (ref 3.5–5.1)
Sodium: 141 mmol/L (ref 135–145)
TCO2: 25 mmol/L (ref 22–32)
pCO2, Ven: 36.6 mmHg — ABNORMAL LOW (ref 44.0–60.0)
pH, Ven: 7.418 (ref 7.250–7.430)
pO2, Ven: 64 mmHg — ABNORMAL HIGH (ref 32.0–45.0)

## 2019-02-28 LAB — COMPREHENSIVE METABOLIC PANEL
ALT: 18 U/L (ref 0–44)
AST: 17 U/L (ref 15–41)
Albumin: 3.3 g/dL — ABNORMAL LOW (ref 3.5–5.0)
Alkaline Phosphatase: 54 U/L (ref 38–126)
Anion gap: 10 (ref 5–15)
BUN: 23 mg/dL (ref 8–23)
CO2: 23 mmol/L (ref 22–32)
Calcium: 10.1 mg/dL (ref 8.9–10.3)
Chloride: 109 mmol/L (ref 98–111)
Creatinine, Ser: 0.85 mg/dL (ref 0.44–1.00)
GFR calc Af Amer: >60 mL/min (ref >60)
GFR calc non Af Amer: >60 mL/min (ref >60)
Glucose, Bld: 89 mg/dL (ref 70–99)
Potassium: 3.6 mmol/L (ref 3.5–5.1)
Sodium: 142 mmol/L (ref 135–145)
Total Bilirubin: 0.8 mg/dL (ref 0.3–1.2)
Total Protein: 6 g/dL — ABNORMAL LOW (ref 6.5–8.1)

## 2019-02-28 LAB — SARS CORONAVIRUS 2 (TAT 6-24 HRS): SARS Coronavirus 2: NEGATIVE

## 2019-02-28 LAB — T4, FREE: Free T4: 1.41 ng/dL — ABNORMAL HIGH (ref 0.61–1.12)

## 2019-02-28 LAB — ECHOCARDIOGRAM COMPLETE

## 2019-02-28 LAB — RPR: RPR Ser Ql: NONREACTIVE

## 2019-02-28 MED ORDER — HALOPERIDOL LACTATE 5 MG/ML IJ SOLN
2.0000 mg | Freq: Once | INTRAMUSCULAR | Status: AC | PRN
  Administered 2019-03-01: 06:00:00 2 mg via INTRAVENOUS
  Filled 2019-02-28: qty 1

## 2019-02-28 MED ORDER — HALOPERIDOL LACTATE 5 MG/ML IJ SOLN
2.0000 mg | Freq: Four times a day (QID) | INTRAMUSCULAR | Status: DC | PRN
  Administered 2019-02-28: 16:00:00 2 mg via INTRAVENOUS
  Filled 2019-02-28: qty 1

## 2019-02-28 MED ORDER — MEMANTINE HCL 10 MG PO TABS
5.0000 mg | ORAL_TABLET | Freq: Every day | ORAL | Status: DC
  Administered 2019-02-28 – 2019-03-06 (×7): 5 mg via ORAL
  Filled 2019-02-28 (×7): qty 1

## 2019-02-28 MED ORDER — MAGNESIUM SULFATE 4 GM/100ML IV SOLN
4.0000 g | Freq: Once | INTRAVENOUS | Status: AC
  Administered 2019-02-28: 04:00:00 4 g via INTRAVENOUS
  Filled 2019-02-28: qty 100

## 2019-02-28 NOTE — Progress Notes (Signed)
STROKE TEAM PROGRESS NOTE   INTERVAL HISTORY I personally reviewed history of presenting illness with the patient and daughter, electronic medical records and imaging films in PACS.  She has history of baseline dementia and presented with increasing confusion and disorientation for a few days.  CT scan of the head shows an old right occipital infarct with no acute abnormality.  Vitals:   02/28/19 0423 02/28/19 0501 02/28/19 0600 02/28/19 0700  BP: 99/82 (!) 139/120 (!) 133/116 132/69  Pulse: 81 86 72   Resp: 20 (!) 31 20 (!) 21  Temp:      TempSrc:      SpO2: 97% 97% 96%     CBC:  Recent Labs  Lab 02/27/19 1217 02/28/19 0007  WBC 4.1  --   NEUTROABS 1.9  --   HGB 12.1 11.9*  HCT 37.8 35.0*  MCV 109.9*  --   PLT 224  --     Basic Metabolic Panel:  Recent Labs  Lab 02/27/19 1217 02/27/19 1217 02/27/19 2011 02/28/19 0007 02/28/19 0349  NA 141   < >  --  141 142  K 4.3   < >  --  3.6 3.6  CL 110  --   --   --  109  CO2 19*  --   --   --  23  GLUCOSE 93  --   --   --  89  BUN 23  --   --   --  23  CREATININE 1.04*  --   --   --  0.85  CALCIUM 10.1  --   --   --  10.1  MG  --   --  1.0*  --   --    < > = values in this interval not displayed.   Lipid Panel:     Component Value Date/Time   CHOL 157 02/27/2019 2011   CHOL 169 10/03/2016 1422   TRIG 65 02/27/2019 2011   HDL 98 02/27/2019 2011   HDL 116 10/03/2016 1422   CHOLHDL 1.6 02/27/2019 2011   VLDL 13 02/27/2019 2011   LDLCALC 46 02/27/2019 2011   LDLCALC 39 10/03/2016 1422   HgbA1c:  Lab Results  Component Value Date   HGBA1C 5.2 02/27/2019   Urine Drug Screen:     Component Value Date/Time   LABOPIA NEG 07/25/2012 1631   COCAINSCRNUR NEG 07/25/2012 1631   LABBENZ NEG 07/25/2012 1631   AMPHETMU NEG 07/25/2012 1631   THCU NEG 07/25/2012 1631   LABBARB NEG 07/25/2012 1631    Alcohol Level     Component Value Date/Time   ETH <10 02/27/2019 1217    IMAGING past 48 hours DG Chest 2  View  Result Date: 02/27/2019 CLINICAL DATA:  Shortness of breath, altered mental status. EXAM: CHEST - 2 VIEW COMPARISON:  04/19/2008. FINDINGS: Patient is slightly rotated. Trachea is midline. Heart size is accentuated by low lung volumes. Minimal basilar linear scarring. Lungs are otherwise clear. No pleural fluid. IMPRESSION: No acute findings. Electronically Signed   By: Leanna Battles M.D.   On: 02/27/2019 14:42   CT Head Wo Contrast  Result Date: 02/27/2019 CLINICAL DATA:  Altered mental status EXAM: CT HEAD WITHOUT CONTRAST TECHNIQUE: Contiguous axial images were obtained from the base of the skull through the vertex without intravenous contrast. COMPARISON:  10/06/2011 FINDINGS: Brain: No evidence of acute infarction, hemorrhage, hydrocephalus, extra-axial collection or mass lesion/mass effect. Encephalomalacia within the right occipital lobe compatible with a remote right  PCA distribution infarct. This is new from prior MRI 10/06/2011. Scattered low-density changes within the periventricular and subcortical white matter compatible with chronic microvascular ischemic change. Mild diffuse cerebral volume loss. Vascular: Mild atherosclerotic calcifications involving the large vessels of the skull base. No unexpected hyperdense vessel. Skull: Normal. Negative for fracture or focal lesion. Sinuses/Orbits: Chronic partial opacification of the right maxillary sinus, likely related to a large retention cyst. Other: None. IMPRESSION: 1. No CT evidence of acute intracranial process. 2. Encephalomalacia within the right occipital lobe compatible with a remote right PCA distribution infarct. Electronically Signed   By: Duanne Guess D.O.   On: 02/27/2019 13:24    PHYSICAL EXAM Frail elderly African-American lady not in distress. . Afebrile. Head is nontraumatic. Neck is supple without bruit.    Cardiac exam no murmur or gallop. Lungs are clear to auscultation. Distal pulses are well felt. Neurological  Exam ;  Awake  Alert oriented x 1.  Diminished attention, registration and recall.  Poor insight into her illness.  Hesitant speech and impaired comprehension naming and repetition.Marland [place] movements full without nystagmus.fundi were not visualized.  Dense left homonymous hemianopsia.  Hearing is normal. Palatal movements are normal. Face symmetric. Tongue midline. Normal strength, tone, reflexes and coordination. Normal sensation. Gait deferred.  ASSESSMENT/PLAN Jennifer Owen is a 86 y.o. female with history of R PCA territory stroke discovered after an eye examination revealed field deficits, arthritis, hypertension, hyperlipidemia, PE after knee replacement not on anticoagulation currently on low-dose aspirin, presenting with 2 day hx of word finding difficulty, confusion, slurred speech and inability to perform her ADLs..   Increasing confusion disorientation and the patient with baseline dementia and old stroke unclear whether this is a new stroke.  MRI scan is pending  CT head No acute abnormality. Old R PCA infarct  MRI pending  MRA pending  Carotid Doppler pending  2D Echo normal ejection fraction 60 to 65%.  LDL 46  HgbA1c 5.2  Lovenoc for VTE prophylaxis  aspirin 81 mg daily prior to admission, now on aspirin 81 mg daily.    Therapy recommendations: pending  Disposition:  pending  Hypertension  Stable . Permissive hypertension (OK if < 220/120) but gradually normalize in 5-7 days . Long-term BP goal normotensive  Hyperlipidemia  Home meds:  lipitor 40, resumed in hospital  LDL 46, goal < 70  Continue statin at discharge  Diabetes    Not known to be one  Home meds:  none  HgbA1c 5.2, goal < 7.0  CBGs  No results for input(s): GLUCAP in the last 72 hours.   Other Stroke Risk Factors  Advanced age  Former Cigarette smoker, quit 76 yrs ago  Obesity, There is no height or weight on file to calculate BMI., recommend weight loss, diet and exercise  as appropriate   Hx stroke/TIA  Old- R PCA territory infarct   Hx PE following knee replacement  Other Active Problems  Baseline dementia on namenda  History of polio in 1942 at the age of 14 years  TSH 0.016 - needs correction by primary team   Magnesium 1.0  Hospital day # 1 I have personally obtained history,examined this patient, reviewed notes, independently viewed imaging studies, participated in medical decision making and plan of care.ROS completed by me personally and pertinent positives fully documented  I have made any additions or clarifications directly to the above note.  Patient has baseline dementia and presented with increased confusion disorientation for a few days.  Neurological  exam shows left-sided homonymous hemianopsia which is likely from an old stroke.  It is unclear if she has had new one or not.  CT is unremarkable but MRI scan is pending.  Continue ongoing work-up.  I had a long discussion with the patient and daughter at the bedside and answered questions.  Continue aspirin for now.  Thyroid replacement as per primary team.  Greater than 50% time during this 35-minute visit was spent on counseling and coordination of care and discussion with care team. Antony Contras, MD Medical Director [facility] Pager: [phone] 02/28/2019 3:25 PM   To contact Stroke Continuity provider, please refer to [email or web address] After hours, contact General Neurology

## 2019-02-28 NOTE — Progress Notes (Signed)
Subjective:  Patient seen at bedside. Patient states she is in the hospital but does not remember what hospital. Patient is upset that she is confused. Daughter at bedside states patient still recognizes her. At baseline, patient can hold conversations and is not significantly confused.  Objective:    Vital Signs (last 24 hours): Vitals:   02/28/19 0700 02/28/19 0855 02/28/19 0900 02/28/19 1000  BP: 132/69 (!) 149/69 (!) 152/64 (!) 147/70  Pulse:   86 79  Resp: (!) 21 (!) 23 (!) 23 18  Temp:    97.8 F (36.6 C)  TempSrc:    Oral  SpO2:   97% 100%   Physical Exam: General Opens eyes, responds to questions. Patient tearful  Cardiac Regular rate and rhythm, no murmurs, rubs, or gallops  Neurology Neurologic exam limited by patient difficulty following commands. No cranial nerve deficit. Strength 5/5 in bilateral upper extremities. 5/5 bilateral lower extremity strength except for bilateral hamstrings which are 4/5. No focal deficits  Extremities No peripheral edema   CBC Latest Ref Rng & Units 02/28/2019 02/27/2019 06/22/2015  WBC 4.0 - 10.5 K/uL - 4.1 5.7  Hemoglobin 12.0 - 15.0 g/dL 11.9(L) 12.1 12.7  Hematocrit 36.0 - 46.0 % 35.0(L) 37.8 38.1  Platelets 150 - 400 K/uL - 224 328   BMP Latest Ref Rng & Units 02/28/2019 02/28/2019 02/27/2019  Glucose 70 - 99 mg/dL 89 - 93  BUN 8 - 23 mg/dL 23 - 23  Creatinine 0.44 - 1.00 mg/dL 0.85 - 1.04(H)  BUN/Creat Ratio 12 - 28 - - -  Sodium 135 - 145 mmol/L 142 141 141  Potassium 3.5 - 5.1 mmol/L 3.6 3.6 4.3  Chloride 98 - 111 mmol/L 109 - 110  CO2 22 - 32 mmol/L 23 - 19(L)  Calcium 8.9 - 10.3 mg/dL 10.1 - 10.1   Lipid Panel     Component Value Date/Time   CHOL 157 02/27/2019 2011   CHOL 169 10/03/2016 1422   TRIG 65 02/27/2019 2011   HDL 98 02/27/2019 2011   HDL 116 10/03/2016 1422   CHOLHDL 1.6 02/27/2019 2011   VLDL 13 02/27/2019 2011   [place] 46 02/27/2019 2011   [place] 39 10/03/2016 1422   LABVLDL 14 10/03/2016 1422     Assessment/Plan:   Active Problems:   Acute encephalopathy  Patient is an 86 year old female with past medical history significant for right PCA stroke, dementia, hypertension, hyperlipidemia, GERD, PE after knee replacement not on anticoagulation who presented after having 2 days of difficulty finding her words, confusion, slurred speech and decreased functional status.  # Altered Mental Status # Suspected CVA CT head without acute process. Today, patient has improvement in mental status per daughter but still confused, on exam symmetric bilateral weakness in hamstrings, but no focal deficits.  Presentation is not typical but remains concerning for an acute CVA. Patient is outside window for TPA. Patient unable to tolerate MRI yesterday, will re-attempt when mental status improves * Aspirin 325 mg given, continue aspirin 81 mg daily * Continue home atorvastatin 40 mg daily * MR brain, MRA head ordered * Vascular ultrasound scheduled * No embolic source visualized on TTE * Hemoglobin A1C of 5.2, LDL of 46, B12 WNL, ammonia WNL, RPR pending * Continue telemetry * PT/OT/SLP ordered * Neurology consulted, we appreciate their recommendations  # Low TSH, Elevated Free T4: Patient with TSH of 0.016, T4 of 1.41. Symptoms could be secondary to thyrotoxicosis but patient without tachycardia, tremor, lid lag. * Will check thyroid  ultrasound  * If nodular, will check RAIU. Without nodules, will consider Graves workup checking thyrotropin-receptor antibody  # HTN: Continue home amlodipine 10 mg daily. Outside window for permissive hypertension  # HLD: Continue home atorvastatin 40 mg daily  # Macrocytosis: Hemoglobin 12.1, near lower-limit normal with MCV 109.9. Unclear etiology. Vitamin B12 WNL at 414.  * MMA level pending  PT/OT: Consulted Diet: Regular DVT Ppx: Lovenox 40 mg daily Dispo: Anticipated discharge pending further medical workup  Katherine Roan, MD 02/28/2019, 11:50  AM

## 2019-02-28 NOTE — Progress Notes (Signed)
Echocardiogram 2D Echocardiogram has been performed.  Omega Slager F Keidrick Murty 02/28/2019, 9:00 AM 

## 2019-02-28 NOTE — ED Notes (Signed)
Tele   Breakfast ordered  

## 2019-02-28 NOTE — Progress Notes (Signed)
EEG complete - results pending 

## 2019-02-28 NOTE — Plan of Care (Signed)

## 2019-02-28 NOTE — Progress Notes (Signed)
Carotid artery duplex exam completed.  Preliminary results can be found under CV proc under chart review.  02/28/2019 4:14 PM  Tyniya Kuyper, K., RDMS, RVT

## 2019-02-28 NOTE — ED Notes (Signed)
Pt removed from bedpan, and pt neither voided nor defecated. Pt's brief changed and peri care performed. Brief had urine in it. Pt repositioned and provided 2 warm blankets.

## 2019-02-28 NOTE — Evaluation (Signed)
Physical Therapy Evaluation Patient Details Name: Jennifer Owen MRN: [id number] DOB: [birth year] Today's Date: 02/28/2019   History of Present Illness  Pt is a 86 y.o. F with significant PMH of R PCA stroke, hypertension, PE after knee replacement who presents with 2 day history of word finding difficulty, confusion, slurred speech and inability to perform ADL's. CT with no acute abnormality. MRI pending.   Clinical Impression  Prior to admission, pt lives with her daughter, requires assist for some ADL's, and is independent with mobility using a cane. Currently, pt with decreased functional mobility secondary to weakness, balance impairments, and significant cognitive deficits. On PT evaluation, pt significantly impulsive and restless, constantly pulling at telemetry leads, IV site, etc. Performing multiple transfers with grossly moderate assist; noted bilateral knee instability. Pt with bowel/urine incontinence, requiring NT assist with peri care and linen change. Given change in functional status, would benefit from CIR to maximize functional mobility and decrease caregiver burden.     Follow Up Recommendations CIR;Supervision/Assistance - 24 hour    Equipment Recommendations  Other (comment)(TBA)    Recommendations for Other Services Rehab consult     Precautions / Restrictions Precautions Precautions: Fall;Other (comment) Precaution Comments: Restless Restrictions Weight Bearing Restrictions: No      Mobility  Bed Mobility Overal bed mobility: Needs Assistance Bed Mobility: Supine to Sit;Sit to Supine     Supine to sit: Min guard Sit to supine: Min guard   General bed mobility comments: Min guard for safety, no physical assist  Transfers Overall transfer level: Needs assistance Equipment used: Rolling walker (2 wheeled);None Transfers: Sit to/from [facility] Sit to Stand: Mod assist;Min assist Stand pivot transfers: Mod assist;+2 safety/equipment        General transfer comment: Pt initially standing from edge of bed with modA to walker, noted bilateral knee instability, experienced bowel/urine incontinence and sat back down. Pt able to perform stand pivot over to recliner with modA and handheld assist and multiple stands at sink to perform peri care and wash up.   Ambulation/Gait                Stairs            Wheelchair Mobility    Modified Rankin (Stroke Patients Only) Modified Rankin (Stroke Patients Only) Pre-Morbid Rankin Score: Moderate disability Modified Rankin: Moderately severe disability     Balance Overall balance assessment: Needs assistance Sitting-balance support: Feet supported Sitting balance-Leahy Scale: Fair     Standing balance support: No upper extremity supported;During functional activity Standing balance-Leahy Scale: Poor Standing balance comment: Unable to maintain standing without external support                             Pertinent Vitals/Pain Pain Assessment: Faces Faces Pain Scale: No hurt    Home Living Family/patient expects to be discharged to:: Private residence Living Arrangements: Children(daughter) Available Help at Discharge: Family;Available 24 hours/day           Home Equipment: Cane - single point      Prior Function Level of Independence: Needs assistance   Gait / Transfers Assistance Needed: uses cane for ambulation  ADL's / Homemaking Assistance Needed: requires assist for dressing, IADL's, typically independent bathing        Hand Dominance        Extremity/Trunk Assessment   Upper Extremity Assessment Upper Extremity Assessment: Defer to OT evaluation    Lower Extremity Assessment Lower Extremity  Assessment: Overall WFL for tasks assessed;Difficult to assess due to impaired cognition    Cervical / Trunk Assessment Cervical / Trunk Assessment: Kyphotic  Communication   Communication: No difficulties  Cognition  Arousal/Alertness: Awake/alert Behavior During Therapy: Restless;Impulsive Overall Cognitive Status: History of cognitive impairments - at baseline Area of Impairment: Orientation;Attention;Following commands;Safety/judgement;Awareness                 Orientation Level: Disoriented to;Place;Time;Situation Current Attention Level: Focused   Following Commands: Follows one step commands inconsistently Safety/Judgement: Decreased awareness of safety;Decreased awareness of deficits Awareness: Intellectual   General Comments: Pt oriented to self only. Extremely restless and impulsive, constantly fidgeting/pulling off objects such as gown, blanket, telemetry leads. Will follow 1 step commands but highly distractable.       General Comments      Exercises     Assessment/Plan    PT Assessment Patient needs continued PT services  PT Problem List Decreased strength;Decreased activity tolerance;Decreased balance;Decreased mobility;Decreased cognition;Decreased safety awareness       PT Treatment Interventions DME instruction;Gait training;Functional mobility training;Therapeutic activities;Therapeutic exercise;Balance training;Patient/family education    PT Goals (Current goals can be found in the Care Plan section)  Acute Rehab PT Goals Patient Stated Goal: pt daughter would like her cognition to improve PT Goal Formulation: With patient/family Time For Goal Achievement: 03/14/19 Potential to Achieve Goals: Fair    Frequency Min 3X/week   Barriers to discharge        Co-evaluation               AM-PAC PT "6 Clicks" Mobility  Outcome Measure Help needed turning from your back to your side while in a flat bed without using bedrails?: A Little Help needed moving from lying on your back to sitting on the side of a flat bed without using bedrails?: A Little Help needed moving to and from a bed to a chair (including a wheelchair)?: A Lot Help needed standing up from a  chair using your arms (e.g., wheelchair or bedside chair)?: A Lot Help needed to walk in hospital room?: A Lot Help needed climbing 3-5 steps with a railing? : Total 6 Click Score: 13    End of Session Equipment Utilized During Treatment: Gait belt Activity Tolerance: Other (comment)(limited by impulsivity/restlessness) Patient left: in bed;with call bell/phone within reach;with bed alarm set Nurse Communication: Mobility status PT Visit Diagnosis: Unsteadiness on feet (R26.81);Difficulty in walking, not elsewhere classified (R26.2)    Time: 6789-3810 PT Time Calculation (min) (ACUTE ONLY): 45 min   Charges:   PT Evaluation $PT Eval Moderate Complexity: 1 Mod PT Treatments $Therapeutic Activity: 23-37 mins          Lillia Pauls, PT, DPT Acute Rehabilitation Services Pager [phone] Office [phone]   Norval Morton 02/28/2019, 4:27 PM

## 2019-03-01 ENCOUNTER — Inpatient Hospital Stay (HOSPITAL_COMMUNITY): Payer: Medicare Other

## 2019-03-01 DIAGNOSIS — E0590 Thyrotoxicosis, unspecified without thyrotoxic crisis or storm: Secondary | ICD-10-CM

## 2019-03-01 LAB — CBC
HCT: 34.9 % — ABNORMAL LOW (ref 36.0–46.0)
Hemoglobin: 11.6 g/dL — ABNORMAL LOW (ref 12.0–15.0)
MCH: 34.7 pg — ABNORMAL HIGH (ref 26.0–34.0)
MCHC: 33.2 g/dL (ref 30.0–36.0)
MCV: 104.5 fL — ABNORMAL HIGH (ref 80.0–100.0)
Platelets: 211 10*3/uL (ref 150–400)
RBC: 3.34 MIL/uL — ABNORMAL LOW (ref 3.87–5.11)
RDW: 13 % (ref 11.5–15.5)
WBC: 4.9 10*3/uL (ref 4.0–10.5)
nRBC: 0 % (ref 0.0–0.2)

## 2019-03-01 LAB — BASIC METABOLIC PANEL
Anion gap: 9 (ref 5–15)
BUN: 19 mg/dL (ref 8–23)
CO2: 24 mmol/L (ref 22–32)
Calcium: 10.2 mg/dL (ref 8.9–10.3)
Chloride: 109 mmol/L (ref 98–111)
Creatinine, Ser: 0.87 mg/dL (ref 0.44–1.00)
GFR calc Af Amer: >60 mL/min (ref >60)
GFR calc non Af Amer: >60 mL/min (ref >60)
Glucose, Bld: 94 mg/dL (ref 70–99)
Potassium: 4.3 mmol/L (ref 3.5–5.1)
Sodium: 142 mmol/L (ref 135–145)

## 2019-03-01 LAB — MAGNESIUM: Magnesium: 1.5 mg/dL — ABNORMAL LOW (ref 1.7–2.4)

## 2019-03-01 MED ORDER — QUETIAPINE FUMARATE 25 MG PO TABS
12.5000 mg | ORAL_TABLET | Freq: Every day | ORAL | Status: DC
  Administered 2019-03-01 – 2019-03-04 (×4): 12.5 mg via ORAL
  Filled 2019-03-01 (×4): qty 1

## 2019-03-01 MED ORDER — MAGNESIUM SULFATE 4 GM/100ML IV SOLN
4.0000 g | Freq: Once | INTRAVENOUS | Status: DC
  Filled 2019-03-01: qty 100

## 2019-03-01 NOTE — Progress Notes (Signed)
Rehab Admissions Coordinator Note:  Patient was screened by Stephania Fragmin for appropriateness for an Inpatient Acute Rehab Consult.  Imaging negative for acute infarct.  Pt does not have a qualifying diagnosis at this time. Recommend f/u in a lower level of care.   Stephania Fragmin 03/01/2019, 12:52 PM  I can be reached at [phone].

## 2019-03-01 NOTE — Progress Notes (Signed)
Pt not cooperating enough to be able to start IV magnesium without some type of restraint. [place] (IM) informed and V.O. "to wait until after pt gets MRI and if does not get this said dose, pt's magnesium will be rechecked in the morning". Will conti to reassess the cooperation of the pt for IV infusion.

## 2019-03-01 NOTE — Progress Notes (Addendum)
Subjective:  Patient seen at bedside. Patient resting comfortably in chair. Patient responds to questions with minimal words.   Objective:    Vital Signs (last 24 hours): Vitals:   02/28/19 1710 02/28/19 1955 02/28/19 2336 03/01/19 0343  BP: 104/85 (!) 130/96 98/78 137/70  Pulse: 78 79 83 80  Resp: 19 17 20 20   Temp: 98.5 F (36.9 C) 98.5 F (36.9 C) 98.1 F (36.7 C) 97.9 F (36.6 C)  TempSrc: Axillary Oral  Oral  SpO2: 99% 100%  97%  Weight:      Height:        Physical Exam: General Responds to voice, no acute distress  Cardiac Regular rate and rhythm, no murmurs, rubs, or gallops  Pulmonary Clear to auscultation bilaterally without wheezes, rhonchi, or rales  Extremities No peripheral edema    TTE (02/28/19): IMPRESSIONS  1. Left ventricular ejection fraction, by estimation, is 60 to 65%. The  left ventricle has normal function. The left ventricle has no regional  wall motion abnormalities. Left ventricular diastolic parameters are  consistent with Grade I diastolic  dysfunction (impaired relaxation).  2. Right ventricular systolic function is normal. The right ventricular  size is normal. There is mildly elevated pulmonary artery systolic  pressure. The estimated right ventricular systolic pressure is 78.2 mmHg.  3. The mitral valve is normal in structure and function. Trivial mitral  valve regurgitation.  4. The aortic valve is normal in structure and function. Aortic valve  regurgitation is not visualized. No aortic stenosis is present.  5. The inferior vena cava is normal in size with greater than 50%  respiratory variability, suggesting right atrial pressure of 3 mmHg.   VA US Carotid Duplex Bilateral - Prelim (02/28/19): Summary:  Right Carotid: Velocities in the right ICA are consistent with a 1-39%  stenosis.   Left Carotid: Velocities in the left ICA are consistent with a 1-39%  stenosis.   Vertebrals: Bilateral vertebral arteries demonstrate  antegrade flow.  Subclavians: Normal flow hemodynamics were seen in bilateral subclavian        arteries.   CBC Latest Ref Rng & Units 03/01/2019 02/28/2019 02/27/2019  WBC 4.0 - 10.5 K/uL 4.9 - 4.1  Hemoglobin 12.0 - 15.0 g/dL 11.6(L) 11.9(L) 12.1  Hematocrit 36.0 - 46.0 % 34.9(L) 35.0(L) 37.8  Platelets 150 - 400 K/uL 211 - 224   BMP Latest Ref Rng & Units 03/01/2019 02/28/2019 02/28/2019  Glucose 70 - 99 mg/dL 94 89 -  BUN 8 - 23 mg/dL 19 23 -  Creatinine 0.44 - 1.00 mg/dL 0.87 0.85 -  BUN/Creat Ratio 12 - 28 - - -  Sodium 135 - 145 mmol/L 142 142 141  Potassium 3.5 - 5.1 mmol/L 4.3 3.6 3.6  Chloride 98 - 111 mmol/L 109 109 -  CO2 22 - 32 mmol/L 24 23 -  Calcium 8.9 - 10.3 mg/dL 10.2 10.1 -    Assessment/Plan:   Principal Problem:   Acute encephalopathy Active Problems:   History of CVA (cerebrovascular accident)  Patient is an 86 year old female with past medical history significant for right PCA stroke, dementia, hypertension, hyperlipidemia, GERD, PE after knee replacement not on anticoagulation who presented after having 2 days of difficulty finding words, confusion, slurred speech and decreased functional status.  # Altered mental status # Suspected CVA CT head without acute process.  Patient continues with mild improvement in mental status, but still confused on exam. Presentation was concerning for acute CVA but MR was without evidence for acute infarct.  MR was limited by motion degredation *Continue aspirin 81 mg daily *Continue atorvastatin 40 mg daily *MR brain, MRI head pending - motion degraded, without acute infarct *Vascular ultrasound prelim report reveals 1-39% stenosis in left and right ICA *No embolic source visualized on TTE *Hemoglobin A1C of 5.2, LDL of 46, B12 WNL, ammonia WNL, RPR negative *Continue telemetry *PT/OT recommend CIR. Patient not candidate per CIR *Neurology consulted, we appreciate their continued recommendations *Agitation - Haldol  2 mg given x2 in past 24 hours  # Low TSH, Elevated Free T4: Patient with low TSH and high T4.  Will check thyroid ultrasound and then RAIU if nodular, thyrotropin receptor antibody if not nodular.  # HTN: Continue home amlodipine 10 mg daily  # HLD: Continue home atorvastatin 40 mg daily  # Macrocytosis: Hemoglobin stable and near lower limit with high MCV.  Unclear etiology.  Vitamin B12 within normal limits, MMA level pending  PT/OT: PT recommends CIR Diet: Regular DVT Ppx: Lovenox 40 mg daily Admit Status: Inpatient Dispo: Anticipated discharge in 1-2 days  Katherine Roan, MD 03/01/2019, 6:31 AM

## 2019-03-01 NOTE — TOC Initial Note (Signed)
Transition of Care [facility]) - Initial/Assessment Note    Patient Details  Name: Jennifer Owen MRN: [id number] Date of Birth: [birth year]  [facility] CM/SW Contact:    Geralynn Ochs, LCSW Phone Number: 03/01/2019, 5:32 PM  Clinical Narrative:    CSW met with patient and daughter at the bedside to discuss discharge planning. CSW explained current recommendation for CIR, but that patient may not qualify. Patient's daughter would be agreeable to CIR if able, but otherwise would like to take the patient home. CSW offered choice, and patient's daughter has no preference for home health provider. CSW asked about DME and patient usually uses a cane at home and has a shower bench. At this time, awaiting results of CIR consult prior to finalizing plans for patient to return home when stable. CSW to follow.   Expected Discharge Plan: [place] Barriers to Discharge: Continued Medical Work up   Patient Goals and CMS Choice Patient states their goals for this hospitalization and ongoing recovery are:: patient unable to participate in goal setting at this time CMS Medicare.gov Compare Post Acute Care list provided to:: Patient Represenative (must comment) Choice offered to / list presented to : Adult Children  Expected Discharge Plan and Services Expected Discharge Plan: [place] Acute Care Choice: Home Health, Durable Medical Equipment Living arrangements for the past 2 months: Single Family Home                                      Prior Living Arrangements/Services Living arrangements for the past 2 months: Single Family Home Lives with:: Adult Children Patient language and need for interpreter reviewed:: No Do you feel safe going back to the place where you live?: Yes      Need for Family Participation in Patient Care: Yes (Comment) Care giver support system in place?: Yes (comment) Current home services:  DME Criminal Activity/Legal Involvement Pertinent to Current Situation/Hospitalization: No - Comment as needed  Activities of Daily Living Home Assistive Devices/Equipment: Cane (specify quad or straight), Eyeglasses ADL Screening (condition at time of admission) Patient's cognitive ability adequate to safely complete daily activities?: No Is the patient deaf or have difficulty hearing?: Yes Does the patient have difficulty seeing, even when wearing glasses/contacts?: No Does the patient have difficulty concentrating, remembering, or making decisions?: Yes Patient able to express need for assistance with ADLs?: Yes Does the patient have difficulty dressing or bathing?: Yes Independently performs ADLs?: No Communication: Needs assistance Is this a change from baseline?: Pre-admission baseline Dressing (OT): Needs assistance Is this a change from baseline?: Pre-admission baseline Grooming: Needs assistance Is this a change from baseline?: Pre-admission baseline Feeding: Appropriate for developmental age Bathing: Needs assistance Is this a change from baseline?: Pre-admission baseline Toileting: Dependent Is this a change from baseline?: Change from baseline, expected to last <3 days In/Out Bed: Needs assistance Is this a change from baseline?: Change from baseline, expected to last <3 days Walks in Home: Needs assistance Is this a change from baseline?: Pre-admission baseline Does the patient have difficulty walking or climbing stairs?: Yes Weakness of Legs: Both Weakness of Arms/Hands: None  Permission Sought/Granted Permission sought to share information with : Family Supports Permission granted to share information with : Yes, Verbal Permission Granted  Share Information with NAME: Manus Gunning     Permission granted to share info  w Relationship: Daughter     Emotional Assessment Appearance:: Appears stated age Attitude/Demeanor/Rapport: Unable to Assess Affect (typically  observed): Unable to Assess Orientation: : Oriented to Self Alcohol / Substance Use: Not Applicable Psych Involvement: No (comment)  Admission diagnosis:  Aphasia [R47.01] Acute encephalopathy [G93.40] Altered mental status, unspecified altered mental status type [R41.82] Patient Active Problem List   Diagnosis Date Noted  . Acute encephalopathy 02/27/2019  . Vertigo 10/16/2018  . Chronic cough 01/16/2018  . Dementia ([place]) 03/08/2017  . Headache 10/01/2015  . Dark stools 06/22/2015  . History of CVA (cerebrovascular accident) 06/17/2014  . Healthcare maintenance 06/16/2014  . Frequent falls 04/28/2014  . Bradycardia 11/20/2013  . Hypercalcemia 06/07/2013  . Knee pain, chronic 01/14/2013  . NONEXUDATIVE SENILE MACULAR DEGENERATION RETINA 03/20/2008  . CONSTIPATION, CHRONIC 11/27/2006  . HLD (hyperlipidemia) 12/15/2005  . PULMONARY EMBOLISM, HX OF 12/15/2005  . Essential hypertension 10/26/2005  . GERD 10/26/2005  . Osteoarthritis of multiple joints 10/26/2005  . Knee joint replacement by other means 10/26/2005   PCP:  Delice Bison, DO Pharmacy:   [facility] [place] [address] Phone: [phone] Fax: [phone]  [place], [place] [address] Phone: [phone] Fax: [phone]     Social Determinants of Health (SDOH) Interventions    Readmission Risk Interventions No flowsheet data found.

## 2019-03-01 NOTE — Evaluation (Signed)
Occupational Therapy Evaluation Patient Details Name: Jennifer Owen MRN: [id number] DOB: 1931-04-15 Today's Date: 03/01/2019    History of Present Illness Pt is a 86 y.o. F with significant PMH of R PCA stroke, hypertension, dementia, PE after knee replacement who presents with 2 day history of word finding difficulty, confusion, slurred speech and inability to perform ADL's. CT with no acute abnormality. MRI negative for acute infarct but she does have a chronic right occipital lobe infarct   Clinical Impression   This 86 yo female admitted with above presents to acute OT with PLOF per daughter being Independent with bathing and toileting but needing occasional A for dressing and now is Mod-Max A for basic ADLs with +2 in standing and for ambulation due to decreased deficit awareness, safety awareness, decreased balance, decreased mobility, and decreased vision on left side (that patient compensating well for up until now due to patient functioning well in her home environment). She will benefit from acute OT with follow up OT on CIR to work on increasing independence to she can go home with daughter.  Follow Up Recommendations  CIR;Supervision/Assistance - 24 hour    Equipment Recommendations  Other (comment)(TBD next venue)       Precautions / Restrictions Precautions Precautions: Fall;Other (comment) Precaution Comments: Restless Restrictions Weight Bearing Restrictions: No      Mobility Bed Mobility Overal bed mobility: Needs Assistance Bed Mobility: Supine to Sit     Supine to sit: Min guard Sit to supine: Min guard   General bed mobility comments: Min guard for safety, no physical assist  Transfers Overall transfer level: Needs assistance Equipment used: Rolling walker (2 wheeled);None Transfers: Sit to/from Stand Sit to Stand: Min assist;+2 physical assistance;+2 safety/equipment Stand pivot transfers: Mod assist;+2 safety/equipment       General transfer  comment: MinA + 2 to stand from edge of bed and toilet. Cues for hand placement    Balance Overall balance assessment: Needs assistance Sitting-balance support: Feet supported Sitting balance-Leahy Scale: Fair     Standing balance support: During functional activity;Bilateral upper extremity supported Standing balance-Leahy Scale: Poor Standing balance comment: Unable to maintain standing without external support                           ADL either performed or assessed with clinical judgement   ADL Overall ADL's : Needs assistance/impaired Eating/Feeding: Moderate assistance;Sitting;Cueing for sequencing   Grooming: Moderate assistance;Sitting Grooming Details (indicate cue type and reason): Placed hand sanitizer in patients hands after bathroom and asked her to rub them together (she needed verbal and gestural cues to initiate as as hand over hand A to complete task) Upper Body Bathing: Moderate assistance;Sitting Upper Body Bathing Details (indicate cue type and reason): cues for sequencing Lower Body Bathing: Maximal assistance Lower Body Bathing Details (indicate cue type and reason): min A +2 sit<>stand but Mod A +2 to maintain standing (wanting to sit prematurely) Upper Body Dressing : Maximal assistance;Sitting   Lower Body Dressing: Total assistance Lower Body Dressing Details (indicate cue type and reason): min A +2 sit<>stand but Mod A +2 to maintain standing (wanting to sit prematurely) Toilet Transfer: Moderate assistance;+2 for physical assistance;Ambulation;Regular Toilet;Comfort height toilet;Grab bars Toilet Transfer Details (indicate cue type and reason): Pt wants to sit prematurely) Toileting- Clothing Manipulation and Hygiene: Moderate assistance Toileting - Clothing Manipulation Details (indicate cue type and reason): min A +2 sit<>stand but Mod A +2 to maintain standing (wanting to sit  prematurely)             Vision Baseline Vision/History:  Wears glasses Additional Comments: Pt with defintite decreased attention to left side visually, can't really get her to follow commands for testing due to dementia. She was able to find her daughter with increased time and with her daughter talking to her on her far left. Daughter is aware of her visual issues            Pertinent Vitals/Pain Pain Assessment: Faces Faces Pain Scale: No hurt     Hand Dominance Right   Extremity/Trunk Assessment Upper Extremity Assessment Upper Extremity Assessment: Generalized weakness           Communication Communication Communication: No difficulties   Cognition Arousal/Alertness: Awake/alert Behavior During Therapy: Restless;Impulsive Overall Cognitive Status: History of cognitive impairments - at baseline Area of Impairment: Orientation;Attention;Following commands;Safety/judgement;Awareness                 Orientation Level: Disoriented to;Place;Time;Situation Current Attention Level: Sustained   Following Commands: Follows one step commands inconsistently Safety/Judgement: Decreased awareness of safety;Decreased awareness of deficits Awareness: Intellectual   General Comments: Pt continues to be oriented to self only; less restless today as compared to yesterday (although daughter states they gave her medication for MRI). Pt needs max cues for safety and direction with navigating in room. Left sided inattention with no awareness.               Home Living Family/patient expects to be discharged to:: Private residence Living Arrangements: Children(daughter) Available Help at Discharge: Family;Available 24 hours/day Type of Home: House Home Access: Stairs to enter [employer] of Steps: 2 Entrance Stairs-Rails: Right;Left Home Layout: One level     Bathroom Shower/Tub: Chief Strategy Officer: Standard     Home Equipment: Cane - single point;Shower seat          Prior  Functioning/Environment Level of Independence: Needs assistance  Gait / Transfers Assistance Needed: uses cane for ambulation ADL's / Homemaking Assistance Needed: requires assist for dressing, IADL's, typically independent bathing            OT Problem List: Decreased strength;Decreased activity tolerance;Impaired balance (sitting and/or standing);Impaired vision/perception;Obesity;Decreased cognition;Decreased safety awareness      OT Treatment/Interventions: Self-care/ADL training;DME and/or AE instruction;Patient/family education;Balance training;Therapeutic activities    OT Goals(Current goals can be found in the care plan section) Acute Rehab OT Goals Patient Stated Goal: Pt daughter would like for pateint to go to rehab her at [facility] before going home OT Goal Formulation: With family Time For Goal Achievement: 03/15/19 Potential to Achieve Goals: Fair  OT Frequency: Min 2X/week           Co-evaluation PT/OT/SLP Co-Evaluation/Treatment: Yes Reason for Co-Treatment: For patient/therapist safety;To address functional/ADL transfers;Necessary to address cognition/behavior during functional activity PT goals addressed during session: Mobility/safety with mobility;Balance;Proper use of DME;Strengthening/ROM OT goals addressed during session: ADL's and self-care;Strengthening/ROM;Proper use of Adaptive equipment and DME      AM-PAC OT "6 Clicks" Daily Activity     Outcome Measure Help from another person eating meals?: A Lot Help from another person taking care of personal grooming?: A Lot Help from another person toileting, which includes using toliet, bedpan, or urinal?: A Lot Help from another person bathing (including washing, rinsing, drying)?: A Lot Help from another person to put on and taking off regular upper body clothing?: A Lot Help from another person to put on and taking off regular lower body clothing?:  Total 6 Click Score: 11   End of Session Equipment Utilized  During Treatment: Gait belt;Rolling walker Nurse Communication: Mobility status  Activity Tolerance: Patient tolerated treatment well Patient left: in chair;with call bell/phone within reach;with chair alarm set  OT Visit Diagnosis: Unsteadiness on feet (R26.81);Other abnormalities of gait and mobility (R26.89);Muscle weakness (generalized) (M62.81);Low vision, both eyes (H54.2)                Time: 1610-9604 OT Time Calculation (min): 29 min Charges:  OT General Charges $OT Visit: 1 Visit OT Evaluation $OT Eval Moderate Complexity: 1 Mod Cathy , OTR/L Acute [employer] Pager [phone] Office [phone]     03/01/2019, 2:26 PM

## 2019-03-01 NOTE — Progress Notes (Signed)
Pt transported down for MRI at 0613. Pt not back at shift change. On-coming nurse informed of the need to give IV magnesium when pt returns from MRI as V.O. states per [place].

## 2019-03-01 NOTE — Progress Notes (Signed)
Physical Therapy Treatment Patient Details Name: Jennifer Owen MRN: [id number] DOB: [birth year] Today's Date: 03/01/2019    History of Present Illness Pt is a 86 y.o. F with significant PMH of R PCA stroke, hypertension, PE after knee replacement who presents with 2 day history of word finding difficulty, confusion, slurred speech and inability to perform ADL's. CT with no acute abnormality. MRI pending.     PT Comments    Pt progressing slowly towards her physical therapy goals, remains confused and oriented to self only. Ambulating to bathroom with a walker and two person moderate assist. Demonstrates left sided inattention. Presents as a high fall risk based on balance deficits, decreased gait speed and safety awareness. This is a change from her functional baseline of ambulating modI with use of a cane. Would benefit from post acute rehab to address deficits and decrease caregiver burden.    Follow Up Recommendations  CIR;Supervision/Assistance - 24 hour (if going home, will need HHPT/OT, HH aide)     Equipment Recommendations  Rolling walker with 5" wheels;3in1 (PT);Wheelchair (measurements PT);Wheelchair cushion (measurements PT)    Recommendations for Other Services       Precautions / Restrictions Precautions Precautions: Fall;Other (comment) Precaution Comments: Restless Restrictions Weight Bearing Restrictions: No    Mobility  Bed Mobility Overal bed mobility: Needs Assistance Bed Mobility: Supine to Sit     Supine to sit: Min guard     General bed mobility comments: Min guard for safety, no physical assist  Transfers Overall transfer level: Needs assistance Equipment used: Rolling walker (2 wheeled);None Transfers: Sit to/from Stand Sit to Stand: Min assist;+2 physical assistance;+2 safety/equipment         General transfer comment: MinA + 2 to stand from edge of bed and toilet. Cues for hand placement  Ambulation/Gait Ambulation/Gait assistance: Mod  assist;+2 physical assistance;+2 safety/equipment Gait Distance (Feet): 10 Feet Assistive device: Rolling walker (2 wheeled) Gait Pattern/deviations: Step-through pattern;Decreased stride length;Trunk flexed Gait velocity: decreased Gait velocity interpretation: <1.31 ft/sec, indicative of household ambulator General Gait Details: Pt ambulating ~10 feet to bathroom with modA +2 for stability, increased trunk flexion throughout and bilateral knee instability. Pt attempting to sit prematurely.   Stairs             Wheelchair Mobility    Modified Rankin (Stroke Patients Only) Modified Rankin (Stroke Patients Only) Pre-Morbid Rankin Score: Moderate disability Modified Rankin: Moderately severe disability     Balance Overall balance assessment: Needs assistance Sitting-balance support: Feet supported Sitting balance-Leahy Scale: Fair     Standing balance support: During functional activity;Bilateral upper extremity supported Standing balance-Leahy Scale: Poor Standing balance comment: Unable to maintain standing without external support                            Cognition Arousal/Alertness: Awake/alert Behavior During Therapy: Restless;Impulsive Overall Cognitive Status: History of cognitive impairments - at baseline Area of Impairment: Orientation;Attention;Following commands;Safety/judgement;Awareness                 Orientation Level: Disoriented to;Place;Time;Situation Current Attention Level: Sustained   Following Commands: Follows one step commands inconsistently Safety/Judgement: Decreased awareness of safety;Decreased awareness of deficits Awareness: Intellectual   General Comments: Pt continues to be oriented to self only; less restless today as compared to yesterday (although daughter states they gave her medication for MRI). Pt needs max cues for safety and direction with navigating in room. Left sided inattention with no awareness.  Exercises      General Comments        Pertinent Vitals/Pain Pain Assessment: Faces Faces Pain Scale: No hurt    Home Living Family/patient expects to be discharged to:: Private residence Living Arrangements: Children(daughter) Available Help at Discharge: Family;Available 24 hours/day Type of Home: House Home Access: Stairs to enter Entrance Stairs-Rails: Right;Left Home Layout: One level Home Equipment: Cane - single point;Shower seat      Prior Function            PT Goals (current goals can now be found in the care plan section) Acute Rehab PT Goals Patient Stated Goal: pt daughter would like her cognition to improve Potential to Achieve Goals: Fair Progress towards PT goals: Progressing toward goals    Frequency    Min 3X/week      PT Plan Current plan remains appropriate    Co-evaluation PT/OT/SLP Co-Evaluation/Treatment: Yes Reason for Co-Treatment: For patient/therapist safety;To address functional/ADL transfers;Necessary to address cognition/behavior during functional activity          AM-PAC PT "6 Clicks" Mobility   Outcome Measure  Help needed turning from your back to your side while in a flat bed without using bedrails?: A Little Help needed moving from lying on your back to sitting on the side of a flat bed without using bedrails?: A Little Help needed moving to and from a bed to a chair (including a wheelchair)?: A Lot Help needed standing up from a chair using your arms (e.g., wheelchair or bedside chair)?: A Little Help needed to walk in hospital room?: A Lot Help needed climbing 3-5 steps with a railing? : Total 6 Click Score: 14    End of Session Equipment Utilized During Treatment: Gait belt Activity Tolerance: Patient tolerated treatment well Patient left: in chair;with call bell/phone within reach;with chair alarm set;with family/visitor present Nurse Communication: Mobility status PT Visit Diagnosis: Unsteadiness on feet  (R26.81);Difficulty in walking, not elsewhere classified (R26.2)     Time: 1206-1229 PT Time Calculation (min) (ACUTE ONLY): 23 min  Charges:  $Therapeutic Activity: 8-22 mins                       Jennifer Owen, PT, DPT Acute Rehabilitation Services Pager [phone] Office [phone]    Jennifer Owen 03/01/2019, 1:09 PM

## 2019-03-01 NOTE — Procedures (Signed)
Patient Name: Jennifer Owen  MRN: [id number]  Epilepsy Attending: Charlsie Quest  Referring Physician/Provider: Annie Main, NP Date: 02/28/2019 Duration: 22.33 minutes  Patient history: 86 year old female with history of right PCA territory stroke presented with 2-day history of word finding difficulties confusion slurred speech and inability to perform ADLs.  EEG to evaluate for seizures.  Level of alertness: Awake  AEDs during EEG study: None  Technical aspects: This EEG study was done with scalp electrodes positioned according to the 10-20 International system of electrode placement. Electrical activity was acquired at a sampling rate of 500Hz  and reviewed with a high frequency filter of 70Hz  and a low frequency filter of 1Hz . EEG data were recorded continuously and digitally stored.   BACKGROUND ACTIVITY: During awake state, no clear posterior dominant rhythm was seen.  EEG showed continuous generalized polymorphic high amplitude 3 to 5 Hz theta-delta slowing.  Hyperventilation and photic stimulation were not performed.  Abnormality -Continuous slow, generalized  IMPRESSION: This study is suggestive of moderate diffuse encephalopathy, nonspecific to etiology.  No seizures or epileptiform discharges were seen throughout the recording.    Charlynn Salih 

## 2019-03-01 NOTE — Progress Notes (Signed)
STROKE TEAM PROGRESS NOTE   INTERVAL HISTORY Patient daughter is at the bedside.  She continues to be confused.  She had hallucinations last night and trouble sleeping.  EEG done yesterday shows moderate diffuse encephalopathy which is nonspecific.  No seizures.  MRI scan shows no acute infarct.  Old right occipital infarct Vitals:   02/28/19 2336 03/01/19 0343 03/01/19 0902 03/01/19 1251  BP: 98/78 137/70 (!) 117/58 97/79  Pulse: 83 80 79 88  Resp: 20 20 18 20   Temp: 98.1 F (36.7 C) 97.9 F (36.6 C) 98.4 F (36.9 C) 97.7 F (36.5 C)  TempSrc:  Oral Oral Oral  SpO2:  97% 100% 100%  Weight:      Height:        CBC:  Recent Labs  Lab 02/27/19 1217 02/27/19 1217 02/28/19 0007 03/01/19 0441  WBC 4.1  --   --  4.9  NEUTROABS 1.9  --   --   --   HGB 12.1   < > 11.9* 11.6*  HCT 37.8   < > 35.0* 34.9*  MCV 109.9*  --   --  104.5*  PLT 224  --   --  211   < > = values in this interval not displayed.    Basic Metabolic Panel:  Recent Labs  Lab 02/27/19 2011 02/28/19 0007 02/28/19 0349 03/01/19 0441  NA  --    < > 142 142  K  --    < > 3.6 4.3  CL  --   --  109 109  CO2  --   --  23 24  GLUCOSE  --   --  89 94  BUN  --   --  23 19  CREATININE  --   --  0.85 0.87  CALCIUM  --   --  10.1 10.2  MG 1.0*  --   --  1.5*   < > = values in this interval not displayed.   Lipid Panel:     Component Value Date/Time   CHOL 157 02/27/2019 2011   CHOL 169 10/03/2016 1422   TRIG 65 02/27/2019 2011   HDL 98 02/27/2019 2011   HDL 116 10/03/2016 1422   CHOLHDL 1.6 02/27/2019 2011   VLDL 13 02/27/2019 2011   LDLCALC 46 02/27/2019 2011   LDLCALC 39 10/03/2016 1422   HgbA1c:  Lab Results  Component Value Date   HGBA1C 5.2 02/27/2019   Urine Drug Screen:     Component Value Date/Time   LABOPIA NEG 07/25/2012 1631   COCAINSCRNUR NEG 07/25/2012 1631   LABBENZ NEG 07/25/2012 1631   AMPHETMU NEG 07/25/2012 1631   THCU NEG 07/25/2012 1631   LABBARB NEG 07/25/2012 1631     Alcohol Level     Component Value Date/Time   ETH <10 02/27/2019 1217    IMAGING past 48 hours EEG  Result Date: 03/01/2019 Charlsie Quest, MD     03/01/2019  9:13 AM Patient Name: Jennifer Owen MRN: [id number] Epilepsy Attending: Charlsie Quest Referring Physician/Provider: Annie Main, NP Date: 02/28/2019 Duration: 22.33 minutes Patient history: 86 year old female with history of right PCA territory stroke presented with 2-day history of word finding difficulties confusion slurred speech and inability to perform ADLs.  EEG to evaluate for seizures. Level of alertness: Awake AEDs during EEG study: None Technical aspects: This EEG study was done with scalp electrodes positioned according to the 10-20 International system of electrode placement. Electrical activity was acquired at a sampling rate  of 500Hz  and reviewed with a high frequency filter of 70Hz  and a low frequency filter of 1Hz . EEG data were recorded continuously and digitally stored. BACKGROUND ACTIVITY: During awake state, no clear posterior dominant rhythm was seen.  EEG showed continuous generalized polymorphic high amplitude 3 to 5 Hz theta-delta slowing.  Hyperventilation and photic stimulation were not performed. Abnormality -Continuous slow, generalized IMPRESSION: This study is suggestive of moderate diffuse encephalopathy, nonspecific to etiology.  No seizures or epileptiform discharges were seen throughout the recording. Charlsie Quest   MR ANGIO HEAD WO CONTRAST  Result Date: 03/01/2019 CLINICAL DATA:  Speech difficulty. Additional provided: Increasing confusion and disorientation with history of prior stroke. EXAM: MRI HEAD WITHOUT CONTRAST MRA HEAD WITHOUT CONTRAST TECHNIQUE: Multiplanar, multiecho pulse sequences of the brain and surrounding structures were obtained without intravenous contrast. Angiographic images of the head were obtained using MRA technique without contrast. COMPARISON:  Head CT 02/27/2019, brain  MRI 10/06/2011 FINDINGS: MRI HEAD FINDINGS Brain: The examination is significantly motion degraded. Most notably there is moderate motion degradation of the coronal diffusion weighted sequence, severe motion degradation of the sagittal T1 weighted sequence, moderate motion degradation of the axial T2 weighted sequence, moderate/severe motion degradation of the axial T2 FLAIR sequence, severe motion degradation of the axial SWI sequence, severe motion degradation of the axial T1 weighted sequence and moderate/severe motion degradation of the axial T2 weighted sequence. Redemonstrated chronic right occipital lobe cortical/subcortical infarct. There is a thin curvilinear focus of diffusion weighted hyperintensity within the chronic infarction territory which does not have a definite ADC correlate and is suspected related to chronic petechial hemorrhage at this site. However, the presence of petechial hemorrhage is difficult to confirm due to the severe motion degradation of the SWI sequence. ild scattered T2/FLAIR hyperintensity within the cerebral white matter is nonspecific, but consistent with chronic small vessel ischemic disease. No evidence of acute infarct elsewhere within the brain. No intracranial mass is identified. No midline shift. Moderate generalized parenchymal atrophy. Similar to prior MRI 10/06/2011, there are CSF intensity foci extending inferiorly posteromedial to the cavernous sinuses, which may reflect small skull base meningoceles or arachnoid cysts. Vascular: Flow voids maintained within the proximal large arterial vessels. Skull and upper cervical spine: Within described limitations, no focal marrow lesion is identified. Sinuses/Orbits: Visualized orbits demonstrate no acute abnormality. Redemonstrated large right maxillary sinus mucous retention cyst nearly occupying the majority of the right maxillary sinus. No significant mastoid effusion. MRA HEAD FINDINGS The examination is moderate to  severely motion degraded. This limits evaluation for stenoses and for small aneurysms. The intracranial internal carotid arteries, middle cerebral arteries and anterior cerebral arteries are patent without proximal branch occlusion. Atherosclerotic irregularity of the intracranial internal carotid arteries. Suspected moderate to moderately advanced focal stenosis within the cavernous right ICA segment (series 1037, image 3). Suspected sites of up to moderate stenosis within the cavernous left ICA. No definite aneurysm is identified. The intracranial vertebral arteries are patent, as is the basilar artery. Apparent moderate/severe stenosis within the distal left vertebral artery just proximal to the vertebrobasilar junction The right posterior cerebral artery is poorly delineated shortly beyond its origin and may be chronically occluded. Flow related signal is seen within the proximal left PCA. Posterior communicating arteries are poorly delineated and may be hypoplastic or absent bilaterally. IMPRESSION: MRI brain: 1. Significantly motion degraded and limited examination as described. The axial diffusion-weighted imaging is of good quality. A small curvilinear focus of diffusion weighted hyperintensity within a chronic right  occipital lobe infarct is suspected related to chronic petechial hemorrhage at this site, as described. No convincing evidence of acute infarct or other acute intracranial abnormality. 2. Moderate generalized parenchymal atrophy with mild chronic small vessel ischemic disease. 3. Inferiorly projecting small skull base CSF intensity arachnoid cysts versus meningoceles, similar to prior MRI 10/06/2011. 4. Large right maxillary sinus mucous retention cyst. MRA head: 1. Moderate to severely motion degraded exam. This limits evaluation for stenoses and small aneurysms. 2. No anterior circulation proximal large vessel occlusion. 3. Atherosclerotic irregularity of the intracranial internal carotid  arteries. Suspected moderate to moderately advanced focal stenosis within the cavernous right ICA. Sites of up to moderate stenosis within the cavernous left ICA. 4. Apparent moderate/severe focal stenosis within the distal left vertebral artery, just proximal to the vertebrobasilar junction. 5. The right posterior cerebral artery is poorly delineated shortly beyond its origin and may be chronically occluded. Electronically Signed   By: Jackey Loge DO   On: 03/01/2019 08:48   MR BRAIN WO CONTRAST  Result Date: 03/01/2019 CLINICAL DATA:  Speech difficulty. Additional provided: Increasing confusion and disorientation with history of prior stroke. EXAM: MRI HEAD WITHOUT CONTRAST MRA HEAD WITHOUT CONTRAST TECHNIQUE: Multiplanar, multiecho pulse sequences of the brain and surrounding structures were obtained without intravenous contrast. Angiographic images of the head were obtained using MRA technique without contrast. COMPARISON:  Head CT 02/27/2019, brain MRI 10/06/2011 FINDINGS: MRI HEAD FINDINGS Brain: The examination is significantly motion degraded. Most notably there is moderate motion degradation of the coronal diffusion weighted sequence, severe motion degradation of the sagittal T1 weighted sequence, moderate motion degradation of the axial T2 weighted sequence, moderate/severe motion degradation of the axial T2 FLAIR sequence, severe motion degradation of the axial SWI sequence, severe motion degradation of the axial T1 weighted sequence and moderate/severe motion degradation of the axial T2 weighted sequence. Redemonstrated chronic right occipital lobe cortical/subcortical infarct. There is a thin curvilinear focus of diffusion weighted hyperintensity within the chronic infarction territory which does not have a definite ADC correlate and is suspected related to chronic petechial hemorrhage at this site. However, the presence of petechial hemorrhage is difficult to confirm due to the severe motion  degradation of the SWI sequence. ild scattered T2/FLAIR hyperintensity within the cerebral white matter is nonspecific, but consistent with chronic small vessel ischemic disease. No evidence of acute infarct elsewhere within the brain. No intracranial mass is identified. No midline shift. Moderate generalized parenchymal atrophy. Similar to prior MRI 10/06/2011, there are CSF intensity foci extending inferiorly posteromedial to the cavernous sinuses, which may reflect small skull base meningoceles or arachnoid cysts. Vascular: Flow voids maintained within the proximal large arterial vessels. Skull and upper cervical spine: Within described limitations, no focal marrow lesion is identified. Sinuses/Orbits: Visualized orbits demonstrate no acute abnormality. Redemonstrated large right maxillary sinus mucous retention cyst nearly occupying the majority of the right maxillary sinus. No significant mastoid effusion. MRA HEAD FINDINGS The examination is moderate to severely motion degraded. This limits evaluation for stenoses and for small aneurysms. The intracranial internal carotid arteries, middle cerebral arteries and anterior cerebral arteries are patent without proximal branch occlusion. Atherosclerotic irregularity of the intracranial internal carotid arteries. Suspected moderate to moderately advanced focal stenosis within the cavernous right ICA segment (series 1037, image 3). Suspected sites of up to moderate stenosis within the cavernous left ICA. No definite aneurysm is identified. The intracranial vertebral arteries are patent, as is the basilar artery. Apparent moderate/severe stenosis within the distal left vertebral artery  just proximal to the vertebrobasilar junction The right posterior cerebral artery is poorly delineated shortly beyond its origin and may be chronically occluded. Flow related signal is seen within the proximal left PCA. Posterior communicating arteries are poorly delineated and may be  hypoplastic or absent bilaterally. IMPRESSION: MRI brain: 1. Significantly motion degraded and limited examination as described. The axial diffusion-weighted imaging is of good quality. A small curvilinear focus of diffusion weighted hyperintensity within a chronic right occipital lobe infarct is suspected related to chronic petechial hemorrhage at this site, as described. No convincing evidence of acute infarct or other acute intracranial abnormality. 2. Moderate generalized parenchymal atrophy with mild chronic small vessel ischemic disease. 3. Inferiorly projecting small skull base CSF intensity arachnoid cysts versus meningoceles, similar to prior MRI 10/06/2011. 4. Large right maxillary sinus mucous retention cyst. MRA head: 1. Moderate to severely motion degraded exam. This limits evaluation for stenoses and small aneurysms. 2. No anterior circulation proximal large vessel occlusion. 3. Atherosclerotic irregularity of the intracranial internal carotid arteries. Suspected moderate to moderately advanced focal stenosis within the cavernous right ICA. Sites of up to moderate stenosis within the cavernous left ICA. 4. Apparent moderate/severe focal stenosis within the distal left vertebral artery, just proximal to the vertebrobasilar junction. 5. The right posterior cerebral artery is poorly delineated shortly beyond its origin and may be chronically occluded. Electronically Signed   By: Jackey Loge DO   On: 03/01/2019 08:48   ECHOCARDIOGRAM COMPLETE  Result Date: 02/28/2019    ECHOCARDIOGRAM REPORT   Patient Name:   Jennifer Owen Date of Exam: 02/28/2019 Medical Rec #:  [id number]         Height:       66.0 in Accession #:    [id number]        Weight:       185.4 lb Date of Birth:  02-16-1931         BSA:          1.94 m Patient Age:    87 years          BP:           132/69 mmHg Patient Gender: F                 HR:           65 bpm. Exam Location:  Inpatient Procedure: 2D Echo, Color Doppler and Cardiac  Doppler STAT ECHO Indications:    Stroke  History:        Patient has no prior history of Echocardiogram examinations.                 Risk Factors:Hypertension and Dyslipidemia.  Sonographer:    Irving Burton Senior RDCS Referring Phys: [id number] [place] DYKSTRA IMPRESSIONS  1. Left ventricular ejection fraction, by estimation, is 60 to 65%. The left ventricle has normal function. The left ventricle has no regional wall motion abnormalities. Left ventricular diastolic parameters are consistent with Grade I diastolic dysfunction (impaired relaxation).  2. Right ventricular systolic function is normal. The right ventricular size is normal. There is mildly elevated pulmonary artery systolic pressure. The estimated right ventricular systolic pressure is 39.2 mmHg.  3. The mitral valve is normal in structure and function. Trivial mitral valve regurgitation.  4. The aortic valve is normal in structure and function. Aortic valve regurgitation is not visualized. No aortic stenosis is present.  5. The inferior vena cava is normal in size with greater than 50% respiratory variability,  suggesting right atrial pressure of 3 mmHg. FINDINGS  Left Ventricle: Left ventricular ejection fraction, by estimation, is 60 to 65%. The left ventricle has normal function. The left ventricle has no regional wall motion abnormalities. The left ventricular internal cavity size was normal in size. There is  no left ventricular hypertrophy. Left ventricular diastolic parameters are consistent with Grade I diastolic dysfunction (impaired relaxation). Right Ventricle: The right ventricular size is normal. No increase in right ventricular wall thickness. Right ventricular systolic function is normal. There is mildly elevated pulmonary artery systolic pressure. The tricuspid regurgitant velocity is 3.01  m/s, and with an assumed right atrial pressure of 3 mmHg, the estimated right ventricular systolic pressure is 39.2 mmHg. Left Atrium: Left atrial size was  normal in size. Right Atrium: Right atrial size was normal in size. Pericardium: There is no evidence of pericardial effusion. Mitral Valve: The mitral valve is normal in structure and function. Trivial mitral valve regurgitation. Tricuspid Valve: The tricuspid valve is normal in structure. Tricuspid valve regurgitation is trivial. Aortic Valve: The aortic valve is normal in structure and function. Aortic valve regurgitation is not visualized. No aortic stenosis is present. Pulmonic Valve: The pulmonic valve was normal in structure. Pulmonic valve regurgitation is trivial. Aorta: The aortic root is normal in size and structure. Venous: The inferior vena cava is normal in size with greater than 50% respiratory variability, suggesting right atrial pressure of 3 mmHg. IAS/Shunts: No atrial level shunt detected by color flow Doppler.  LEFT VENTRICLE PLAX 2D LVIDd:         3.43 cm  Diastology LVIDs:         2.18 cm  LV e' lateral:   12.70 cm/s LV PW:         0.81 cm  LV E/e' lateral: 6.1 LV IVS:        0.91 cm  LV e' medial:    5.98 cm/s LVOT diam:     1.90 cm  LV E/e' medial:  13.0 LV SV:         92.43 ml LV SV Index:   16.28 LVOT Area:     2.84 cm  RIGHT VENTRICLE RV S prime:     19.40 cm/s TAPSE (M-mode): 2.0 cm LEFT ATRIUM             Index       RIGHT ATRIUM           Index LA diam:        3.50 cm 1.81 cm/m  RA Area:     14.50 cm LA Vol (A2C):   34.6 ml 17.87 ml/m RA Volume:   37.60 ml  19.42 ml/m LA Vol (A4C):   30.0 ml 15.49 ml/m LA Biplane Vol: 33.1 ml 17.09 ml/m  AORTIC VALVE LVOT Vmax:   126.00 cm/s LVOT Vmean:  85.800 cm/s LVOT VTI:    0.326 m  AORTA Ao Root diam: 3.00 cm Ao Asc diam:  3.50 cm MITRAL VALVE                TRICUSPID VALVE MV Area (PHT): 2.83 cm     TR Peak grad:   36.2 mmHg MV Decel Time: 268 msec     TR Vmax:        301.00 cm/s MV E velocity: 77.60 cm/s MV A velocity: 114.00 cm/s  SHUNTS MV E/A ratio:  0.68         Systemic VTI:  0.33 m  Systemic Diam: 1.90 cm  Marca Ancona MD Electronically signed by Marca Ancona MD Signature Date/Time: 02/28/2019/10:51:28 AM    Final    US THYROID  Result Date: 03/01/2019 CLINICAL DATA:  Hyperthyroidism EXAM: THYROID ULTRASOUND TECHNIQUE: Ultrasound examination of the thyroid gland and adjacent soft tissues was performed. Technologist describes technically difficult study secondary to altered mental status, combative patient, who refused the exam. COMPARISON:  None. FINDINGS: Parenchymal Echotexture: Mildly heterogenous Isthmus: 0.5 cm thickness Right lobe: 4.2 x 2.1 x 1.8 cm Left lobe: Not documented _________________________________________________________ Estimated total number of nodules >/= 1 cm: 0 Number of spongiform nodules >/=  2 cm not described below (TR1): 0 Number of mixed cystic and solid nodules >/= 1.5 cm not described below (TR2): 0 _________________________________________________________ Scattered subcentimeter benign colloid cysts are noted in the right lobe. No significant evaluation of the left lobe as detailed above. IMPRESSION: Incomplete study due to patient issues as above. No worrisome lesion demonstrated. The above is in keeping with the ACR TI-RADS recommendations - J Am Coll Radiol 2017;14:587-595. Electronically Signed   By: Corlis Leak M.D.   On: 03/01/2019 13:50   VAS US CAROTID  Result Date: 03/01/2019 Carotid Arterial Duplex Study Indications:       CVA. Limitations        Today's exam was limited due to the patient's inability or                    unwillingness to cooperate and Severe dementia, unable to                    hold still. Comparison Study:  No prior exam. Performing Technologist: Kennedy Bucker ARDMS, RVT  Examination Guidelines: A complete evaluation includes B-mode imaging, spectral Doppler, color Doppler, and power Doppler as needed of all accessible portions of each vessel. Bilateral testing is considered an integral part of a complete examination. Limited examinations for  reoccurring indications may be performed as noted.  Right Carotid Findings: +----------+--------+--------+--------+------------------+------------------+           PSV cm/sEDV cm/sStenosisPlaque DescriptionComments           +----------+--------+--------+--------+------------------+------------------+ CCA Prox  127     13                                                   +----------+--------+--------+--------+------------------+------------------+ CCA Distal114     13                                intimal thickening +----------+--------+--------+--------+------------------+------------------+ ICA Prox  69      8       1-39%   heterogenous                         +----------+--------+--------+--------+------------------+------------------+ ICA Distal78      10                                                   +----------+--------+--------+--------+------------------+------------------+ ECA       85                                                           +----------+--------+--------+--------+------------------+------------------+ +----------+--------+-------+----------------+-------------------+  PSV cm/sEDV cmsDescribe        Arm Pressure (mmHG) +----------+--------+-------+----------------+-------------------+ [id number]            Multiphasic, WNL                    +----------+--------+-------+----------------+-------------------+ +---------+--------+--+--------+--+---------+ VertebralPSV cm/s98EDV cm/s11Antegrade +---------+--------+--+--------+--+---------+  Left Carotid Findings: +----------+--------+--------+--------+------------------+------------------+           PSV cm/sEDV cm/sStenosisPlaque DescriptionComments           +----------+--------+--------+--------+------------------+------------------+ CCA Prox  127     17                                                    +----------+--------+--------+--------+------------------+------------------+ CCA Distal120     17                                intimal thickening +----------+--------+--------+--------+------------------+------------------+ ICA Prox  109     27      1-39%                     tortuous           +----------+--------+--------+--------+------------------+------------------+ ICA Distal106     15                                                   +----------+--------+--------+--------+------------------+------------------+ ECA       91      9                                                    +----------+--------+--------+--------+------------------+------------------+ +----------+--------+--------+----------------+-------------------+           PSV cm/sEDV cm/sDescribe        Arm Pressure (mmHG) +----------+--------+--------+----------------+-------------------+ Subclavian160             Multiphasic, WNL                    +----------+--------+--------+----------------+-------------------+ +---------+--------+--+--------+--+---------+ VertebralPSV cm/s89EDV cm/s12Antegrade +---------+--------+--+--------+--+---------+   Summary: Right Carotid: Velocities in the right ICA are consistent with a 1-39% stenosis. Left Carotid: Velocities in the left ICA are consistent with a 1-39% stenosis. Vertebrals:  Bilateral vertebral arteries demonstrate antegrade flow. Subclavians: Normal flow hemodynamics were seen in bilateral subclavian              arteries. *See table(s) above for measurements and observations.  Electronically signed by Delia Heady MD on 03/01/2019 at 7:46:09 AM.    Final     PHYSICAL EXAM Frail elderly African-American lady not in distress. . Afebrile. Head is nontraumatic. Neck is supple without bruit.    Cardiac exam no murmur or gallop. Lungs are clear to auscultation. Distal pulses are well felt. Neurological Exam ;  Awake  Alert oriented x 1.  Diminished  attention, registration and recall.  Poor insight into her illness.  Hesitant speech and impaired comprehension naming and repetition.[place] movements full without nystagmus.fundi were not visualized.  Dense left homonymous hemianopsia.  Hearing is normal. Palatal movements are normal. Face symmetric.  Tongue midline. Normal strength, tone, reflexes and coordination. Normal sensation. Gait deferred.  ASSESSMENT/PLAN [name] is a 86 y.o. female with history of R PCA territory stroke discovered after an eye examination revealed field deficits, arthritis, hypertension, hyperlipidemia, PE after knee replacement not on anticoagulation currently on low-dose aspirin, presenting with 2 day hx of word finding difficulty, confusion, slurred speech and inability to perform her ADLs..    Increasing confusion disorientation and the patient with baseline dementia and old stroke unclear whether this is a new stroke.  MRI scan is suboptimal but shows no definite acute infarct   CT head No acute abnormality. Old R PCA infarct  MRI pending  MRA pending  Carotid Doppler pending  2D Echo normal ejection fraction 60 to 65%.  LDL 46  HgbA1c 5.2  Lovenoc for VTE prophylaxis  aspirin 81 mg daily prior to admission, now on aspirin 81 mg daily.    Therapy recommendations: pending  Disposition:  pending  Hypertension  Stable . Permissive hypertension (OK if < 220/120) but gradually normalize in 5-7 days . Long-term BP goal normotensive  Hyperlipidemia  Home meds:  lipitor 40, resumed in hospital  LDL 46, goal < 70  Continue statin at discharge  Diabetes    Not known to be one  Home meds:  none  HgbA1c 5.2, goal < 7.0  CBGs  No results for input(s): GLUCAP in the last 72 hours.   Other Stroke Risk Factors  Advanced age  Former Cigarette smoker, quit 76 yrs ago  Obesity, Body mass index is 30.96 kg/m., recommend weight loss, diet and exercise as appropriate   Hx  stroke/TIA  Old- R PCA territory infarct   Hx PE following knee replacement  Other Active Problems  Baseline dementia on namenda  History of polio in 1942 at the age of 14 years  TSH 0.016 - needs correction by primary team   Magnesium 1.0  Hospital day # 2 .  Patient has baseline dementia and presented with increased confusion disorientation for a few days.  MRI is negative for acute stroke and EEG shows only diffuse slowing but no definite epileptiform activity.  There appears to be no clear explanation for her increasing confusion.  Recommend adding Seroquel 12.5 mg at night to help with her hallucinations and to help with agitation and sleeping as well.  I had a long discussion with the patient and daughter at the bedside and answered questions.  Continue aspirin for now.  Thyroid replacement as per primary team.  Stroke team will sign off.  Kindly call for questions.  Greater than 50% time during this 25-minute visit was spent on counseling and coordination of care and discussion with care team. Delia Heady, MD Medical Director [facility] Pager: [phone] 03/01/2019 3:08 PM   To contact Stroke Continuity provider, please refer to [email or web address]. After hours, contact General Neurology

## 2019-03-01 NOTE — Progress Notes (Signed)
Spoke with rep (MRI) at 2346 about pt being placed for MRI. [place] informed about the wait for a call back from MRI

## 2019-03-02 DIAGNOSIS — R410 Disorientation, unspecified: Secondary | ICD-10-CM | POA: Diagnosis present

## 2019-03-02 DIAGNOSIS — G9340 Encephalopathy, unspecified: Secondary | ICD-10-CM

## 2019-03-02 DIAGNOSIS — E0590 Thyrotoxicosis, unspecified without thyrotoxic crisis or storm: Secondary | ICD-10-CM | POA: Diagnosis present

## 2019-03-02 LAB — GLUCOSE, CAPILLARY: Glucose-Capillary: 82 mg/dL (ref 70–99)

## 2019-03-02 NOTE — Progress Notes (Addendum)
   Subjective:   Ms. Ronning was seen sleeping in her bed. Her daughter was NOT at bedside. She appeared comfortable.   Called patient's daughter and she felt that her mother is not at back to baseline. She stated that the patient is able to bathe herself, walk around the house with cane, feed herself.   Objective:  Vital signs in last 24 hours: Vitals:   03/01/19 1622 03/01/19 1923 03/01/19 2351 03/02/19 0358  BP: (!) 150/82 (!) 150/65 (!) 115/102 (!) 141/55  Pulse: (!) 103 86 72 79  Resp: 20 16 16 17   Temp: 98.3 F (36.8 C) 98.4 F (36.9 C) 98.7 F (37.1 C) 98.6 F (37 C)  TempSrc: Oral Oral Oral Oral  SpO2: 100% 100% 100% 100%  Weight:      Height:       General: Sleeping calmly, in no acute distress  CV: RRR, s1 and s2 audible  Lungs: Normal breath sounds without crackles or rales  Abdomen: Abdomen soft, not tender  Neurology: Reacts to sternal rub, corneal reflex and pupillary reflex intact   Assessment/Plan:  Principal Problem:   Acute encephalopathy Active Problems:   History of CVA (cerebrovascular accident)  Mr. Payton is a 86 y.o female with dementia, hx of right pca stroke, htn, pe after knee replacement who presented with difficulty finding words, confusion, slurred speech, decreased functional status.   Acute encephalopathy  MRI was motion degraded but showed chronic right occipital lobe infarct without any acute intracranial abnormality. MRA head showed moderate/severe focal stenosis in left vertebral artery. TTE lvef 60-65%, g1dd, mildly elev pa pressure, ivc normal in size. EEG showed generalized slowing without epileptiform discharges. Vas ultrsound showing 1-39% stenosis bilaterally   Stroke team has signed off. Added seroquel 12.5mg  qhs to help with hallucinations and agitation.  -continue aspirin 81mg  qd -continue atorvastatin 40mg  qd  -delirirum precautions -PT/OT recommend CIR but patient does not qualify so she will have HHPT, aide, rolling walker  with 5" wheels, wheelchair cushion, 3 in1.   Hyperthyroidism  Free t4 1.41, TSH 0.016. 83 thyroid limited due to patient being agitated.  showed mildly heterogenous parenchyma with scattered subcentimeter benign colloid cysts in right lobe.   -thyrotropin receptor ab pending  -RAIU only done during week. Patient has not had any recent contrast and is not any thyroid medication currently.   Hypertension: continue amlodipine 10mg  qd Hyperlipidemia: continue atorvastatin 40mg  qd   Macrocytosis: MMA pending   Dispo: Anticipated discharge in approximately 0-1 day(s).   , MD 03/02/2019, 7:30 AM

## 2019-03-03 NOTE — Plan of Care (Signed)

## 2019-03-03 NOTE — Progress Notes (Signed)
   Subjective:   Jennifer Owen was seen sitting up in her bed with her daughter at bedside. She was unable to state where she was. Daughter states that Jennifer Owen was not at her baseline.   Objective:  Vital signs in last 24 hours: Vitals:   03/02/19 2050 03/02/19 2306 03/03/19 0259 03/03/19 0745  BP: (!) 157/67 (!) 140/95 (!) 149/93 139/60  Pulse: 92 (!) 117 95 95  Resp: 15 19 16 16   Temp: 98.3 F (36.8 C) 97.7 F (36.5 C) 98.4 F (36.9 C) 98.5 F (36.9 C)  TempSrc: Oral Oral Oral   SpO2: 99% 98%  100%  Weight:      Height:       Physical Exam  Constitutional: She appears well-developed and well-nourished. No distress.  HENT:  Head: Normocephalic and atraumatic.  Cardiovascular: Normal rate, regular rhythm and normal heart sounds.  Respiratory: Effort normal and breath sounds normal. No respiratory distress. She has no wheezes.  GI: Soft. Bowel sounds are normal. She exhibits no distension. There is no abdominal tenderness.  Neurological:  Alert, not oriented, able to converse but thought content is not congruent  Skin: She is not diaphoretic.   Assessment/Plan:  Principal Problem:   Acute encephalopathy Active Problems:   History of CVA (cerebrovascular accident)   Dementia (HCC)   Hyperthyroidism   Acute delirium  Jennifer Owen is a 86 y.o female with dementia, hx of right pca stroke, htn, pe after knee replacement who presented with difficulty finding words, confusion, slurred speech, decreased functional status.   Acute encephalopathy  CT and MRI did not show any presence of stroke. Neurology team has signed off. Concern for possible thyrotoxicosis causing ams. Getting RAIU scan 2/22 and 2/23. Patient is afebrile unlikely infectious process.   -continue seroquel 12.5mg  qhs  -continue aspirin 81mg  qd -continue atorvastatin 40mg  qd  -delirirum precautions -PT/OT recommend CIR but patient does not qualify so she will have HHPT, aide, rolling walker with 5" wheels,  wheelchair cushion, 3 in1.   Hyperthyroidism  Free t4 1.41, tsh 0.016.  3/23 thyroid limited due to patient being agitated.  showed mildly heterogenous parenchyma with scattered subcentimeter benign colloid cysts in right lobe.  -Radioactive iodine uptake scan 2/22 and 2/23 -thyrotropin receptor ab pending   Hypertension: continue amlodipine 10mg  qd Hyperlipidemia: continue atorvastatin 40mg  qd   Macrocytosis: MMA pending    Dispo: Anticipated discharge in approximately 2-3 day(s).   [place], MD 03/03/2019, 11:15 AM

## 2019-03-04 ENCOUNTER — Inpatient Hospital Stay (HOSPITAL_COMMUNITY): Payer: Medicare Other

## 2019-03-04 LAB — METHYLMALONIC ACID, SERUM: Methylmalonic Acid, Quantitative: 109 nmol/L (ref 0–378)

## 2019-03-04 LAB — THYROTROPIN RECEPTOR AUTOABS: Thyrotropin Receptor Ab: <1.1 IU/L (ref 0.00–1.75)

## 2019-03-04 MED ORDER — SODIUM IODIDE I 131 CAPSULE
10.9000 | Freq: Once | INTRAVENOUS | Status: AC | PRN
  Administered 2019-03-04: 08:00:00 10.9 via ORAL

## 2019-03-04 NOTE — Progress Notes (Cosign Needed)
Patient suffers from encephalopathy and weakness which impairs their ability to perform daily activities like ambulating and dressing in the home. A walker will not resolve  issue with performing activities of daily living. A wheelchair will allow patient to safely perform daily activities. Patient is not able to propel themselves in the home using a standard weight wheelchair due to weakness. Patient can self propel in the lightweight wheelchair. Length of need lifetime.  Accessories: elevating leg rests (ELRs), wheel locks, extensions and anti-tippers.

## 2019-03-04 NOTE — Progress Notes (Signed)
  Subjective:  Daughter states that the patient was able to sleep all night and she is more conversive but still difficult to understand. She is also not able to eat by herself but that may be due to arthritis. She continues to pick at her IVs.   Objective:    Vital Signs (last 24 hours): Vitals:   03/03/19 1503 03/03/19 1917 03/03/19 2259 03/04/19 0308  BP: (!) 142/70 (!) 148/90 118/72 (!) 145/72  Pulse: 98 (!) 104 82 75  Resp: 18 18 14 20   Temp: 98.8 F (37.1 C) 98.8 F (37.1 C) 98.6 F (37 C) 98.3 F (36.8 C)  TempSrc: Oral Oral Oral Oral  SpO2: 98% 98% 96% 98%  Weight:      Height:        Physical Exam: General Sleeping calmly, no acute distress  Cardiac Regular rate and rhythm, no murmurs, rubs, or gallops  Pulmonary Clear to auscultation bilaterally without wheezes, rhonchi, or rales  Extremities No peripheral edema    Assessment/Plan:   Principal Problem:   Acute encephalopathy Active Problems:   History of CVA (cerebrovascular accident)   Dementia (HCC)   Hyperthyroidism   Acute delirium  Patient is an 86 year old female with past medical history significant for dementia, right PCA stroke, hypertension, PE after knee replacement who presented with difficulty finding words, confusion, slurred speech, and decreased functional status.  # Acute encephalopathy: CT and MRI did not show any evidence for acute stroke but MR was with motion degradation.  Neurology team has signed off.  There is concern for possible thyrotoxicosis causing patient's altered mental status. Patient is afebrile, WBC WNL, without infectious symptoms. Getting RAIU scan, if without finding to explain symptoms may consider repeat MR to check for CVA given that first MR with motion degradation. *RAIU scan - tracer administered at 8am, will have 4hr and 24hr images taken.  *Continue Seroquel 12.5 mg every evening *Continue aspirin 81 mg daily *Continue atorvastatin 40 mg daily *Delirium  precautions  # Hyperthyroidism: Free T4 1.41, TSH 0.016.  Ultrasound of the thyroid limited due to patient being agitated.  Ultrasound demonstrates mildly heterogenous parenchyma with scattered subcentimeter benign colloid cyst in right lobe. *Radioactive iodine uptake scan today and tomorrow *Thyrotropin receptor antibody pending  # Hypertension: Continue amlodipine 10 mg daily # Hyperlipidemia: Continue atorvastatin 40 mg daily # Macrocytosis: MMA pending   PT/OT: Recommend CIR but patient not candidate TOC: HH PT, aide ordered. DME wheelchair cushion, rolling walker, 3 N 1 ordered Diet: Regular DVT Ppx: Lovenox 40 mg daily Admit Status: Inpatient Dispo: Anticipated discharge pending further medical workup  83, MD 03/04/2019, 6:21 AM

## 2019-03-04 NOTE — TOC Transition Note (Signed)
Transition of Care [facility]) - CM/SW Discharge Note   Patient Details  Name: Jennifer Owen MRN: [id number] Date of Birth: May 29, 1931  [facility] CM/SW Contact:  Kermit Balo, RN Phone Number: 03/04/2019, 4:24 PM   Clinical Narrative:    Plan is for patient to d/c home when medically ready. CM provided choice for [facility] and [facility] decided on. Cory with [facility] accepted the referral.  Pt with orders for DME. DME to be delivered to the room per [employer]. Pt's daughter to provide needed care at home and transportation to home.    Final next level of care: Home w Home Health Services Barriers to Discharge: No Barriers Identified   Patient Goals and CMS Choice Patient states their goals for this hospitalization and ongoing recovery are:: patient unable to participate in goal setting at this time CMS Medicare.gov Compare Post Acute Care list provided to:: Patient Represenative (must comment) Choice offered to / list presented to : Adult Children  Discharge Placement                       Discharge Plan and Services     Post Acute Care Choice: Home Health, Durable Medical Equipment          DME Arranged: 3-N-1, Walker rolling, Wheelchair manual DME Agency: [employer] Date DME Agency Contacted: 03/04/19   Representative spoke with at DME Agency: Timothy Lasso HH Arranged: PT, Nurse's Aide HH Agency: [facility] Date [facility] Agency Contacted: 03/04/19   Representative spoke with at [facility] Agency: Kandee Keen  Social Determinants of Health (SDOH) Interventions     Readmission Risk Interventions No flowsheet data found.

## 2019-03-04 NOTE — Plan of Care (Signed)
  Problem: Coping: Goal: Level of anxiety will decrease Outcome: Progressing   Problem: Pain Managment: Goal: General experience of comfort will improve Outcome: Progressing   Problem: Safety: Goal: Ability to remain free from injury will improve 03/04/2019 1239 by Thom Chimes, RN Outcome: Progressing 03/04/2019 1239 by Thom Chimes, RN Outcome: Progressing   Problem: Skin Integrity: Goal: Risk for impaired skin integrity will decrease 03/04/2019 1239 by Thom Chimes, RN Outcome: Progressing 03/04/2019 1239 by Thom Chimes, RN Outcome: Progressing

## 2019-03-05 ENCOUNTER — Inpatient Hospital Stay (HOSPITAL_COMMUNITY): Payer: Medicare Other

## 2019-03-05 MED ORDER — METHIMAZOLE 10 MG PO TABS
10.0000 mg | ORAL_TABLET | Freq: Every day | ORAL | Status: DC
  Administered 2019-03-05 – 2019-03-06 (×2): 10 mg via ORAL
  Filled 2019-03-05 (×2): qty 1

## 2019-03-05 MED ORDER — QUETIAPINE FUMARATE 25 MG PO TABS
25.0000 mg | ORAL_TABLET | Freq: Every day | ORAL | Status: DC
  Administered 2019-03-05: 21:00:00 25 mg via ORAL
  Filled 2019-03-05: qty 1

## 2019-03-05 NOTE — Progress Notes (Signed)
Physical Therapy Treatment Patient Details Name: Jennifer Owen MRN: [id number] DOB: 06-02-1931 Today's Date: 03/05/2019    History of Present Illness Pt is a 86 y.o. F with significant PMH of R PCA stroke, hypertension, PE after knee replacement who presents with 2 day history of word finding difficulty, confusion, slurred speech and inability to perform ADL's. CT with no acute abnormality. MRI negative for acute infarct but she does have a chronic right occipital lobe infarct    PT Comments    Pt supine sleeping soundly in bed.  Daughter reports to attempt therapy but did let PTA know she was up all night last night.  Pt very lethargic during mobility.  She did open her eyes briefly but unable to advance mobility at this time.  Pt continues to benefit from aggressive rehab in a post acute setting before returning home.  Daughter is adamant now to return patient home with support from her family.  PTA did educated on parts of WC to daughter for ease wiith home use.   She was able to teach back method to PTA.     Follow Up Recommendations  CIR;Supervision/Assistance - 24 hour     Equipment Recommendations  Rolling walker with 5" wheels;3in1 (PT);Wheelchair (measurements PT);Wheelchair cushion (measurements PT)    Recommendations for Other Services       Precautions / Restrictions Precautions Precautions: Fall;Other (comment) Precaution Comments: Restless Restrictions Weight Bearing Restrictions: No    Mobility  Bed Mobility Overal bed mobility: Needs Assistance Bed Mobility: Supine to Sit     Supine to sit: Total assist;+2 for physical assistance Sit to supine: Total assist;+2 for physical assistance   General bed mobility comments: Total assistance for all asepcts of mobility with lethargic presentation.  She briefly opened her eyes but unable to follow commands to participate in session.  Transfers Overall transfer level: (unable this session due to presentation.)                   Ambulation/Gait                 Psychologist, counselling mobility: Yes Wheelchair Assistance Details (indicate cue type and reason): Daughter present and reviewed parts of WC this session for home use.  She demonstrated use of foot rests with teach back method.  Pt verbalized understanding of all other functions including: brakes, drive wheels. removing foot rest and replacing foot rests, folding up chair and opening chair for use, and cushion use.  Modified Rankin (Stroke Patients Only) Modified Rankin (Stroke Patients Only) Pre-Morbid Rankin Score: Moderate disability Modified Rankin: Severe disability     Balance                                            Cognition Arousal/Alertness: Lethargic;Suspect due to medications Behavior During Therapy: Flat affect Overall Cognitive Status: History of cognitive impairments - at baseline                                 General Comments: Pt sleeping on arrival and required increased effort to rouse for PT session,  Performed supine to sit and pt briefly able to hold balance unsupported with eyes closed.  Once returned to supine she did  moan and sit forward but this was all she could do this session. Her daughter reports she was up all evening.      Exercises      General Comments        Pertinent Vitals/Pain Pain Assessment: Faces Faces Pain Scale: No hurt    Home Living                      Prior Function            PT Goals (current goals can now be found in the care plan section) Acute Rehab PT Goals Patient Stated Goal: Daughter adamant to take patient home. Potential to Achieve Goals: Fair Progress towards PT goals: Progressing toward goals    Frequency    Min 3X/week      PT Plan Current plan remains appropriate    Co-evaluation              AM-PAC PT "6 Clicks" Mobility    Outcome Measure  Help needed turning from your back to your side while in a flat bed without using bedrails?: Total Help needed moving from lying on your back to sitting on the side of a flat bed without using bedrails?: Total Help needed moving to and from a bed to a chair (including a wheelchair)?: Total Help needed standing up from a chair using your arms (e.g., wheelchair or bedside chair)?: Total Help needed to walk in hospital room?: Total Help needed climbing 3-5 steps with a railing? : Total 6 Click Score: 6    End of Session   Activity Tolerance: Patient limited by lethargy Patient left: in bed;with call bell/phone within reach;with bed alarm set;with nursing/sitter in room;with family/visitor present Nurse Communication: Mobility status PT Visit Diagnosis: Unsteadiness on feet (R26.81);Difficulty in walking, not elsewhere classified (R26.2)     Time: 1212-1228 PT Time Calculation (min) (ACUTE ONLY): 16 min  Charges:  $Therapeutic Activity: 8-22 mins                     Bonney Leitz , PTA Acute Rehabilitation Services Pager [phone] Office [phone]     Broderick Fonseca Artis Delay 03/05/2019, 2:45 PM

## 2019-03-05 NOTE — Progress Notes (Signed)
Occupational Therapy Treatment Patient Details Name: Jennifer Owen MRN: [id number] DOB: [birth year] Today's Date: 03/05/2019    History of present illness Pt is a 86 y.o. F with significant PMH of R PCA stroke, hypertension, PE after knee replacement who presents with 2 day history of word finding difficulty, confusion, slurred speech and inability to perform ADL's. CT with no acute abnormality. MRI negative for acute infarct but she does have a chronic right occipital lobe infarct   OT comments  Pt making steady progress towards OT goals this session. Pt presents with cognitive deficits, chronic back pain, decreased activity tolerance and generalized weakness limiting ability to engage in ADLs. Session focus on functional mobility with RW and toilet transfer to [facility]. Overall, pt requires MIN A - min guard for functional mobility with RW needing cues to manage RW. Pt presents with baseline flexed posture during ambulation needing cues to maintain close proximity to RW during mobility.  Education provided to dtr during session on managing DME at home as well as managing progressing dementia at home. Dtr reports she and family feel capable of caring for pt at home. Will follow acutely.    Follow Up Recommendations  CIR;Supervision/Assistance - 24 hour    Equipment Recommendations  Other (comment)(TBD)    Recommendations for Other Services      Precautions / Restrictions Precautions Precautions: Fall Precaution Comments: Restless Restrictions Weight Bearing Restrictions: No       Mobility Bed Mobility Overal bed mobility: Needs Assistance Bed Mobility: Supine to Sit     Supine to sit: +2 for safety/equipment;HOB elevated;Min guard Sit to supine: Total assist;+2 for physical assistance   General bed mobility comments: pt required multimodal cues to initiate bed mobility ultimately requiring MIN guard +2 for safety to transition supine>sit with HOB elevated  Transfers Overall  transfer level: Needs assistance Equipment used: Rolling walker (2 wheeled) Transfers: Sit to/from Stand Sit to Stand: Min guard;+2 safety/equipment         General transfer comment: min guard +2 for safety needing cues for hand placement    Balance                                           ADL either performed or assessed with clinical judgement   ADL Overall ADL's : Needs assistance/impaired   Eating/Feeding Details (indicate cue type and reason): dtr reports pt having difficulty with self feeding; education provided on benefits of finger food to assist with self feeding and initially providing [place] assist                     Toilet Transfer: Minimal assistance;+2 for physical assistance;+2 for safety/equipment;RW;Ambulation Toilet Transfer Details (indicate cue type and reason): min a +2 for safety to ambulate to BSC from EOB. pt required multimodal cues to manage RW and for hand placement during txfrs       Tub/Shower Transfer Details (indicate cue type and reason): dtr reports tub bench at home Functional mobility during ADLs: Minimal assistance;+2 for physical assistance;+2 for safety/equipment;Rolling walker General ADL Comments: pt presents with cognitive deficits, chronic back pain, decreased activity tolerance and generalized weakness limiting ability to engage in ADLs. session focus on functional mobility and toilet transfers and family ed for DC home     Vision       Perception     Praxis  Cognition Arousal/Alertness: Awake/alert Behavior During Therapy: WFL for tasks assessed/performed Overall Cognitive Status: History of cognitive impairments - at baseline                                 General Comments: Pt sleeping on arrival and required increased effort to rouse for PT session,  Performed supine to sit and pt briefly able to hold balance unsupported with eyes closed.  Once returned to supine she did moan and sit  forward but this was all she could do this session. Her daughter reports she was up all evening.        Exercises     Shoulder Instructions       General Comments dtr present during session, provided education on managing progressive dementia at home and managing DME    Pertinent Vitals/ Pain       Pain Assessment: Faces Faces Pain Scale: Hurts a little bit Pain Location: back with mobility Pain Descriptors / Indicators: Discomfort Pain Intervention(s): Monitored during session;Repositioned  Home Living                                          Prior Functioning/Environment              Frequency  Min 2X/week        Progress Toward Goals  OT Goals(current goals can now be found in the care plan section)  Progress towards OT goals: Progressing toward goals  Acute Rehab OT Goals Patient Stated Goal: Daughter adamant to take patient home. OT Goal Formulation: With family Time For Goal Achievement: 03/15/19 Potential to Achieve Goals: Fair  Plan Discharge plan remains appropriate    Co-evaluation                 AM-PAC OT "6 Clicks" Daily Activity     Outcome Measure   Help from another person eating meals?: A Lot Help from another person taking care of personal grooming?: A Lot Help from another person toileting, which includes using toliet, bedpan, or urinal?: A Lot Help from another person bathing (including washing, rinsing, drying)?: A Lot Help from another person to put on and taking off regular upper body clothing?: A Lot Help from another person to put on and taking off regular lower body clothing?: Total 6 Click Score: 11    End of Session Equipment Utilized During Treatment: Gait belt;Rolling walker  OT Visit Diagnosis: Unsteadiness on feet (R26.81);Other abnormalities of gait and mobility (R26.89);Muscle weakness (generalized) (M62.81);Low vision, both eyes (H54.2)   Activity Tolerance Patient tolerated treatment well    Patient Left in chair;with call bell/phone within reach;with chair alarm set(chair alarm safety belt)   Nurse Communication          Time: 3299-2426 OT Time Calculation (min): 32 min  Charges: OT General Charges $OT Visit: 1 Visit OT Treatments $Self Care/Home Management : 23-37 mins  Audery Amel., COTA/L Acute Rehabilitation Services [phone]    Angelina Pih 03/05/2019, 4:38 PM

## 2019-03-05 NOTE — Progress Notes (Signed)
  Subjective:  Patient seen at bedside. Patient responsive to voice, unable to answer questions appropriately 2/2 mental status.  Objective:    Vital Signs (last 24 hours): Vitals:   03/04/19 1541 03/04/19 1944 03/05/19 0010 03/05/19 0320  BP: (!) 145/74 138/65 137/63 139/67  Pulse: 78 92 81 92  Resp: 18 16 18 17   Temp: 98.6 F (37 C) 98 F (36.7 C) 98.1 F (36.7 C) 98.6 F (37 C)  TempSrc: Oral Oral Oral Oral  SpO2: 99% 99% 100% 98%  Weight:      Height:        Physical Exam: General Resting in bed, no acute distress  Pulmonary Breathing comfortably on room air, no cough, no distress   Gastroinestinal Soft, non-tender  Neurology Responds to questions vocally, no gross deficit     Assessment/Plan:   Principal Problem:   Acute encephalopathy Active Problems:   History of CVA (cerebrovascular accident)   Dementia (HCC)   Hyperthyroidism   Acute delirium  Patient is an 86 year old female with past medical history significant for dementia, right PCA stroke, hypertension, PE after knee replacement who presented with difficulty finding words, confusion, slurred speech, and decreased functional status.  # Hyperthyroidism: # Acute encephalopathy: CT and MRI did not show evidence of acute stroke but MRI was with motion degradation.  Patient is afebrile, white blood cell count within normal limits, without infectious symptoms.  Neurology team is signed off.  There is concern for possible thyrotoxicosis causing patient's altered mental status. Free T4 1.41, TSH 0.016. Thyrotropin receptor antibody < 1.10. RAIU scan shows small warm nodule at superior pole of right thyroid lobe. *Continue Seroquel 25 mg every evening - increased from 12.5 mg *Start methimazole 10 mg daily *Continue aspirin 81 mg daily *Continue atorvastatin 40 mg daily *Delirium precautions  # Hypertension: Continue amlodipine 10 mg daily # Hyperlipidemia: Continue atorvastatin 40 mg daily # Macrocytosis: B12  and MMA level WNL  PT/OT: Recommend CIR but patient not candidate TOC: HH PT, aide ordered. DME wheelchair cushion, rolling walker, 3 N 1 ordered Diet: Regular DVT Ppx: Lovenox 40 mg daily Admit Status: Inpatient Dispo: Anticipated discharge pending further medical workup  83, MD 03/05/2019, 6:56 AM

## 2019-03-06 DIAGNOSIS — Z885 Allergy status to narcotic agent status: Secondary | ICD-10-CM

## 2019-03-06 DIAGNOSIS — E041 Nontoxic single thyroid nodule: Secondary | ICD-10-CM

## 2019-03-06 MED ORDER — QUETIAPINE FUMARATE 25 MG PO TABS: 25.0000 mg | ORAL_TABLET | Freq: Every day | ORAL | 0 refills | Status: DC

## 2019-03-06 MED ORDER — METHIMAZOLE 10 MG PO TABS: 10.0000 mg | ORAL_TABLET | Freq: Every day | ORAL | 0 refills | Status: DC

## 2019-03-06 NOTE — Progress Notes (Signed)
Discharge instructions reviewed with pt's daughter Drenda Freeze. Handouts given on stroke ; although pt was not positive for a stroke this admit (old CVA), discussed signs and symptoms of a stroke and calling 911. Also given handout on hyperthyroidism and discussed with pt's daughter.  Copy of instructions given to daughter, pt scripts sent to her pharmacy, daughter made aware.   Pt will transport home via [facility].  CM has made arrangements with [facility] for pickup later this afternoon.  Daughter aware, pt equipment in room, wheelchair with cushion, walker and 3 in 1 BSC.  Daughter will take equipment to her car to transport it home.

## 2019-03-06 NOTE — Progress Notes (Signed)
[facility] here for pt transport home. Pt d/c'd via stretcher with [facility]. Pt belongings already taken home by pt's daughter.

## 2019-03-06 NOTE — Progress Notes (Signed)
Pt's daughter, Drenda Freeze  did take pt's equipment home, walker, 3 in 1, and wheelchair with cushion seat with her and other pt belongings when she left at this time to go home. She will meet [facility] at their home when they arrive.  Waiting [facility] arrival.  Pt resting in bed.

## 2019-03-06 NOTE — Progress Notes (Signed)
  Subjective:  Patient was seen sitting up in her bed. She was talkative this morning and told us about how she had a dream about the physician team.   Objective:    Vital Signs (last 24 hours): Vitals:   03/05/19 0827 03/05/19 1950 03/05/19 2358 03/06/19 0415  BP: (!) 148/89 (!) 147/77 111/65 133/65  Pulse: 87 99 79 69  Resp: 20 17 18 16   Temp: (!) 97.5 F (36.4 C) 98.4 F (36.9 C) 98 F (36.7 C) 98 F (36.7 C)  TempSrc: Oral Oral Oral Axillary  SpO2: 100% 100% 98% 99%  Weight:      Height:       Physical Exam: General Resting in bed, no acute distress  Neuro Responds to questions, no gross deficit  Pulmonary Breathing comfortably on room air, no cough, no distress  Extremities No peripheral edema   Assessment/Plan:   Principal Problem:   Acute encephalopathy Active Problems:   History of CVA (cerebrovascular accident)   Dementia (HCC)   Hyperthyroidism   Acute delirium  Patient is an 86 year old female with past medical history significant for dementia, right PCA stroke, hypertension, PE after knee replacement who presented with difficulty finding words, confusion, slurred speech, and decreased functional status.  # Hyperthyroidism: # Acute encephalopathy: CT and MRI did not show evidence of acute stroke but MRI was with motion degradation.  Patient is afebrile, white blood cell count within normal limits, without infectious symptoms.  Neurology team signed off.  Patient's altered mental status may be 2/2 to hyperthyroidism from warm nodule seen on RAIU scan - RAI uptake scan showed warm nodule and 1-2 cold nodules. Free T4 1.41, TSH 0.016. Thyrotropin receptor antibody < 1.10. Plan for discharge today with PCP followup *Continue Seroquel 25 mg every evening - increased from 12.5 mg yesterday *Continue methimazole 10 mg daily *Continue aspirin 81 mg daily *Continue atorvastatin 40 mg daily *Delirium precautions  # Hypertension: Continue amlodipine 10 mg daily #  Hyperlipidemia: Continue atorvastatin 40 mg daily # Macrocytosis: B12 and MMA level WNL  PT/OT: Recommend CIR, but patient not candidate TOC: HH PT, aide ordered. DME wheelchair cushion, rolling walker, 3 N 1 ordered Diet: Regular DVT Ppx: Lovenox 40 mg daily Admit Status: Inpatient Dispo: Anticipated discharge today  83, MD 03/06/2019, 6:05 AM

## 2019-03-06 NOTE — TOC Transition Note (Signed)
Transition of Care [facility]) - CM/SW Discharge Note   Patient Details  Name: Jennifer Owen MRN: [id number] Date of Birth: 03-21-1931  Transition of Care [facility] CM/SW Contact:  Beckie Busing, RN Phone Number: 03/06/2019, 12:06 PM   Clinical Narrative:    Patient discharging home with home health through [facility]. Cory with [facility] aware of discharge. DME has been delivered to the room. Daughter requesting ambulance transport home. [facility] arranged. Bedside nurse updated and discharge packet is at the desk.   Final next level of care: Home w Home Health Services Barriers to Discharge: No Barriers Identified   Patient Goals and CMS Choice Patient states their goals for this hospitalization and ongoing recovery are:: patient unable to participate in goal setting at this time CMS Medicare.gov Compare Post Acute Care list provided to:: Patient Represenative (must comment)(Daughter) Choice offered to / list presented to : Adult Children  Discharge Placement                       Discharge Plan and Services     Post Acute Care Choice: Home Health, Durable Medical Equipment          DME Arranged: 3-N-1, Dan Humphreys, Wheelchair manual DME Agency: [facility] Date DME Agency Contacted: 03/05/19   Representative spoke with at DME Agency: [facility] HH Arranged: PT, Nurse's Aide HH Agency: [facility] Date [facility] Agency Contacted: 03/05/19   Representative spoke with at [facility] Agency: Kandee Keen  Social Determinants of Health (SDOH) Interventions     Readmission Risk Interventions No flowsheet data found.

## 2019-03-09 NOTE — Discharge Summary (Addendum)
Name: Jennifer Owen MRN: [id number] DOB: May 06, 1931 86 y.o. PCP: Delice Bison, DO  Date of Admission: 02/27/2019 11:49 AM Date of Discharge: 03/06/2019 Attending Physician: Dr. Lenice Pressman  Discharge Diagnosis: 1. Acute encephalopathy 2. Hyperthyroidism  Discharge Medications: Allergies as of 03/06/2019      Reactions   Tramadol Hcl Nausea Only   stomach irritation      Medication List    STOP taking these medications   lisinopril 40 MG tablet Commonly known as: ZESTRIL     TAKE these medications   acetaminophen 500 MG tablet Commonly known as: TYLENOL Take 500 mg by mouth every 6 (six) hours as needed for mild pain.   amLODipine 10 MG tablet Commonly known as: NORVASC TAKE 1 TABLET BY MOUTH DAILY.   aspirin 81 MG chewable tablet Chew 81 mg by mouth daily.   atorvastatin 40 MG tablet Commonly known as: LIPITOR TAKE 1 TABLET BY MOUTH DAILY.   diclofenac sodium 1 % Gel Commonly known as: Voltaren Apply 2 g upto 4 times a day to your knees as needed for knee pain. What changed:   how much to take  how to take this  when to take this  additional instructions   esomeprazole 40 MG capsule Commonly known as: NEXIUM TAKE 1 CAPSULE BY [place]. What changed: See the new instructions.   hydrochlorothiazide 25 MG tablet Commonly known as: HYDRODIURIL TAKE 1 TABLET BY MOUTH DAILY.   memantine 5 MG tablet Commonly known as: NAMENDA TAKE 1 TABLET BY MOUTH 2 TIMES DAILY. What changed: when to take this   methimazole 10 MG tablet Commonly known as: TAPAZOLE Take 1 tablet (10 mg total) by mouth daily.   QUEtiapine 25 MG tablet Commonly known as: SEROQUEL Take 1 tablet (25 mg total) by mouth at bedtime.       Disposition and follow-up:   Jennifer Owen was discharged from [facility] in Stable condition.  At the hospital follow up visit please address:  1.  Please consider endocrine referral  for management of hyperfunctioning thyroid nodule. Patient was started on methimazole in hospital. Please assess for benefit. Patient is also with cold nodules on RAIU scan. Consider further ultrasound workup/evaluation for malignancy.  2.  Labs / imaging needed at time of follow-up: T4/TSH should be checked in about 6 weeks to assess for improvement  3.  Pending labs/ test needing follow-up: None  Follow-up Appointments: Follow-up Information    [facility] Follow up.   Specialty: Home Health Services Why: The home health agency will contact you for the first home visit. Contact information: [place] [address] [phone]        Modena Nunnery D, DO Follow up.   Specialty: Internal Medicine Why: Please call for a followup appointment to occur early next week Contact information: [address] [address] [phone]           Hospital Course by problem list:  # Hyperthyroidism # Thyroid nodules # Acute encephalopathy Patient is an 86 year old female with past medical history significant for dementia, right PCA, stroke, hypertension, PE after knee replacement who presented with difficulty finding words, confusion, slurred speech, and decreased functional status.  CT and MRI did not show evidence of acute stroke, but MRI was with motion degradation.  Patient remained afebrile, white blood cell count within normal limits, without infectious symptoms.  Neurology evaluated patient, assessed etiology to be unlikely  secondary to acute stroke or seizure.    Patient did have elevated free T4 at 1.41 and low TSH at 0.016.  Thyroid ultrasound was conducted, demonstrated benign colloid cysts, no nodules noted, but evaluation of left lobe was limited due to patient's mental status.  In consideration of Graves' disease, thyrotropin receptor antibody test was conducted and was negative.  Radioactive iodine uptake study was  performed and demonstrated a small warm nodule at the superior pole of the right thyroid lobe and a cold nodule at inferior right lobe, and possibly in the inferior left lobe.  Due to presence of hyperfunctioning nodule, patient was started on methimazole 10 mg daily.  To manage dementia symptoms and help patient sleep, patient was started on Seroquel 25 mg every evening.  With symptomatic improvement, patient was discharged with home health physical therapy and aide.  # Macrocytosis: Patient with low/borderline-normal hemoglobin and macrocytosis.  Vitamin B12 and MMA levels within normal limits.  Discharge Vitals:   BP 121/66 (BP Location: Right Arm)   Pulse 72   Temp 98.2 F (36.8 C) (Axillary)   Resp 20   Ht 5\' 6"  (1.676 m)   Wt 87 kg   SpO2 100%   BMI 30.96 kg/m   Pertinent Labs, Studies, and Procedures:  Lab Results  Component Value Date   TSH 0.016 (L) 02/27/2019   FREET4 1.41 (H) 02/28/2019   Vitamin B12: 414 Methylmalonic Acid: 109 RPR: Non-reactive  CBC Latest Ref Rng & Units 03/01/2019 02/28/2019 02/27/2019  WBC 4.0 - 10.5 K/uL 4.9 - 4.1  Hemoglobin 12.0 - 15.0 g/dL 11.6(L) 11.9(L) 12.1  Hematocrit 36.0 - 46.0 % 34.9(L) 35.0(L) 37.8  Platelets 150 - 400 K/uL 211 - 224   BMP Latest Ref Rng & Units 03/01/2019 02/28/2019 02/28/2019  Glucose 70 - 99 mg/dL 94 89 -  BUN 8 - 23 mg/dL 19 23 -  Creatinine 03/02/2019 - 1.00 mg/dL 8.41 6.60 -  BUN/Creat Ratio 12 - 28 - - -  Sodium 135 - 145 mmol/L 142 142 141  Potassium 3.5 - 5.1 mmol/L 4.3 3.6 3.6  Chloride 98 - 111 mmol/L 109 109 -  CO2 22 - 32 mmol/L 24 23 -  Calcium 8.9 - 10.3 mg/dL 6.30 16.0 -   MR Brain, MR Angio Head (03/01/2019): IMPRESSION: MRI brain:  1. Significantly motion degraded and limited examination as described. The axial diffusion-weighted imaging is of good quality. A small curvilinear focus of diffusion weighted hyperintensity within a chronic right occipital lobe infarct is suspected related to chronic  petechial hemorrhage at this site, as described. No convincing evidence of acute infarct or other acute intracranial abnormality. 2. Moderate generalized parenchymal atrophy with mild chronic small vessel ischemic disease. 3. Inferiorly projecting small skull base CSF intensity arachnoid cysts versus meningoceles, similar to prior MRI 10/06/2011. 4. Large right maxillary sinus mucous retention cyst.  MRA head:  1. Moderate to severely motion degraded exam. This limits evaluation for stenoses and small aneurysms. 2. No anterior circulation proximal large vessel occlusion. 3. Atherosclerotic irregularity of the intracranial internal carotid arteries. Suspected moderate to moderately advanced focal stenosis within the cavernous right ICA. Sites of up to moderate stenosis within the cavernous left ICA. 4. Apparent moderate/severe focal stenosis within the distal left vertebral artery, just proximal to the vertebrobasilar junction. 5. The right posterior cerebral artery is poorly delineated shortly beyond its origin and may be chronically occluded.  CT Head WO Contrast (02/27/2019): IMPRESSION: 1. No CT evidence of acute intracranial process.  2. Encephalomalacia within the right occipital lobe compatible with a remote right PCA distribution infarct.  NM Thyroid Mult Uptake (03/04/2019): IMPRESSION: Decreased 4 hour and 24 hour radio iodine uptakes as above.  Suspected small warm nodule at superior pole RIGHT thyroid lobe and dominant cold nodule at inferior RIGHT lobe, questionably inferior LEFT lobe as well; follow-up thyroid ultrasound recommended to exclude thyroid neoplasm.  Thyroid Ultrasound (03/01/2019): FINDINGS: Parenchymal Echotexture: Mildly heterogenous  Isthmus: 0.5 cm thickness  Right lobe: 4.2 x 2.1 x 1.8 cm  Left lobe: Not documented  _________________________________________________________  Estimated total number of nodules >/= 1 cm: 0  Number  of spongiform nodules >/=  2 cm not described below (TR1): 0  Number of mixed cystic and solid nodules >/= 1.5 cm not described below (TR2): 0  _________________________________________________________  Scattered subcentimeter benign colloid cysts are noted in the right lobe.  No significant evaluation of the left lobe as detailed above.  IMPRESSION: Incomplete study due to patient issues as above. No worrisome lesion demonstrated.  Discharge Instructions: Discharge Instructions    Call MD for:  difficulty breathing, headache or visual disturbances   Complete by: As directed    Call MD for:  persistant dizziness or light-headedness   Complete by: As directed    Diet - low sodium heart healthy   Complete by: As directed    Discharge instructions   Complete by: As directed    You were seen in the hospital for confusion. You were found to have hyperthyroidism and you were started on methimazole to help control your symptoms.  We have also started you on Seroquel to take at night to help with sleep.  We want you to continue taking your amlodipine and hydrochlorothiazide but hold your lisinopril for now.  At your follow-up appointment, your blood pressures may be increased, and you can be restarted on lisinopril.  We want you to follow-up with your primary care doctor where they can check your blood pressure and consider restarting and lisinopril.  Thank you for allowing [place] to be part of your medical care!   Increase activity slowly   Complete by: As directed       Signed: Katherine Roan, MD 03/09/2019, 7:06 PM

## 2019-03-11 ENCOUNTER — Telehealth: Payer: Self-pay | Admitting: *Deleted

## 2019-03-11 ENCOUNTER — Ambulatory Visit (INDEPENDENT_AMBULATORY_CARE_PROVIDER_SITE_OTHER): Payer: Medicare Other | Admitting: Internal Medicine

## 2019-03-11 ENCOUNTER — Other Ambulatory Visit: Payer: Self-pay

## 2019-03-11 ENCOUNTER — Encounter: Payer: Self-pay | Admitting: Internal Medicine

## 2019-03-11 VITALS — BP 108/81 | HR 99 | Temp 98.4°F | Ht 66.0 in | Wt 173.8 lb

## 2019-03-11 DIAGNOSIS — Z79899 Other long term (current) drug therapy: Secondary | ICD-10-CM | POA: Diagnosis not present

## 2019-03-11 DIAGNOSIS — F02818 Dementia in other diseases classified elsewhere, unspecified severity, with other behavioral disturbance: Secondary | ICD-10-CM

## 2019-03-11 DIAGNOSIS — F0390 Unspecified dementia without behavioral disturbance: Secondary | ICD-10-CM | POA: Diagnosis not present

## 2019-03-11 DIAGNOSIS — I10 Essential (primary) hypertension: Secondary | ICD-10-CM | POA: Diagnosis not present

## 2019-03-11 DIAGNOSIS — E0590 Thyrotoxicosis, unspecified without thyrotoxic crisis or storm: Secondary | ICD-10-CM | POA: Diagnosis not present

## 2019-03-11 DIAGNOSIS — E039 Hypothyroidism, unspecified: Secondary | ICD-10-CM | POA: Insufficient documentation

## 2019-03-11 DIAGNOSIS — F0281 Dementia in other diseases classified elsewhere with behavioral disturbance: Secondary | ICD-10-CM

## 2019-03-11 MED ORDER — QUETIAPINE FUMARATE 25 MG PO TABS: 50.0000 mg | ORAL_TABLET | Freq: Every day | ORAL | 2 refills | Status: AC

## 2019-03-11 MED ORDER — AMLODIPINE BESYLATE 10 MG PO TABS: 10.0000 mg | ORAL_TABLET | Freq: Every day | ORAL | 0 refills | Status: DC

## 2019-03-11 NOTE — Telephone Encounter (Signed)
HH PT 2x weeks for 3 weeks 1x week for 3 weeks For balance, gait, safety Request speech therapy for eval and treat for change in speech and swallowing PT start of care was Friday 2/26 VO given, do you agree?

## 2019-03-11 NOTE — Assessment & Plan Note (Signed)
Admitted for AMS with T4 elevated 1.41 on admission.  Etiology uncertain.  Patient had NM thyroid uptake scan during admission and noted to have a warm nodule with multiple cold nodules concerning for cyst.  She will likely need an ultrasound to investigate these at some point.  No current improvement on therapy to date. Plan: Repeat fT4 and TSH ~ April 10th +/- a week Continue methimazole 10 mg daily for now

## 2019-03-11 NOTE — Patient Instructions (Signed)
FOLLOW-UP INSTRUCTIONS When: 4-6 wks For: TSH and fT4 lab work What to bring: All of your medications  I have not made any changes to your medications today.   Today we discussed your dementia and the progression of such. I have placed referrals to Geriatrics as well as to Care Management for assistance with evaluating the possibility for obtaining respite care vs long term care. Someone will contact you to discuss this at that time.   Thank you for your visit to the [facility] today. If you have any questions or concerns please call us at [phone].

## 2019-03-11 NOTE — Assessment & Plan Note (Signed)
Progressive dementia: The patient was recently discharged for a prolonged admission 2/2 acutely worsening AMS superimposed on dementia with a discharge date of 03/06/19. No clear cause identified but there was some concern that her thyroid abnormalities may be related. No improvement thus far on treatment.   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 She was seen by Neurology who recommended continuing her amanda and starting Seroquel. Per family, the Seroquel 25mg  has not been useful but they have noted improvement in her sleep when they doubled the dose. I advised against doubling the dose without first consulting a physician in the future. The patient's youngest daughter accompanied the patient today and states that the patient is unable to be free from her presence without screaming or being uncomfortable.  The patient is typically pleasant but will have outbursts where she is extremely rude, insulting even at times mildly aggressive or overly assertive.  The patient's daughter was tearful today while describing the level of care required.  She states that she needs a break.  She defined how much she lives her mother but states that she cannot continue to the sole primary caregiver.  Her house is not currently set up to permit wheelchair access which also makes things very difficult.  Plan: -Referral made to chronic care management for respite care versus other assistance -Continue Namenda 5 mg twice daily -Continue quetiapine increased to 50 mg daily at bed -Referral placed to geriatrics

## 2019-03-11 NOTE — Progress Notes (Signed)
   CC: hospital discharge follow-up  HPI:[name] is a 86 y.o. female who presents for evaluation of discharge follow-up. Please see individual problem based A/P for details.  Past Medical History:  Diagnosis Date  . GERD (gastroesophageal reflux disease)    Papulous gastropathy EGD Oct. 2007  . Hyperlipemia   . Hypertension   . IBS (irritable bowel syndrome)   . Low back pain    intermittent radiculopathy  . Osteoarthritis   . Osteopenia    T score-1.1 R [place], - 0.4 L spine  . Polio 1942  . Pulmonary embolism (HCC)    2/2 knee replacement  . Urinary incontinence    Review of Systems:  ROS negative except as per HPI.  Physical Exam: Vitals:   03/11/19 1437  BP: 108/81  Pulse: 99  Temp: 98.4 F (36.9 C)  TempSrc: Oral  SpO2: 100%  Weight: 173 lb 12.8 oz (78.8 kg)  Height: 5\' 6"  (1.676 m)   Filed Weights   03/11/19 1437  Weight: 173 lb 12.8 oz (78.8 kg)   General: In no acute distress, afebrile, nondiaphoretic HEENT: PEERL, EMO intact Cardio: RRR, no mrg's  Pulmonary: CTA bilaterally, no wheezing or crackles  Abdomen: Bowel sounds normal, soft, nontender  MSK: BLE nontender, nonedematous Neuro: Alert to self and location but not to events, conversational, strength 5/5 in the upper and lower extremities bilaterally Psych: Appropriate affect, engages well, continues to fold imaginary close, hallucinates when she is speaking to people who are not present  Assessment & Plan:   See Encounters Tab for problem based charting.  Patient discussed with Dr. 05/11/19

## 2019-03-11 NOTE — Assessment & Plan Note (Signed)
Hypertension: Patient's BP today is 108/81 with a goal of <140/80. The patient endorses adherence to her medication regimen. She denied, chest pain, headache, visual changes, lightheadedness, weakness, dizziness on standing, swelling in the feet or ankles.   Plan: Continue amlodipine 10 mg daily Consider discontinuing antihypertensive therapy given patient's severe dementia

## 2019-03-11 NOTE — Progress Notes (Signed)
Internal Medicine Clinic Attending  Case discussed with Dr. Harbrecht at the time of the visit.  We reviewed the resident's history and exam and pertinent patient test results.  I agree with the assessment, diagnosis, and plan of care documented in the resident's note.   

## 2019-03-12 ENCOUNTER — Ambulatory Visit: Payer: Self-pay

## 2019-03-12 NOTE — Chronic Care Management (AMB) (Signed)
  Chronic Care Management   Outreach Note  03/12/2019 Name: Jennifer Owen MRN: [id number] DOB: 1931-05-01  Referred by: Bridget Hartshorn, DO Reason for referral : Care Coordination (Level of Care)   An unsuccessful telephone outreach was attempted today. The patient was referred to the case management team for assistance with care management and care coordination.   Follow Up Plan: The care management team will reach out to the patient again over the next 7 days.      Malachy Chamber, BSW Embedded Care Coordination Social Worker [facility] [phone]

## 2019-03-12 NOTE — Telephone Encounter (Signed)
I agree, thank you.

## 2019-03-14 ENCOUNTER — Telehealth: Payer: Self-pay | Admitting: *Deleted

## 2019-03-14 NOTE — Telephone Encounter (Signed)
You may reach pt's 2 daughters at these ph#'s Fran [phone] vanessa [phone] Please call asap

## 2019-03-15 ENCOUNTER — Telehealth: Payer: Self-pay

## 2019-03-15 NOTE — Telephone Encounter (Signed)
Incoming call from patient's daughter, Drenda Freeze, in response to voicemail message left on 03/12/19.  Unable to complete assessment at time of call due to being on PAL.  Call scheduled for Monday, 03/18/19.     Malachy Chamber, BSW Embedded Care Coordination Social Worker [facility] [phone]

## 2019-03-18 ENCOUNTER — Ambulatory Visit: Payer: Medicare Other

## 2019-03-18 ENCOUNTER — Ambulatory Visit: Payer: Self-pay | Admitting: *Deleted

## 2019-03-18 DIAGNOSIS — I10 Essential (primary) hypertension: Secondary | ICD-10-CM

## 2019-03-18 DIAGNOSIS — M159 Polyosteoarthritis, unspecified: Secondary | ICD-10-CM

## 2019-03-18 DIAGNOSIS — G308 Other Alzheimer's disease: Secondary | ICD-10-CM

## 2019-03-18 DIAGNOSIS — F0281 Dementia in other diseases classified elsewhere with behavioral disturbance: Secondary | ICD-10-CM

## 2019-03-18 DIAGNOSIS — F02818 Dementia in other diseases classified elsewhere, unspecified severity, with other behavioral disturbance: Secondary | ICD-10-CM

## 2019-03-18 NOTE — Chronic Care Management (AMB) (Signed)
Chronic Care Management   Initial Visit Note  03/18/2019 Name: Jennifer Owen MRN: [id number] DOB: Apr 20, 1931  Referred by: Jennifer Bison, DO Reason for referral : Chronic Care Management (Initial BSW outreach , HTN , dementia)   Jennifer Owen is a 86 y.o. year old female who is a primary care patient of Jennifer Bison, DO. The CCM team was consulted for assistance with chronic disease management and care coordination needs related level of care issues ina patient with HTN and Dementia  Review of patient status, including review of consultants reports, relevant laboratory and other test results, and collaboration with appropriate care team members and the patient's provider was performed as part of comprehensive patient evaluation and provision of chronic care management services.    SDOH (Social Determinants of Health) assessments performed: No See Care Plan activities for detailed interventions related to SDOH)     Medications: Outpatient Encounter Medications as of 03/18/2019  Medication Sig Note  . acetaminophen (TYLENOL) 500 MG tablet Take 500 mg by mouth every 6 (six) hours as needed for mild pain.   Marland Kitchen amLODipine (NORVASC) 10 MG tablet Take 1 tablet (10 mg total) by mouth daily.   Marland Kitchen aspirin 81 MG chewable tablet Chew 81 mg by mouth daily.     Marland Kitchen atorvastatin (LIPITOR) 40 MG tablet TAKE 1 TABLET BY MOUTH DAILY. (Patient taking differently: Take 40 mg by mouth daily. )   . diclofenac sodium (VOLTAREN) 1 % GEL Apply 2 g upto 4 times a day to your knees as needed for knee pain. (Patient taking differently: Apply 2 g topically daily. For knee pain)   . esomeprazole (NEXIUM) 40 MG capsule TAKE 1 CAPSULE BY MOUTH DAILY BEFORE BREAKFAST. (Patient taking differently: Take 40 mg by mouth daily. )   . hydrochlorothiazide (HYDRODIURIL) 25 MG tablet TAKE 1 TABLET BY MOUTH DAILY. (Patient taking differently: Take 25 mg by mouth daily. )   . memantine (NAMENDA) 5 MG tablet TAKE 1  TABLET BY MOUTH 2 TIMES DAILY. (Patient taking differently: Take 5 mg by mouth daily. )   . methimazole (TAPAZOLE) 10 MG tablet Take 1 tablet (10 mg total) by mouth daily.   . QUEtiapine (SEROQUEL) 25 MG tablet Take 2 tablets (50 mg total) by mouth at bedtime.   . [DISCONTINUED] simvastatin (ZOCOR) 20 MG tablet Take 20 mg by mouth every evening. 03/22/2011: LDL 90's   No facility-administered encounter medications on file as of 03/18/2019.     Objective:   Goals Addressed            This Visit's Progress     Patient Stated   . " We need help finding Mom a place to go where she can receive round the clock help" (pt-stated)       CARE PLAN ENTRY (see longitudinal plan of care for additional care plan information)   Current Barriers:  . Chronic Disease Management support, education, and care coordination needs related to HTN and Dementia  Case Manager Clinical Goal(s):  Marland Kitchen Over the next 90 days, patient will work with BSW to address needs related to Level of care concerns . ADL IADL limitations, Cognitive Deficits in patient with HTN and Dementia  Interventions:  . Collaborated with BSW to initiate plan of care to address needs related to Level of care concerns . Cognitive Deficits . Memory Deficits . Inability to perform ADL's independently . Inability to perform IADL's independently in patient with HTN and Dementia  Patient Self Care Activities:  .  Patient and patient's family will  work with care management team in addressing level of care issues  Initial goal documentation         Jennifer Owen was given information about Chronic Care Management services today including:  1. CCM service includes personalized support from designated clinical staff supervised by her physician, including individualized plan of care and coordination with other care providers 2. 24/7 contact phone numbers for assistance for urgent and routine care needs. 3. Service will only be billed when  office clinical staff spend 20 minutes or more in a month to coordinate care. 4. Only one practitioner may furnish and bill the service in a calendar month. 5. The patient may stop CCM services at any time (effective at the end of the month) by phone call to the office staff. 6. The patient will be responsible for cost sharing (co-pay) of up to 20% of the service fee (after annual deductible is met).  Patient agreed to services and verbal consent obtained.   Plan:   The care management team will reach out to the patient again over the next 90 days.   Kelli Churn RN, CCM, [facility] RN Care Manager [phone]

## 2019-03-18 NOTE — Patient Instructions (Signed)
Visit Information  Goals Addressed            This Visit's Progress     Patient Stated   . " We need help finding Mom a place to go where she can receive round the clock help" (pt-stated)       [place] (see longitudinal plan of care for additional care plan information)   Current Barriers:  . Chronic Disease Management support, education, and care coordination needs related to HTN and Dementia  Case Manager Clinical Goal(s):  Marland Kitchen Over the next 90 days, patient will work with BSW to address needs related to Level of care concerns . ADL IADL limitations, Cognitive Deficits in patient with HTN and Dementia  Interventions:  . Collaborated with BSW to initiate plan of care to address needs related to Level of care concerns . Cognitive Deficits . Memory Deficits . Inability to perform ADL's independently . Inability to perform IADL's independently in patient with HTN and Dementia  Patient Self Care Activities:  . Patient and patient's family will  work with care management team in addressing level of care issues  Initial goal documentation        Ms. Giebler was given information about Chronic Care Management services today including:  1. CCM service includes personalized support from designated clinical staff supervised by her physician, including individualized plan of care and coordination with other care providers 2. 24/7 contact phone numbers for assistance for urgent and routine care needs. 3. Service will only be billed when office clinical staff spend 20 minutes or more in a month to coordinate care. 4. Only one practitioner may furnish and bill the service in a calendar month. 5. The patient may stop CCM services at any time (effective at the end of the month) by phone call to the office staff. 6. The patient will be responsible for cost sharing (co-pay) of up to 20% of the service fee (after annual deductible is met).  Patient's family agreed to services and  verbal consent obtained.   The patient's family verbalized understanding of instructions provided today and declined a print copy of patient instruction materials.   The care management team will reach out to the patient again over the next 90 days.   Kelli Churn RN, CCM, [facility] RN Care Manager [phone]

## 2019-03-18 NOTE — Chronic Care Management (AMB) (Signed)
Chronic Care Management    Clinical Social Work CCM Outreach Note  03/18/2019 Name: Jennifer Owen MRN: [id number] DOB: April 29, 1931  Jennifer Owen is a 86 y.o. year old female who is a primary care patient of Jennifer Bison, DO . The CCM team was consulted for assistance with Level of Care Concerns.   LCSW reached out to Jennifer Owen today by phone to introduce and offer CCM services.   [name] was given information about Chronic Care Management services today including:  1. CCM service includes personalized support from designated clinical staff supervised by her physician, including individualized plan of care and coordination with other care providers 2. 24/7 contact phone numbers for assistance for urgent and routine care needs. 3. Service will only be billed when office clinical staff spend 20 minutes or more in a month to coordinate care. 4. Only one practitioner may furnish and bill the service in a calendar month. 5. The patient may stop CCM services at any time (effective at the end of the month) by phone call to the office staff. 6. The patient will be responsible for cost sharing (co-pay) of up to 20% of the service fee (after annual deductible is met).  Patient's Caregiver agreed to services and verbal consent obtained.    Goals    . "Per Caregiver, we are unable to take care of mom at home.  She needs 24 hour care." (pt-stated)     Current Barriers:  Marland Kitchen Knowledge Barriers related to resources and support available to address needs related to Level of care concerns ADL IADL limitations  Case Manager Clinical Goal(s):  Marland Kitchen Over the next 90 days, caregivers will work with BSW to address needs related to Level of care concerns; family seeking long-term placement.   . Over the next 90 days, BSW will collaborate with RN Care Manager to address care management and care coordination needs  Interventions:  . Patient's Caregiver interviewed and appropriate  assessments performed . Collaborated with RN Care Manager and patient to establish an individualized plan of care  . Provided education to patient/caregiver regarding level of care options. . Assessed Caregiver's understanding of current care coordination needs and health conditions.  Marland Kitchen Collaboration with RN Case Manager  . Advised Caregiver to contact DSS Caseworker regarding need for transition to Long-Term Care Medicaid . Mailed Long-Term Care Medicaid application to Caregiver . Mailed list of Memory Care Facilities to Caregiver . Plan to collaborate with provider for completion of [place]  Patient Self Care Activities:  . Attends all scheduled provider appointments  Initial goal documentation     . Blood Pressure < 150/90    . Exercise 3x per week     Before Covid-19 restrictions patient went to church everyday where she was led in exercises while sitting, standing and walking stairs. Patient will begin to do these same exercises at home 3 days per week until she's able to resume with church group.    Marland Kitchen LDL CALC < 130        Follow Up Plan: SW will follow up with caregiver by the end of the week.   Total time spent performing care coordination and/or care management activities with the patient by phone or face to face 30 minutes.   Ronn Melena, [place] Coordination Social Worker [place] [phone]

## 2019-03-18 NOTE — Patient Instructions (Addendum)
Social Worker Visit Information  Goals we discussed today:  Goals Addressed            This Visit's Progress   . "Per Caregiver, we are unable to take care of mom at home.  She needs 24 hour care." (pt-stated)       Current Barriers:  Marland Kitchen Knowledge Barriers related to resources and support available to address needs related to Level of care concerns ADL IADL limitations  Case Manager Clinical Goal(s):  Marland Kitchen Over the next 90 days, caregivers will work with BSW to address needs related to Level of care concerns; family seeking long-term placement.   . Over the next 90 days, BSW will collaborate with RN Care Manager to address care management and care coordination needs  Interventions:  . Patient's Caregiver interviewed and appropriate assessments performed . Collaborated with RN Care Manager and patient to establish an individualized plan of care  . Provided education to patient/caregiver regarding level of care options. . Assessed Caregiver's understanding of current care coordination needs and health conditions.  Marland Kitchen Collaboration with RN Case Manager  . Advised Caregiver to contact DSS Caseworker regarding need for transition to Long-Term Care Medicaid . Mailed Long-Term Care Medicaid application to Caregiver . Mailed list of Memory Care Facilities to Caregiver . Plan to collaborate with provider for completion of [place]  Patient Self Care Activities:  . Attends all scheduled provider appointments  Initial goal documentation         Materials provided: Yes: Level of Care  Ms. Doucet was given information about Chronic Care Management services today including:  1. CCM service includes personalized support from designated clinical staff supervised by her physician, including individualized plan of care and coordination with other care providers 2. 24/7 contact phone numbers for assistance for urgent and routine care needs. 3. Service will only be billed when office clinical staff  spend 20 minutes or more in a month to coordinate care. 4. Only one practitioner may furnish and bill the service in a calendar month. 5. The patient may stop CCM services at any time (effective at the end of the month) by phone call to the office staff. 6. The patient will be responsible for cost sharing (co-pay) of up to 20% of the service fee (after annual deductible is met).  Patient agreed to services and verbal consent obtained.   The patient verbalized understanding of instructions provided today and declined a print copy of patient instruction materials.   Follow up plan: SW will follow up with caregiver by phone by the end of the week.         Ronn Melena, [place] Coordination Social Worker [place] [phone]

## 2019-03-21 NOTE — Addendum Note (Signed)
Addended by: Evelena Leyden on: 03/21/2019 01:11 PM   Modules accepted: Orders

## 2019-03-22 ENCOUNTER — Ambulatory Visit: Payer: Medicare Other

## 2019-03-22 DIAGNOSIS — F0281 Dementia in other diseases classified elsewhere with behavioral disturbance: Secondary | ICD-10-CM

## 2019-03-22 DIAGNOSIS — I10 Essential (primary) hypertension: Secondary | ICD-10-CM

## 2019-03-22 DIAGNOSIS — F02818 Dementia in other diseases classified elsewhere, unspecified severity, with other behavioral disturbance: Secondary | ICD-10-CM

## 2019-03-22 NOTE — Patient Instructions (Signed)
Visit Information  Goals Addressed            This Visit's Progress   . "Per Caregiver, we are unable to take care of mom at home.  She needs 24 hour care." (pt-stated)       Current Barriers:  Marland Kitchen Knowledge Barriers related to resources and support available to address needs related to Level of care concerns ADL IADL limitations  Case Manager Clinical Goal(s):  Marland Kitchen Over the next 90 days, caregivers will work with BSW to address needs related to Level of care concerns; family seeking personal care services. . Over the next 90 days, BSW will collaborate with RN Care Manager to address care management and care coordination needs  Interventions:   . Follow up call to daughter regarding long term care/placement.  Family no longer seeking placement.  Now requesting assistance with obtaining personal care services.   . Provided education to patient/caregiver regarding process for obtaining personal care services  . Assessed Caregiver's understanding of current care coordination needs and health conditions.  . Plan to collaborate with provider for completion of request for personal care services   Patient Self Care Activities:  . Attends all scheduled provider appointments  Please see past updates related to this goal by clicking on the "Past Updates" button in the selected goal         The patient verbalized understanding of instructions provided today and declined a print copy of patient instruction materials.   The care management team will reach out to the patient again over the next 14 days.   Malachy Chamber, BSW Embedded Care Coordination Social Worker [facility] [phone]

## 2019-03-22 NOTE — Chronic Care Management (AMB) (Signed)
  Care Management   Follow Up Note   03/22/2019 Name: Jennifer Owen MRN: [id number] DOB: [birth year]  Referred by: Bridget Hartshorn, DO Reason for referral : Care Coordination (Level of care)   Jennifer Owen is a 86 y.o. year old female who is a primary care patient of Bridget Hartshorn, DO. The care management team was consulted for assistance with care management and care coordination needs.    Review of patient status, including review of consultants reports, relevant laboratory and other test results, and collaboration with appropriate care team members and the patient's provider was performed as part of comprehensive patient evaluation and provision of chronic care management services.    SDOH (Social Determinants of Health) assessments performed: No See Care Plan activities for detailed interventions related to [facility])     Advanced Directives: See Care Plan and Vynca application for related entries.   Goals Addressed            This Visit's Progress   . "Per Caregiver, we are unable to take care of mom at home.  She needs 24 hour care." (pt-stated)       Current Barriers:  Marland Kitchen Knowledge Barriers related to resources and support available to address needs related to Level of care concerns ADL IADL limitations  Case Manager Clinical Goal(s):  Marland Kitchen Over the next 90 days, caregivers will work with BSW to address needs related to Level of care concerns; family seeking personal care services. . Over the next 90 days, BSW will collaborate with RN Care Manager to address care management and care coordination needs  Interventions:   . Follow up call to daughter regarding long term care/placement.  Family no longer seeking placement.  Now requesting assistance with obtaining personal care services.   . Provided education to patient/caregiver regarding process for obtaining personal care services  . Assessed Caregiver's understanding of current care coordination needs and  health conditions.  . Plan to collaborate with provider for completion of request for personal care services   Patient Self Care Activities:  . Attends all scheduled provider appointments  Please see past updates related to this goal by clicking on the "Past Updates" button in the selected goal          The care management team will reach out to the patient again over the next 14 days days.      Malachy Chamber, BSW Embedded Care Coordination Social Worker [facility] [phone]

## 2019-04-03 ENCOUNTER — Ambulatory Visit: Payer: Self-pay

## 2019-04-03 DIAGNOSIS — I10 Essential (primary) hypertension: Secondary | ICD-10-CM

## 2019-04-03 DIAGNOSIS — G308 Other Alzheimer's disease: Secondary | ICD-10-CM

## 2019-04-03 DIAGNOSIS — F0281 Dementia in other diseases classified elsewhere with behavioral disturbance: Secondary | ICD-10-CM

## 2019-04-03 NOTE — Chronic Care Management (AMB) (Signed)
  Care Management   Follow Up Note   04/03/2019 Name: DAVIANNA DEUTSCHMAN MRN: [id number] DOB: [birth year]  Referred by: Bridget Hartshorn, DO Reason for referral : Care Coordination (PCS)   Jennifer Owen is a 86 y.o. year old female who is a primary care patient of Bridget Hartshorn, DO. The care management team was consulted for assistance with care management and care coordination needs.    Review of patient status, including review of consultants reports, relevant laboratory and other test results, and collaboration with appropriate care team members and the patient's provider was performed as part of comprehensive patient evaluation and provision of chronic care management services.    SDOH (Social Determinants of Health) assessments performed: No See Care Plan activities for detailed interventions related to [facility])     Advanced Directives: See Care Plan and Vynca application for related entries.   Goals Addressed            This Visit's Progress   . "Per Caregiver, we are unable to take care of mom at home.  She needs 24 hour care." (pt-stated)       Current Barriers:  Marland Kitchen Knowledge Barriers related to resources and support available to address Level of care concerns; ADL IADL limitations  Case Manager Clinical Goal(s):  Marland Kitchen Over the next 90 days, caregivers will work with BSW to address needs related to Level of care concerns; family seeking personal care services. . Over the next 90 days, BSW will collaborate with RN Care Manager to address care management and care coordination needs  Interventions:  . Collaborated with Dr. Chesley Mires regarding completion of request for Personal Care Services.  Request will be completed on 04/04/19. . Will fax completed request to [facility] upon receipt.    Patient Self Care Activities:  . Attends all scheduled provider appointments  Please see past updates related to this goal by clicking on the "Past Updates" button in  the selected goal          Telephone follow up appointment with care management team member scheduled for:04/05/19.     Malachy Chamber, BSW Embedded Care Coordination Social Worker [facility] [phone]

## 2019-04-03 NOTE — Patient Instructions (Signed)
Visit Information  Goals Addressed            This Visit's Progress   . "Per Caregiver, we are unable to take care of mom at home.  She needs 24 hour care." (pt-stated)       Current Barriers:  Marland Kitchen Knowledge Barriers related to resources and support available to address Level of care concerns; ADL IADL limitations  Case Manager Clinical Goal(s):  Marland Kitchen Over the next 90 days, caregivers will work with BSW to address needs related to Level of care concerns; family seeking personal care services. . Over the next 90 days, BSW will collaborate with RN Care Manager to address care management and care coordination needs  Interventions:  . Collaborated with Dr. Chesley Mires regarding completion of request for Personal Care Services.  Request will be completed on 04/04/19. . Will fax completed request to [facility] upon receipt.    Patient Self Care Activities:  . Attends all scheduled provider appointments  Please see past updates related to this goal by clicking on the "Past Updates" button in the selected goal         Patient verbalizes understanding of instructions provided today.   Face to Face appointment with care management team member scheduled for: 04/05/19       Malachy Chamber, BSW Embedded Care Coordination Social Worker [facility] [phone]

## 2019-04-04 ENCOUNTER — Ambulatory Visit: Payer: Self-pay

## 2019-04-04 NOTE — Chronic Care Management (AMB) (Signed)
Care Management   Follow Up Note   04/04/2019 Name: Jennifer Owen MRN: [id number] DOB: Jun 02, 1931  Referred by: Bridget Hartshorn, DO Reason for referral : Care Coordination (PCS)   Jennifer Owen is a 86 y.o. year old female who is a primary care patient of Bridget Hartshorn, DO. The care management team was consulted for assistance with care management and care coordination needs.    Review of patient status, including review of consultants reports, relevant laboratory and other test results, and collaboration with appropriate care team members and the patient's provider was performed as part of comprehensive patient evaluation and provision of chronic care management services.    SDOH (Social Determinants of Health) assessments performed: No See Care Plan activities for detailed interventions related to [facility])     Advanced Directives: See Care Plan and Vynca application for related entries.   Goals Addressed            This Visit's Progress   . COMPLETED: " We need help finding Mom a place to go where she can receive round the clock help" (pt-stated)       CARE PLAN ENTRY (see longitudinal plan of care for additional care plan information)   Current Barriers:  . Chronic Disease Management support, education, and care coordination needs related to HTN and Dementia  Case Manager Clinical Goal(s):  Marland Kitchen Over the next 90 days, patient will work with BSW to address needs related to Level of care concerns . ADL IADL limitations, Cognitive Deficits in patient with HTN and Dementia  Interventions:  . Collaborated with BSW to initiate plan of care to address needs related to Level of care concerns . Cognitive Deficits . Memory Deficits . Inability to perform ADL's independently . Inability to perform IADL's independently in patient with HTN and Dementia  Patient Self Care Activities:  . Patient and patient's family will  work with care management team in addressing  level of care issues  Initial goal documentation     . COMPLETED: "Per Caregiver, we are unable to take care of mom at home.  She needs 24 hour care." (pt-stated)       Current Barriers:  Marland Kitchen Knowledge Barriers related to resources and support available to address Level of care concerns; ADL IADL limitations  Case Manager Clinical Goal(s):  Marland Kitchen Over the next 90 days, caregivers will work with BSW to address needs related to Level of care concerns; family seeking personal care services. . Over the next 90 days, BSW will collaborate with RN Care Manager to address care management and care coordination needs  Interventions:  . Collaborated with Dr. Chesley Mires regarding completion of request for Personal Care Services.  Request will be completed on 04/04/19. . Will fax completed request to [facility] upon receipt.    Patient Self Care Activities:  . Attends all scheduled provider appointments  Please see past updates related to this goal by clicking on the "Past Updates" button in the selected goal      . Per Caregiver, "We no longer want to find placement for mom but would like an aide for her" (pt-stated)       Current Barriers:  Marland Kitchen Knowledge Barriers related to resources and support available to address  Level of care concerns  Case Manager Clinical Goal(s):  Marland Kitchen Over the next 30 days, patient will work with BSW to address needs related to Level of care concerns.  Family seeking Personal Care Services.  . Over the next  30 days, BSW will collaborate with RN Care Manager to address care management and care coordination needs  Interventions:  . Faxed completed request for personal care services to [facility].   Patient Self Care Activities:  . Patient has supportive family/caregivers   Initial goal documentation         Telephone follow up appointment with care management team member scheduled for:04/05/19.     Ronn Melena, [place] Coordination Social  Worker [place] [phone]

## 2019-04-05 ENCOUNTER — Other Ambulatory Visit: Payer: Self-pay | Admitting: Internal Medicine

## 2019-04-05 ENCOUNTER — Ambulatory Visit: Payer: Medicare Other

## 2019-04-05 DIAGNOSIS — F0281 Dementia in other diseases classified elsewhere with behavioral disturbance: Secondary | ICD-10-CM

## 2019-04-05 DIAGNOSIS — F02818 Dementia in other diseases classified elsewhere, unspecified severity, with other behavioral disturbance: Secondary | ICD-10-CM

## 2019-04-05 NOTE — Progress Notes (Signed)
Internal Medicine Clinic Resident  I have personally reviewed this encounter including the documentation in this note and/or discussed this patient with the care management provider. I will address any urgent items identified by the care management provider and will communicate my actions to the patient's PCP. I have reviewed the patient's CCM visit with my supervising attending.  Bryston Colocho, MD 04/05/2019    

## 2019-04-05 NOTE — Patient Instructions (Signed)
Visit Information  Goals Addressed            This Visit's Progress   . Per Caregiver, "We no longer want to find placement for mom but would like an aide for her" (pt-stated)       Current Barriers:  Marland Kitchen Knowledge Barriers related to resources and support available to address  Level of care concerns  Case Manager Clinical Goal(s):  Marland Kitchen Over the next 30 days, patient will work with BSW to address needs related to Level of care concerns.  Family seeking Personal Care Services.  . Over the next 30 days, BSW will collaborate with RN Care Manager to address care management and care coordination needs  Interventions:  . Informed daughter that request for Personal Care Services was faxed to [facility] on 04/04/19 . Informed daughter that representative from [facility] will be contacting them for purpose of assessment.     Patient Self Care Activities:  . Patient has supportive family/caregivers   Please see past updates related to this goal by clicking on the "Past Updates" button in the selected goal         Patient verbalizes understanding of instructions provided today.   Face to Face appointment with care management team member scheduled for: 04/08/19       Malachy Chamber, BSW Embedded Care Coordination Social Worker [facility] [phone]

## 2019-04-05 NOTE — Chronic Care Management (AMB) (Signed)
  Care Management   Follow Up Note   04/05/2019 Name: ANNIEBELLE DEVORE MRN: [id number] DOB: 1932/01/08  Referred by: Bridget Hartshorn, DO Reason for referral : Care Coordination (PCS)   SHARENE KRIKORIAN is a 86 y.o. year old female who is a primary care patient of Bridget Hartshorn, DO. The care management team was consulted for assistance with care management and care coordination needs.    Review of patient status, including review of consultants reports, relevant laboratory and other test results, and collaboration with appropriate care team members and the patient's provider was performed as part of comprehensive patient evaluation and provision of chronic care management services.    SDOH (Social Determinants of Health) assessments performed: No See Care Plan activities for detailed interventions related to [facility])     Advanced Directives: See Care Plan and Vynca application for related entries.   Goals Addressed            This Visit's Progress   . Per Caregiver, "We no longer want to find placement for mom but would like an aide for her" (pt-stated)       Current Barriers:  Marland Kitchen Knowledge Barriers related to resources and support available to address  Level of care concerns  Case Manager Clinical Goal(s):  Marland Kitchen Over the next 30 days, patient will work with BSW to address needs related to Level of care concerns.  Family seeking Personal Care Services.  . Over the next 30 days, BSW will collaborate with RN Care Manager to address care management and care coordination needs  Interventions:  . Informed daughter that request for Personal Care Services was faxed to [facility] on 04/04/19 . Informed daughter that representative from [facility] will be contacting them for purpose of assessment.     Patient Self Care Activities:  . Patient has supportive family/caregivers   Please see past updates related to this goal by clicking on the "Past Updates" button in  the selected goal          Telephone follow up appointment with care management team member scheduled for:04/08/19    Malachy Chamber, BSW Embedded Care Coordination Social Worker [facility] [phone]

## 2019-04-05 NOTE — Progress Notes (Signed)
Internal Medicine Clinic Resident  I have personally reviewed this encounter including the documentation in this note and/or discussed this patient with the care management provider. I will address any urgent items identified by the care management provider and will communicate my actions to the patient's PCP. I have reviewed the patient's CCM visit with my supervising attending.  Hobert Poplaski, MD 04/05/2019    

## 2019-04-08 ENCOUNTER — Ambulatory Visit: Payer: Medicare Other

## 2019-04-08 DIAGNOSIS — F0281 Dementia in other diseases classified elsewhere with behavioral disturbance: Secondary | ICD-10-CM

## 2019-04-08 DIAGNOSIS — G308 Other Alzheimer's disease: Secondary | ICD-10-CM

## 2019-04-08 DIAGNOSIS — F02818 Dementia in other diseases classified elsewhere, unspecified severity, with other behavioral disturbance: Secondary | ICD-10-CM

## 2019-04-08 NOTE — Patient Instructions (Signed)
Visit Information  Goals Addressed            This Visit's Progress   . Per Caregiver, "We no longer want to find placement for mom but would like an aide for her" (pt-stated)       Current Barriers:  Marland Kitchen Knowledge Barriers related to resources and support to address  Level of care concerns  Case Manager Clinical Goal(s):  Marland Kitchen Over the next 30 days, patient will work with BSW to address needs related to Level of care concerns.  Family seeking Personal Care Services.  . Over the next 30 days, BSW will collaborate with RN Care Manager to address care management and care coordination needs  Interventions:  . Contacted [employer] to ensure that request for Personal Care Services was received and is being processed. . Informed daughter that request was received and that patient's assessment now needs to be scheduled.  . Provided daugher with contact information for [facility] and encouraged her/patient to go ahead and call versus waiting up to 14 days for representative to contact them for purpose of scheduling assessment  . Informed daughter that a Letter of Determination will be sent after completion of assessment.  If patient is approved for services, family/patient will then select agency to provide service.   Patient Self Care Activities:  . Patient has supportive family/caregivers   Please see past updates related to this goal by clicking on the "Past Updates" button in the selected goal         Patient verbalizes understanding of instructions provided today.   Telephone follow up appointment with care management team member scheduled for:   Jennifer Owen, BSW Embedded Care Coordination Social Worker [facility] [phone]

## 2019-04-08 NOTE — Chronic Care Management (AMB) (Signed)
  Care Management   Follow Up Note   04/08/2019 Name: Jennifer Owen MRN: [id number] DOB: 1931/07/27  Referred by: Bridget Hartshorn, DO Reason for referral : Care Coordination (PCS)   Jennifer Owen is a 86 y.o. year old female who is a primary care patient of Bridget Hartshorn, DO. The care management team was consulted for assistance with care management and care coordination needs.    Review of patient status, including review of consultants reports, relevant laboratory and other test results, and collaboration with appropriate care team members and the patient's provider was performed as part of comprehensive patient evaluation and provision of chronic care management services.    SDOH (Social Determinants of Health) assessments performed: No See Care Plan activities for detailed interventions related to [facility])     Advanced Directives: See Care Plan and Vynca application for related entries.   Goals Addressed            This Visit's Progress   . Per Caregiver, "We no longer want to find placement for mom but would like an aide for her" (pt-stated)       Current Barriers:  Marland Kitchen Knowledge Barriers related to resources and support to address  Level of care concerns  Case Manager Clinical Goal(s):  Marland Kitchen Over the next 30 days, patient will work with BSW to address needs related to Level of care concerns.  Family seeking Personal Care Services.  . Over the next 30 days, BSW will collaborate with RN Care Manager to address care management and care coordination needs  Interventions:  . Contacted [employer] to ensure that request for Personal Care Services was received and is being processed. . Informed daughter that request was received and that patient's assessment now needs to be scheduled.  . Provided daugher with contact information for [facility] and encouraged her/patient to go ahead and call versus waiting up to 14 days for representative to contact them  for purpose of scheduling assessment  . Informed daughter that a Letter of Determination will be sent after completion of assessment.  If patient is approved for services, family/patient will then select agency to provide service.   Patient Self Care Activities:  . Patient has supportive family/caregivers   Please see past updates related to this goal by clicking on the "Past Updates" button in the selected goal          Follow up call has been scheduled for 04/22/19   Malachy Chamber, BSW Embedded Care Coordination Social Worker [facility] [phone]

## 2019-04-08 NOTE — Progress Notes (Signed)
Internal Medicine Clinic Resident   I have personally reviewed this encounter including the documentation in this note and/or discussed this patient with the care management provider. I will address any urgent items identified by the care management provider and will communicate my actions to the patient's PCP. I have reviewed the patient's CCM visit with my supervising attending.  Lisa Blakeman, MD 04/08/2019    

## 2019-04-09 NOTE — Progress Notes (Signed)
Internal Medicine Clinic Attending  CCM services provided by the care management provider and their documentation were discussed with Dr. Melvin. We reviewed the pertinent findings, urgent action items addressed by the resident and non-urgent items to be addressed by the PCP.  I agree with the assessment, diagnosis, and plan of care documented in the CCM and resident's note.  Tywan Siever, MD 04/09/2019  

## 2019-04-09 NOTE — Progress Notes (Signed)
Internal Medicine Clinic Attending  CCM services provided by the care management provider and their documentation were discussed with Dr. Crista Elliot. We reviewed the pertinent findings, urgent action items addressed by the resident and non-urgent items to be addressed by the PCP.  I agree with the assessment, diagnosis, and plan of care documented in the CCM and resident's note.  Debe Coder, MD 04/09/2019

## 2019-04-10 NOTE — Progress Notes (Signed)
Internal Medicine Clinic Attending  CCM services provided by the care management provider and their documentation were discussed with Dr. Harbrecht. We reviewed the pertinent findings, urgent action items addressed by the resident and non-urgent items to be addressed by the PCP.  I agree with the assessment, diagnosis, and plan of care documented in the CCM and resident's note.  Saphronia Ozdemir, MD 04/10/2019 

## 2019-04-17 ENCOUNTER — Other Ambulatory Visit: Payer: Self-pay

## 2019-04-17 ENCOUNTER — Ambulatory Visit (INDEPENDENT_AMBULATORY_CARE_PROVIDER_SITE_OTHER): Payer: Medicare Other | Admitting: Internal Medicine

## 2019-04-17 ENCOUNTER — Encounter: Payer: Self-pay | Admitting: Internal Medicine

## 2019-04-17 DIAGNOSIS — F02818 Dementia in other diseases classified elsewhere, unspecified severity, with other behavioral disturbance: Secondary | ICD-10-CM

## 2019-04-17 DIAGNOSIS — F0281 Dementia in other diseases classified elsewhere with behavioral disturbance: Secondary | ICD-10-CM

## 2019-04-17 DIAGNOSIS — F0390 Unspecified dementia without behavioral disturbance: Secondary | ICD-10-CM

## 2019-04-17 DIAGNOSIS — Z885 Allergy status to narcotic agent status: Secondary | ICD-10-CM

## 2019-04-17 DIAGNOSIS — Z79899 Other long term (current) drug therapy: Secondary | ICD-10-CM

## 2019-04-17 NOTE — Patient Instructions (Signed)
Ms. Aispuro, It was great seeing you as always! I will message Amber the social worker to make sure she follows up on the personal care services at home and will also let her know you would like to pursue assisted living options.   Remember to stay as active doing your favorite activities as possible to keep your mind as healthy as you can.    Take care, Dr. Chesley Mires

## 2019-04-18 ENCOUNTER — Encounter: Payer: Self-pay | Admitting: Internal Medicine

## 2019-04-18 NOTE — Assessment & Plan Note (Signed)
Patient is now fully dependent on her daughters for ADLs. Seroquel has helped with agitation and sleep at night, but Drenda Freeze mentions this continues to be an intermittent issue. Her appetite is up and down, but no significant weight change.  She enjoys filling her day by watching TV, reading books, and painting in books her daughters bought her.  Ms. Diggins tells me that she wishes to pursue assisted living, because she knows it is too much burden on her daughters to fully care for her at home. Her daughter, Drenda Freeze, does admit to being overwhelmed with having to fully care for her mother at this stage of her disease. They have been working with Hospital doctor with CCM to get personal aid services to come to their home and help. The assessment for this is scheduled for Friday. They're hopeful to use these services in the interim while they pursue placement in an assisted living facility. Appreciate continued support from [facility] as we work towards finding long-term living for Jennifer Owen.

## 2019-04-18 NOTE — Progress Notes (Signed)
Established Patient Office Visit  Subjective:  Patient ID: Jennifer Owen, female    DOB: Feb 28, 1931  Age: 86 y.o. MRN: [id number]  CC:  Chief Complaint  Patient presents with  . Follow-up    discuss and start process for placement (assisted living)    HPI Jennifer Owen presents for follow-up on Dementia, desire for assisted living placement. She is accompanied by her daughter, Jennifer Owen, today. Please see problem based charting for further details.   Past Medical History:  Diagnosis Date  . GERD (gastroesophageal reflux disease)    Papulous gastropathy EGD Oct. 2007  . Hyperlipemia   . Hypertension   . IBS (irritable bowel syndrome)   . Low back pain    intermittent radiculopathy  . Osteoarthritis   . Osteopenia    T score-1.1 R [place], - 0.4 L spine  . Polio 1942  . Pulmonary embolism (HCC)    2/2 knee replacement  . Urinary incontinence     Past Surgical History:  Procedure Laterality Date  . ABDOMINAL HYSTERECTOMY  1985  . JOINT REPLACEMENT     total knee - left 1995, right 2004  . OOPHORECTOMY  1985    Family History  Problem Relation Age of Onset  . Cancer Daughter        colon caner, <60  . Cancer Mother        lung  . Alzheimer's disease Father   . Heart disease Son     Social History   Socioeconomic History  . Marital status: Legally Separated    Spouse name: Not on file  . Number of children: 7  . Years of education: Not on file  . Highest education level: Not on file  Occupational History  . 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 Occupation: [employer]  . 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 Smoking status: Former Smoker    Quit date: 1945    Years since quitting: 76.3  . Smokeless tobacco: Never Used  Substance and Sexual Activity  . Alcohol use: No    Alcohol/week: 0.0 standard drinks    Comment: Christmas time only  . Drug use: No  .  Sexual activity: Never  Other Topics Concern  . Not on file  Social History Narrative   Current Social History 07/02/2018        Patient lives with family (Daughter, Jennifer Owen and grandson) in a home which is 1 story. There are 2 steps with handrail on one side up to the entrance the patient uses.       Patient's method of transportation is personal car, driven by daughter.      The highest level of education was high school diploma.      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      Identified important Relationships are Daughter and 18 grandchildren.       Pets : None       Interests / Fun: work in garden, IT trainer, going to church daily for activities, read bible.       Current Stressors: None       Religious / Personal Beliefs: [employer]       L. Ducatte, RN, BSN       Social Determinants of Health   Financial Resource Strain:   .  Difficulty of Paying Living Expenses:   Food Insecurity: No Food Insecurity  . Worried About Programme researcher, broadcasting/film/video in the Last Year: Never true  . Ran Out of Food in the Last Year: Never true  Transportation Needs: No Transportation Needs  . Lack of Transportation (Medical): No  . Lack of Transportation (Non-Medical): No  Physical Activity:   . Days of Exercise per Week:   . Minutes of Exercise per Session:   Stress:   . Feeling of Stress :   Social Connections:   . Frequency of Communication with Friends and Family:   . Frequency of Social Gatherings with Friends and Family:   . Attends Religious Services:   . Active Member of Clubs or Organizations:   . Attends Banker Meetings:   Marland Kitchen Marital Status:   Intimate Partner Violence:   . Fear of Current or Ex-Partner:   . Emotionally Abused:   Marland Kitchen Physically Abused:   . Sexually Abused:     Outpatient Medications Prior to Visit  Medication Sig Dispense Refill  . acetaminophen (TYLENOL) 500 MG tablet Take 500 mg by  mouth every 6 (six) hours as needed for mild pain.    Marland Kitchen amLODipine (NORVASC) 10 MG tablet Take 1 tablet (10 mg total) by mouth daily. 90 tablet 0  . aspirin 81 MG chewable tablet Chew 81 mg by mouth daily.      Marland Kitchen atorvastatin (LIPITOR) 40 MG tablet TAKE 1 TABLET BY MOUTH DAILY. (Patient taking differently: Take 40 mg by mouth daily. ) 90 tablet 3  . diclofenac sodium (VOLTAREN) 1 % GEL Apply 2 g upto 4 times a day to your knees as needed for knee pain. (Patient taking differently: Apply 2 g topically daily. For knee pain) 100 g 2  . esomeprazole (NEXIUM) 40 MG capsule TAKE 1 CAPSULE BY MOUTH DAILY BEFORE BREAKFAST. (Patient taking differently: Take 40 mg by mouth daily. ) 90 capsule 1  . hydrochlorothiazide (HYDRODIURIL) 25 MG tablet TAKE 1 TABLET BY MOUTH DAILY. (Patient taking differently: Take 25 mg by mouth daily. ) 60 tablet 11  . memantine (NAMENDA) 5 MG tablet TAKE 1 TABLET BY MOUTH 2 TIMES DAILY. (Patient taking differently: Take 5 mg by mouth daily. ) 180 tablet 1  . methimazole (TAPAZOLE) 10 MG tablet TAKE 1 TABLET BY MOUTH DAILY. 30 tablet 5  . QUEtiapine (SEROQUEL) 25 MG tablet Take 2 tablets (50 mg total) by mouth at bedtime. 60 tablet 2   No facility-administered medications prior to visit.    Allergies  Allergen Reactions  . Tramadol Hcl Nausea Only    stomach irritation    ROS Review of Systems  Constitutional: Positive for appetite change. Negative for chills and fever.  HENT: Negative for trouble swallowing.   Eyes: Negative for visual disturbance.  Respiratory: Negative for cough, choking and shortness of breath.   Cardiovascular: Negative for chest pain and palpitations.  Gastrointestinal: Negative for constipation, diarrhea and nausea.  Genitourinary: Negative for dysuria.  Musculoskeletal: Positive for arthralgias, back pain and gait problem.  Skin: Negative for rash and wound.  Neurological: Negative for syncope, speech difficulty, weakness and headaches.    Psychiatric/Behavioral: Positive for agitation and sleep disturbance.      Objective:    Physical Exam  Constitutional: She is oriented to person, place, and time. She appears well-developed and well-nourished. No distress.  HENT:  Head: Normocephalic and atraumatic.  Eyes: Conjunctivae are normal. Left eye exhibits no discharge.  Cardiovascular: Normal rate and  regular rhythm.  Pulmonary/Chest: Effort normal and breath sounds normal.  Abdominal: Soft. She exhibits no distension. There is no abdominal tenderness.  Musculoskeletal:        General: Edema present.     Cervical back: Neck supple.  Neurological: She is alert and oriented to person, place, and time.  Skin: Skin is warm and dry.  Psychiatric: She has a normal mood and affect.    BP 134/71 (BP Location: Right Arm, Patient Position: Sitting, Cuff Size: Normal)   Pulse 93   Temp 98.4 F (36.9 C) (Oral)   Wt 172 lb 9.6 oz (78.3 kg)   BMI 27.86 kg/m  Wt Readings from Last 3 Encounters:  04/17/19 172 lb 9.6 oz (78.3 kg)  03/11/19 173 lb 12.8 oz (78.8 kg)  02/28/19 191 lb 12.8 oz (87 kg)     There are no preventive care reminders to display for this patient.  There are no preventive care reminders to display for this patient.  Lab Results  Component Value Date   TSH 0.016 (L) 02/27/2019   Lab Results  Component Value Date   WBC 4.9 03/01/2019   HGB 11.6 (L) 03/01/2019   HCT 34.9 (L) 03/01/2019   MCV 104.5 (H) 03/01/2019   PLT 211 03/01/2019   Lab Results  Component Value Date   NA 142 03/01/2019   K 4.3 03/01/2019   CO2 24 03/01/2019   GLUCOSE 94 03/01/2019   BUN 19 03/01/2019   CREATININE 0.87 03/01/2019   BILITOT 0.8 02/28/2019   ALKPHOS 54 02/28/2019   AST 17 02/28/2019   ALT 18 02/28/2019   PROT 6.0 (L) 02/28/2019   ALBUMIN 3.3 (L) 02/28/2019   CALCIUM 10.2 03/01/2019   ANIONGAP 9 03/01/2019   Lab Results  Component Value Date   CHOL 157 02/27/2019   Lab Results  Component Value Date    HDL 98 02/27/2019   Lab Results  Component Value Date   LDLCALC 46 02/27/2019   Lab Results  Component Value Date   TRIG 65 02/27/2019   Lab Results  Component Value Date   CHOLHDL 1.6 02/27/2019   Lab Results  Component Value Date   HGBA1C 5.2 02/27/2019      Assessment & Plan:   Problem List Items Addressed This Visit      Nervous and Auditory   Dementia ([place])    Patient is now fully dependent on her daughters for ADLs. Seroquel has helped with agitation and sleep at night, but Jennifer Owen mentions this continues to be an intermittent issue. Her appetite is up and down, but no significant weight change.  She enjoys filling her day by watching TV, reading books, and painting in books her daughters bought her.  Jennifer Owen tells me that she wishes to pursue assisted living, because she knows it is too much burden on her daughters to fully care for her at home. Her daughter, Jennifer Owen, does admit to being overwhelmed with having to fully care for her mother at this stage of her disease. They have been working with Museum/gallery conservator with [facility] to get personal aid services to come to their home and help. The assessment for this is scheduled for Friday. They're hopeful to use these services in the interim while they pursue placement in an assisted living facility. Appreciate continued support from [employer] as we work towards finding long-term living for Jennifer Owen.          No orders of the defined types were placed in this encounter.  Follow-up: Return in about 6 months (around 10/17/2019), or if symptoms worsen or fail to improve.    Bridget Hartshorn, DO

## 2019-04-20 ENCOUNTER — Ambulatory Visit: Payer: Medicare Other | Attending: Internal Medicine

## 2019-04-20 DIAGNOSIS — Z23 Encounter for immunization: Secondary | ICD-10-CM

## 2019-04-20 NOTE — Progress Notes (Signed)
   Covid-19 Vaccination Clinic  Name:  Jennifer Owen    MRN: [id number] DOB: Aug 16, 1931  04/20/2019  [name] was observed post Covid-19 immunization for 15 minutes without incident. She was provided with Vaccine Information Sheet and instruction to access the V-Safe system.   [name] was instructed to call 911 with any severe reactions post vaccine: Marland Kitchen Difficulty breathing  . Swelling of face and throat  . A fast heartbeat  . A bad rash all over body  . Dizziness and weakness   Immunizations Administered    Name Date Dose VIS Date Route   Moderna COVID-19 Vaccine 04/20/2019 12:56 PM 0.5 mL 12/11/2018 Intramuscular   Manufacturer: Moderna   Lot: [id number]   NDC: 29476-546-50

## 2019-04-22 ENCOUNTER — Ambulatory Visit: Payer: Medicare Other

## 2019-04-22 DIAGNOSIS — I10 Essential (primary) hypertension: Secondary | ICD-10-CM

## 2019-04-22 DIAGNOSIS — F0281 Dementia in other diseases classified elsewhere with behavioral disturbance: Secondary | ICD-10-CM

## 2019-04-22 DIAGNOSIS — G308 Other Alzheimer's disease: Secondary | ICD-10-CM

## 2019-04-22 NOTE — Progress Notes (Signed)
Internal Medicine Clinic Resident  I have personally reviewed this encounter including the documentation in this note and/or discussed this patient with the care management provider. I will address any urgent items identified by the care management provider and will communicate my actions to the patient's PCP. I have reviewed the patient's CCM visit with my supervising attending.  Tenea Sens, MD 04/22/2019    

## 2019-04-22 NOTE — Progress Notes (Signed)
Internal Medicine Clinic Attending  CCM services provided by the care management provider and their documentation were reviewed with Dr. Yetta Barre.  We reviewed the pertinent findings, urgent action items addressed by the resident and non-urgent items to be addressed by the PCP.  I agree with the assessment, diagnosis, and plan of care documented in the CCM and resident's note.  Anne Shutter, MD 04/22/2019

## 2019-04-22 NOTE — Patient Instructions (Signed)
Visit Information  Goals Addressed            This Visit's Progress   . COMPLETED: Per Caregiver, "We no longer want to find placement for mom but would like an aide for her" (pt-stated)       Current Barriers:  Marland Kitchen Knowledge Barriers related to resources and support to address  Level of care concerns  Case Manager Clinical Goal(s):  Marland Kitchen Over the next 30 days, patient will work with BSW to address needs related to Level of care concerns.  Family seeking Personal Care Services.  . Over the next 30 days, BSW will collaborate with RN Care Manager to address care management and care coordination needs  Interventions:  . [place] to ensure that request for [place] was received and is being processed. . Informed daughter that request was received and that patient's assessment now needs to be scheduled.  . Provided daugher with contact information for [facility] and encouraged her/patient to go ahead and call versus waiting up to 14 days for representative to contact them for purpose of scheduling assessment  . Informed daughter that a Letter of Determination will be sent after completion of assessment.  If patient is approved for services, family/patient will then select agency to provide service.   Patient Self Care Activities:  . Patient has supportive family/caregivers   Please see past updates related to this goal by clicking on the "Past Updates" button in the selected goal      . Per daughter, mom needs around the clock care (pt-stated)       Current Barriers:  Marland Kitchen Knowledge Barriers related to resources to address level of care.  Case Manager Clinical Goal(s):  Marland Kitchen Over the next 120 days, patient will work with BSW to address needs related to level of care concerns; securing placement . Over the next 120 days, BSW will collaborate with RN Care Manager to address care management and care coordination needs  Interventions:  . Educated daughter  about ALF, NH, SNF . Reminded daughter about process for obtaining placement . Completed FL 2; will forward to provider for signature . Mailed list of facilities to daughter so that she, patient, and other family members can determine which facilities to try for placement   Patient Self Care Activities:  . Attends all scheduled provider appointments . Acknowledges need for need for assistance and that this is difficult for family members to provide  Initial goal documentation     . Per daughter, mom needs personal care services until she goes to long term care facility (pt-stated)       Current Barriers:  Marland Kitchen Knowledge Barriers related to resources available to address Level of care concerns; obtaining Personal Care Services   Case Manager Clinical Goal(s):  Marland Kitchen Over the next 30 days, patient will work with BSW to address needs related to Level of care concerns; obtaining personal care services  . Over the next 30 days, BSW will collaborate with RN Care Manager to address care management and care coordination needs  Interventions:  . Informed daughter that patient should receive Determination Letter from [facility]; assessment occurred on 04/19/19 . Informed daughter that, if patient is approved, an agency to provide service will need to be selected.  Determination Letter will include list of service agencies to choose from  . Encouraged daughter/patient to choose three agencies in case top choice is not an option  . Informed daughter that she/patient have 30 days to  make selection   . Patient Self Care Activities:  . Attends all scheduled provider appointments  Initial goal documentation        Patient verbalizes understanding of instructions provided today.   Telephone follow up appointment with care management team member scheduled for:05/02/19      Malachy Chamber, BSW Embedded Care Coordination Social Worker [facility] [phone]

## 2019-04-22 NOTE — Chronic Care Management (AMB) (Signed)
Care Management   Follow Up Note   04/22/2019 Name: Jennifer Owen MRN: [id number] DOB: 1931/04/27  Referred by: Bridget Hartshorn, DO Reason for referral : Care Coordination (PCS, Placement)   Jennifer Owen is a 86 y.o. year old female who is a primary care patient of Bridget Hartshorn, DO. The care management team was consulted for assistance with care management and care coordination needs.    Review of patient status, including review of consultants reports, relevant laboratory and other test results, and collaboration with appropriate care team members and the patient's provider was performed as part of comprehensive patient evaluation and provision of chronic care management services.    SDOH (Social Determinants of Health) assessments performed: No See Care Plan activities for detailed interventions related to [facility])     Advanced Directives: See Care Plan and Vynca application for related entries.   Goals Addressed            This Visit's Progress   . COMPLETED: Per Caregiver, "We no longer want to find placement for mom but would like an aide for her" (pt-stated)       Current Barriers:  Marland Kitchen Knowledge Barriers related to resources and support to address  Level of care concerns  Case Manager Clinical Goal(s):  Marland Kitchen Over the next 30 days, patient will work with BSW to address needs related to Level of care concerns.  Family seeking Personal Care Services.  . Over the next 30 days, BSW will collaborate with RN Care Manager to address care management and care coordination needs  Interventions:  . Contacted [employer] to ensure that request for Personal Care Services was received and is being processed. . Informed daughter that request was received and that patient's assessment now needs to be scheduled.  . Provided daugher with contact information for [facility] and encouraged her/patient to go ahead and call versus waiting up to 14 days for  representative to contact them for purpose of scheduling assessment  . Informed daughter that a Letter of Determination will be sent after completion of assessment.  If patient is approved for services, family/patient will then select agency to provide service.   Patient Self Care Activities:  . Patient has supportive family/caregivers   Please see past updates related to this goal by clicking on the "Past Updates" button in the selected goal      . Per daughter, mom needs around the clock care (pt-stated)       Current Barriers:  Marland Kitchen Knowledge Barriers related to resources to address level of care.  Case Manager Clinical Goal(s):  Marland Kitchen Over the next 120 days, patient will work with BSW to address needs related to level of care concerns; securing placement . Over the next 120 days, BSW will collaborate with RN Care Manager to address care management and care coordination needs  Interventions:  . Educated daughter about ALF, NH, SNF . Reminded daughter about process for obtaining placement . Completed FL 2; will forward to provider for signature . Mailed list of facilities to daughter so that she, patient, and other family members can determine which facilities to try for placement   Patient Self Care Activities:  . Attends all scheduled provider appointments . Acknowledges need for need for assistance and that this is difficult for family members to provide  Initial goal documentation     . Per daughter, mom needs personal care services until she goes to long term care facility (pt-stated)  Current Barriers:  Marland Kitchen Knowledge Barriers related to resources available to address Level of care concerns; obtaining Personal Care Services   Case Manager Clinical Goal(s):  Marland Kitchen Over the next 30 days, patient will work with BSW to address needs related to Level of care concerns; obtaining personal care services  . Over the next 30 days, BSW will collaborate with RN Care Manager to address care  management and care coordination needs  Interventions:  . Informed daughter that patient should receive Determination Letter from [facility]; assessment occurred on 04/19/19 . Informed daughter that, if patient is approved, an agency to provide service will need to be selected.  Determination Letter will include list of service agencies to choose from  . Encouraged daughter/patient to choose three agencies in case top choice is not an option  . Informed daughter that she/patient have 30 days to make selection   . Patient Self Care Activities:  . Attends all scheduled provider appointments  Initial goal documentation         Telephone follow up appointment with care management team member scheduled for:05/02/19      Ronn Melena, [place] Coordination Social Worker [place] [phone]

## 2019-04-25 NOTE — Progress Notes (Signed)
Internal Medicine Clinic Attending  Case discussed with Dr. Bloomfield at the time of the visit.  We reviewed the resident's history and exam and pertinent patient test results.  I agree with the assessment, diagnosis, and plan of care documented in the resident's note.  

## 2019-04-30 ENCOUNTER — Ambulatory Visit: Payer: Self-pay

## 2019-04-30 DIAGNOSIS — G308 Other Alzheimer's disease: Secondary | ICD-10-CM

## 2019-04-30 DIAGNOSIS — I10 Essential (primary) hypertension: Secondary | ICD-10-CM

## 2019-04-30 DIAGNOSIS — F0281 Dementia in other diseases classified elsewhere with behavioral disturbance: Secondary | ICD-10-CM

## 2019-04-30 NOTE — Chronic Care Management (AMB) (Signed)
  Care Management   Follow Up Note   04/30/2019 Name: Jennifer Owen MRN: [id number] DOB: 1931/04/25  Referred by: Jennifer Hartshorn, DO Reason for referral : Care Coordination (Placement)   Jennifer Owen is a 86 y.o. year old female who is a primary care patient of Jennifer Hartshorn, DO. The care management team was consulted for assistance with care management and care coordination needs.    Review of patient status, including review of consultants reports, relevant laboratory and other test results, and collaboration with appropriate care team members and the patient's provider was performed as part of comprehensive patient evaluation and provision of chronic care management services.    SDOH (Social Determinants of Health) assessments performed: No See Care Plan activities for detailed interventions related to [facility])     Advanced Directives: See Care Plan and Vynca application for related entries.   Goals Addressed            This Visit's Progress   . Per daughter, mom needs around the clock care (pt-stated)       Current Barriers:  Marland Kitchen Knowledge Barriers related to resources to address level of care.  Case Manager Clinical Goal(s):  Marland Kitchen Over the next 120 days, patient will work with BSW to address needs related to level of care concerns; securing placement . Over the next 120 days, BSW will collaborate with RN Care Manager to address care management and care coordination needs  Interventions:  . Collaborated with LCSW, Jennifer Owen, to request PASRR for patient.  PASRR number [phone] . Completed FL 2 in Epic and routed to provider for signature  Patient Self Care Activities:  . Attends all scheduled provider appointments . Acknowledges need for need for assistance and that this is difficult for family members to provide  Please see past updates related to this goal by clicking on the "Past Updates" button in the selected goal           Telephone follow up appointment with care management team member scheduled for:05/02/19     Jennifer Owen, BSW Embedded Care Coordination Social Worker [facility] [phone]

## 2019-04-30 NOTE — Progress Notes (Signed)
Internal Medicine Clinic Resident   I have personally reviewed this encounter including the documentation in this note and/or discussed this patient with the care management provider. I will address any urgent items identified by the care management provider and will communicate my actions to the patient's PCP. I have reviewed the patient's CCM visit with my supervising attending.  Evalynn Hankins, MD 04/30/2019   

## 2019-04-30 NOTE — NC FL2 (Signed)
[place] LEVEL OF CARE SCREENING TOOL     IDENTIFICATION  Patient Name: RUCHAMA KUBICEK Birthdate: [birth year] Sex: female Admission Date (Current Location): (Not on file)  [place] and [place] Number:  Kathleen Argue [id number] T Facility and Address:  [place], [place] [address] [place], [place] [address]      Provider Number: [id number]  Attending Physician Name and Address:  Modena Nunnery  Relative Name and Phone Number:  Myli Pae  [phone]    Current Level of Care: Home Recommended Level of Care: [place] Prior Approval Number:    Date Approved/Denied:   PASRR Number: [id number]  Discharge Plan:      Current Diagnoses: Patient Active Problem List   Diagnosis Date Noted  . Hyperthyroidism 03/02/2019  . Acute delirium 03/02/2019  . Acute encephalopathy 02/27/2019  . Vertigo 10/16/2018  . Chronic cough 01/16/2018  . Dementia ([place]) 03/08/2017  . Headache 10/01/2015  . Dark stools 06/22/2015  . History of CVA (cerebrovascular accident) 06/17/2014  . Healthcare maintenance 06/16/2014  . Frequent falls 04/28/2014  . Bradycardia 11/20/2013  . Hypercalcemia 06/07/2013  . Knee pain, chronic 01/14/2013  . NONEXUDATIVE SENILE MACULAR DEGENERATION RETINA 03/20/2008  . CONSTIPATION, CHRONIC 11/27/2006  . HLD (hyperlipidemia) 12/15/2005  . PULMONARY EMBOLISM, HX OF 12/15/2005  . Essential hypertension 10/26/2005  . GERD 10/26/2005  . Osteoarthritis of multiple joints 10/26/2005  . Knee joint replacement by other means 10/26/2005    Orientation RESPIRATION BLADDER Height & Weight     Self  Normal Continent Weight:172  Height:  5'6"  BEHAVIORAL SYMPTOMS/MOOD NEUROLOGICAL BOWEL NUTRITION STATUS      Continent Diet  AMBULATORY STATUS COMMUNICATION OF NEEDS Skin   Supervision Verbally Normal                       Personal Care Assistance Level of Assistance  Bathing, Dressing Bathing Assistance:  Limited assistance   Dressing Assistance: Limited assistance     Functional Limitations Info             SPECIAL CARE FACTORS FREQUENCY                       Contractures Contractures Info: Not present    Additional Factors Info                  Current Medications (04/30/2019):   Current Outpatient Medications  Medication Sig Dispense Refill  . acetaminophen (TYLENOL) 500 MG tablet Take 500 mg by mouth every 6 (six) hours as needed for mild pain.    Marland Kitchen amLODipine (NORVASC) 10 MG tablet Take 1 tablet (10 mg total) by mouth daily. 90 tablet 0  . aspirin 81 MG chewable tablet Chew 81 mg by mouth daily.      Marland Kitchen atorvastatin (LIPITOR) 40 MG tablet TAKE 1 TABLET BY MOUTH DAILY. (Patient taking differently: Take 40 mg by mouth daily. ) 90 tablet 3  . diclofenac sodium (VOLTAREN) 1 % GEL Apply 2 g upto 4 times a day to your knees as needed for knee pain. (Patient taking differently: Apply 2 g topically daily. For knee pain) 100 g 2  . esomeprazole (NEXIUM) 40 MG capsule TAKE 1 CAPSULE BY MOUTH DAILY BEFORE BREAKFAST. (Patient taking differently: Take 40 mg by mouth daily. ) 90 capsule 1  . hydrochlorothiazide (HYDRODIURIL) 25 MG tablet TAKE 1 TABLET BY MOUTH DAILY. (Patient taking differently: Take 25 mg by mouth daily. )  60 tablet 11  . memantine (NAMENDA) 5 MG tablet TAKE 1 TABLET BY MOUTH 2 TIMES DAILY. (Patient taking differently: Take 5 mg by mouth daily. ) 180 tablet 1  . methimazole (TAPAZOLE) 10 MG tablet TAKE 1 TABLET BY MOUTH DAILY. 30 tablet 5  . QUEtiapine (SEROQUEL) 25 MG tablet Take 2 tablets (50 mg total) by mouth at bedtime. 60 tablet 2   No current facility-administered medications for this visit.     Discharge Medications:   Relevant Imaging Results:  Relevant Lab Results:   Additional Information    Tayvia Faughnan, [facility]

## 2019-04-30 NOTE — Progress Notes (Signed)
Internal Medicine Clinic Attending  CCM services provided by the care management provider and their documentation were discussed with Dr. Winters. We reviewed the pertinent findings, urgent action items addressed by the resident and non-urgent items to be addressed by the PCP.  I agree with the assessment, diagnosis, and plan of care documented in the CCM and resident's note.  Marian Grandt, MD 04/30/2019  

## 2019-04-30 NOTE — Patient Instructions (Signed)
Visit Information  Goals Addressed            This Visit's Progress   . Per daughter, mom needs around the clock care (pt-stated)       Current Barriers:  Marland Kitchen Knowledge Barriers related to resources to address level of care.  Case Manager Clinical Goal(s):  Marland Kitchen Over the next 120 days, patient will work with BSW to address needs related to level of care concerns; securing placement . Over the next 120 days, BSW will collaborate with RN Care Manager to address care management and care coordination needs  Interventions:  . Collaborated with LCSW, Sammuel Hines, to request PASRR for patient.  PASRR number [phone] . Completed FL 2 in Epic and routed to provider for signature  Patient Self Care Activities:  . Attends all scheduled provider appointments . Acknowledges need for need for assistance and that this is difficult for family members to provide  Please see past updates related to this goal by clicking on the "Past Updates" button in the selected goal           Telephone follow up appointment with care management team member scheduled for:05/02/19    Malachy Chamber, BSW Embedded Care Coordination Social Worker [facility] [phone]

## 2019-05-02 ENCOUNTER — Ambulatory Visit: Payer: Medicare Other

## 2019-05-02 DIAGNOSIS — F0281 Dementia in other diseases classified elsewhere with behavioral disturbance: Secondary | ICD-10-CM

## 2019-05-02 DIAGNOSIS — F02818 Dementia in other diseases classified elsewhere, unspecified severity, with other behavioral disturbance: Secondary | ICD-10-CM

## 2019-05-02 DIAGNOSIS — I10 Essential (primary) hypertension: Secondary | ICD-10-CM

## 2019-05-02 NOTE — Patient Instructions (Signed)
Visit Information  Goals Addressed            This Visit's Progress   . Per daughter, mom needs around the clock care (pt-stated)       Current Barriers:  Marland Kitchen Knowledge Barriers related to resources to address level of care.  Case Manager Clinical Goal(s):  Marland Kitchen Over the next 120 days, patient will work with BSW to address needs related to level of care concerns; securing placement . Over the next 120 days, BSW will collaborate with RN Care Manager to address care management and care coordination needs  Interventions:  . Contacted daughter to ensure receipt of placement documentation mailed to her.  . Inquired if family and patient have discussed facilities in which they would like to pursue placement.  Family has not yet made decision.   Patient Self Care Activities:  . Attends all scheduled provider appointments . Acknowledges need for need for assistance and that this is difficult for family members to provide  Please see past updates related to this goal by clicking on the "Past Updates" button in the selected goal      . Per daughter, mom needs personal care services until she goes to long term care facility (pt-stated)       Current Barriers:  Marland Kitchen Knowledge Barriers related to resources available to address Level of care concerns; obtaining Personal Care Services   Case Manager Clinical Goal(s):  Marland Kitchen Over the next 30 days, patient will work with BSW to address needs related to Level of care concerns; obtaining personal care services  . Over the next 30 days, BSW will collaborate with RN Care Manager to address care management and care coordination needs  Interventions:  . Talked with patient's daughter, Drenda Freeze, about status of personal care services.  Per daughter, patient was approved and will receive services through [facility].  She was not certain of when services will start, however.    Patient Self Care Activities:  . Attends all scheduled provider appointments  Please see  past updates related to this goal by clicking on the "Past Updates" button in the selected goal         Patient verbalizes understanding of instructions provided today.   Telephone follow up appointment with care management team member scheduled for:05/17/19     Malachy Chamber, BSW Embedded Care Coordination Social Worker [facility] [phone]

## 2019-05-02 NOTE — Progress Notes (Signed)
Internal Medicine Clinic Resident   I have personally reviewed this encounter including the documentation in this note and/or discussed this patient with the care management provider. I will address any urgent items identified by the care management provider and will communicate my actions to the patient's PCP. I have reviewed the patient's CCM visit with my supervising attending.  Keiran Gaffey, MD 05/02/2019   

## 2019-05-02 NOTE — Chronic Care Management (AMB) (Signed)
  Care Management   Follow Up Note   05/02/2019 Name: Jennifer Owen MRN: [id number] DOB: [birth year]  Referred by: Bridget Hartshorn, DO Reason for referral : Care Coordination (Placement)   Jennifer Owen is a 86 y.o. year old female who is a primary care patient of Bridget Hartshorn, DO. The care management team was consulted for assistance with care management and care coordination needs.    Review of patient status, including review of consultants reports, relevant laboratory and other test results, and collaboration with appropriate care team members and the patient's provider was performed as part of comprehensive patient evaluation and provision of chronic care management services.    SDOH (Social Determinants of Health) assessments performed: No See Care Plan activities for detailed interventions related to [facility])     Advanced Directives: See Care Plan and Vynca application for related entries.   Goals Addressed            This Visit's Progress   . Per daughter, mom needs around the clock care (pt-stated)       Current Barriers:  Marland Kitchen Knowledge Barriers related to resources to address level of care.  Case Manager Clinical Goal(s):  Marland Kitchen Over the next 120 days, patient will work with BSW to address needs related to level of care concerns; securing placement . Over the next 120 days, BSW will collaborate with RN Care Manager to address care management and care coordination needs  Interventions:  . Contacted daughter to ensure receipt of placement documentation mailed to her.  . Inquired if family and patient have discussed facilities in which they would like to pursue placement.  Family has not yet made decision.   Patient Self Care Activities:  . Attends all scheduled provider appointments . Acknowledges need for need for assistance and that this is difficult for family members to provide  Please see past updates related to this goal by clicking on the "Past  Updates" button in the selected goal      . Per daughter, mom needs personal care services until she goes to long term care facility (pt-stated)       Current Barriers:  Marland Kitchen Knowledge Barriers related to resources available to address Level of care concerns; obtaining Personal Care Services   Case Manager Clinical Goal(s):  Marland Kitchen Over the next 30 days, patient will work with BSW to address needs related to Level of care concerns; obtaining personal care services  . Over the next 30 days, BSW will collaborate with RN Care Manager to address care management and care coordination needs  Interventions:  . Talked with patient's daughter, Drenda Freeze, about status of personal care services.  Per daughter, patient was approved and will receive services through [facility].  She was not certain of when services will start, however.    Patient Self Care Activities:  . Attends all scheduled provider appointments  Please see past updates related to this goal by clicking on the "Past Updates" button in the selected goal          Telephone follow up appointment with care management team member scheduled for:05/09/19   Malachy Chamber, BSW Embedded Care Coordination Social Worker [facility] [phone]

## 2019-05-08 NOTE — Progress Notes (Signed)
Internal Medicine Clinic Attending  CCM services provided by the care management provider and their documentation were discussed with Dr. Winters. We reviewed the pertinent findings, urgent action items addressed by the resident and non-urgent items to be addressed by the PCP.  I agree with the assessment, diagnosis, and plan of care documented in the CCM and resident's note.  Elvera Almario, MD 05/08/2019  

## 2019-05-09 ENCOUNTER — Telehealth: Payer: Medicare Other

## 2019-05-09 ENCOUNTER — Ambulatory Visit: Payer: Self-pay

## 2019-05-09 NOTE — Chronic Care Management (AMB) (Signed)
  Chronic Care Management   Outreach Note  05/09/2019 Name: MEGHEN AKOPYAN MRN: [id number] DOB: 04/06/1931  Referred by: Bridget Hartshorn, DO Reason for referral : Care Coordination (Placement)   An unsuccessful telephone outreach was attempted today. The patient was referred to the case management team for assistance with care management and care coordination.   Follow Up Plan: A HIPPA compliant phone message was left for the patient/daughter providing contact information and requesting a return call.     Malachy Chamber, BSW Embedded Care Coordination Social Worker [facility] [phone]

## 2019-05-14 ENCOUNTER — Ambulatory Visit: Payer: Medicare Other

## 2019-05-14 ENCOUNTER — Other Ambulatory Visit: Payer: Self-pay | Admitting: Internal Medicine

## 2019-05-14 DIAGNOSIS — I10 Essential (primary) hypertension: Secondary | ICD-10-CM

## 2019-05-14 DIAGNOSIS — F0281 Dementia in other diseases classified elsewhere with behavioral disturbance: Secondary | ICD-10-CM

## 2019-05-14 DIAGNOSIS — F02818 Dementia in other diseases classified elsewhere, unspecified severity, with other behavioral disturbance: Secondary | ICD-10-CM

## 2019-05-14 NOTE — Patient Instructions (Signed)
Visit Information  Goals Addressed            This Visit's Progress   . Per daughter, mom needs around the clock care (pt-stated)       Current Barriers:  Marland Kitchen Knowledge Barriers related to resources to address level of care.  Case Manager Clinical Goal(s):  Marland Kitchen Over the next 120 days, patient will work with BSW to address needs related to level of care concerns; securing placement . Over the next 120 days, BSW will collaborate with RN Care Manager to address care management and care coordination needs  Interventions:   . Talked with patient's daughter, Jennifer Owen, regarding status of seeking placement.   Daughter reports that, due to her own recent medical issues, they have not had the opportunity to thoroughly discuss placement options.  Memory Argue to contact facilities to inquire about bed availability for Medicaid patient to narrow down options for patient/caregivers . Contacted the following: o [facility] beds available  o [facility] o [facility]-left message o [facility] message o [facility]-bed available o [facility]-bed available  o [facility]-bed available  o [facility]-no Medicaid beds available   Patient Self Care Activities:  . Attends all scheduled provider appointments . Acknowledges need for need for assistance and that this is difficult for family members to provide  Please see past updates related to this goal by clicking on the "Past Updates" button in the selected goal      . Per daughter, mom needs personal care services until she goes to long term care facility (pt-stated)       Current Barriers:  Marland Kitchen Knowledge Barriers related to resources available to address Level of care concerns; obtaining Personal Care Services   Case Manager Clinical Goal(s):  Marland Kitchen Over the next 30 days, patient will work with BSW to address needs related to Level of care concerns; obtaining personal care services  . Over the next 30 days, BSW will  collaborate with RN Care Manager to address care management and care coordination needs  Interventions:  . Received voicemail from patient's daughter, Jennifer Owen, stating that PCS agency was selected but they have not heard back from [facility] regarding start of services. Jennifer Owen [facility] regarding status of services.  Per representative, [facility] has been selected by patient/caregivers on multiple occassions but they have denied each request due to lack of staffing.  Jennifer Owen patient's daughters Jennifer Owen and Jennifer Owen and provided above update.  . Informed daughters that another PCS agency will need to be selected due to lack of staffing at [facility].  They confirmed that they still have list of PCS agencies provided by [facility] . Encouraged daughters to contact selected agency prior to contacting [facility] to ensure request can be accommodated.   Memory Argue to contact PCS agencies on their behalf; daughter stated she would call back if assistance is needed.       Patient Self Care Activities:  . Attends all scheduled provider appointments  Please see past updates related to this goal by clicking on the "Past Updates" button in the selected goal         Patient verbalizes understanding of instructions provided today.   The patient has been provided with contact information for the care management team and has been advised to call with any health related questions or concerns.       Malachy Chamber, BSW Embedded Care Coordination Social Worker [facility] [phone]

## 2019-05-14 NOTE — Telephone Encounter (Signed)
Called pharmacy, spoke w/ pharmacist she stated it had been filled consistently for the past year and that daughter had spoken to her for refills. Called pt's daughter she stated she had stopped giving it to her a year ago. Informed her that the pharmacy stated that med had been refilled consistently and she stated again "but we havent given it to her" she then ask about the placement request because "I am overwhelmed"

## 2019-05-14 NOTE — Chronic Care Management (AMB) (Signed)
Care Management   Follow Up Note   05/14/2019 Name: Jennifer Owen MRN: [id number] DOB: [birth year]  Referred by: Bridget Hartshorn, DO Reason for referral : Care Coordination (PCS, Placement)   Jennifer Owen is a 86 y.o. year old female who is a primary care patient of Bridget Hartshorn, DO. The care management team was consulted for assistance with care management and care coordination needs.    Review of patient status, including review of consultants reports, relevant laboratory and other test results, and collaboration with appropriate care team members and the patient's provider was performed as part of comprehensive patient evaluation and provision of chronic care management services.    SDOH (Social Determinants of Health) assessments performed: No See Care Plan activities for detailed interventions related to [facility])     Advanced Directives: See Care Plan and Vynca application for related entries.   Goals Addressed            This Visit's Progress   . Per daughter, mom needs around the clock care (pt-stated)       Current Barriers:  Marland Kitchen Knowledge Barriers related to resources to address level of care.  Case Manager Clinical Goal(s):  Marland Kitchen Over the next 120 days, patient will work with BSW to address needs related to level of care concerns; securing placement . Over the next 120 days, BSW will collaborate with RN Care Manager to address care management and care coordination needs  Interventions:   . Talked with patient's daughter, [name], regarding status of seeking placement.   Daughter reports that, due to her own recent medical issues, they have not had the opportunity to thoroughly discuss placement options.  Memory Argue to contact facilities to inquire about bed availability for Medicaid patient to narrow down options for patient/caregivers . Contacted the following: o [facility] Medicaid beds available  o [facility] available o [facility]-left  message o [facility] message o [facility]-bed available o [facility]-bed available  o [facility]-bed available  o [facility]-no Medicaid beds available   Patient Self Care Activities:  . Attends all scheduled provider appointments . Acknowledges need for need for assistance and that this is difficult for family members to provide  Please see past updates related to this goal by clicking on the "Past Updates" button in the selected goal      . Per daughter, mom needs personal care services until she goes to long term care facility (pt-stated)       Current Barriers:  Marland Kitchen Knowledge Barriers related to resources available to address Level of care concerns; obtaining Personal Care Services   Case Manager Clinical Goal(s):  Marland Kitchen Over the next 30 days, patient will work with BSW to address needs related to Level of care concerns; obtaining personal care services  . Over the next 30 days, BSW will collaborate with RN Care Manager to address care management and care coordination needs  Interventions:  . Received voicemail from patient's daughter, [name], stating that PCS agency was selected but they have not heard back from [facility] regarding start of services. [name] [facility] regarding status of services.  Per representative, [facility] has been selected by patient/caregivers on multiple occassions but they have denied each request due to lack of staffing.  [name] patient's daughters Drenda Freeze and [name] and provided above update.  . Informed daughters that another PCS agency will need to be selected due to lack of staffing at [facility].  They confirmed that they still  have list of PCS agencies provided by [facility] . Encouraged daughters to contact selected agency prior to contacting [place] to ensure request can be accommodated.   Liana Gerold to contact PCS agencies on their behalf; daughter stated she would call back if assistance is needed.        Patient Self Care Activities:  . Attends all scheduled provider appointments  Please see past updates related to this goal by clicking on the "Past Updates" button in the selected goal          The patient has been provided with contact information for the care management team and has been advised to call with any health related questions or concerns.     Ronn Melena, [place] Coordination Social Worker [place] [phone]

## 2019-05-14 NOTE — Telephone Encounter (Signed)
Is daughter still giving her lisinopril? How is her cough?

## 2019-05-15 ENCOUNTER — Other Ambulatory Visit: Payer: Self-pay

## 2019-05-15 ENCOUNTER — Ambulatory Visit: Payer: Self-pay | Admitting: *Deleted

## 2019-05-15 ENCOUNTER — Telehealth: Payer: Self-pay | Admitting: *Deleted

## 2019-05-15 ENCOUNTER — Emergency Department (HOSPITAL_COMMUNITY): Payer: Medicare Other

## 2019-05-15 ENCOUNTER — Inpatient Hospital Stay (HOSPITAL_COMMUNITY)
Admission: EM | Admit: 2019-05-15 | Discharge: 2019-05-20 | DRG: 871 | Disposition: A | Discharge status: Skilled Nursing Facility | Payer: Medicare Other | Attending: Internal Medicine | Admitting: Internal Medicine

## 2019-05-15 ENCOUNTER — Encounter: Payer: Medicare Other | Admitting: Internal Medicine

## 2019-05-15 DIAGNOSIS — K5730 Diverticulosis of large intestine without perforation or abscess without bleeding: Secondary | ICD-10-CM | POA: Diagnosis present

## 2019-05-15 DIAGNOSIS — E440 Moderate protein-calorie malnutrition: Secondary | ICD-10-CM | POA: Diagnosis present

## 2019-05-15 DIAGNOSIS — G308 Other Alzheimer's disease: Secondary | ICD-10-CM | POA: Diagnosis not present

## 2019-05-15 DIAGNOSIS — R7401 Elevation of levels of liver transaminase levels: Secondary | ICD-10-CM | POA: Diagnosis not present

## 2019-05-15 DIAGNOSIS — E785 Hyperlipidemia, unspecified: Secondary | ICD-10-CM

## 2019-05-15 DIAGNOSIS — A419 Sepsis, unspecified organism: Secondary | ICD-10-CM | POA: Diagnosis present

## 2019-05-15 DIAGNOSIS — Z66 Do not resuscitate: Secondary | ICD-10-CM | POA: Diagnosis present

## 2019-05-15 DIAGNOSIS — Z20822 Contact with and (suspected) exposure to covid-19: Secondary | ICD-10-CM | POA: Diagnosis present

## 2019-05-15 DIAGNOSIS — E041 Nontoxic single thyroid nodule: Secondary | ICD-10-CM | POA: Diagnosis present

## 2019-05-15 DIAGNOSIS — R443 Hallucinations, unspecified: Secondary | ICD-10-CM | POA: Diagnosis present

## 2019-05-15 DIAGNOSIS — E860 Dehydration: Secondary | ICD-10-CM | POA: Diagnosis present

## 2019-05-15 DIAGNOSIS — Z87891 Personal history of nicotine dependence: Secondary | ICD-10-CM

## 2019-05-15 DIAGNOSIS — K5900 Constipation, unspecified: Secondary | ICD-10-CM | POA: Diagnosis not present

## 2019-05-15 DIAGNOSIS — I10 Essential (primary) hypertension: Secondary | ICD-10-CM | POA: Diagnosis present

## 2019-05-15 DIAGNOSIS — E039 Hypothyroidism, unspecified: Secondary | ICD-10-CM | POA: Diagnosis present

## 2019-05-15 DIAGNOSIS — R1310 Dysphagia, unspecified: Secondary | ICD-10-CM | POA: Diagnosis present

## 2019-05-15 DIAGNOSIS — F0390 Unspecified dementia without behavioral disturbance: Secondary | ICD-10-CM | POA: Diagnosis present

## 2019-05-15 DIAGNOSIS — Z79899 Other long term (current) drug therapy: Secondary | ICD-10-CM

## 2019-05-15 DIAGNOSIS — N390 Urinary tract infection, site not specified: Secondary | ICD-10-CM | POA: Diagnosis not present

## 2019-05-15 DIAGNOSIS — E876 Hypokalemia: Secondary | ICD-10-CM | POA: Diagnosis not present

## 2019-05-15 DIAGNOSIS — R9431 Abnormal electrocardiogram [ECG] [EKG]: Secondary | ICD-10-CM | POA: Diagnosis present

## 2019-05-15 DIAGNOSIS — K219 Gastro-esophageal reflux disease without esophagitis: Secondary | ICD-10-CM | POA: Diagnosis present

## 2019-05-15 DIAGNOSIS — F0281 Dementia in other diseases classified elsewhere with behavioral disturbance: Secondary | ICD-10-CM | POA: Diagnosis not present

## 2019-05-15 DIAGNOSIS — R627 Adult failure to thrive: Secondary | ICD-10-CM | POA: Diagnosis present

## 2019-05-15 DIAGNOSIS — D539 Nutritional anemia, unspecified: Secondary | ICD-10-CM | POA: Diagnosis present

## 2019-05-15 DIAGNOSIS — E162 Hypoglycemia, unspecified: Secondary | ICD-10-CM | POA: Diagnosis not present

## 2019-05-15 DIAGNOSIS — R531 Weakness: Secondary | ICD-10-CM | POA: Diagnosis not present

## 2019-05-15 DIAGNOSIS — M1990 Unspecified osteoarthritis, unspecified site: Secondary | ICD-10-CM | POA: Diagnosis present

## 2019-05-15 DIAGNOSIS — N179 Acute kidney failure, unspecified: Secondary | ICD-10-CM | POA: Diagnosis present

## 2019-05-15 DIAGNOSIS — R1319 Other dysphagia: Secondary | ICD-10-CM | POA: Diagnosis not present

## 2019-05-15 DIAGNOSIS — K5641 Fecal impaction: Secondary | ICD-10-CM | POA: Diagnosis present

## 2019-05-15 DIAGNOSIS — R748 Abnormal levels of other serum enzymes: Secondary | ICD-10-CM | POA: Diagnosis present

## 2019-05-15 DIAGNOSIS — E872 Acidosis: Secondary | ICD-10-CM | POA: Diagnosis present

## 2019-05-15 DIAGNOSIS — M8580 Other specified disorders of bone density and structure, unspecified site: Secondary | ICD-10-CM | POA: Diagnosis present

## 2019-05-15 DIAGNOSIS — G9341 Metabolic encephalopathy: Secondary | ICD-10-CM | POA: Diagnosis present

## 2019-05-15 DIAGNOSIS — Z86711 Personal history of pulmonary embolism: Secondary | ICD-10-CM

## 2019-05-15 DIAGNOSIS — K831 Obstruction of bile duct: Secondary | ICD-10-CM | POA: Diagnosis present

## 2019-05-15 DIAGNOSIS — Z7982 Long term (current) use of aspirin: Secondary | ICD-10-CM

## 2019-05-15 DIAGNOSIS — Z8673 Personal history of transient ischemic attack (TIA), and cerebral infarction without residual deficits: Secondary | ICD-10-CM

## 2019-05-15 DIAGNOSIS — Z885 Allergy status to narcotic agent status: Secondary | ICD-10-CM

## 2019-05-15 LAB — URINALYSIS, ROUTINE W REFLEX MICROSCOPIC
Glucose, UA: NEGATIVE mg/dL
Hgb urine dipstick: NEGATIVE
Ketones, ur: 5 mg/dL — AB
Leukocytes,Ua: NEGATIVE
Nitrite: NEGATIVE
Protein, ur: 100 mg/dL — AB
Specific Gravity, Urine: 1.02 (ref 1.005–1.030)
pH: 5 (ref 5.0–8.0)

## 2019-05-15 LAB — COMPREHENSIVE METABOLIC PANEL
ALT: 79 U/L — ABNORMAL HIGH (ref 0–44)
AST: 103 U/L — ABNORMAL HIGH (ref 15–41)
Albumin: 2.7 g/dL — ABNORMAL LOW (ref 3.5–5.0)
Alkaline Phosphatase: 1270 U/L — ABNORMAL HIGH (ref 38–126)
Anion gap: 19 — ABNORMAL HIGH (ref 5–15)
BUN: 31 mg/dL — ABNORMAL HIGH (ref 8–23)
CO2: 20 mmol/L — ABNORMAL LOW (ref 22–32)
Calcium: 9.9 mg/dL (ref 8.9–10.3)
Chloride: 105 mmol/L (ref 98–111)
Creatinine, Ser: 1.77 mg/dL — ABNORMAL HIGH (ref 0.44–1.00)
GFR calc Af Amer: 30 mL/min — ABNORMAL LOW (ref >60)
GFR calc non Af Amer: 26 mL/min — ABNORMAL LOW (ref >60)
Glucose, Bld: 86 mg/dL (ref 70–99)
Potassium: 4.7 mmol/L (ref 3.5–5.1)
Sodium: 144 mmol/L (ref 135–145)
Total Bilirubin: 4.4 mg/dL — ABNORMAL HIGH (ref 0.3–1.2)
Total Protein: 6.9 g/dL (ref 6.5–8.1)

## 2019-05-15 LAB — BRAIN NATRIURETIC PEPTIDE: B Natriuretic Peptide: 80.5 pg/mL (ref 0.0–100.0)

## 2019-05-15 LAB — CBC WITH DIFFERENTIAL/PLATELET
Abs Immature Granulocytes: 0.09 10*3/uL — ABNORMAL HIGH (ref 0.00–0.07)
Basophils Absolute: 0 10*3/uL (ref 0.0–0.1)
Basophils Relative: 0 %
Eosinophils Absolute: 0 10*3/uL (ref 0.0–0.5)
Eosinophils Relative: 0 %
HCT: 39.2 % (ref 36.0–46.0)
Hemoglobin: 12.3 g/dL (ref 12.0–15.0)
Immature Granulocytes: 1 %
Lymphocytes Relative: 11 %
Lymphs Abs: 1.5 10*3/uL (ref 0.7–4.0)
MCH: 32.5 pg (ref 26.0–34.0)
MCHC: 31.4 g/dL (ref 30.0–36.0)
MCV: 103.7 fL — ABNORMAL HIGH (ref 80.0–100.0)
Monocytes Absolute: 1.2 10*3/uL — ABNORMAL HIGH (ref 0.1–1.0)
Monocytes Relative: 8 %
Neutro Abs: 11.4 10*3/uL — ABNORMAL HIGH (ref 1.7–7.7)
Neutrophils Relative %: 80 %
Platelets: 342 10*3/uL (ref 150–400)
RBC: 3.78 MIL/uL — ABNORMAL LOW (ref 3.87–5.11)
RDW: 15.8 % — ABNORMAL HIGH (ref 11.5–15.5)
WBC: 14.2 10*3/uL — ABNORMAL HIGH (ref 4.0–10.5)
nRBC: 0.1 % (ref 0.0–0.2)

## 2019-05-15 LAB — TSH: TSH: 4.98 u[IU]/mL — ABNORMAL HIGH (ref 0.350–4.500)

## 2019-05-15 LAB — RESPIRATORY PANEL BY RT PCR (FLU A&B, COVID)
Influenza A by PCR: NEGATIVE
Influenza B by PCR: NEGATIVE
SARS Coronavirus 2 by RT PCR: NEGATIVE

## 2019-05-15 LAB — AMMONIA: Ammonia: 34 umol/L (ref 9–35)

## 2019-05-15 LAB — MAGNESIUM: Magnesium: 1.7 mg/dL (ref 1.7–2.4)

## 2019-05-15 LAB — LACTIC ACID, PLASMA: Lactic Acid, Venous: 2.4 mmol/L (ref 0.5–1.9)

## 2019-05-15 MED ORDER — SODIUM CHLORIDE 0.9 % IV BOLUS
1000.0000 mL | Freq: Once | INTRAVENOUS | Status: AC
  Administered 2019-05-15: 1000 mL via INTRAVENOUS

## 2019-05-15 MED ORDER — BISACODYL 5 MG PO TBEC: 5.0000 mg | DELAYED_RELEASE_TABLET | Freq: Every day | ORAL | Status: DC | PRN

## 2019-05-15 MED ORDER — SENNA 8.6 MG PO TABS: 1.0000 | ORAL_TABLET | Freq: Two times a day (BID) | ORAL | Status: DC

## 2019-05-15 MED ORDER — SODIUM CHLORIDE 0.9 % IV SOLN
1.0000 g | Freq: Once | INTRAVENOUS | Status: AC
  Administered 2019-05-15: 1 g via INTRAVENOUS
  Filled 2019-05-15: qty 10

## 2019-05-15 MED ORDER — LORAZEPAM 2 MG/ML IJ SOLN
0.5000 mg | Freq: Once | INTRAMUSCULAR | Status: AC
  Administered 2019-05-15: 0.5 mg via INTRAVENOUS
  Filled 2019-05-15: qty 1

## 2019-05-15 MED ORDER — PANTOPRAZOLE SODIUM 40 MG PO TBEC
40.0000 mg | DELAYED_RELEASE_TABLET | Freq: Every day | ORAL | Status: DC
  Filled 2019-05-15: qty 1

## 2019-05-15 MED ORDER — ASPIRIN 81 MG PO CHEW
81.0000 mg | CHEWABLE_TABLET | Freq: Every day | ORAL | Status: DC
  Filled 2019-05-15: qty 1

## 2019-05-15 MED ORDER — POLYETHYLENE GLYCOL 3350 17 G PO PACK
17.0000 g | PACK | Freq: Every day | ORAL | Status: DC
  Administered 2019-05-18: 17 g via ORAL
  Filled 2019-05-15 (×2): qty 1

## 2019-05-15 MED ORDER — ATORVASTATIN CALCIUM 40 MG PO TABS
40.0000 mg | ORAL_TABLET | Freq: Every day | ORAL | Status: DC
  Filled 2019-05-15: qty 1

## 2019-05-15 MED ORDER — GLYCERIN (LAXATIVE) 2.1 G RE SUPP
1.0000 | Freq: Once | RECTAL | Status: AC
  Administered 2019-05-16: 1 via RECTAL
  Filled 2019-05-15 (×2): qty 1

## 2019-05-15 MED ORDER — ENOXAPARIN SODIUM 30 MG/0.3ML ~~LOC~~ SOLN
30.0000 mg | SUBCUTANEOUS | Status: DC
  Administered 2019-05-16: 30 mg via SUBCUTANEOUS

## 2019-05-15 NOTE — ED Provider Notes (Signed)
Medical screening examination/treatment/procedure(s) were conducted as a shared visit with non-physician practitioner(s) and myself.  I personally evaluated the patient during the encounter.  EKG Interpretation  Date/Time:  Wednesday May 15 2019 14:00:17 EDT Ventricular Rate:  77 PR Interval:    QRS Duration: 97 QT Interval:  477 QTC Calculation: 540 R Axis:   61 Text Interpretation: Normal sinus rhythm Borderline T abnormalities, anterior leads Prolonged QT interval Confirmed by Lorre Nick ([id number]) on 05/15/2019 2:73:51 PM   86 year old female presents with worsening mental status in the last 2 to 3 days.  Chest x-ray shows slight edema.  Urinalysis does show 6-10 white cells concerning for infection.  Will start patient on Rocephin and await for remaining labs and head CT and patient to be admitted   Lorre Nick, MD 05/15/19 1505

## 2019-05-15 NOTE — Telephone Encounter (Signed)
Received message from [place] CM about using [facility] transportation - " I have talked to [facility] transportation and they are not comfortable transporting her when she cannot walk and with  the family being doubtful that they can help."  I called and talked to [place], pt's daughter, informed of the above. Recommended calling EMS to take pt to the ER. State she will do this.

## 2019-05-15 NOTE — ED Provider Notes (Signed)
[facility] EMERGENCY DEPARTMENT Provider Note   CSN: [id number] Arrival date & time: 05/15/19  1240     History Chief Complaint  Patient presents with  . Failure To Thrive    Jennifer Owen is a 86 y.o. female.  The history is provided by a relative and medical records. No language interpreter was used.      86 year old female with hx of dementia, prior stroke, HTN, GERD brought in via EMS accompany by daughter for evaluation changes in mentation.  History obtained through daughter as I am unable to obtain history from patient.  Per daughter, for the past 2 to 3 days patient has a steady decline in her mentation, she is not eating or taking her medication.  She is not drinking much.  She was ambulatory with assistance in the past but now she can hardly stand up.  Daughter found her medication underneath the patient and she worries patient has been taking her medication.  Patient also had an explosive diarrhea this morning.  No report of fever chills or cough no report of urinary complaints.  Family is currently trying to get paperwork to get a home health aide to help with ADL.  Daughter states patient lives with her and daughter provide daily care.  Report patient had a stroke and was hospitalized several months ago.  Was found to be hypothyroidism and was given medication which did help. Patient is scheduled to receive his second Covid vaccination this weekend.     Past Medical History:  Diagnosis Date  . GERD (gastroesophageal reflux disease)    Papulous gastropathy EGD Oct. 2007  . Hyperlipemia   . Hypertension   . IBS (irritable bowel syndrome)   . Low back pain    intermittent radiculopathy  . Osteoarthritis   . Osteopenia    T score-1.1 R [place], - 0.4 L spine  . Polio 1942  . Pulmonary embolism (HCC)    2/2 knee replacement  . Urinary incontinence     Patient Active Problem List   Diagnosis Date Noted  . Hyperthyroidism 03/02/2019  . Acute  delirium 03/02/2019  . Acute encephalopathy 02/27/2019  . Vertigo 10/16/2018  . Chronic cough 01/16/2018  . Dementia ([place]) 03/08/2017  . Headache 10/01/2015  . Dark stools 06/22/2015  . History of CVA (cerebrovascular accident) 06/17/2014  . Healthcare maintenance 06/16/2014  . Frequent falls 04/28/2014  . Bradycardia 11/20/2013  . Hypercalcemia 06/07/2013  . Knee pain, chronic 01/14/2013  . NONEXUDATIVE SENILE MACULAR DEGENERATION RETINA 03/20/2008  . CONSTIPATION, CHRONIC 11/27/2006  . HLD (hyperlipidemia) 12/15/2005  . PULMONARY EMBOLISM, HX OF 12/15/2005  . Essential hypertension 10/26/2005  . GERD 10/26/2005  . Osteoarthritis of multiple joints 10/26/2005  . Knee joint replacement by other means 10/26/2005    Past Surgical History:  Procedure Laterality Date  . ABDOMINAL HYSTERECTOMY  1985  . JOINT REPLACEMENT     total knee - left 1995, right 2004  . OOPHORECTOMY  1985     OB History   No obstetric history on file.     Family History  Problem Relation Age of Onset  . Cancer Daughter        colon caner, <60  . Cancer Mother        lung  . Alzheimer's disease Father   . Heart disease Son     Social History   Tobacco Use  . Smoking status: Former Smoker    Quit date: 1945  Years since quitting: 76.3  . Smokeless tobacco: Never Used  Substance Use Topics  . Alcohol use: No    Alcohol/week: 0.0 standard drinks    Comment: Christmas time only  . Drug use: No    Home Medications Prior to Admission medications   Medication Sig Start Date End Date Taking? Authorizing Provider  acetaminophen (TYLENOL) 500 MG tablet Take 500 mg by mouth every 6 (six) hours as needed for mild pain.    [provider]  amLODipine (NORVASC) 10 MG tablet Take 1 tablet (10 mg total) by mouth daily. 03/11/19   Kathi Ludwig, MD  aspirin 81 MG chewable tablet Chew 81 mg by mouth daily.      [provider]  atorvastatin (LIPITOR) 40 MG tablet TAKE 1 TABLET  BY MOUTH DAILY. Patient taking differently: Take 40 mg by mouth daily.  11/19/18   Bloomfield, Carley D, DO  diclofenac sodium (VOLTAREN) 1 % GEL Apply 2 g upto 4 times a day to your knees as needed for knee pain. Patient taking differently: Apply 2 g topically daily. For knee pain 03/16/15   Ahmed, Chesley Mires, MD  esomeprazole (NEXIUM) 40 MG capsule TAKE 1 CAPSULE BY MOUTH DAILY BEFORE BREAKFAST. Patient taking differently: Take 40 mg by mouth daily.  02/05/19   Bloomfield, Carley D, DO  hydrochlorothiazide (HYDRODIURIL) 25 MG tablet TAKE 1 TABLET BY MOUTH DAILY. Patient taking differently: Take 25 mg by mouth daily.  11/19/18   Bloomfield, Carley D, DO  memantine (NAMENDA) 5 MG tablet TAKE 1 TABLET BY MOUTH 2 TIMES DAILY. Patient taking differently: Take 5 mg by mouth daily.  11/19/18   Bloomfield, Carley D, DO  methimazole (TAPAZOLE) 10 MG tablet TAKE 1 TABLET BY MOUTH DAILY. 04/05/19   Bloomfield, Carley D, DO  QUEtiapine (SEROQUEL) 25 MG tablet Take 2 tablets (50 mg total) by mouth at bedtime. 03/11/19   Kathi Ludwig, MD  simvastatin (ZOCOR) 20 MG tablet Take 20 mg by mouth every evening.  03/22/11  [provider]    Allergies    Tramadol hcl  Review of Systems   Review of Systems  Unable to perform ROS: Dementia    Physical Exam Updated Vital Signs BP 114/72 (BP Location: Right Arm)   Pulse 74   Temp 97.8 F (36.6 C) (Axillary)   Resp 17   SpO2 95%   Physical Exam Vitals and nursing note reviewed.  Constitutional:      General: She is not in acute distress.    Appearance: She is well-developed.     Comments: Elderly female, talking to herself laying in bed in no acute discomfort.  HENT:     Head: Normocephalic and atraumatic.     Mouth/Throat:     Comments: Patient is edentulous to her upper jaw Eyes:     Conjunctiva/sclera: Conjunctivae normal.     Pupils: Pupils are equal, round, and reactive to light.  Cardiovascular:     Rate and Rhythm: Normal rate and  regular rhythm.     Pulses: Normal pulses.     Heart sounds: Normal heart sounds.  Pulmonary:     Effort: Pulmonary effort is normal.     Breath sounds: Normal breath sounds. No wheezing, rhonchi or rales.  Abdominal:     Palpations: Abdomen is soft.     Tenderness: There is no abdominal tenderness.  Musculoskeletal:     Cervical back: Neck supple.     Comments: Moving all 4 extremities equally.  Skin:  Findings: No rash.  Neurological:     Mental Status: She is disoriented.     GCS: GCS eye subscore is 4. GCS verbal subscore is 2. GCS motor subscore is 5.     ED Results / Procedures / Treatments   Labs (all labs ordered are listed, but only abnormal results are displayed) Labs Reviewed  CBC WITH DIFFERENTIAL/PLATELET - Abnormal; Notable for the following components:      Result Value   WBC 14.2 (*)    RBC 3.78 (*)    MCV 103.7 (*)    RDW 15.8 (*)    Neutro Abs 11.4 (*)    Monocytes Absolute 1.2 (*)    Abs Immature Granulocytes 0.09 (*)    All other components within normal limits  COMPREHENSIVE METABOLIC PANEL - Abnormal; Notable for the following components:   CO2 20 (*)    BUN 31 (*)    Creatinine, Ser 1.77 (*)    Albumin 2.7 (*)    AST 103 (*)    ALT 79 (*)    Alkaline Phosphatase 1,270 (*)    Total Bilirubin 4.4 (*)    GFR calc non Af Amer 26 (*)    GFR calc Af Amer 30 (*)    Anion gap 19 (*)    All other components within normal limits  URINALYSIS, ROUTINE W REFLEX MICROSCOPIC - Abnormal; Notable for the following components:   Color, Urine AMBER (*)    APPearance HAZY (*)    Bilirubin Urine SMALL (*)    Ketones, ur 5 (*)    Protein, ur 100 (*)    Bacteria, UA RARE (*)    All other components within normal limits  TSH - Abnormal; Notable for the following components:   TSH 4.980 (*)    All other components within normal limits  RESPIRATORY PANEL BY RT PCR (FLU A&B, COVID)  URINE CULTURE  BRAIN NATRIURETIC PEPTIDE  LACTIC ACID, PLASMA  LACTIC ACID,  PLASMA  AMMONIA  MAGNESIUM    EKG EKG Interpretation  Date/Time:  Wednesday May 15 2019 14:00:17 EDT Ventricular Rate:  77 PR Interval:    QRS Duration: 97 QT Interval:  477 QTC Calculation: 540 R Axis:   61 Text Interpretation: Normal sinus rhythm Borderline T abnormalities, anterior leads Prolonged QT interval Confirmed by Lacretia Leigh ([id number]) on 05/15/2019 2:42:04 PM   Radiology CT ABDOMEN PELVIS WO CONTRAST  Result Date: 05/15/2019 CLINICAL DATA:  86 year old with delirium. Transaminitis. Elevated alk-phos. EXAM: CT ABDOMEN AND PELVIS WITHOUT CONTRAST TECHNIQUE: Multidetector CT imaging of the abdomen and pelvis was performed following the standard protocol without IV contrast. COMPARISON:  Contrast-enhanced exam 05/03/2010 FINDINGS: Lower chest: Subsegmental atelectasis in the left greater than right lower lobe. No pleural fluid. Heart is normal in size. Hepatobiliary: No evidence of focal abnormality allowing for noncontrast exam, arms down positioning, and patient motion. Gallbladder is unremarkable. No biliary dilatation. Pancreas: No ductal dilatation or inflammation. Spleen: Normal in size without focal abnormality. Adrenals/Urinary Tract: No adrenal nodule. No hydronephrosis. No perinephric edema. Mild bilateral renal parenchymal atrophy. Urinary bladder is partially distended, displaced anteriorly by large stool ball in the rectum. Definite bladder wall thickening. Stomach/Bowel: Large inspissated stool ball distending the rectum with rectal distention of 9.3 cm. No definite rectal wall thickening. No pneumatosis. Small to moderate volume of stool throughout the remainder the colon. Mild diverticulosis of the sigmoid without diverticulitis. Appendix not definitively visualized, no evidence of appendicitis. Unremarkable small bowel. Decompressed stomach, partially obscured by motion and  streak artifact from arms down positioning. Vascular/Lymphatic: Aortic atherosclerosis. No aortic  aneurysm. There is scattered phleboliths in the ovarian veins. No bulky abdominopelvic adenopathy. Reproductive: Status post hysterectomy. No adnexal masses. Other: No ascites. No free air or intra-abdominal abscess. Musculoskeletal: Multilevel degenerative change throughout the spine. There are no acute or suspicious osseous abnormalities. IMPRESSION: 1. Large inspissated stool ball distending the rectum with rectal distention of 9.3 cm. No definite rectal wall thickening. Findings suggest fecal impaction. Small to moderate stool burden throughout the remainder of the pelvis. 2. No additional acute abnormality in the abdomen/pelvis. 3. Mild sigmoid diverticulosis without diverticulitis. Aortic Atherosclerosis (ICD10-I70.0). Electronically Signed   By: Keith Rake M.D.   On: 05/15/2019 17:57   CT Head Wo Contrast  Result Date: 05/15/2019 CLINICAL DATA:  Hallucinations, dementia. EXAM: CT HEAD WITHOUT CONTRAST TECHNIQUE: Contiguous axial images were obtained from the base of the skull through the vertex without intravenous contrast. COMPARISON:  February 27, 2019. FINDINGS: Brain: Mild chronic ischemic white matter disease is noted. Old right occipital infarction is noted. No mass effect or midline shift is noted. Ventricular size is within normal limits. There is no evidence of mass lesion, hemorrhage or acute infarction. Vascular: No hyperdense vessel or unexpected calcification. Skull: Normal. Negative for fracture or focal lesion. Sinuses/Orbits: Large right maxillary mucous retention cyst is noted. Other: None. IMPRESSION: Mild chronic ischemic white matter disease. Old right occipital infarction. No acute intracranial abnormality seen. Electronically Signed   By: Marijo Conception M.D.   On: 05/15/2019 17:52   US Abdomen Limited  Result Date: 05/15/2019 CLINICAL DATA:  Elevated LFT EXAM: ULTRASOUND ABDOMEN LIMITED RIGHT UPPER QUADRANT COMPARISON:  CT 05/15/2019 FINDINGS: Gallbladder: Sludge in the  gallbladder. Normal wall thickness. No shadowing stone. Unable to assess sonographic Percell Miller due to altered mental status. Common bile duct: Diameter: Not visualized secondary to bowel gas. Liver: No focal lesion identified. Within normal limits in parenchymal echogenicity. Left hepatic lobe poorly visible due to bowel gas. Portal vein is patent on color Doppler imaging with normal direction of blood flow towards the liver. Other: None. IMPRESSION: 1. Gallbladder sludge without other sonographic features to suggest acute cholecystitis 2. Nonvisualized common bile duct and left hepatic lobe secondary to bowel gas. Ultrasound appearance of the visualized portions of the liver is within normal limits. Electronically Signed   By: Donavan Foil M.D.   On: 05/15/2019 18:08   DG Chest Portable 1 View  Result Date: 05/15/2019 CLINICAL DATA:  Altered mental status EXAM: PORTABLE CHEST 1 VIEW COMPARISON:  02/27/2019 FINDINGS: Stable heart size with prominent vascular structures in the bilateral hilar regions. Mild pulmonary vascular congestion and interstitial prominence suggesting mild edema. Lung volumes are low. No large pleural fluid collection. No pneumothorax. IMPRESSION: Mild pulmonary vascular congestion and interstitial prominence suggesting mild edema. Electronically Signed   By: Davina Poke D.O.   On: 05/15/2019 14:45    Procedures Procedures (including critical care time)  Medications Ordered in ED Medications  cefTRIAXone (ROCEPHIN) 1 g in sodium chloride 0.9 % 100 mL IVPB (0 g Intravenous Stopped 05/15/19 1633)  LORazepam (ATIVAN) injection 0.5 mg (0.5 mg Intravenous Given 05/15/19 1555)  LORazepam (ATIVAN) injection 0.5 mg (0.5 mg Intravenous Given 05/15/19 1631)    ED Course  I have reviewed the triage vital signs and the nursing notes.  Pertinent labs & imaging results that were available during my care of the patient were reviewed by me and considered in my medical decision making (see chart  for details).    MDM Rules/Calculators/A&P                      BP (!) 150/73   Pulse 75   Temp (!) 96.4 F (35.8 C) (Rectal)   Resp 19   SpO2 100%   Final Clinical Impression(s) / ED Diagnoses Final diagnoses:  Lower urinary tract infectious disease  Transaminitis  Constipation, unspecified constipation type  Hypothyroidism, unspecified type  Generalized weakness    Rx / DC Orders ED Discharge Orders    None     2:12 PM This is an 86 year old female with history of dementia, prior stroke, presenting with increased confusion for the past few days.  She does not appear to be in any acute discomfort, no infectious symptoms aside from diarrhea.  Soft nontender abdomen on exam.  No obvious focal neuro deficit.  Work-up initiated.  3:06 PM UA with finding suggest of mild UTI, urine culture sent, will give rocephin. CXR shows mild pulmonary vascular congestion and mild edema, will check BNP.  Anticipate admission. Care discussed with Dr. Zenia Resides.   5:08 PM Patient with elevated WBC of 14.2.  Mild AKI with a BUN of 31, creatinine 1.77.  Markedly abnormal liver function with AST 103, ALT 79, alk phos 1270, total bili 4.4 and an anion gap of 19.  She also has a TSH of 4.98, likely reflect hypothyroidism.  BNP is currently pending. EKG shows a prolonged QT.  Will check mag level.   The setting of abnormal liver function, will obtain abdominal pelvis CT scan as well as limited abdominal ultrasound as this could be due to a obstructive biliary pathology.   6:43 PM Screening COVID-19 test is negative.  Limited abdominal ultrasound demonstrated gallbladder sludge without other sonographic features suggest acute cholecystitis.  Nonvisualized common bile duct and left hepatic lobe due to bowel gas.  An abdominal pelvis CT scan demonstrate signs of stool impaction without rectal wall thickening.  Mild sigmoid deficits diverticulosis without diverticulitis.  We will consult medicine for  admission. Rectal temp of 96.4.  Will place on [facility].   6:59 PM  Appreciate consultation from Internal Medicine resident who agrees to see and admit pt for further work up.   Jennifer Owen was evaluated in Emergency Department on 05/15/2019 for the symptoms described in the history of present illness. She was evaluated in the context of the global COVID-19 pandemic, which necessitated consideration that the patient might be at risk for infection with the SARS-CoV-2 virus that causes COVID-19. Institutional protocols and algorithms that pertain to the evaluation of patients at risk for COVID-19 are in a state of rapid change based on information released by regulatory bodies including the CDC and federal and state organizations. These policies and algorithms were followed during the patient's care in the ED.    Domenic Moras, PA-C 05/15/19 1900    Lacretia Leigh, MD 05/20/19 [phone]

## 2019-05-15 NOTE — ED Notes (Signed)
Pt incontinent of stool.  Pt cleaned, linens changed, and pt repositioned.

## 2019-05-15 NOTE — Telephone Encounter (Addendum)
Talked to Dr Daleen Bo who suggested talking to CCM.   I talked to Janet,CM who will try to arrange transportation for 1345 PM appt today with Dr Chesley Mires.  Also called/  informed [name].

## 2019-05-15 NOTE — Telephone Encounter (Signed)
F/U - per Epic, pt is currently in the ER.

## 2019-05-15 NOTE — Telephone Encounter (Signed)
Call from pt's daughter,Vanessa - stated pt is not eating, drinking, walking nor taking her medications. Screaming and talking to people who has been dead. And has not slept for 24 hours. Offered appt for today with pt's PCP but stated since pt is not walking, she will have to call and arrange transportation which she stated pt "may not last that long".

## 2019-05-15 NOTE — Progress Notes (Signed)
Internal Medicine Clinic Resident   I have personally reviewed this encounter including the documentation in this note and/or discussed this patient with the care management provider. I will address any urgent items identified by the care management provider and will communicate my actions to the patient's PCP. I have reviewed the patient's CCM visit with my supervising attending.  Ciera Beckum, MD 05/15/2019    

## 2019-05-15 NOTE — ED Notes (Signed)
Pt trying to remove bearhugger. Rectal temp checked, 97.5 Bearhugger removed and warm blankets placed on pt.

## 2019-05-15 NOTE — Chronic Care Management (AMB) (Signed)
  Chronic Care Management   Note  05/15/2019 Name: Jennifer Owen MRN: [id number] DOB: 09-24-1931  Asked by clinic staff nurse Chinita Pester to assist with securing urgent transportation for this patient to the clinic for a 1:45 pm appointment after patient's daughter called clinic with concerns about patient. Contacted [facility] transportation; due to patient's nonambulatory status and family's hesitance in assisting patient to a car, [facility] transportation by [employer] or [facility] is not appropriate. Contacted patient's daughter Drenda Freeze to advise her if she feels patient needs to be seen urgently and they do not feel comfortable transporting her, to call for non emergency ambulance transportation. Drenda Freeze voiced understanding.   Follow up plan: The patient's family  has been provided with contact information for the care management team and has been advised to call with any health related questions or concerns.    Cranford Mon RN, CCM, CDCES CCM Clinic RN Care Manager [phone]

## 2019-05-15 NOTE — ED Notes (Signed)
Bladder scan < 75 mL

## 2019-05-15 NOTE — ED Triage Notes (Signed)
Pt from home with daughter. Pt has dementia. Daughter states that pt has decreased in her oral intake and hasnt taken her meds. Increase in hallucinations as well. Pt doesn't follow commands or answer questions. Pt just rambling.

## 2019-05-15 NOTE — ED Notes (Signed)
RN applied bear hugger to pt .

## 2019-05-15 NOTE — Hospital Course (Addendum)
Admitted 05/15/2019  Allergies: Tramadol hcl Pertinent Hx: dementia, prior stroke, HTN, GERD, hyperthyroidism, PE after knee replacement surgery, hyperthyroidism with cold and warm nodule  86 y.o. female p/w poor p.o. intake, altered mental status, explosive diarrhea this morning. lives with her aughter   *Sepsis: Likely 2/2 UTI.  Hypothermic on arrival. Leukocytosis 14.2, LA 2.4.  Pending UC. Started Rocephin in ED. Giving IVF. Has bearhug. Trending LA.  *AMS: Likely in setting of sepsis, constipation and poor p.o. intake .  CT head negative for acute changes.  Giving antibiotic and IV fluids.  Swallow eval.  *AKI: Cr1.77, BUN 31.  Likely prerenal.  Given IV fluid.  Also doing bladder scan to rule out postrenal.  *Elevated liver enzymes:AST/ALT 103/79, Alk P 1270, total bili 4.4. No abd pain. RUQ [place] with gallbladder sludge without acute cholecystitis nonvisualized CBD. Checking GGT.   *Constipation: CT w large inspissated stool ball distending the rectum to 9 cm. Can be due to hypothyroid state by over treatment with methimasol (TSH 4.9. But also can be unreliable in acute disease). Giving IV fluids, Glycerine sup. Holding Methimazole. May need enema.    Home meds: Amlodipine, ASA, Lipitor, Nexium, HCTZ, memantine, methimazole, quetiapine, Started on ceftriaxone and received Ativan in ED    Consults: none Meds: Ceftriaxone, VTE ppx: Lovenox IVF: NS bolus diet: N.p.o. until passes swallow eval  Chest x-ray mild pulmonary vascular congestion.  EF 60-65%, grade 1 DD Prolonged QT   UTI Constipation Elevated alkaline phosphatase:CT abdomen pelvis fecal impaction small to moderate stool burden  *Hypothyroid state: TSH 4.98 on arrival. Can be contributing with constipation.  Hospital admission 2/17-2/24 due to AMS when she found to have hyperthyroidism, warm and cold thyroid nodule.  Started on methimazole that hospitalization.  F/U TFT was nit checked. Holding Methimazole.

## 2019-05-15 NOTE — Telephone Encounter (Signed)
Thank you, Glenda!  

## 2019-05-15 NOTE — H&P (Signed)
Date: 05/15/2019               Patient Name:  Jennifer Owen MRN: [id number]  DOB: 02-03-1931 Age / Sex: 86 y.o., female   PCP: Delice Bison, DO         Medical Service: Internal Medicine Teaching Service         Attending Physician: Dr. Sid Falcon, MD    First Contact: Dr. Jose Persia Pager: [phone]  Second Contact: Dr. Caro Laroche  Pager: [phone]       After Hours (After 5p/  First Contact Pager: [phone]  weekends / holidays): Second Contact Pager: [phone]   Chief Complaint: altered mental status  History of Present Illness: Jennifer Owen is a 86 year old F with significant PMH of dementia, hyperthyroidism, hypertension, and GERD, who was brought to the ED by EMS for change in mentation per pt's daughter. Pt unable to participate secondary to known dementia. History obtained per family and chart review. Daughter states pt has a 2 day history of abnormal behavior including poor PO intake, not swallowing her medications, increased confusion and hallucinations from baseline. Daughter described pt has taking sips or bites of food into her mouth but not swallowing them. Denies coughing or choking episodes. Two days ago pt had white residue over the side of her face and pills in/around her bed, so daughter believes pt had been holding pills in her mouth before spitting them out later. Denies fevers, chills, vomiting, cough, or decreased urine output. Daughter notes two episodes of urinary urgency over the last 5 days where pt exclaimed "I have to pee, I have to pee." Pt largely unable to express subjective complaints like pain or nausea at her baseline. Daughter endorses pt having bowel movements during this time, most recently a "blow out" of diarrhea this morning. Pt previously ambulated with a cane at home, and for the last 5-7 days has not been able to stand/ambulate. Daughter requires additional help from family to get pt moved or into the bath. Pt is entirely  dependent on daughter/family for all of her ADLs and IADLs. Daughter does say that prior behavior changes had been attributed to pt's thyroid issues.  On EMS arrival today, pt was unable to answer direct questions and babbling nonsensically. In the ED, pt was temp 97.48F, HR 74, RR 17, BP 114/72, and O2 saturation 95% on room air. Lab work significant for leukocytosis of 14.2 with elevated absolute neutrophil count, bicarb 20, BUN 31, Cr 1.77 (from baseline 0.87), and anion gap 19. Lactate mildly elevated to 2.4. Alk phos elevated to 1,270, AST 103, ALT 79, and T bili 4.4. BNP normal. TSH elevated to 4.980. UA with negative nitrites/leukocytes, 6-10 WBCs, 0-5 RBCs, and rare bacteria. CXR suggesting mild edema. CT head negative for acute process. RUQ with gallbladder sludge without features of acute cholecystitis. CT abdomen with large stool ball in distending rectum, no rectal wall thickening, and mild/moderate stool burden throughout the remainder of the pelvis. Pt received 1g ceftriaxone and two doses of 0.50m IV lorazepam in the ED. IMTS called for admission.  Meds:  Current Meds  Medication Sig  . acetaminophen (TYLENOL) 500 MG tablet Take 1,000 mg by mouth in the morning.   .Marland KitchenamLODipine (NORVASC) 10 MG tablet Take 1 tablet (10 mg total) by mouth daily.  .Marland Kitchenaspirin 81 MG chewable tablet Chew 81 mg by mouth daily.    .Marland Kitchenatorvastatin (LIPITOR) 40 MG tablet TAKE 1 TABLET  BY MOUTH DAILY. (Patient taking differently: Take 40 mg by mouth daily. )  . esomeprazole (NEXIUM) 40 MG capsule TAKE 1 CAPSULE BY MOUTH DAILY BEFORE BREAKFAST. (Patient taking differently: Take 40 mg by mouth daily. )  . memantine (NAMENDA) 5 MG tablet TAKE 1 TABLET BY MOUTH 2 TIMES DAILY. (Patient taking differently: Take 5 mg by mouth 2 (two) times daily. )  . methimazole (TAPAZOLE) 10 MG tablet TAKE 1 TABLET BY MOUTH DAILY. (Patient taking differently: Take 10 mg by mouth daily. )  . QUEtiapine (SEROQUEL) 25 MG tablet Take 2 tablets  (50 mg total) by mouth at bedtime.   Allergies: Allergies as of 05/15/2019 - Review Complete 05/15/2019  Allergen Reaction Noted  . Tramadol hcl Nausea Only and Other (See Comments)    Past Medical History:  Diagnosis Date  . GERD (gastroesophageal reflux disease)    Papulous gastropathy EGD Oct. 2007  . Hyperlipemia   . Hypertension   . IBS (irritable bowel syndrome)   . Low back pain    intermittent radiculopathy  . Osteoarthritis   . Osteopenia    T score-1.1 R [place], - 0.4 L spine  . Polio 1942  . Pulmonary embolism (HCC)    2/2 knee replacement  . Urinary incontinence    Family History:  Family History  Problem Relation Age of Onset  . Cancer Daughter        colon caner, <60  . Cancer Mother        lung  . Alzheimer's disease Father   . Heart disease Son    Social History:  Pt lives at home with her daughter (and [place]). Daughter, Jennifer Owen, helps pt with all of her ADLs and IADLs. Jennifer Owen is working to get additional help within the home to care for her mother. At baseline, pt is able to ambulate with assistance using a cane or walker. Over the last week or so, pt has been unable to walk so Jennifer Owen has exclusively been using a wheelchair for her mom.  Social History   Tobacco Use  . Smoking status: Former Smoker    Quit date: 1945    Years since quitting: 76.3  . Smokeless tobacco: Never Used  Substance Use Topics  . Alcohol use: No    Alcohol/week: 0.0 standard drinks    Comment: Christmas time only  . Drug use: No   Review of Systems: Pt unable to participate in ROS. Daughter provides the ROS as she is able. Review of Systems  Constitutional: Positive for malaise/fatigue. Negative for chills, diaphoresis and fever.  HENT: Negative for congestion and sore throat.   Respiratory: Negative for cough and shortness of breath.   Cardiovascular: Negative for chest pain, palpitations and leg swelling.  Gastrointestinal: Positive for diarrhea. Negative for abdominal pain,  constipation, nausea and vomiting.  Genitourinary: Negative for dysuria and hematuria.  Musculoskeletal: Negative for back pain and falls.  Neurological: Positive for weakness (generalized). Negative for tremors and focal weakness.  Endo/Heme/Allergies: Negative for polydipsia.  Psychiatric/Behavioral: Positive for hallucinations (increasing in frequency).   Physical Exam: Blood pressure 123/69, pulse 93, temperature (!) 97.5 F (36.4 C), temperature source Rectal, resp. rate 18, weight 78 kg, SpO2 94 %. Physical Exam Vitals and nursing note reviewed.  Constitutional:      General: She is sleeping. She is not in acute distress.    Appearance: She is not ill-appearing.     Comments: Pt lethargic and sleeping. No acute distress. Stirs to voice, but does not follow  commands.  HENT:     Head: Normocephalic and atraumatic.     Mouth/Throat:     Mouth: Mucous membranes are dry.  Eyes:     General: Scleral icterus present.  Cardiovascular:     Rate and Rhythm: Normal rate and regular rhythm.     Heart sounds: Normal heart sounds.  Pulmonary:     Effort: Pulmonary effort is normal.     Breath sounds: Normal breath sounds. No wheezing, rhonchi or rales.  Abdominal:     General: Abdomen is flat. Bowel sounds are normal. There is no distension.     Palpations: Abdomen is soft.     Tenderness: There is no abdominal tenderness. There is no guarding or rebound.  Musculoskeletal:     Right lower leg: No edema.     Left lower leg: No edema.  Skin:    General: Skin is dry.     Comments: Skin warm with bear hugger in place.  Neurological:     Mental Status: She is lethargic.     Comments: Unable to obtain full neuro exam secondary to pt's inability to cooperate with exam. Moves all extremities spontaneously.  Psychiatric:     Comments: Pt not interactive with examination    EKG: personally reviewed my interpretation is normal sinus rhythm, prolonged QT, baseline artifact  CXR: personally  reviewed my interpretation is stable prominent vasculature in bilateral hilar regions, low lung volumes, no focal opacity, no pleural effusion, no pneumothorax, no bony abnormality.  Assessment & Plan by Problem: Active Problems:   UTI (urinary tract infection)   Sepsis ([place])  Ms. Redmond is a 86 year old F with significant PMH of dementia, hyperthyroidism, hypertension, and GERD, who was brought in by her daughter for AMS and poor PO intake.  Altered mental status Sepsis UTI Pt presentation concerning for altered mental status secondary to sepsis and infectious etiology. CBC with leukocytosis to 14.2 and elevated absolute neutrophil count. No documented fevers, but hypothermic on rectal temp requiring bear hugger. Vitals otherwise stable, no tachycardia or hypotension (though BP readings inconsistent given pt movement and ankle cuff with SBP ranging from 100 to 160). CT head negative. Concern for urologic source at this time given episode of urgency at home and WBCs on UA. However, intraabdominal source possible given cholestatic picture on pt's hepatic function panel.  - received a one time dose of ceftriaxone in the ED - 1L NS bolus - repeat lactate - urine culture - blood cultures - SLP eval given pt not swallowing at home - PT/OT - holding home seroquel  Cholestatic liver injury Elevated LFTs Lab results on admission with alk phos 1,270, t bili 4.4, AST 103, and ALT 79. Examination without RUQ tenderness to palpitation or abdominal distension, rebound, or guarding. RUQ with gallbladder sludge without other sonographic features to suggest acute cholecystitis. CBD and left hepatic lobe unable to be visualized due to bowel gas. Visualized portions of the liver within normal limits. CT abdomen with unremarkable gallbladder and no biliary dilatation. Discussed imaging findings with radiology, Dr. Francoise Ceo, who measured CBD on CT abdomen to 54m indicative of the upper limit of normal for the  pt's age. No evidence of calcified stones within the duct. No intrahepatic obstruction. Recommended MRCP next if pt's lab remain abnormal and there continues to be concern for extrahepatic obstruction. - checking GGT - CMP in AM - next steps would be MRCP vs further lab testing with anti-mitochondrial antibodies   Constipation Hyperthyroidism secondary to overactive  thyroid nodule CT abdomen with large inspissated stool ball distending the rectum with rectal distention of 9.3 cm. No definite rectal wall thickening. Findings suggest fecal impaction. Small to moderate stool burden throughout the remainder of the pelvis. Pt's daughter endorses continued bowel function. Exam reassuring without distension, tenderness, rebound, or guarding. Pt has history of hyperthyroidism with RAI scan showing small warm nodule at the superior pole of the right thyroid lobe and a cold nodule at inferior right lobe, and possibly in the inferior left lobe. On methimazole 57m daily. TSH on admission elevated to 4.980.  - suspect constipation could be secondary to iatrogenic hypothyroidism from home methimazole and pt is having overflow diarrhea around stool ball - doubt pt would be able to take sufficient PO miralax - glycerin suppository - may need to escalate to enema  - holding methimazole   AKI Cr on admission 1.77 from prior baseline of 0.87 two months ago. Suspect prerenal etiology secondary to pt's decreased PO intake for two days. - IV fluids - f/u AM BMP - bladder scan given potential obstruction from stool ball   Hypertension - holding home amlodipine and HCTZ   Diet - NPO Fluids - 1L NS bolus DVT ppx - enoxaparin CODE STATUS - DNR  Dispo: Admit patient to Inpatient with expected length of stay greater than 2 midnights.  Signed: JLadona Horns MD 05/15/2019, 10:08 PM  Pager: [phone]

## 2019-05-16 ENCOUNTER — Encounter (HOSPITAL_COMMUNITY): Payer: Self-pay | Admitting: Internal Medicine

## 2019-05-16 DIAGNOSIS — R531 Weakness: Secondary | ICD-10-CM

## 2019-05-16 DIAGNOSIS — R7401 Elevation of levels of liver transaminase levels: Secondary | ICD-10-CM

## 2019-05-16 DIAGNOSIS — N390 Urinary tract infection, site not specified: Secondary | ICD-10-CM

## 2019-05-16 DIAGNOSIS — K5900 Constipation, unspecified: Secondary | ICD-10-CM

## 2019-05-16 DIAGNOSIS — E039 Hypothyroidism, unspecified: Secondary | ICD-10-CM

## 2019-05-16 HISTORY — DX: Urinary tract infection, site not specified: N39.0

## 2019-05-16 LAB — CBC
HCT: 31.6 % — ABNORMAL LOW (ref 36.0–46.0)
Hemoglobin: 10.3 g/dL — ABNORMAL LOW (ref 12.0–15.0)
MCH: 33.2 pg (ref 26.0–34.0)
MCHC: 32.6 g/dL (ref 30.0–36.0)
MCV: 101.9 fL — ABNORMAL HIGH (ref 80.0–100.0)
Platelets: 309 10*3/uL (ref 150–400)
RBC: 3.1 MIL/uL — ABNORMAL LOW (ref 3.87–5.11)
RDW: 16 % — ABNORMAL HIGH (ref 11.5–15.5)
WBC: 13.8 10*3/uL — ABNORMAL HIGH (ref 4.0–10.5)
nRBC: 0 % (ref 0.0–0.2)

## 2019-05-16 LAB — COMPREHENSIVE METABOLIC PANEL
ALT: 72 U/L — ABNORMAL HIGH (ref 0–44)
AST: 80 U/L — ABNORMAL HIGH (ref 15–41)
Albumin: 2.4 g/dL — ABNORMAL LOW (ref 3.5–5.0)
Alkaline Phosphatase: 1205 U/L — ABNORMAL HIGH (ref 38–126)
Anion gap: 14 (ref 5–15)
BUN: 24 mg/dL — ABNORMAL HIGH (ref 8–23)
CO2: 24 mmol/L (ref 22–32)
Calcium: 9.5 mg/dL (ref 8.9–10.3)
Chloride: 110 mmol/L (ref 98–111)
Creatinine, Ser: 1.34 mg/dL — ABNORMAL HIGH (ref 0.44–1.00)
GFR calc Af Amer: 41 mL/min — ABNORMAL LOW (ref >60)
GFR calc non Af Amer: 36 mL/min — ABNORMAL LOW (ref >60)
Glucose, Bld: 86 mg/dL (ref 70–99)
Potassium: 2.6 mmol/L — CL (ref 3.5–5.1)
Sodium: 148 mmol/L — ABNORMAL HIGH (ref 135–145)
Total Bilirubin: 3 mg/dL — ABNORMAL HIGH (ref 0.3–1.2)
Total Protein: 6.8 g/dL (ref 6.5–8.1)

## 2019-05-16 LAB — IRON AND TIBC
Iron: 40 ug/dL (ref 28–170)
Saturation Ratios: 19 % (ref 10.4–31.8)
TIBC: 206 ug/dL — ABNORMAL LOW (ref 250–450)
UIBC: 166 ug/dL

## 2019-05-16 LAB — BASIC METABOLIC PANEL
Anion gap: 15 (ref 5–15)
BUN: 20 mg/dL (ref 8–23)
CO2: 24 mmol/L (ref 22–32)
Calcium: 9.8 mg/dL (ref 8.9–10.3)
Chloride: 109 mmol/L (ref 98–111)
Creatinine, Ser: 1.15 mg/dL — ABNORMAL HIGH (ref 0.44–1.00)
GFR calc Af Amer: 50 mL/min — ABNORMAL LOW (ref >60)
GFR calc non Af Amer: 43 mL/min — ABNORMAL LOW (ref >60)
Glucose, Bld: 73 mg/dL (ref 70–99)
Potassium: 3.1 mmol/L — ABNORMAL LOW (ref 3.5–5.1)
Sodium: 148 mmol/L — ABNORMAL HIGH (ref 135–145)

## 2019-05-16 LAB — GAMMA GT: GGT: 859 U/L — ABNORMAL HIGH (ref 7–50)

## 2019-05-16 LAB — CBG MONITORING, ED: Glucose-Capillary: 60 mg/dL — ABNORMAL LOW (ref 70–99)

## 2019-05-16 LAB — LIPASE, BLOOD: Lipase: 26 U/L (ref 11–51)

## 2019-05-16 LAB — LACTIC ACID, PLASMA
Lactic Acid, Venous: 0.8 mmol/L (ref 0.5–1.9)
Lactic Acid, Venous: 1.4 mmol/L (ref 0.5–1.9)

## 2019-05-16 LAB — PROTIME-INR
INR: 1.1 (ref 0.8–1.2)
Prothrombin Time: 14 seconds (ref 11.4–15.2)

## 2019-05-16 LAB — FOLATE: Folate: 4.6 ng/mL — ABNORMAL LOW (ref >5.9)

## 2019-05-16 LAB — FERRITIN: Ferritin: 936 ng/mL — ABNORMAL HIGH (ref 11–307)

## 2019-05-16 LAB — MAGNESIUM: Magnesium: 1.5 mg/dL — ABNORMAL LOW (ref 1.7–2.4)

## 2019-05-16 LAB — VITAMIN D 25 HYDROXY (VIT D DEFICIENCY, FRACTURES): Vit D, 25-Hydroxy: 9.96 ng/mL — ABNORMAL LOW (ref 30–100)

## 2019-05-16 MED ORDER — DEXTROSE 50 % IV SOLN
1.0000 | Freq: Once | INTRAVENOUS | Status: AC
  Administered 2019-05-16: 50 mL via INTRAVENOUS
  Filled 2019-05-16: qty 50

## 2019-05-16 MED ORDER — ENOXAPARIN SODIUM 40 MG/0.4ML ~~LOC~~ SOLN
40.0000 mg | SUBCUTANEOUS | Status: DC
  Administered 2019-05-17 – 2019-05-20 (×4): 40 mg via SUBCUTANEOUS
  Filled 2019-05-16 (×4): qty 0.4

## 2019-05-16 MED ORDER — BISACODYL 10 MG RE SUPP
10.0000 mg | Freq: Once | RECTAL | Status: DC
  Filled 2019-05-16: qty 1

## 2019-05-16 MED ORDER — LACTATED RINGERS IV SOLN: INTRAVENOUS | Status: DC

## 2019-05-16 MED ORDER — POTASSIUM CHLORIDE CRYS ER 20 MEQ PO TBCR: 40.0000 meq | EXTENDED_RELEASE_TABLET | Freq: Two times a day (BID) | ORAL | Status: DC

## 2019-05-16 MED ORDER — POTASSIUM CHLORIDE 10 MEQ/100ML IV SOLN
10.0000 meq | INTRAVENOUS | Status: AC
  Administered 2019-05-16 (×4): 10 meq via INTRAVENOUS
  Filled 2019-05-16 (×6): qty 100

## 2019-05-16 MED ORDER — SODIUM CHLORIDE 0.9 % IV BOLUS
1000.0000 mL | Freq: Once | INTRAVENOUS | Status: AC
  Administered 2019-05-16: 1000 mL via INTRAVENOUS

## 2019-05-16 MED ORDER — RAMELTEON 8 MG PO TABS
8.0000 mg | ORAL_TABLET | Freq: Every day | ORAL | Status: DC
  Filled 2019-05-16 (×5): qty 1

## 2019-05-16 MED ORDER — MAGNESIUM SULFATE 2 GM/50ML IV SOLN
2.0000 g | Freq: Once | INTRAVENOUS | Status: AC
  Administered 2019-05-16: 2 g via INTRAVENOUS
  Filled 2019-05-16: qty 50

## 2019-05-16 MED ORDER — SODIUM CHLORIDE 0.9 % IV SOLN: 1.0000 g | INTRAVENOUS | Status: DC

## 2019-05-16 MED ORDER — POTASSIUM CHLORIDE 10 MEQ/100ML IV SOLN
10.0000 meq | INTRAVENOUS | Status: AC
  Administered 2019-05-16 (×6): 10 meq via INTRAVENOUS
  Filled 2019-05-16 (×6): qty 100

## 2019-05-16 MED ORDER — DEXTROSE IN LACTATED RINGERS 5 % IV SOLN: INTRAVENOUS | Status: DC

## 2019-05-16 NOTE — Evaluation (Addendum)
Occupational Therapy Evaluation Patient Details Name: Jennifer Owen MRN: [id number] DOB: 06/26/1931 Today's Date: 05/16/2019    History of Present Illness Pt is an 86 y/o female admitted secondary to sepsis from UTI. PMH includes dementia, HTN, polio and PE.    Clinical Impression   PTA pt living with daughter, whom provided history 2/2 patient cognitive status. Pt presents with ability to complete bed mobility and sit <> stand with max A +2 with multimodal cueing. She currently is dependent on BADLs 2/2 cognitive status. This is patient's baseline given dementia diagnosis. Pt incontinent x3 during session, pulling at lines and taking off clothes.  Recommend pt d/c to long term facility for custodial care. OT will sign off, thank you for this consult.    Follow Up Recommendations  SNF;Other (comment)(long term memory care)    Equipment Recommendations  Other (comment)(Hoyer lift for home)    Recommendations for Other Services       Precautions / Restrictions Precautions Precautions: Fall Restrictions Weight Bearing Restrictions: No      Mobility Bed Mobility Overal bed mobility: Needs Assistance Bed Mobility: Rolling;Supine to Sit;Sit to Supine Rolling: Max assist;+2 for physical assistance   Supine to sit: Max assist;+2 for physical assistance Sit to supine: Max assist;+2 for physical assistance   General bed mobility comments: Max A +2 to roll from side to side for clean up. Required clean up 3X during session. Pt resistive to rolling.  Max A for trunk assist and LE assist to perform supine<>sit.   Transfers Overall transfer level: Needs assistance Equipment used: 2 person hand held assist Transfers: Sit to/from Stand Sit to Stand: Max assist;+2 physical assistance         General transfer comment: Pt performed partial stand with very flexed posture. Unable to come to full upright.     Balance Overall balance assessment: Needs assistance Sitting-balance  support: Feet supported;No upper extremity supported Sitting balance-Leahy Scale: Poor Sitting balance - Comments: min to min guard A for maintenance of sitting balance as pt would tend to try and lay down.                                    ADL either performed or assessed with clinical judgement   ADL Overall ADL's : At baseline                                       General ADL Comments: pt is at baseline level of BADL at this time. She is currently dependent for all ADL due to cognitive status.     Vision Patient Visual Report: No change from baseline       Perception     Praxis      Pertinent Vitals/Pain Pain Assessment: No/denies pain Faces Pain Scale: No hurt     Hand Dominance Right   Extremity/Trunk Assessment Upper Extremity Assessment Upper Extremity Assessment: Overall WFL for tasks assessed   Lower Extremity Assessment Lower Extremity Assessment: Defer to PT evaluation   Cervical / Trunk Assessment Cervical / Trunk Assessment: Kyphotic   Communication Communication Communication: Expressive difficulties   Cognition Arousal/Alertness: Awake/alert Behavior During Therapy: Restless;WFL for tasks assessed/performed Overall Cognitive Status: History of cognitive impairments - at baseline  General Comments: Dementia at baseline. Minimal purposeful command following- needs assist for all tasks   General Comments  Pt's daughter present.     Exercises     Shoulder Instructions      Home Living Family/patient expects to be discharged to:: Private residence Living Arrangements: Children Available Help at Discharge: Family;Available 24 hours/day Type of Home: House Home Access: Stairs to enter [employer] of Steps: 2 Entrance Stairs-Rails: Right;Left Home Layout: One level     Bathroom Shower/Tub: Chief Strategy Officer: Standard     Home Equipment:  Cane - single point;Shower seat;Wheelchair - manual          Prior Functioning/Environment Level of Independence: Needs assistance  Gait / Transfers Assistance Needed: Requires assist for transfers into [facility] and for navigation with steps.  ADL's / Homemaking Assistance Needed: Family assists with sponge baths, dressing, toileting            OT Problem List: Decreased knowledge of use of DME or AE;Decreased cognition;Decreased safety awareness;Decreased activity tolerance      OT Treatment/Interventions:      OT Goals(Current goals can be found in the care plan section) Acute Rehab OT Goals Patient Stated Goal: for pt to return home per daughter OT Goal Formulation: All assessment and education complete, DC therapy  OT Frequency:     Barriers to D/C:            Co-evaluation PT/OT/SLP Co-Evaluation/Treatment: Yes Reason for Co-Treatment: Necessary to address cognition/behavior during functional activity;To address functional/ADL transfers;For patient/therapist safety   OT goals addressed during session: ADL's and self-care      AM-PAC OT "6 Clicks" Daily Activity     Outcome Measure Help from another person eating meals?: Total Help from another person taking care of personal grooming?: Total Help from another person toileting, which includes using toliet, bedpan, or urinal?: Total Help from another person bathing (including washing, rinsing, drying)?: Total Help from another person to put on and taking off regular upper body clothing?: Total Help from another person to put on and taking off regular lower body clothing?: Total 6 Click Score: 6   End of Session Nurse Communication: Mobility status  Activity Tolerance: Other (comment)(limited by cognition) Patient left: in bed;with call bell/phone within reach;with family/visitor present  OT Visit Diagnosis: Other symptoms and signs involving cognitive function;Other abnormalities of gait and mobility (R26.89)                 Time: 6301-6010 OT Time Calculation (min): 30 min Charges:  OT General Charges $OT Visit: 1 Visit OT Evaluation $OT Eval Moderate Complexity: 1 Mod  Dalphine Handing, MSOT, OTR/L Acute Rehabilitation Services [facility] Office Number: [phone] Pager: [phone]  Dalphine Handing 05/16/2019, 4:46 PM

## 2019-05-16 NOTE — ED Notes (Signed)
Help get patient straighten up in bed back on the monitor patient is resting with call bell in reach and family at bedside

## 2019-05-16 NOTE — Progress Notes (Signed)
NAME:  Jennifer Owen, MRN:  [id number], DOB:  [birth year], LOS: 1 ADMISSION DATE:  05/15/2019   Brief History  86 yo female with dementia, hyperthyroidism, hot and cold thyroid nodules and hypertension who was admitted to IMTS on 05/15/19 for progressive AMS beyond her baseline over the past week. Also having diarrhea during this time. At baseline, pt is fully dependent for ADLs however is able to ambulate.  Subjective/Interm history   Hypokalemic this morning--2.6. no other overnight events.  Significant Hospital Events   5/5 hospital admission. Bili 4.4. Alk phos 1300. No stones or CBD dilation on RUQ [place].  UA possible UTI.   Objective   Blood pressure 123/61, pulse 72, temperature (!) 97.5 F (36.4 C), temperature source Rectal, resp. rate 17, weight 78 kg, SpO2 94 %.    No intake or output data in the 24 hours ending 05/16/19 0745 Filed Weights   05/15/19 2030  Weight: 78 kg    Examination: GENERAL: chronically ill appearing in no acute distress HEENT: mandibular prognathia. Mucous membranes dry appearing CARDIAC: RRR. Peripheries warm PULMONARY: breathing comfortably on room air. No adventitious lung sounds  ABDOMEN: soft. bs active NEURO: alert. Orientation difficult to evaluate due to speech impairment SKIN: skin tenting  Consults:  none  Significant Diagnostic Tests:  5/5 CXR>>mild pulm edema. After personal review, I am also noting a right hilar circumferential opacity. Appears calcified peripherally and does not appear spiculated. Has bronchograms visible within it   5/5 head CT>>no acute findings. Old right sided occipital infarct apparent  5/5 CT a/p>>large inspissated stool ball distending the rectum with rectal distention to 9.3 cm suggesting fecal impaction.  5/5 RUQ US>>gallbladder sludge. No features to suggest cholecytitis. Unable to visualize CBD due to bowel gas  Micro Data:  5/5 blood cultures>> 5/5 urine culture>>  Antimicrobials:  Rocephin  5/5>>5/6  Labs    CBC Latest Ref Rng & Units 05/16/2019 05/15/2019 03/01/2019  WBC 4.0 - 10.5 K/uL 13.8(H) 14.2(H) 4.9  Hemoglobin 12.0 - 15.0 g/dL 10.3(L) 12.3 11.6(L)  Hematocrit 36.0 - 46.0 % 31.6(L) 39.2 34.9(L)  Platelets 150 - 400 K/uL 309 342 211   BMP Latest Ref Rng & Units 05/16/2019 05/15/2019 03/01/2019  Glucose 70 - 99 mg/dL 86 86 94  BUN 8 - 23 mg/dL 24(H) 31(H) 19  Creatinine 0.44 - 1.00 mg/dL 1.34(H) 1.77(H) 0.87  BUN/Creat Ratio 12 - 28 - - -  Sodium 135 - 145 mmol/L 148(H) 144 142  Potassium 3.5 - 5.1 mmol/L 2.6(LL) 4.7 4.3  Chloride 98 - 111 mmol/L 110 105 109  CO2 22 - 32 mmol/L 24 20(L) 24  Calcium 8.9 - 10.3 mg/dL 9.5 9.9 10.2    Summary  88 yof with dementia at baseline who was admitted to IMTS on 05/15/19 for one week hx of progressively altered mental status and diarrhea who was found to have a fecal impaction and dehydration  Assessment & Plan:  Active Problems:   UTI (urinary tract infection)   Sepsis ([place])  Acute encephalopathy superimposed on baseline dementia. Broad ddx including stroke, infection, dehydration, thyroid dysfunction. No focal neuro deficits, head CT negative for acute findings on admission. Difficult to assess her mental status due to her speech impairments which I suspect are at least due, in part, to her prognathia. UA with white cells and rare bacteria--nitrite negative. Suspect this is contaminate however UC is pending.  Lactate initially elevated at 2.4 but resolved with fluids.  Dehydration. Likely due to poor po intake  over the past week. Supported by elevated bili and LFTs as well as resolution of lactic acidosis with 1L of IVF overnight. Na 148 this morning indicating 2L free water deficit.  Her PMH also also significant for recently diagnosed hyperthyroidism for which she was started on methimazole. TSH on admission around 4 however would be hesitant to adjust dose in the setting of acute illness. Fecal impaction also likely playing a role  in AMS. Plan Will d/c rocephin due to low suspicion for infection. Can consider restarting if urine culture or other diagnostics indicate infection Will restart IVF with D5LR @ 176m/hr. Will start with a ducolax suppository however suspect that we will need to do an enema. Unfortunately, unable to  Follow blood and urine cultures  Acute kidney injury. Baseline creatinine 0.9. creatinine 1.8 on admission. Some improvement overnight with IVF supporting pre-renal etiology. 4038mUOP since admission Acute metabolic acidosis due to lactic acidosis. Resolved with fluids overnight.  Hyperbilirubinemia. No CBD dilation or cholecystitis findings on RUQ [place]s are mildly elevated with alk phos being predominent (1300).  Suspect this is all attributable to dehyration in the setting of poor po intake over the past week per daughter. Not overly surprised to not see a significant change in labs overnight as she only received about 1L of fluids overnight.  Another consideration is methimazole induced hepatotoxicity.  Plan: IVF as above. Hold methimazole  Subacute macrocytic anemia. Appears to have been present since February. b12 normal at that time. Does not appear that an iron panel or folate has been checked so I think that would be reasonable to check those. Plan: f/u iron panel and folate. Continue to monitor hgb.  Hyperthyroidism. Initially diagnosed back in February at which time she was placed on methimazole. NM thyroid scan at that time revealed a small warm nodule at superior pole of right lobe. There was also a cold nodule on the inferior right lobe. On chart review, appears it was planned to have further evaluation with [place] evaluate and a repeat TSH/T4 however did not happen.  TSH on admission ~5 indicating possible hypothyroidism.  Plan: holding methimazole for now in light of this and possible hepatotoxicity. May be contributing to her encephalopathy. Will need close outpatient follow up for  this.  Hypertension. Antihypertensives held on admission. Remains normotensive. Plan: continue to hold home antihypertensives.  High risk malnutrition. Failed bedside swallow. SLP eval pending.  Will add d5 to her LR.   Best practice:  CODE STATUS: DNR/DNI Diet: NPO DVT for prophylaxis: lovenox Social considerations/Family communication: daughter updated at bedside Dispo: pending further medical management   RYMitzi HansenMD INIngalls ParkGY-1 PAGER #: [phone]/06/21  7:45 AM

## 2019-05-16 NOTE — Progress Notes (Signed)
   05/16/19 1500  Assess: MEWS Score  Level of Consciousness Responds to Voice  Assess: MEWS Score  MEWS Temp 0  MEWS Systolic 0  MEWS Pulse 0  MEWS RR 1  MEWS LOC 1  MEWS Score 2  MEWS Score Color Yellow  Assess: if the MEWS score is Yellow or Red  Were vital signs taken at a resting state? No  Focused Assessment Documented focused assessment  Early Detection of Sepsis Score *See Row Information* Low  MEWS guidelines implemented *See Row Information* No, altered LOC is baseline  Notify: Charge Nurse/RN  Name of Charge Nurse/RN Notified Aurea Graff, RN  Date Charge Nurse/RN Notified 05/16/19  Time Charge Nurse/RN Notified 1555   Patient arrived to unit via stretcher accompanied by RN from ED.   Patient's daughter accompanied them as well and was introduced to  Staff and to unit routine.  Call bell placed within reach and bed wheels locked and Bed placed in lowest position for safety.  Plan discussed with daughter.   Patient confused / disoriented and LOC is mildly affected at baseline.  Responds To voice, but unable to follow commands.  Patient is constipated and will be receiving suppository  In attempt to relieve this.  Family is very supportive and wish to remain with patient For safety and to decrease her anxiety.

## 2019-05-16 NOTE — ED Notes (Signed)
Date and time results received: 05/16/19 0620 (use smartphrase ".now" to insert current time)  Test: Potassium  Critical Value: 2.6  Name of Provider Notified: Dr. Gwyneth Revels [phone]  Orders Received? Or Actions Taken?:

## 2019-05-16 NOTE — Evaluation (Signed)
Physical Therapy Evaluation Patient Details Name: Jennifer Owen MRN: [id number] DOB: Dec 26, 1931 Today's Date: 05/16/2019   History of Present Illness  Pt is an 86 y/o female admitted secondary to sepsis from UTI. PMH includes dementia, HTN, polio and PE.   Clinical Impression  Pt admitted secondary to problem above with deficits below. Pt requiring max A +2 to roll for clean up X3 during session. Also requiring max A +2 to perform partial stand at edge of stretcher this session. Feel pt would benefit from SNF level care, however, family may decide to take pt home. Will continue to follow acutely to maximize functional mobility independence and safety.     Follow Up Recommendations SNF;Supervision/Assistance - 24 hour(max HH if family takes pt home)    Equipment Recommendations  Other (comment)(hoyer lift and hoyer lift pad)    Recommendations for Other Services       Precautions / Restrictions Precautions Precautions: Fall Restrictions Weight Bearing Restrictions: No      Mobility  Bed Mobility Overal bed mobility: Needs Assistance Bed Mobility: Rolling;Supine to Sit;Sit to Supine Rolling: Max assist;+2 for physical assistance   Supine to sit: Max assist;+2 for physical assistance Sit to supine: Max assist;+2 for physical assistance   General bed mobility comments: Max A +2 to roll from side to side for clean up. Required clean up 3X during session. Pt resistive to rolling.  Max A for trunk assist and LE assist to perform supine<>sit.   Transfers Overall transfer level: Needs assistance Equipment used: 2 person hand held assist Transfers: Sit to/from Stand Sit to Stand: Max assist;+2 physical assistance         General transfer comment: Pt performed partial stand with very flexed posture. Unable to come to full upright.   Ambulation/Gait                Stairs            Wheelchair Mobility    Modified Rankin (Stroke Patients Only)        Balance Overall balance assessment: Needs assistance Sitting-balance support: Feet supported;No upper extremity supported Sitting balance-Leahy Scale: Poor Sitting balance - Comments: min to min guard A for maintenance of sitting balance as pt would tend to try and lay down.                                      Pertinent Vitals/Pain Pain Assessment: Faces Faces Pain Scale: No hurt    Home Living Family/patient expects to be discharged to:: Private residence Living Arrangements: Children Available Help at Discharge: Family;Available 24 hours/day Type of Home: House Home Access: Stairs to enter   [employer] of Steps: 2 Home Layout: One level Home Equipment: Cane - single point;Shower seat;Wheelchair - manual      Prior Function Level of Independence: Needs assistance   Gait / Transfers Assistance Needed: Requires assist for transfers into [facility] and for navigation with steps.   ADL's / Homemaking Assistance Needed: Family assists with sponge baths.         Hand Dominance        Extremity/Trunk Assessment   Upper Extremity Assessment Upper Extremity Assessment: Defer to OT evaluation    Lower Extremity Assessment Lower Extremity Assessment: Generalized weakness    Cervical / Trunk Assessment Cervical / Trunk Assessment: Kyphotic  Communication   Communication: Expressive difficulties  Cognition Arousal/Alertness: Awake/alert Behavior During Therapy: Restless;WFL for  tasks assessed/performed Overall Cognitive Status: History of cognitive impairments - at baseline                                 General Comments: Dementia at baseline.       General Comments General comments (skin integrity, edema, etc.): Pt's daughter present.     Exercises     Assessment/Plan    PT Assessment Patient needs continued PT services  PT Problem List Decreased strength;Decreased balance;Decreased mobility;Decreased activity  tolerance;Decreased knowledge of use of DME;Decreased cognition;Decreased safety awareness;Decreased knowledge of precautions       PT Treatment Interventions DME instruction;Functional mobility training;Therapeutic activities;Therapeutic exercise;Balance training;Patient/family education;Cognitive remediation    PT Goals (Current goals can be found in the Care Plan section)  Acute Rehab PT Goals Patient Stated Goal: for pt to return home per daughter PT Goal Formulation: With patient Time For Goal Achievement: 05/30/19 Potential to Achieve Goals: Fair    Frequency Min 2X/week   Barriers to discharge        Co-evaluation               AM-PAC PT "6 Clicks" Mobility  Outcome Measure Help needed turning from your back to your side while in a flat bed without using bedrails?: Total Help needed moving from lying on your back to sitting on the side of a flat bed without using bedrails?: Total Help needed moving to and from a bed to a chair (including a wheelchair)?: Total Help needed standing up from a chair using your arms (e.g., wheelchair or bedside chair)?: Total Help needed to walk in hospital room?: Total Help needed climbing 3-5 steps with a railing? : Total 6 Click Score: 6    End of Session Equipment Utilized During Treatment: Gait belt Activity Tolerance: Patient tolerated treatment well Patient left: in bed;with call bell/phone within reach;with family/visitor present Nurse Communication: Mobility status;Other (comment)(pt needs purewick ) PT Visit Diagnosis: Unsteadiness on feet (R26.81);Muscle weakness (generalized) (M62.81);Difficulty in walking, not elsewhere classified (R26.2)    Time: 9233-0076 PT Time Calculation (min) (ACUTE ONLY): 30 min   Charges:   PT Evaluation $PT Eval Moderate Complexity: 1 Mod          Reuel Derby, PT, DPT  Acute Rehabilitation Services  Pager: [phone] Office: [phone]   Rudean Hitt 05/16/2019,  2:37 PM

## 2019-05-17 ENCOUNTER — Inpatient Hospital Stay (HOSPITAL_COMMUNITY): Payer: Medicare Other

## 2019-05-17 LAB — BASIC METABOLIC PANEL
Anion gap: 9 (ref 5–15)
Anion gap: 14 (ref 5–15)
BUN: 12 mg/dL (ref 8–23)
BUN: 8 mg/dL (ref 8–23)
CO2: 26 mmol/L (ref 22–32)
CO2: 21 mmol/L — ABNORMAL LOW (ref 22–32)
Calcium: 9.6 mg/dL (ref 8.9–10.3)
Calcium: 9.8 mg/dL (ref 8.9–10.3)
Chloride: 110 mmol/L (ref 98–111)
Chloride: 108 mmol/L (ref 98–111)
Creatinine, Ser: 0.8 mg/dL (ref 0.44–1.00)
Creatinine, Ser: 0.86 mg/dL (ref 0.44–1.00)
GFR calc Af Amer: >60 mL/min (ref >60)
GFR calc Af Amer: >60 mL/min (ref >60)
GFR calc non Af Amer: >60 mL/min (ref >60)
GFR calc non Af Amer: >60 mL/min (ref >60)
Glucose, Bld: 139 mg/dL — ABNORMAL HIGH (ref 70–99)
Glucose, Bld: 124 mg/dL — ABNORMAL HIGH (ref 70–99)
Potassium: 3 mmol/L — ABNORMAL LOW (ref 3.5–5.1)
Potassium: 3.7 mmol/L (ref 3.5–5.1)
Sodium: 145 mmol/L (ref 135–145)
Sodium: 143 mmol/L (ref 135–145)

## 2019-05-17 LAB — HEPATIC FUNCTION PANEL
ALT: 64 U/L — ABNORMAL HIGH (ref 0–44)
AST: 67 U/L — ABNORMAL HIGH (ref 15–41)
Albumin: 2.2 g/dL — ABNORMAL LOW (ref 3.5–5.0)
Alkaline Phosphatase: 1021 U/L — ABNORMAL HIGH (ref 38–126)
Bilirubin, Direct: 1.1 mg/dL — ABNORMAL HIGH (ref 0.0–0.2)
Indirect Bilirubin: 1.3 mg/dL — ABNORMAL HIGH (ref 0.3–0.9)
Total Bilirubin: 2.4 mg/dL — ABNORMAL HIGH (ref 0.3–1.2)
Total Protein: 6.4 g/dL — ABNORMAL LOW (ref 6.5–8.1)

## 2019-05-17 LAB — CBC
HCT: 33.4 % — ABNORMAL LOW (ref 36.0–46.0)
Hemoglobin: 10.8 g/dL — ABNORMAL LOW (ref 12.0–15.0)
MCH: 32 pg (ref 26.0–34.0)
MCHC: 32.3 g/dL (ref 30.0–36.0)
MCV: 98.8 fL (ref 80.0–100.0)
Platelets: 298 10*3/uL (ref 150–400)
RBC: 3.38 MIL/uL — ABNORMAL LOW (ref 3.87–5.11)
RDW: 16 % — ABNORMAL HIGH (ref 11.5–15.5)
WBC: 9.4 10*3/uL (ref 4.0–10.5)
nRBC: 0 % (ref 0.0–0.2)

## 2019-05-17 LAB — MAGNESIUM: Magnesium: 1.9 mg/dL (ref 1.7–2.4)

## 2019-05-17 LAB — T4, FREE: Free T4: 1.38 ng/dL — ABNORMAL HIGH (ref 0.61–1.12)

## 2019-05-17 MED ORDER — FLEET ENEMA 7-19 GM/118ML RE ENEM
1.0000 | ENEMA | Freq: Once | RECTAL | Status: AC
  Administered 2019-05-17: 1 via RECTAL
  Filled 2019-05-17: qty 1

## 2019-05-17 MED ORDER — VITAMIN D 25 MCG (1000 UNIT) PO TABS: 2000.0000 [IU] | ORAL_TABLET | Freq: Every day | ORAL | Status: DC

## 2019-05-17 MED ORDER — MAGNESIUM CITRATE PO SOLN
1.0000 | Freq: Once | ORAL | Status: AC
  Administered 2019-05-17: 1 via ORAL
  Filled 2019-05-17: qty 296

## 2019-05-17 MED ORDER — FOLIC ACID 1 MG PO TABS: 1.0000 mg | ORAL_TABLET | Freq: Every day | ORAL | Status: DC

## 2019-05-17 MED ORDER — ADULT MULTIVITAMIN W/MINERALS CH: 1.0000 | ORAL_TABLET | Freq: Every day | ORAL | Status: DC

## 2019-05-17 MED ORDER — POTASSIUM CHLORIDE 10 MEQ/100ML IV SOLN
10.0000 meq | INTRAVENOUS | Status: AC
  Administered 2019-05-17 (×6): 10 meq via INTRAVENOUS
  Filled 2019-05-17 (×5): qty 100

## 2019-05-17 MED ORDER — PRO-STAT SUGAR FREE PO LIQD
30.0000 mL | Freq: Three times a day (TID) | ORAL | Status: DC
  Administered 2019-05-18 – 2019-05-20 (×2): 30 mL via ORAL
  Filled 2019-05-17: qty 30

## 2019-05-17 NOTE — Evaluation (Signed)
Clinical/Bedside Swallow Evaluation Patient Details  Name: Jennifer Owen MRN: [id number] Date of Birth: 10/21/1931  Today's Date: 05/17/2019 Time: SLP Start Time (ACUTE ONLY): 62 SLP Stop Time (ACUTE ONLY): 1030 SLP Time Calculation (min) (ACUTE ONLY): 23 min  Past Medical History:  Past Medical History:  Diagnosis Date  . GERD (gastroesophageal reflux disease)    Papulous gastropathy EGD Oct. 2007  . Hyperlipemia   . Hypertension   . IBS (irritable bowel syndrome)   . Low back pain    intermittent radiculopathy  . Osteoarthritis   . Osteopenia    T score-1.1 R [place], - 0.4 L spine  . Polio 1942  . Pulmonary embolism (HCC)    2/2 knee replacement  . Urinary incontinence   . UTI (urinary tract infection) 05/16/2019   Past Surgical History:  Past Surgical History:  Procedure Laterality Date  . ABDOMINAL HYSTERECTOMY  1985  . JOINT REPLACEMENT     total knee - left 1995, right 2004  . OOPHORECTOMY  19816   HPI:  86 year old woman with PMH of dementia, hypothyroidism, HTN and GERD who was admitted with sepsis secondary to UTI.    Assessment / Plan / Recommendation Clinical Impression  Pt presents with an acute dysphagia related to MS changes.  Oral care provided prior to initiating evaluation.  Pt with prognathic lower jaw; symmetric face; no upper teeth; lowers present.  She did not follow commands, and it was difficult to assess oral cavity, although she did allow oral suctioning, which removed thick oral secretions.  Speech was generally unintelligible, with occasional single-word responses that could be understood. Pt's behaviors were consistent with cognitive-based dysphagias: there was oral holding of thin liquid and pureed boluses, requiring tactile/verbal cues to swallow.  She required repetition in order to eventually use a straw with success. Swallowing was occasionally followed by a delayed, weak cough, however, she may still have had thick secretions in pharynx  after being NPO for 24 hours.  Recommend starting a full liquid diet for now with 1:1 assist to eat; crush meds.  If she continues to cough with PO intake, please page weekend SLP at [phone].   SLP Visit Diagnosis: Dysphagia, oropharyngeal phase (R13.12)    Aspiration Risk       Diet Recommendation   full liquids  Medication Administration: Crushed with puree    Other  Recommendations Oral Care Recommendations: Oral care BID   Follow up Recommendations Other (comment)(tba)      Frequency and Duration min 2x/week  1 week       Prognosis        Swallow Study   General HPI: 86 year old woman with PMH of dementia, hypothyroidism, HTN and GERD who was admitted with sepsis secondary to UTI.  Type of Study: Bedside Swallow Evaluation Previous Swallow Assessment: no Diet Prior to this Study: NPO Temperature Spikes Noted: No Respiratory Status: Room air History of Recent Intubation: No Behavior/Cognition: Alert;Confused Oral Cavity Assessment: Other (comment)(difficult to assess) Oral Care Completed by SLP: Yes Oral Cavity - Dentition: Missing dentition Self-Feeding Abilities: Needs assist Patient Positioning: Upright in bed Baseline Vocal Quality: Normal Volitional Cough: Cognitively unable to elicit Volitional Swallow: Unable to elicit    Oral/Motor/Sensory Function Overall Oral Motor/Sensory Function: Other (comment)(prognathic mandible)   Ice Chips Ice chips: Not tested   Thin Liquid Thin Liquid: Impaired Presentation: Straw;Cup Oral Phase Functional Implications: Oral holding Pharyngeal  Phase Impairments: Cough - Delayed    Nectar Thick Nectar Thick Liquid:  Not tested   Honey Thick Honey Thick Liquid: Not tested   Puree Puree: Impaired Presentation: Spoon Oral Phase Functional Implications: Oral holding;Oral residue   Solid     Solid: Not tested      Blenda Mounts Laurice 05/17/2019,10:45 AM  Marchelle Folks L. Samson Frederic, MA CCC/SLP Acute Rehabilitation  Services Office number [phone] Pager [phone]

## 2019-05-17 NOTE — Progress Notes (Signed)
Initial Nutrition Assessment  DOCUMENTATION CODES:   Not applicable  INTERVENTION:   -30 ml Prostat TID, each supplement provides 100 kcals and 15 grams protein -MVI with minerals daily -Magic cup TID with meals, each supplement provides 290 kcal and 9 grams of protein -Hormel Shake TID with meals, each supplement provides 520 kcals and 22 grams protein -Feeding assistance with meals  NUTRITION DIAGNOSIS:   Inadequate oral intake related to dysphagia, lethargy/confusion as evidenced by meal completion < 25%.  GOAL:   Patient will meet greater than or equal to 90% of their needs  MONITOR:   PO intake, Supplement acceptance, Diet advancement, Labs, Weight trends, Skin, I & O's  REASON FOR ASSESSMENT:   Consult Assessment of nutrition requirement/status  ASSESSMENT:   Jennifer Owen is a 86 year old F with significant PMH of dementia, hyperthyroidism, hypertension, and GERD, who was brought in by her daughter for AMS and poor PO intake.  Pt admitted with AMS, sepsis, and UTI.   Reviewed I/O's: +1.6 L x 24 hours   UOP: 200 ml x 24 hours  Per chart review, PTA pt resides with daughter, who is finding it more difficult to care for her. Plan for SNF placement.   Pt sitting up in bed at time of visit. She turned her head towards this RD and repeated "hello" when RD greeted her. She was unable to answer most questions and speech was often garbled and difficult to understand. No family members in room at time of visit.   Case discussed with SLP, who evaluated pt just prior to visit. Pt held most foods and liquids during evaluation. Plan to advance to a full liquid diet for now. Noted pt also with a hyperextended lower jaw.   Reviewed wt hx; noted pt has experienced a 4.7% wt loss over the past month, which while not significant for time frame, is concerning given advanced age and dementia.   Medications reviewed and include dextrose 5% in lactated ringers solution @ 100 ml/hr and  potassium chloride IV.   Labs reviewed:K: 3.0 (on IV supplementation),  CBGS: 60 (inpatient orders for glycemic control are none).   NUTRITION - FOCUSED PHYSICAL EXAM:    Most Recent Value  Orbital Region  No depletion  Upper Arm Region  Mild depletion  Thoracic and Lumbar Region  No depletion  Buccal Region  No depletion  Temple Region  No depletion  Clavicle Bone Region  Mild depletion  Clavicle and Acromion Bone Region  Mild depletion  Scapular Bone Region  Mild depletion  Dorsal Hand  Mild depletion  Patellar Region  No depletion  Anterior Thigh Region  No depletion  Posterior Calf Region  No depletion  Edema (RD Assessment)  Mild  Hair  Reviewed  Eyes  Reviewed  Mouth  Reviewed  Skin  Reviewed  Nails  Reviewed       Diet Order:   Diet Order            Diet full liquid Room service appropriate? No; Fluid consistency: Thin  Diet effective now              EDUCATION NEEDS:   No education needs have been identified at this time  Skin:  Skin Assessment: Reviewed RN Assessment  Last BM:  05/17/19  Height:   Ht Readings from Last 1 Encounters:  05/16/19 5\' 6"  (1.676 m)    Weight:   Wt Readings from Last 1 Encounters:  05/17/19 74.6 kg    Ideal  Body Weight:  59.1 kg  BMI:  Body mass index is 26.55 kg/m.  Estimated Nutritional Needs:   Kcal:  1650-1850  Protein:  80-95 grams  Fluid:  > 1.6 L    Levada Schilling, RD, LDN, CDCES Registered Dietitian II Certified Diabetes Care and Education Specialist Please refer to [facility] for RD and/or RD on-call/weekend/after hours pager

## 2019-05-17 NOTE — Progress Notes (Signed)
NAME:  Jennifer Owen, MRN:  [id number], DOB:  [birth year], LOS: 2 ADMISSION DATE:  05/15/2019   Brief History  86 yo female with dementia, hyperthyroidism, hot and cold thyroid nodules and hypertension who was admitted to IMTS on 05/15/19 for progressive AMS beyond her baseline over the past week. Also having diarrhea during this time. At baseline, pt is fully dependent for ADLs however is able to ambulate.  Subjective/Interm history  No major overnight events. Daughter at bedside this morning. We discussed that her LFTs remain elevated despite rehydration. We also chatted about her hot and cold thyroid nodules and I encouraged follow up with endocrinology. I inquired about degree of investigation that the daughter would like [place] to pursue for pt's liver and thyroid issues. I put this in the context of her severe dementia and age. Daughter unsure about thyroid workup however would like [place] to look into her elevated bili/alk phos.   Significant Hospital Events   5/5> hospital admission. Bili 4.4. Alk phos 1300. No stones or CBD dilation on RUQ [place].  UA possible UTI.  5/6> hypokalemic at 2.6>10 runs of potassium given. Hypoglycemic>D5LR. PT recommending SNF 5/7>K 3.0 this morning>6 runs of K given  Objective   Blood pressure 131/72, pulse 77, temperature 97.7 F (36.5 C), temperature source Tympanic, resp. rate 17, height '5\' 6"'  (1.676 m), weight 75 kg, SpO2 97 %.     Intake/Output Summary (Last 24 hours) at 05/17/2019 0514 Last data filed at 05/16/2019 2000 Gross per 24 hour  Intake 964.01 ml  Output --  Net 964.01 ml   Filed Weights   05/15/19 2030 05/16/19 1500  Weight: 78 kg 75 kg    Examination: GENERAL: chronically ill appearing in no acute distress HEENT: mandibular prognathia. Mucous membranes moist CARDIAC: RRR. Peripheries warm PULMONARY: lungs clear ABDOMEN: soft. bs active NEURO: alert. nonverbal  Consults:  none  Significant Diagnostic Tests:  5/5 CXR>>mild pulm  edema. After personal review, I am also noting a right hilar circumferential opacity. Appears calcified peripherally and does not appear spiculated. Has bronchograms visible within it   5/5 head CT>>no acute findings. Old right sided occipital infarct apparent  5/5 CT a/p>>large inspissated stool ball distending the rectum with rectal distention to 9.3 cm suggesting fecal impaction.  5/5 RUQ US>>gallbladder sludge. No features to suggest cholecytitis. Unable to visualize CBD due to bowel gas  Micro Data:  5/5 blood cultures>>NGTD  Antimicrobials:  Rocephin 5/5>>5/6  Summary  47 yof with dementia at baseline who was admitted to IMTS on 05/15/19 for one week hx of progressively altered mental status and diarrhea who was found to have a fecal impaction and dehydration  Assessment & Plan:  Active Problems:   Constipation   Generalized weakness   Hypothyroidism   UTI (urinary tract infection)   Sepsis (HCC)   Transaminitis  Acute metabolic encephalopathy superimposed on baseline dementia. Daughter noting that pt is back to her baseline today.  Acute pre-renal kidney injury. Resolved with IVF Hypokalemia. Did not respond appropriately to the 10 runs of K yesterday--K 2.6>3.0. Mg corrected yesterday and normal today.  Plan D/C IVF now that she is on full liquid diet. Repleting K again today. Will recheck K this evening. If not responding appropriately, will obtain some urine studies.  Fecal Impaction. Noted on admission CT. Given ducolax suppository yesterday>1BM ovenright. Will give a fleets and mag citrate to try to get things moving  Hyperbilirubinemia with cholestatic pattern. Alk phos and bili still fairly elevated today.  Would expect to have seen more improvement if solely related to dehydration. Plan: MRCP to r/o malignant pathology. Will continue to hold methimazole in case this is drug induced hepatotoxicity  Subacute macrocytic anemia. hgb stable B12 normal in February Iron  panel borderline ACD Folate 4.6 Plan: start folate supplementation  Hyperthyroidism with hot and cold nodules. Initially diagnosed back in February at which time she was placed on methimazole.  TSH on admission ~5 indicating possible iatrogenic hypothyroidism.  Plan: continue holding methimazole. Will obtain fT4,T3 for further evaluation  Hypertension. Antihypertensives held on admission. Remains normotensive but trending up Plan: continue to hold home antihypertensives  High risk malnutrition. SLP evaluated--recommending full liquid diet at this time. Appreciate nutrition evaluation  QT prolongation. 540 on admission. Avoid QT prolonging agents.  Physical deconditioning. Evaluated by PT who is recommending SNF. Will consult TOC to discuss memory care placement.   Best practice:  CODE STATUS: DNR/DNI Diet: full liquids DVT for prophylaxis: lovenox Social considerations/Family communication: daughter updated at bedside Dispo: suspect will be stable for discharge in [place]. PT recommending SNF. Will need close followup with endocrinology outpatient regarding nodules and for hyperthyroid management.   [name], MD INTERNAL MEDICINE RESIDENT PGY-1 PAGER #: [phone] 05/17/19  5:14 AM  Labs    CBC Latest Ref Rng & Units 05/17/2019 05/16/2019 05/15/2019  WBC 4.0 - 10.5 K/uL 9.4 13.8(H) 14.2(H)  Hemoglobin 12.0 - 15.0 g/dL 10.8(L) 10.3(L) 12.3  Hematocrit 36.0 - 46.0 % 33.4(L) 31.6(L) 39.2  Platelets 150 - 400 K/uL 298 309 342   BMP Latest Ref Rng & Units 05/17/2019 05/16/2019 05/16/2019  Glucose 70 - 99 mg/dL 139(H) 73 86  BUN 8 - 23 mg/dL 12 20 24(H)  Creatinine 0.44 - 1.00 mg/dL 0.80 1.15(H) 1.34(H)  BUN/Creat Ratio 12 - 28 - - -  Sodium 135 - 145 mmol/L 145 148(H) 148(H)  Potassium 3.5 - 5.1 mmol/L 3.0(L) 3.1(L) 2.6(LL)  Chloride 98 - 111 mmol/L 110 109 110  CO2 22 - 32 mmol/L '26 24 24  ' Calcium 8.9 - 10.3 mg/dL 9.6 9.8 9.5

## 2019-05-17 NOTE — [place] FL2 (Signed)
[place] MEDICAID FL2 LEVEL OF CARE SCREENING TOOL     IDENTIFICATION  Patient Name: Jennifer Owen Birthdate: 07-25-1931 Sex: female Admission Date (Current Location): 05/15/2019  [facility] and [place] Number:  Producer, television/film/video and Address:  [facility] [place], [address], [place], [place] [address]      Provider Number: [id number]  Attending Physician Name and Address:  Inez Catalina, MD  Relative Name and Phone Number:       Current Level of Care: Hospital Recommended Level of Care: Skilled Nursing Facility Prior Approval Number:    Date Approved/Denied:   PASRR Number: [id number]  Discharge Plan: SNF    Current Diagnoses: Patient Active Problem List   Diagnosis Date Noted  . Transaminitis   . UTI (urinary tract infection) 05/15/2019  . Sepsis (HCC) 05/15/2019  . Hypothyroidism 03/11/2019  . Hyperthyroidism 03/02/2019  . Acute delirium 03/02/2019  . Acute encephalopathy 02/27/2019  . Vertigo 10/16/2018  . Chronic cough 01/16/2018  . Dementia (HCC) 03/08/2017  . Headache 10/01/2015  . Dark stools 06/22/2015  . History of CVA (cerebrovascular accident) 06/17/2014  . Healthcare maintenance 06/16/2014  . Frequent falls 04/28/2014  . Bradycardia 11/20/2013  . Hypercalcemia 06/07/2013  . Knee pain, chronic 01/14/2013  . Generalized weakness 09/22/2011  . NONEXUDATIVE SENILE MACULAR DEGENERATION RETINA 03/20/2008  . Constipation 11/27/2006  . HLD (hyperlipidemia) 12/15/2005  . PULMONARY EMBOLISM, HX OF 12/15/2005  . Essential hypertension 10/26/2005  . GERD 10/26/2005  . Osteoarthritis of multiple joints 10/26/2005  . Knee joint replacement by other means 10/26/2005    Orientation RESPIRATION BLADDER Height & Weight     Self  Normal Incontinent, External catheter Weight: 164 lb 7.4 oz (74.6 kg) Height:  5\' 6"  (167.6 cm)  BEHAVIORAL SYMPTOMS/MOOD NEUROLOGICAL BOWEL NUTRITION STATUS      Incontinent Diet(see discharge summary)   AMBULATORY STATUS COMMUNICATION OF NEEDS Skin   Extensive Assist Verbally Normal                       Personal Care Assistance Level of Assistance  Bathing, Feeding, Dressing Bathing Assistance: Maximum assistance Feeding assistance: Maximum assistance Dressing Assistance: Maximum assistance     Functional Limitations Info  Sight, Hearing, Speech Sight Info: Adequate Hearing Info: Adequate Speech Info: Adequate    SPECIAL CARE FACTORS FREQUENCY  PT (By licensed PT), OT (By licensed OT)     PT Frequency: 5x week OT Frequency: 5x week            Contractures Contractures Info: Not present    Additional Factors Info  Code Status, Allergies, Psychotropic Code Status Info: DNR Allergies Info: Tramadol Hcl Psychotropic Info: ramelteon (ROZEREM) tablet 8 mg daily at bedtime PO         Current Medications (05/17/2019):  This is the current hospital active medication list Current Facility-Administered Medications  Medication Dose Route Frequency Provider Last Rate Last Admin  . aspirin chewable tablet 81 mg  81 mg Oral Daily Masoudi, Elhamalsadat, MD      . atorvastatin (LIPITOR) tablet 40 mg  40 mg Oral Daily Masoudi, Elhamalsadat, MD      . bisacodyl (DULCOLAX) suppository 10 mg  10 mg Rectal Once Christian, Rylee, MD      . cholecalciferol (VITAMIN D3) tablet 2,000 Units  2,000 Units Oral Daily Christian, Rylee, MD      . enoxaparin (LOVENOX) injection 40 mg  40 mg Subcutaneous Q24H 03-10-2001, MD  40 mg at 05/17/19 0856  . feeding supplement (PRO-STAT SUGAR FREE 64) liquid 30 mL  30 mL Oral TID Sid Falcon, MD      . folic acid (FOLVITE) tablet 1 mg  1 mg Oral Daily Christian, Rylee, MD      . magnesium citrate solution 1 Bottle  1 Bottle Oral Once Christian, Rylee, MD      . multivitamin with minerals tablet 1 tablet  1 tablet Oral Daily Gilles Chiquito B, MD      . pantoprazole (PROTONIX) EC tablet 40 mg  40 mg Oral Daily Masoudi, Elhamalsadat, MD      .  polyethylene glycol (MIRALAX / GLYCOLAX) packet 17 g  17 g Oral Daily Masoudi, Elhamalsadat, MD      . ramelteon (ROZEREM) tablet 8 mg  8 mg Oral QHS Christian, Rylee, MD      . sodium phosphate (FLEET) 7-19 GM/118ML enema 1 enema  1 enema Rectal Once [name], MD         Discharge Medications: Please see discharge summary for a list of discharge medications.  Relevant Imaging Results:  Relevant Lab Results:   Additional Information [id number] [place], [place]

## 2019-05-17 NOTE — Progress Notes (Addendum)
Internal Medicine Clinic Attending  CCM services provided by the care management provider and their documentation were discussed with Dr. Chundi. We reviewed the pertinent findings, urgent action items addressed by the resident and non-urgent items to be addressed by the PCP.  I agree with the assessment, diagnosis, and plan of care documented in the CCM and resident's note.  Taysia Rivere C Makaia Rappa, DO 05/17/2019  

## 2019-05-18 ENCOUNTER — Ambulatory Visit: Payer: Self-pay

## 2019-05-18 DIAGNOSIS — K831 Obstruction of bile duct: Secondary | ICD-10-CM | POA: Diagnosis not present

## 2019-05-18 LAB — COMPREHENSIVE METABOLIC PANEL
ALT: 63 U/L — ABNORMAL HIGH (ref 0–44)
AST: 66 U/L — ABNORMAL HIGH (ref 15–41)
Albumin: 2.1 g/dL — ABNORMAL LOW (ref 3.5–5.0)
Alkaline Phosphatase: 1020 U/L — ABNORMAL HIGH (ref 38–126)
Anion gap: 13 (ref 5–15)
BUN: 7 mg/dL — ABNORMAL LOW (ref 8–23)
CO2: 23 mmol/L (ref 22–32)
Calcium: 9.9 mg/dL (ref 8.9–10.3)
Chloride: 108 mmol/L (ref 98–111)
Creatinine, Ser: 0.87 mg/dL (ref 0.44–1.00)
GFR calc Af Amer: >60 mL/min (ref >60)
GFR calc non Af Amer: >60 mL/min (ref >60)
Glucose, Bld: 108 mg/dL — ABNORMAL HIGH (ref 70–99)
Potassium: 3.5 mmol/L (ref 3.5–5.1)
Sodium: 144 mmol/L (ref 135–145)
Total Bilirubin: 2 mg/dL — ABNORMAL HIGH (ref 0.3–1.2)
Total Protein: 6.5 g/dL (ref 6.5–8.1)

## 2019-05-18 LAB — T3, FREE: T3, Free: 1.8 pg/mL — ABNORMAL LOW (ref 2.0–4.4)

## 2019-05-18 LAB — GLUCOSE, CAPILLARY: Glucose-Capillary: 104 mg/dL — ABNORMAL HIGH (ref 70–99)

## 2019-05-18 MED ORDER — ACETAMINOPHEN 325 MG PO TABS: 650.0000 mg | ORAL_TABLET | Freq: Four times a day (QID) | ORAL | Status: DC | PRN

## 2019-05-18 NOTE — TOC Progression Note (Signed)
Transition of Care [facility]) - Progression Note    Patient Details  Name: Jennifer Owen MRN: [id number] Date of Birth: 1931-04-18  Transition of Care [facility]) CM/SW Contact  Gildardo Griffes, [place] Phone Number: 05/18/2019, 11:50 AM  Clinical Narrative:      CSW attempted to complete initial assessment for SNF with patient's daughter as patient disoriented x4.   CSW called [facility]renda [facility], no answer lvm. CSW called  Patient's daughter Drinda Butts, who answered and reported [facility] is the decision maker and that she was currently at the hospital.   CSW visited patient's room, Drenda Freeze not present and CSW spoke with unit secretaries who report no visitors today.   Will await call back from Drenda Freeze to discuss SNF as dc plan and bed offers.       Expected Discharge Plan and Services                                                 Social Determinants of Health (SDOH) Interventions    Readmission Risk Interventions No flowsheet data found.

## 2019-05-18 NOTE — Progress Notes (Signed)
NAME:  PICCOLA ARICO, MRN:  [id number], DOB:  [birth year], LOS: 3 ADMISSION DATE:  05/15/2019   Brief History  86 yo female with dementia, hyperthyroidism, hot and cold thyroid nodules and hypertension who was admitted to IMTS on 05/15/19 for progressive AMS beyond her baseline over the past week. Also having diarrhea during this time. At baseline, pt is fully dependent for ADLs however is able to ambulate.  Subjective/Interm history  Daughter at bedside. Discussed MRCP results and that they were likely degraded by motion artifact.  Discussed discharging plans for pt. Daughter wishing pt to return home--not interested in SNF at this time.   Significant Hospital Events   5/5> hospital admission. Bili 4.4. Alk phos 1300. No stones or CBD dilation on RUQ [place].  UA possible UTI.  5/6> hypokalemic at 2.6>10 runs of potassium given. Hypoglycemic>D5LR. PT recommending SNF 5/7>K 3.0 this morning>6 runs of K given. MRCP for elevated LFTs normal. Fleets and mag citrate>2lg bowel movements 5/8>  Objective   Blood pressure 121/70, pulse 79, temperature 97.6 F (36.4 C), temperature source Axillary, resp. rate 17, height '5\' 6"'  (1.676 m), weight 74.6 kg, SpO2 99 %.     Intake/Output Summary (Last 24 hours) at 05/18/2019 0529 Last data filed at 05/18/2019 0502 Gross per 24 hour  Intake 870.96 ml  Output 700 ml  Net 170.96 ml   Filed Weights   05/15/19 2030 05/16/19 1500 05/17/19 0609  Weight: 78 kg 75 kg 74.6 kg    Examination: GENERAL: chronically ill appearing in no acute distress HEENT: mandibular prognathia. Mucous membranes moist CARDIAC: RRR. Peripheries warm PULMONARY: lungs clear ABDOMEN: soft. bs active NEURO: alert. nonverbal  Consults:  none  Significant Diagnostic Tests:  5/5 CXR>>mild pulm edema. After personal review, I am also noting a right hilar circumferential opacity. Appears calcified peripherally and does not appear spiculated. Has bronchograms visible within it    5/5 head CT>>no acute findings. Old right sided occipital infarct apparent  5/5 CT a/p>>large inspissated stool ball distending the rectum with rectal distention to 9.3 cm suggesting fecal impaction.  5/5 RUQ US>>gallbladder sludge. No features to suggest cholecytitis. Unable to visualize CBD due to bowel gas 5/7 MRCP>>severely degraded due to motion artifact but no significant findings appreciated  Micro Data:  5/5 blood cultures>>NGTD  Antimicrobials:  Rocephin 5/5>>5/6  Summary  49 yof with dementia at baseline who was admitted to IMTS on 05/15/19 for one week hx of progressively altered mental status and diarrhea who was found to have a fecal impaction and dehydration  Resolved/stable hospital problems  Acute metabolic encephalopathy-resolved 5/7  Pre-renal AKI-resolved 5/7 Macrocytic anemia  Assessment & Plan:  Active Problems:   Constipation   Generalized weakness   Hypothyroidism   UTI (urinary tract infection)   Sepsis (HCC)   Transaminitis   Hypokalemia. Responded more appropriately to yesterday's K runs. Stable at 3.5 this morning. Plan: Replete as needed.  Fecal Impaction. Noted on admission CT. Bedside RNs indicate 2 large soft BM yesterday and this morning. Will hold off on further bowel management today  Cholestasis. Alk phos remains elevated but stable from yesterday. Bili coming down. Initially attributed to dehydration however remained elevated after volume repletion. MCRP obtained yesterday to r/o malignancy>was severely degraded by motion artifact however no significant findings were present  Plan: Continue to hold methimazole. Will reach out to GI to review case with them. Can consider obtaining an AMA to evaluate for autoimmune process.  Hyperthyroidism with hot and cold nodules.  TSH on admission ~5. fT4 mildly elevated at 1.38. fT3 mildly low at 1.8. Plan: continue holding methimazole.   Hypertension. Blood pressures stable. continue to hold home  antihypertensives  High risk malnutrition due to cognitive based dysphasia. SLP evaluated--recommending full liquid diet at this time.   QT prolongation. 540 on admission. Avoid QT prolonging agents.  Decline in functional status. PT recommending SNF/memory care. TOC consulted and FL2 sent 5/7 however unsure if they spoke with daughter regarding placement. Daughter is wishing for patient to return home with her.   Best practice:  CODE STATUS: DNR/DNI Diet: full liquids DVT for prophylaxis: lovenox Social considerations/Family communication: daughter updated at bedside Dispo: likely stable for discharge pending discussion with GI.  Will need close followup with endocrinology outpatient regarding nodules and for hyperthyroid management.   [name], MD INTERNAL MEDICINE RESIDENT PGY-1 PAGER #: [phone] 05/18/19  5:29 AM  Labs    CBC Latest Ref Rng & Units 05/17/2019 05/16/2019 05/15/2019  WBC 4.0 - 10.5 K/uL 9.4 13.8(H) 14.2(H)  Hemoglobin 12.0 - 15.0 g/dL 10.8(L) 10.3(L) 12.3  Hematocrit 36.0 - 46.0 % 33.4(L) 31.6(L) 39.2  Platelets 150 - 400 K/uL 298 309 342   BMP Latest Ref Rng & Units 05/17/2019 05/17/2019 05/16/2019  Glucose 70 - 99 mg/dL 124(H) 139(H) 73  BUN 8 - 23 mg/dL '8 12 20  ' Creatinine 0.44 - 1.00 mg/dL 0.86 0.80 1.15(H)  BUN/Creat Ratio 12 - 28 - - -  Sodium 135 - 145 mmol/L 143 145 148(H)  Potassium 3.5 - 5.1 mmol/L 3.7 3.0(L) 3.1(L)  Chloride 98 - 111 mmol/L 108 110 109  CO2 22 - 32 mmol/L 21(L) 26 24  Calcium 8.9 - 10.3 mg/dL 9.8 9.6 9.8

## 2019-05-18 NOTE — Consult Note (Addendum)
Referring Provider: No ref. provider found Primary Care Physician:  Delice Bison, DO  Reason for Consultation:  Elevated alk phos   IMPRESSION:  Abnormal liver enzymes in cholestatic pattern with normal biliary tree on ultrasound, CT, or limited MRI    - acute elevation since 02/2019    - marked elevation in alk phos, GGTP    - mild elevation in transaminases    - indirect bilirubin > direct bilirubin    - no associated symptoms    - no rash, fever, or eosinophilia Fecal impaction on CT  Etiology of abnormal liver enzymes in a predominantly cholestatic pattern is unclear despite ultrasound, CT, and MRI/MRCP. Cholestatic abnormalities can be seen due to methimazole, typically occurring within 2-12 weeks of starting therapy. The cholestatic hepatitis caused by methimazole can be prolonged, but fatalities are rare and labs usually normalize within 2 to 8 weeks of stopping therapy.  Cholestatic liver enzymes are also seen with hyperthyroidism.   Small duct biliary disease must also be considered. Interestingly, her bilirubin is only mildly elevated and is more indirect than direct. For this reason, non-hepatic sources of alk phos must also be considered.   Will proceed with serologic evaluation to confirm source of alk phos elevation and screen for small duct biliary disease.  In the meantime, alternatives to methimazole should be considered.    PLAN: Alkaline phosphatase isoenzymes 5' nucleotidase AMA, IgM ANA, IgG, IgG subtype 4 Continue to hold methimazole and consider alternative treatments  The inpatient GI team will check back with the patient and her daughter when these results are available.     HPI: Jennifer Owen is a 86 y.o. female seen in consultation at the request of Dr. Darrick Meigs for further evaluation of an abnormal alkaline phosphatase level.  The history is obtained through the patient's daughter who is present at the bedside and review of her electronic  health record. The patient is unable to contribute due to her dementia.   86 year old woman with dementia, hypothyroidism, HTN and GERD who was admitted 05/15/19 with altered mental status, hallucinations, decreased PO intake and occasional urinary urgency.  She was recently started on methimazole for hyperthyroidism secondary to an overactive thyroid nodule. Found to have AKI, UTI, and abnormal liver enzymes  Admission labs revealed alk phos 1270, TB 4.4, AST 103, ALT 79 Some improvement in labs while admitted, but, today her alk phos is 1020, TB 2.0, AST 66, ALT 63. Liver enzymes were normal in February 2021 with alk phos 54, TB 0.8, AST 17, ALT 18. Direct bilirubin 1.1, indirect bilirubin 1.3 GGTP 859. There is no globulin gap.  Platelets are normal. Macrocytic anemia.  TSH 05/15/19 4.980  RUQ ultrasound showed gallbladder sludge but normal ducts. CT abdomen/pelvis without contrast showed no biliary abnormalities.  Large rectal stool ball present.  MRI/MRCP was limited due to motion artifact but showed no gross abnormalities in the biliary tree.   No prior blood donation.  No prior blood transfusion.  No history of jaundice or scleral icterus.  No history of use or experimentation with IV or intranasal street drugs.  No history of autoimmune disease.  No family history of liver disease.  She worked at [employer] for over 30 years.   No noted complaints of pruritus, acholic stools, fever, night sweats, scleral icterus. The daughter notes that she has not been eating like she normally does and has not been able to perform her own ADLs over the last 2 weeks.  Previously seen by Dr. Olevia Perches for screening colonoscopy 08/27/09 that revealed sigmoid diverticulosis.    Past Medical History:  Diagnosis Date   GERD (gastroesophageal reflux disease)    Papulous gastropathy EGD Oct. 2007   Hyperlipemia    Hypertension    IBS (irritable bowel syndrome)    Low back pain    intermittent  radiculopathy   Osteoarthritis    Osteopenia    T score-1.1 R [place], - 0.4 L spine   Polio 1942   Pulmonary embolism (HCC)    2/2 knee replacement   Urinary incontinence    UTI (urinary tract infection) 05/16/2019    Past Surgical History:  Procedure Laterality Date   ABDOMINAL HYSTERECTOMY  1985   JOINT REPLACEMENT     total knee - left 1995, right 2004   OOPHORECTOMY  1985    Current Facility-Administered Medications  Medication Dose Route Frequency Provider Last Rate Last Admin   acetaminophen (TYLENOL) tablet 650 mg  650 mg Oral Q6H PRN Christian, Rylee, MD       aspirin chewable tablet 81 mg  81 mg Oral Daily Masoudi, Elhamalsadat, MD       atorvastatin (LIPITOR) tablet 40 mg  40 mg Oral Daily Masoudi, Elhamalsadat, MD       bisacodyl (DULCOLAX) suppository 10 mg  10 mg Rectal Once Christian, Rylee, MD       cholecalciferol (VITAMIN D3) tablet 2,000 Units  2,000 Units Oral Daily Christian, Rylee, MD       enoxaparin (LOVENOX) injection 40 mg  40 mg Subcutaneous Q24H Gilles Chiquito B, MD   40 mg at 05/18/19 8101   feeding supplement (PRO-STAT SUGAR FREE 64) liquid 30 mL  30 mL Oral TID Sid Falcon, MD   30 mL at 75/10/25 8527   folic acid (FOLVITE) tablet 1 mg  1 mg Oral Daily Christian, Rylee, MD       multivitamin with minerals tablet 1 tablet  1 tablet Oral Daily Sid Falcon, MD       pantoprazole (PROTONIX) EC tablet 40 mg  40 mg Oral Daily Masoudi, Elhamalsadat, MD       polyethylene glycol (MIRALAX / GLYCOLAX) packet 17 g  17 g Oral Daily Masoudi, Elhamalsadat, MD   17 g at 05/18/19 0923   ramelteon (ROZEREM) tablet 8 mg  8 mg Oral QHS Christian, Rylee, MD        Allergies as of 05/15/2019 - Review Complete 05/15/2019  Allergen Reaction Noted   Tramadol hcl Nausea Only and Other (See Comments)     Family History  Problem Relation Age of Onset   Cancer Daughter        colon caner, <60   Cancer Mother        lung   Alzheimer's  disease Father    Heart disease Son     Social History   Socioeconomic History   Marital status: Legally Separated    Spouse name: Not on file   Number of children: 7   Years of education: Not on file   Highest education level: Not on file  Occupational History   Occupation: Print production planner: RETIRED    Comment: 4-5 yr olds   Occupation: Surveyor, minerals   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 There are 2 steps with handrail on one side up to the entrance the patient uses.       Patient's method of transportation is personal car, driven by daughter.      The highest level of education was high school diploma.      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      Identified important Relationships are Daughter and 18 grandchildren.       Pets : None       Interests / Fun: work in garden, Architectural technologist, going to church daily for activities, read bible.       Current Stressors: None       Religious / Personal Beliefs: Holliday Ducatte, RN, BSN       Social Determinants of Health   Financial Resource Strain:    Difficulty of Paying Living Expenses:   Food Insecurity: No Food Insecurity   Worried About Charity fundraiser in the Last Year: Never true   Ran Out of Food in the Last Year: Never true  Transportation Needs: No Transportation Needs   Lack of  Transportation (Medical): No   Lack of Transportation (Non-Medical): No  Physical Activity:    Days of Exercise per Week:    Minutes of Exercise per Session:   Stress:    Feeling of Stress :   Social Connections:    Frequency of Communication with Friends and Family:    Frequency of Social Gatherings with Friends and Family:    Attends Religious Services:    Active Member of Clubs or Organizations:    Attends Music therapist:    Marital Status:   Intimate Partner Violence:    Fear of Current or Ex-Partner:    Emotionally Abused:    Physically Abused:    Sexually Abused:     Review of Systems: 12 system ROS is negative except as noted above.   Physical Exam: General:   Not interacting with me, does not open eyes to voice or cold hands, NAD Head:  Normocephalic and atraumatic. Eyes:  Sclera clear, no icterus.   Conjunctiva pink.  No xanthelasma or xanthelasma. Nose:  No deformity, discharge,  or lesions. Mouth:  No deformity or lesions.   Neck:  Supple; no masses or thyromegaly. Lungs:  Clear throughout to auscultation.   No wheezes. Heart:  Regular rate and rhythm; no murmurs. Abdomen:  Soft, nontender, nondistended, normal bowel sounds, no rebound or guarding. No hepatosplenomegaly.  No fluid wave.  Msk:  Symmetrical. No boney deformities LAD: No inguinal or umbilical LAD Extremities:  No clubbing or edema. Neurologic: Nonverbal. Not cooperating with testing. Moves all 4 extremities.  Skin: No spider angioma or palmar erythema. No rash or bruise.    Lab Results: Recent Labs    05/16/19 0436 05/17/19 0221  WBC 13.8* 9.4  HGB 10.3* 10.8*  HCT 31.6* 33.4*  PLT 309 298   BMET Recent Labs  05/17/19 0221 05/17/19 1532 05/18/19 0749  NA 145 143 144  K 3.0* 3.7 3.5  CL 110 108 108  CO2 26 21* 23  GLUCOSE 139* 124* 108*  BUN 12 8 7*  CREATININE 0.80 0.86 0.87  CALCIUM 9.6 9.8 9.9   LFT Recent Labs    05/17/19 0221  05/17/19 0221 05/18/19 0749  PROT 6.4*   < > 6.5  ALBUMIN 2.2*   < > 2.1*  AST 67*   < > 66*  ALT 64*   < > 63*  ALKPHOS 1,021*   < > 1,020*  BILITOT 2.4*   < > 2.0*  BILIDIR 1.1*  --   --   IBILI 1.3*  --   --    < > = values in this interval not displayed.   PT/INR Recent Labs    05/16/19 0436  LABPROT 14.0  INR 1.1   Hepatitis Panel No results for input(s): HEPBSAG, HCVAB, HEPAIGM, HEPBIGM in the last 72 hours.    Studies/Results: MR ABDOMEN MRCP WO CONTRAST  Result Date: 05/17/2019 CLINICAL DATA:  Painless jaundice. EXAM: MRI ABDOMEN WITHOUT CONTRAST  (INCLUDING MRCP) TECHNIQUE: Multiplanar multisequence MR imaging of the abdomen was performed. Heavily T2-weighted images of the biliary and pancreatic ducts were obtained, and three-dimensional MRCP images were rendered by post processing. COMPARISON:  CT abdomen 05/15/2019 and abdominal ultrasound 05/15/2019 FINDINGS: Despite efforts by the technologist and patient, motion artifact is present on today's exam and could not be eliminated. This reduces exam sensitivity and specificity. This is a common result when MRCP is attempted in the inpatient setting where patients are less likely to be able to breath hold and cooperate in controlling motion. Due to patient cooperation issues, we were only able to obtain 5 diagnostics series, and this are severely adversely affected by motion artifact. Lower chest: Atelectasis or mild pneumonia in the right lower lobe for example on image 6/6. Hepatobiliary: No appreciable biliary dilatation. No obvious filling defect in the gallbladder or well-defined mass in the liver on the T2 weighted images. Postcontrast imaging was not employed. On image 11/13 there is a suggestion of a 7 mm faintly T2 hyperintense lesion in the lateral segment left hepatic lobe. This lesion is nonspecific but has been present since 11/31/2008 hence is likely a cyst. Pancreas:  Grossly unremarkable. Spleen:  Unremarkable  Adrenals/Urinary Tract:  Unremarkable Stomach/Bowel: Descending colon diverticulosis. Vascular/Lymphatic:  Unremarkable Other:  No supplemental non-categorized findings. Musculoskeletal: Levoconvex lumbar scoliosis with lumbar spondylosis and degenerative disc disease. IMPRESSION: 1. Severely degraded exam due to motion artifact. Only a fraction of the normal MRI abdomen could be obtained due to patient cooperation issues. 2. No biliary dilatation or filling defect in the gallbladder or common biliary tree. 3. Atelectasis or mild pneumonia in the right lower lobe. 4. Levoconvex lumbar scoliosis with lumbar spondylosis and degenerative disc disease. 5. Descending colon diverticulosis. 6. Despite efforts by the technologist and patient, motion artifact is present on today's exam and could not be eliminated. This reduces exam sensitivity and specificity. Electronically Signed   By: Van Clines M.D.   On: 05/17/2019 17:36      Jennifer Mucha L. Tarri Glenn, MD, MPH 05/18/2019, 3:39 PM

## 2019-05-19 DIAGNOSIS — R748 Abnormal levels of other serum enzymes: Secondary | ICD-10-CM | POA: Diagnosis not present

## 2019-05-19 DIAGNOSIS — E440 Moderate protein-calorie malnutrition: Secondary | ICD-10-CM | POA: Diagnosis present

## 2019-05-19 DIAGNOSIS — F0390 Unspecified dementia without behavioral disturbance: Secondary | ICD-10-CM

## 2019-05-19 LAB — COMPREHENSIVE METABOLIC PANEL
ALT: 60 U/L — ABNORMAL HIGH (ref 0–44)
AST: 69 U/L — ABNORMAL HIGH (ref 15–41)
Albumin: 2.1 g/dL — ABNORMAL LOW (ref 3.5–5.0)
Alkaline Phosphatase: 966 U/L — ABNORMAL HIGH (ref 38–126)
Anion gap: 9 (ref 5–15)
BUN: 8 mg/dL (ref 8–23)
CO2: 25 mmol/L (ref 22–32)
Calcium: 9.5 mg/dL (ref 8.9–10.3)
Chloride: 111 mmol/L (ref 98–111)
Creatinine, Ser: 0.77 mg/dL (ref 0.44–1.00)
GFR calc Af Amer: >60 mL/min (ref >60)
GFR calc non Af Amer: >60 mL/min (ref >60)
Glucose, Bld: 84 mg/dL (ref 70–99)
Potassium: 3.2 mmol/L — ABNORMAL LOW (ref 3.5–5.1)
Sodium: 145 mmol/L (ref 135–145)
Total Bilirubin: 1.9 mg/dL — ABNORMAL HIGH (ref 0.3–1.2)
Total Protein: 6.4 g/dL — ABNORMAL LOW (ref 6.5–8.1)

## 2019-05-19 LAB — CALCITONIN: Calcitonin: <2 pg/mL (ref 0.0–5.0)

## 2019-05-19 LAB — GAMMA GT: GGT: 617 U/L — ABNORMAL HIGH (ref 7–50)

## 2019-05-19 LAB — SARS CORONAVIRUS 2 (TAT 6-24 HRS): SARS Coronavirus 2: NEGATIVE

## 2019-05-19 NOTE — Progress Notes (Signed)
Speech Language Pathology Treatment: Dysphagia  Patient Details Name: Jennifer Owen MRN: [id number] DOB: [birth year] Today's Date: 05/19/2019 Time: 3557-3220 SLP Time Calculation (min) (ACUTE ONLY): 31 min  Assessment / Plan / Recommendation Clinical Impression  Patient seen per MD request for f/u dysphagia treatment with focus on po tolerance. Patient easily aroused and repositioned by SLP to maintain appropriate level of alertness and maximize safety with po intake. Daughter present and supportive, reporting that patient is holding all pos in oral cavity. Initial bolus provided of thin liquid via straw, with patient requiring max initial tactile cue to lips for awareness, however then consuming approximately 2-3 ounces of thin liquid via consecutive straw sips with mild delay in initiation of pharyngeal swallow (seemingly) but without overt s/s of aspiration. Patient however would then no open oral cavity fully for acceptance of pureed solids despite max cueing, highly distracted internally. Minimal thin liquids again consumed at end of session, again via straw, without overt indication of aspiration. Education complete with daughter regarding performance and prognosis. Dysphagia is cognitive in nature at this time, with poor mental state exacerbated by acute UTI/sepsis. Hopeful that patient's mentation, and in turn swallow and po intake will improve with overall improvements in medical condition however cannot r/o also a decline/natural trajectory of dementia which was also discussed with daughter. Current po diet remains most appropriate at this time to maximize po intake. Encouraged daughter to offer highest calorie liquid pos first during meal. Daughter looking for SNF placement for patient, SLP in agreement that this is the best option given generally poor pos intake with possible need for alternative means or comfort measures if patient does not improve.    HPI HPI: 86 year old woman with PMH  of dementia, hypothyroidism, HTN and GERD who was admitted with sepsis secondary to UTI.       SLP Plan  Continue with current plan of care       Recommendations  Diet recommendations: Thin liquid(full liquid) Liquids provided via: Straw Medication Administration: Crushed with puree Supervision: Staff to assist with self feeding;Full supervision/cueing for compensatory strategies Compensations: Slow rate;Small sips/bites;Other (Comment)(use tactile cueing to lips to facilitate awareness) Postural Changes and/or Swallow Maneuvers: Seated upright 90 degrees                Oral Care Recommendations: Oral care BID SLP Visit Diagnosis: Dysphagia, oropharyngeal phase (R13.12) Plan: Continue with current plan of care       GO             Thorin Starner MA, CCC-SLP     Jennifer Owen 05/19/2019, 2:12 PM

## 2019-05-19 NOTE — TOC Progression Note (Addendum)
Transition of Care [facility]) - Progression Note    Patient Details  Name: Jennifer Owen MRN: [id number] Date of Birth: 1931/11/09  Transition of Care [facility]) CM/SW Contact  Terrial Rhodes, LCSWA Phone Number: 05/19/2019, 2:39 PM  Clinical Narrative:     CSW spoke with [name] who chose SNF placement at [facility]. CSW called [employer] to let them know of SNF choice. Insurance authorization still under review. CSW spoke with Velna Hatchet from [facility] who will verify that patient was accepted tomorrow morning.   Patient chose bed at [facility]. English as a second language teacher pending. Velna Hatchet from [facility] will call CSW tomorrow morning to verify patients bed offer is good. CSW put in an order for another Covid with physician.   Expected Discharge Plan: Skilled Nursing Facility Barriers to Discharge: Continued Medical Work up  Expected Discharge Plan and Services Expected Discharge Plan: Skilled Nursing Facility       Living arrangements for the past 2 months: Single Family Home                                       Social Determinants of Health (SDOH) Interventions    Readmission Risk Interventions No flowsheet data found.

## 2019-05-19 NOTE — TOC Initial Note (Addendum)
Transition of Care [facility]) - Initial/Assessment Note    Patient Details  Name: Jennifer Owen MRN: [id number] Date of Birth: 1931-05-25  Transition of Care [facility]) CM/SW Contact:    Terrial Rhodes, LCSWA Phone Number: 05/19/2019, 9:37 AM  Clinical Narrative:                  CSW spoke with [name] and offered SNF choices for patient. Patients daughter will call CSW back tomorrow with SNF choice. She wants to discuss with family and not sure if she will be able to make a decision today with it being a holiday. CSW let her know to follow up with Social worker tomorrow with SNF choice. Insurance authorization has been started reference number is [id number].  SNF choice pending. Insurance authorization pending.    Expected Discharge Plan: Skilled Nursing Facility Barriers to Discharge: Continued Medical Work up   Patient Goals and CMS Choice Patient states their goals for this hospitalization and ongoing recovery are:: to go to skilled nursing facility CMS Medicare.gov Compare Post Acute Care list provided to:: Patient Represenative (must comment)(Fran daughter) Choice offered to / list presented to : Adult Children(Fran)  Expected Discharge Plan and Services Expected Discharge Plan: Skilled Nursing Facility       Living arrangements for the past 2 months: Single Family Home                                      Prior Living Arrangements/Services Living arrangements for the past 2 months: Single Family Home Lives with:: Adult Children Patient language and need for interpreter reviewed:: Yes Do you feel safe going back to the place where you live?: No   SNF  Need for Family Participation in Patient Care: Yes (Comment) Care giver support system in place?: Yes (comment)   Criminal Activity/Legal Involvement Pertinent to Current Situation/Hospitalization: No - Comment as needed  Activities of Daily Living Home Assistive Devices/Equipment: Wheelchair ADL  Screening (condition at time of admission) Patient's cognitive ability adequate to safely complete daily activities?: No Is the patient deaf or have difficulty hearing?: Yes Does the patient have difficulty seeing, even when wearing glasses/contacts?: No Does the patient have difficulty concentrating, remembering, or making decisions?: Yes Patient able to express need for assistance with ADLs?: Yes Does the patient have difficulty dressing or bathing?: Yes Independently performs ADLs?: No Communication: Independent Dressing (OT): Needs assistance Is this a change from baseline?: Pre-admission baseline Grooming: Needs assistance Is this a change from baseline?: Pre-admission baseline Feeding: Needs assistance Is this a change from baseline?: Pre-admission baseline Bathing: Dependent Is this a change from baseline?: Pre-admission baseline Toileting: Needs assistance Is this a change from baseline?: Pre-admission baseline In/Out Bed: Dependent Is this a change from baseline?: Pre-admission baseline Walks in Home: Dependent Is this a change from baseline?: Pre-admission baseline Does the patient have difficulty walking or climbing stairs?: Yes Weakness of Legs: Both Weakness of Arms/Hands: None  Permission Sought/Granted Permission sought to share information with : Case Manager, Magazine features editor, Family Supports Permission granted to share information with : Yes, Verbal Permission Granted              Emotional Assessment       Orientation: : (Disoriented times 4) Alcohol / Substance Use: Not Applicable Psych Involvement: No (comment)  Admission diagnosis:  Lower urinary tract infectious disease [N39.0] UTI (urinary tract infection) [N39.0] Transaminitis [R74.01]  Generalized weakness [R53.1] Sepsis ([place]) [A41.9] Constipation, unspecified constipation type [K59.00] Hypothyroidism, unspecified type [E03.9] Patient Active Problem List   Diagnosis Date Noted   . Transaminitis   . UTI (urinary tract infection) 05/15/2019  . Sepsis ([place]) 05/15/2019  . Hypothyroidism 03/11/2019  . Hyperthyroidism 03/02/2019  . Acute delirium 03/02/2019  . Acute encephalopathy 02/27/2019  . Vertigo 10/16/2018  . Chronic cough 01/16/2018  . Dementia ([place]) 03/08/2017  . Headache 10/01/2015  . Dark stools 06/22/2015  . History of CVA (cerebrovascular accident) 06/17/2014  . Healthcare maintenance 06/16/2014  . Frequent falls 04/28/2014  . Bradycardia 11/20/2013  . Hypercalcemia 06/07/2013  . Knee pain, chronic 01/14/2013  . Generalized weakness 09/22/2011  . NONEXUDATIVE SENILE MACULAR DEGENERATION RETINA 03/20/2008  . Constipation 11/27/2006  . HLD (hyperlipidemia) 12/15/2005  . PULMONARY EMBOLISM, HX OF 12/15/2005  . Essential hypertension 10/26/2005  . GERD 10/26/2005  . Osteoarthritis of multiple joints 10/26/2005  . Knee joint replacement by other means 10/26/2005   PCP:  Delice Bison, DO Pharmacy:   [place], [place] [address] Phone: [phone] Fax: [phone]  [place], [place] [address] Phone: [phone] Fax: [phone]     Social Determinants of Health (SDOH) Interventions    Readmission Risk Interventions No flowsheet data found.

## 2019-05-19 NOTE — Progress Notes (Signed)
 NAME:  Jennifer Owen, MRN:  [id number], DOB:  02/12/1931, LOS: 4 ADMISSION DATE:  05/15/2019   Brief History  86 yo female with dementia, hyperthyroidism, hot and cold thyroid nodules and hypertension who was admitted to IMTS on 05/15/19 for progressive AMS beyond her baseline over the past week.   Subjective/Interm history  No overnight events. Pt looking more alert this morning however daughter noting that pt continues to spit out everything po.   Significant Hospital Events   5/5> hospital admission. Bili 4.4. Alk phos 1300. No stones or CBD dilation on RUQ US.  UA possible UTI.  5/6> hypokalemic at 2.6>10 runs of potassium given. Hypoglycemic>D5LR. PT recommending SNF 5/7>K 3.0 this morning>6 runs of K given. MRCP for elevated LFTs normal. Fleets and mag citrate>2lg bowel movements 5/8>GI consulted for cholestasis not improving with IVF>obtaining autoimmune labs 5/9>concern for swallowing issues>reconsulting SLP to re-evaluate.  Objective   Blood pressure (!) 101/52, pulse 73, temperature 98.2 F (36.8 C), temperature source Oral, resp. rate 16, height 5' 6" (1.676 m), weight 76.5 kg, SpO2 98 %.     Intake/Output Summary (Last 24 hours) at 05/19/2019 0602 Last data filed at 05/18/2019 1900 Gross per 24 hour  Intake 60 ml  Output 150 ml  Net -90 ml   Filed Weights   05/16/19 1500 05/17/19 0609 05/19/19 0500  Weight: 75 kg 74.6 kg 76.5 kg    Examination: GENERAL: chronically ill appearing in no acute distress HEENT: mandibular prognathia. No significant tenderness to palpation over TMJ. Mucous membranes moist. Unable to visualize oral cavity as pt unable to follow commands due to her dementia CARDIAC: RRR. Peripheries warm  PULMONARY: lungs clear ABDOMEN: soft. bs active NEURO: alert. nonverbal  Consults:  none  Significant Diagnostic Tests:  5/5 CXR>>mild pulm edema. After personal review, I am also noting a right hilar circumferential opacity. Appears calcified  peripherally and does not appear spiculated. Has bronchograms visible within it   5/5 head CT>>no acute findings. Old right sided occipital infarct apparent  5/5 CT a/p>>large inspissated stool ball distending the rectum with rectal distention to 9.3 cm suggesting fecal impaction.  5/5 RUQ US>>gallbladder sludge. No features to suggest cholecytitis. Unable to visualize CBD due to bowel gas 5/7 MRCP>>severely degraded due to motion artifact but no significant findings appreciated  Micro Data:  5/5 blood cultures>>NG  Antimicrobials:  Rocephin 5/5>>5/6  Summary  88 yof with dementia at baseline who was admitted to IMTS on 05/15/19 for one week hx of progressively altered mental status and diarrhea who was found to have a fecal impaction and dehydration  Resolved/stable hospital problems  Acute metabolic encephalopathy-resolved 5/7  Pre-renal AKI-resolved 5/7 Macrocytic anemia Fecal impaction Hypertension. Pressures stable off home antihypertensives. Assessment & Plan:  Active Problems:   Constipation   Generalized weakness   Hypothyroidism   UTI (urinary tract infection)   Sepsis (HCC)   Transaminitis   Hypokalemia. Labs still not back this afternoon--ordered this morning. Plan: pending lab return  Cholestasis. Initially suspected 2/2 volume depletion however minimal alk phos improvement with volume replacement. Obtained MRCP which was motion artifact degraded however did not reveal any explanation. Reached out to GI yesterday to discuss further workup. Obtaining autoimmune labs Also considered cholestasis 2/2 recent initiation of methimazole back in March which GI notes is typically seen 2-12w following initiation of therapy.  Discussed possiblity of discharge with GI yesterday who notes that she can follow up in their clinic for results. Plan: Continue to hold methimazole. Follow   autoimmune labs. Follow up with GI following discharge  Hyperthyroidism with hot and cold nodules.    TSH on admission ~5. fT4 mildly elevated at 1.38. fT3 mildly low at 1.8. Plan: continue holding methimazole. Endocrine outpatient evaluation  Decline in functional status. PT recommending SNF/memory care. TOC consulted and FL2 sent 5/7 however unsure if they spoke with daughter regarding placement. Daughter is wishing for patient to return home with her.  High risk malnutrition due to cognitive based dysphasia. SLP evaluated on admission and recommended full liquid diet. Daughter expressing concern over pt's lack of oral intake and spitting out food. Will reconsult with SLP for re-evaluation however suspect that she is progressing to end stage dementia. Follow up SLP eval.  QT prolongation. 540 on admission. Avoid QT prolonging agents.  Best practice:  CODE STATUS: DNR/DNI Diet: full liquids DVT for prophylaxis: lovenox Social considerations/Family communication: daughter updated at bedside Dispo: discharge pending SLP eval  Will need close followup with endocrinology outpatient regarding nodules and for hyperthyroid management. Follow up with GI for cholestasis   [name], MD INTERNAL MEDICINE RESIDENT PGY-1 PAGER #: [phone] 05/19/19  6:02 AM  Labs    CBC Latest Ref Rng & Units 05/17/2019 05/16/2019 05/15/2019  WBC 4.0 - 10.5 K/uL 9.4 13.8(H) 14.2(H)  Hemoglobin 12.0 - 15.0 g/dL 10.8(L) 10.3(L) 12.3  Hematocrit 36.0 - 46.0 % 33.4(L) 31.6(L) 39.2  Platelets 150 - 400 K/uL 298 309 342   BMP Latest Ref Rng & Units 05/18/2019 05/17/2019 05/17/2019  Glucose 70 - 99 mg/dL 108(H) 124(H) 139(H)  BUN 8 - 23 mg/dL 7(L) 8 12  Creatinine 0.44 - 1.00 mg/dL 0.87 0.86 0.80  BUN/Creat Ratio 12 - 28 - - -  Sodium 135 - 145 mmol/L 144 143 145  Potassium 3.5 - 5.1 mmol/L 3.5 3.7 3.0(L)  Chloride 98 - 111 mmol/L 108 108 110  CO2 22 - 32 mmol/L 23 21(L) 26  Calcium 8.9 - 10.3 mg/dL 9.9 9.8 9.6

## 2019-05-19 NOTE — TOC Progression Note (Deleted)
Transition of Care [facility]) - Progression Note    Patient Details  Name: Jennifer Owen MRN: [id number] Date of Birth: 1932/01/07  [facility]) CM/SW Contact  Terrial Rhodes, LCSWA Phone Number: 05/19/2019, 9:25 AM  Clinical Narrative:     CSW spoke with [name] and offered SNF choices for patient. Patients daughter will call CSW back tomorrow with SNF choice. She wants to discuss with family and not sure if she will be able to make a decision today with it being a holiday. CSW let her know to follow up with Social worker tomorrow with SNF choice.  SNF choice pending. Insurance auth pending.         Expected Discharge Plan and Services                                                 Social Determinants of Health (SDOH) Interventions    Readmission Risk Interventions No flowsheet data found.

## 2019-05-19 NOTE — Progress Notes (Addendum)
Patient with dementia and significantly elevated alk phos levels with mildly elevated AST and ALT levels. Normal biliary tree on ultrasound, CT and limited on MRI.  Po intake remains diminished.  Her labs today are stable.  Alk phos 1020.  AST 66.  ALT 63.  Total bili 2.0.  GGT 617.  Numerous mild laboratory studies pending including IgM, IgG, IgG4, 5  nucleotidase, smooth muscle antibody, ANA, AMA and alk phos isoenzymes.  Hold methimazole. Treat hyperthyroidism.    Please call the on-call gastroenterologist if these lab results are available prior to discharge. Otherwise, patient will require follow-up in office as outpatient. Will arrange with Dr. Tarri Glenn.

## 2019-05-19 NOTE — Plan of Care (Signed)
  Problem: Education: Goal: Knowledge of General Education information will improve Description Including pain rating scale, medication(s)/side effects and non-pharmacologic comfort measures Outcome: Progressing   

## 2019-05-20 ENCOUNTER — Telehealth: Payer: Self-pay

## 2019-05-20 DIAGNOSIS — R1319 Other dysphagia: Secondary | ICD-10-CM

## 2019-05-20 DIAGNOSIS — G308 Other Alzheimer's disease: Secondary | ICD-10-CM

## 2019-05-20 DIAGNOSIS — K831 Obstruction of bile duct: Secondary | ICD-10-CM | POA: Diagnosis present

## 2019-05-20 DIAGNOSIS — R1310 Dysphagia, unspecified: Secondary | ICD-10-CM

## 2019-05-20 DIAGNOSIS — F0281 Dementia in other diseases classified elsewhere with behavioral disturbance: Secondary | ICD-10-CM

## 2019-05-20 DIAGNOSIS — N179 Acute kidney failure, unspecified: Secondary | ICD-10-CM | POA: Diagnosis present

## 2019-05-20 LAB — IGM: IgM (Immunoglobulin M), Srm: 22 mg/dL — ABNORMAL LOW (ref 26–217)

## 2019-05-20 LAB — COMPREHENSIVE METABOLIC PANEL
ALT: 49 U/L — ABNORMAL HIGH (ref 0–44)
AST: 53 U/L — ABNORMAL HIGH (ref 15–41)
Albumin: 1.9 g/dL — ABNORMAL LOW (ref 3.5–5.0)
Alkaline Phosphatase: 783 U/L — ABNORMAL HIGH (ref 38–126)
Anion gap: 9 (ref 5–15)
BUN: 5 mg/dL — ABNORMAL LOW (ref 8–23)
CO2: 24 mmol/L (ref 22–32)
Calcium: 9 mg/dL (ref 8.9–10.3)
Chloride: 110 mmol/L (ref 98–111)
Creatinine, Ser: 0.7 mg/dL (ref 0.44–1.00)
GFR calc Af Amer: >60 mL/min (ref >60)
GFR calc non Af Amer: >60 mL/min (ref >60)
Glucose, Bld: 89 mg/dL (ref 70–99)
Potassium: 3 mmol/L — ABNORMAL LOW (ref 3.5–5.1)
Sodium: 143 mmol/L (ref 135–145)
Total Bilirubin: 1.6 mg/dL — ABNORMAL HIGH (ref 0.3–1.2)
Total Protein: 5.4 g/dL — ABNORMAL LOW (ref 6.5–8.1)

## 2019-05-20 LAB — NUCLEOTIDASE, 5', BLOOD: 5-Nucleotidase: 46 IU/L — ABNORMAL HIGH (ref 0–10)

## 2019-05-20 LAB — ANA: Anti Nuclear Antibody (ANA): NEGATIVE

## 2019-05-20 LAB — CBC
HCT: 32.1 % — ABNORMAL LOW (ref 36.0–46.0)
Hemoglobin: 10.3 g/dL — ABNORMAL LOW (ref 12.0–15.0)
MCH: 32.2 pg (ref 26.0–34.0)
MCHC: 32.1 g/dL (ref 30.0–36.0)
MCV: 100.3 fL — ABNORMAL HIGH (ref 80.0–100.0)
Platelets: 274 10*3/uL (ref 150–400)
RBC: 3.2 MIL/uL — ABNORMAL LOW (ref 3.87–5.11)
RDW: 15.8 % — ABNORMAL HIGH (ref 11.5–15.5)
WBC: 5.9 10*3/uL (ref 4.0–10.5)
nRBC: 0 % (ref 0.0–0.2)

## 2019-05-20 LAB — ANTI-SMOOTH MUSCLE ANTIBODY, IGG: F-Actin IgG: 16 Units (ref 0–19)

## 2019-05-20 LAB — MITOCHONDRIAL ANTIBODIES: Mitochondrial M2 Ab, IgG: <20 Units (ref 0.0–20.0)

## 2019-05-20 LAB — IGG: IgG (Immunoglobin G), Serum: 1171 mg/dL (ref 586–1602)

## 2019-05-20 LAB — GLUCOSE, CAPILLARY: Glucose-Capillary: 86 mg/dL (ref 70–99)

## 2019-05-20 MED ORDER — POTASSIUM CHLORIDE 20 MEQ PO PACK: 40.0000 meq | PACK | Freq: Every day | ORAL | Status: DC

## 2019-05-20 MED ORDER — POTASSIUM CHLORIDE 10 MEQ/100ML IV SOLN
10.0000 meq | INTRAVENOUS | Status: AC
  Administered 2019-05-20 (×6): 10 meq via INTRAVENOUS
  Filled 2019-05-20 (×5): qty 100

## 2019-05-20 MED ORDER — POLYETHYLENE GLYCOL 3350 17 G PO PACK: 17.0000 g | PACK | Freq: Every day | ORAL | 0 refills | Status: AC

## 2019-05-20 MED ORDER — BISACODYL 10 MG RE SUPP: 10.0000 mg | Freq: Once | RECTAL | 0 refills | Status: AC

## 2019-05-20 MED ORDER — RAMELTEON 8 MG PO TABS: 8.0000 mg | ORAL_TABLET | Freq: Every day | ORAL | Status: AC

## 2019-05-20 NOTE — Social Work (Signed)
Clinical Social Worker facilitated patient discharge including contacting patient family and facility to confirm patient discharge plans.  Clinical information faxed to facility and family agreeable with plan.  CSW arranged ambulance transport via [facility] to [facility] RN to call [phone]  with report prior to discharge.  Clinical Social Worker will sign off for now as social work intervention is no longer needed. Please consult us again if new need arises.  Abbigayle Toole, MSW, LCSW Clinical Social Worker   

## 2019-05-20 NOTE — Progress Notes (Signed)
UPDATE NOTE:  Notified that pt's daughter, Drenda Freeze, is at bedside. Pt's other daughter, Ms. Davonna Belling, was also involved via speakerphone.  We discussed pt's overall health and her significantly decreased po intake of food, fluids, medication. We spoke about the course of dementia and the high likelihood that her diminished oral intake is attributable to progression to end stage.  Drenda Freeze was initially under the impression that IVF could be administered at the facility. We spoke about long term goals including medical management, symptomatic management (palliative), and hospice. We discussed that IVF are not a permanent solution and have their own risks. Additionally, discussed that nutrition is necessary to sustain life and that IVF alone would not do this.  After a lengthy conversation with Taeja's daughters, they elected to focus on symptomatic management. They implied understanding that this would mean that IVF would not be administered at the facility and that palliative care would be involved in her care after being admitted to the facility. They agree with this plan and have no further questions at this time.  Greatly appreciate social work's assistance in arrangements.   Elige Radon, MD 05/20/19  2:26 PM

## 2019-05-20 NOTE — Progress Notes (Signed)
Physical Therapy Treatment Patient Details Name: Jennifer Owen MRN: [id number] DOB: [birth year] Today's Date: 05/20/2019    History of Present Illness Pt is an 86 y/o female admitted secondary to sepsis from UTI. PMH includes dementia, HTN, polio and PE.     PT Comments    Pt was seen for ROM and strengthening to LE's with pt requiring a great deal of assistance to move her legs.  Declined to sit up and was repositioned with max assist using trendelenburg feature on the bed. Pt is minimally able to tolerate the recline, but once back upright was much more comfortable.  Follow acutely for progression for increasing strength and working on standing tolerance as pt is able.    Follow Up Recommendations  SNF;Supervision/Assistance - 24 hour     Equipment Recommendations  None recommended by PT    Recommendations for Other Services       Precautions / Restrictions Precautions Precautions: Fall Precaution Comments: monitor for pain Restrictions Weight Bearing Restrictions: No    Mobility  Bed Mobility Overal bed mobility: Needs Assistance Bed Mobility: (scooting up in bed)           General bed mobility comments: max assist to scoot up in bed  Transfers                 General transfer comment: declined OOB  Ambulation/Gait                 Stairs             Wheelchair Mobility    Modified Rankin (Stroke Patients Only)       Balance                                            Cognition Arousal/Alertness: Awake/alert Behavior During Therapy: Flat affect Overall Cognitive Status: History of cognitive impairments - at baseline                                 General Comments: Dementia at baseline. Minimal purposeful command following- needs assist for all tasks      Exercises General Exercises - Lower Extremity Ankle Circles/Pumps: AROM;5 reps Heel Slides: AAROM;10 reps Hip ABduction/ADduction:  AAROM;10 reps Straight Leg Raises: AAROM;10 reps Hip Flexion/Marching: 10 reps;AAROM    General Comments General comments (skin integrity, edema, etc.): pt was seen for mobility and declined OOB but agreed to exercises, limited by general weakness      Pertinent Vitals/Pain Pain Assessment: Faces Faces Pain Scale: No hurt Pain Intervention(s): Limited activity within patient's tolerance;Monitored during session;Repositioned    Home Living                      Prior Function            PT Goals (current goals can now be found in the care plan section) Acute Rehab PT Goals Patient Stated Goal: for pt to return home per daughter Progress towards PT goals: Progressing toward goals    Frequency    Min 2X/week      PT Plan Current plan remains appropriate    Co-evaluation              AM-PAC PT "6 Clicks" Mobility   Outcome Measure  Help needed turning from your back  to your side while in a flat bed without using bedrails?: A Lot Help needed moving from lying on your back to sitting on the side of a flat bed without using bedrails?: A Lot Help needed moving to and from a bed to a chair (including a wheelchair)?: Total Help needed standing up from a chair using your arms (e.g., wheelchair or bedside chair)?: Total Help needed to walk in hospital room?: Total Help needed climbing 3-5 steps with a railing? : Total 6 Click Score: 8    End of Session   Activity Tolerance: Patient tolerated treatment well Patient left: in bed;with call bell/phone within reach;with bed alarm set Nurse Communication: Mobility status PT Visit Diagnosis: Unsteadiness on feet (R26.81);Muscle weakness (generalized) (M62.81);Difficulty in walking, not elsewhere classified (R26.2)     Time: 0962-8366 PT Time Calculation (min) (ACUTE ONLY): 21 min  Charges:  $Therapeutic Exercise: 8-22 mins                    Ivar Drape 05/20/2019, 10:07 PM  Samul Dada, PT MS Acute Rehab  Dept. Number: [facility] [id number] and [facility] [phone]

## 2019-05-20 NOTE — Telephone Encounter (Signed)
-----   Message from Tressia Danas, MD sent at 05/19/2019  3:48 PM EDT ----- Bonita Quin, next available will be okay. She will have post hospitalization outpatient follow-up with Family Medicine.   KLB ----- Message ----- From: Arnaldo Natal, NP Sent: 05/19/2019   3:26 PM EDT To: Chrystie Nose, RN, Tressia Danas, MD  [facility], this patient is currently in the hospital. She will most likely be discharged home with her daughter in the next few days. Please call the daughter (patient has dementia) in a few days to schedule a follow up appointment with Dr. Orvan Falconer within the next 2 weeks. Thank you.

## 2019-05-20 NOTE — Progress Notes (Signed)
Called report to Arnett at [facility].

## 2019-05-20 NOTE — Progress Notes (Signed)
Communication note:  Daughter has not been present in patient's room today. Needing to discuss GOC with her. Attempted to contact both daughters however no answer. HIPPA compliant vm left to return call to the nurse's station. Will continue to attempt to contact them.  Elige Radon, MD

## 2019-05-20 NOTE — Progress Notes (Signed)
  Date: 05/20/2019  Patient name: Jennifer Owen  Medical record number: [id number]  Date of birth: 11-Feb-1931        I have seen and evaluated this patient and I have discussed the plan of care with the house staff. Please see Dr. Luciana Axe note for complete details. I concur with her findings and plan.  Plan for discharge today to [facility] with Hospice/Palliative care.    Inez Catalina, MD 05/20/2019, 3:17 PM

## 2019-05-20 NOTE — Progress Notes (Signed)
NAME:  Jennifer Owen, MRN:  [id number], DOB:  09/02/1931, LOS: 5 ADMISSION DATE:  05/15/2019   Brief History  86 yo female with dementia, hyperthyroidism, hot and cold thyroid nodules and hypertension who was admitted to [facility] on 05/15/19 for progressive AMS beyond her baseline over the past week.   Subjective/Interm history  No overnight events   Significant Hospital Events   5/5> hospital admission. Bili 4.4. Alk phos 1300. No stones or CBD dilation on RUQ [place].  UA possible UTI.  5/6> hypokalemic at 2.6>10 runs of potassium given. Hypoglycemic>D5LR. PT recommending SNF 5/7>K 3.0 this morning>6 runs of K given. MRCP for elevated LFTs normal. Fleets and mag citrate>2lg bowel movements 5/8>GI consulted for cholestasis not improving with IVF>obtaining autoimmune labs 5/9>daughter now reporting pt unable to eat/drink>reconsulted SLP>no swallowing issues however poor po intake likely end stage dementia 5/10>planning to have [place] talk today. Might be able to discharge later pending the results of that talk.  Objective   Blood pressure 101/78, pulse 65, temperature (!) 97.5 F (36.4 C), temperature source Oral, resp. rate 17, height '5\' 6"'  (1.676 m), weight 75.5 kg, SpO2 100 %.     Intake/Output Summary (Last 24 hours) at 05/20/2019 0546 Last data filed at 05/19/2019 1300 Gross per 24 hour  Intake 60 ml  Output 200 ml  Net -140 ml   Filed Weights   05/17/19 0609 05/19/19 0500 05/19/19 0617  Weight: 74.6 kg 76.5 kg 75.5 kg    Examination: GENERAL: chronically ill appearing in no acute distress CARDIAC: RRR. Peripheries warm  PULMONARY: lungs clear ABDOMEN: soft. bs active NEURO: alert. nonverbal  Consults:  none  Significant Diagnostic Tests:  5/5 CXR>>mild pulm edema. After personal review, I am also noting a right hilar circumferential opacity. Appears calcified peripherally and does not appear spiculated. Has bronchograms visible within it   5/5 head CT>>no acute findings. 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  5/5 RUQ US>>gallbladder sludge. No features to suggest cholecytitis. Unable to visualize CBD due to bowel gas 5/7 MRCP>>severely degraded due to motion artifact but no significant findings appreciated  Micro Data:  5/5 blood cultures>>NG  Antimicrobials:  Rocephin 5/5>>5/6  Summary  74 yof with dementia at baseline who was admitted to IMTS on 05/15/19 for one week hx of progressively altered mental status and diarrhea who was found to have a fecal impaction and dehydration. Unfortunately, I suspect that her dementia has progressed to end stage resulting in the presenting dehydration. SLP has evaluated her twice this admission, both times noting their findings to be consistent with cognitive dysphagia.  Resolved/stable hospital problems  Acute metabolic encephalopathy-resolved 5/7  Pre-renal AKI-resolved 5/7 Macrocytic anemia Fecal impaction Hypertension. Pressures stable off home antihypertensives. Assessment & Plan:  Active Problems:   Constipation   Generalized weakness   Hypothyroidism   UTI (urinary tract infection)   Sepsis (HCC)   Transaminitis   Moderate malnutrition (HCC)   Abnormal liver enzymes   End Stage Dementia. Poor po intake likely a progression of her dementia. Daughter had noted a few days ago that pt was eating/drinking however yesterday on rounds, when discussing plan for discharge, daughter noted that pt has refused eating or drinking anything this admission. Since she has been the primary person assisting with feeds, unsure what to make of this. I reconsulted speech yesterday for re-evaluation who again noted that she did not  have a physical issue with swallowing however does show signs of cognitive dysphasia which is likely progression of her dementia.  High risk malnutrition due to cognitive based  dysphasia. Decline in functional status. PT recommending SNF/memory care. TOC consulted and FL2 sent 5/7 however unsure if they spoke with daughter regarding placement. Daughter is wishing for patient to return home with her. Plan: will need to revisit this issue this morning. As suspected and reconfirmed with speech evaluations, pt's limted intake and likely reason for admission, is progression of her dementia resulting in cognitive dysphasia. Daughter will need to make decision regarding goals of care in regards to either transitioning to comfort care or to continuing to pursue aggressive care and place feeding tube. I do not think that placement of a feeding tube would improve this patient's quality of life but would merely extend it.  Hypokalemia.  Continues to be hypokalemia--likely from poor po intake. K 3.0 this morning. Plan: repleting this morning. Continue to replete as needed.  Cholestasis. Alk phos/bili coming down. Alk phos and GGT remain fairly significantly elevated.  Plan: Continue to hold methimazole. Follow autoimmune labs. Follow up with GI following discharge  Hyperthyroidism with hot and cold nodules.  TSH on admission ~5. fT4 mildly elevated at 1.38. fT3 mildly low at 1.8. Plan: continue holding methimazole. Endocrine outpatient evaluation  QT prolongation. 540 on admission. Avoid QT prolonging agents.  Best practice:  CODE STATUS: DNR/DNI Diet: full liquids DVT for prophylaxis: lovenox Social considerations/Family communication: [place] conversation with daughter today Dispo:  D/c pending [place] talk today. Will need close followup with endocrinology outpatient regarding nodules and for hyperthyroid management. Follow up with GI for cholestasis   [name], MD [place] PGY-1 PAGER #: [phone] 05/20/19  5:46 AM  Labs    CBC Latest Ref Rng & Units 05/20/2019 05/17/2019 05/16/2019  WBC 4.0 - 10.5 K/uL 5.9 9.4 13.8(H)  Hemoglobin 12.0 - 15.0 g/dL  10.3(L) 10.8(L) 10.3(L)  Hematocrit 36.0 - 46.0 % 32.1(L) 33.4(L) 31.6(L)  Platelets 150 - 400 K/uL 274 298 309   BMP Latest Ref Rng & Units 05/20/2019 05/19/2019 05/18/2019  Glucose 70 - 99 mg/dL 89 84 108(H)  BUN 8 - 23 mg/dL 5(L) 8 7(L)  Creatinine 0.44 - 1.00 mg/dL 0.70 0.77 0.87  BUN/Creat Ratio 12 - 28 - - -  Sodium 135 - 145 mmol/L 143 145 144  Potassium 3.5 - 5.1 mmol/L 3.0(L) 3.2(L) 3.5  Chloride 98 - 111 mmol/L 110 111 108  CO2 22 - 32 mmol/L '24 25 23  ' Calcium 8.9 - 10.3 mg/dL 9.0 9.5 9.9

## 2019-05-20 NOTE — Discharge Summary (Signed)
Name: Jennifer Owen MRN: [id number] DOB: 1932-01-10 86 y.o. PCP: Delice Bison, DO  Date of Admission: 05/15/2019 12:40 PM Date of Discharge: 05/20/19 Attending Physician: Sid Falcon, MD  Discharge Diagnosis: 1. Acute metabolic encephalopathy due to dehydration/AKI 2. End stage dementia 3. Functional Decline 4. Acute Kidney Injury 5. Cholestasis 6. Hyperthyroidism 7. Hot and cold thyroid nodules 8. Urinary tract infection 9. Fecal impaction   Discharge Medications: Allergies as of 05/20/2019      Reactions   Tramadol Hcl Nausea Only, Other (See Comments)   Stomach irritation      Medication List    STOP taking these medications   amLODipine 10 MG tablet Commonly known as: NORVASC   aspirin 81 MG chewable tablet   atorvastatin 40 MG tablet Commonly known as: LIPITOR   esomeprazole 40 MG capsule Commonly known as: NEXIUM   hydrochlorothiazide 25 MG tablet Commonly known as: HYDRODIURIL   memantine 5 MG tablet Commonly known as: NAMENDA   methimazole 10 MG tablet Commonly known as: TAPAZOLE     TAKE these medications   acetaminophen 500 MG tablet Commonly known as: TYLENOL Take 1,000 mg by mouth in the morning.   bisacodyl 10 MG suppository Commonly known as: DULCOLAX Place 1 suppository (10 mg total) rectally once for 1 dose.   diclofenac sodium 1 % Gel Commonly known as: Voltaren Apply 2 g upto 4 times a day to your knees as needed for knee pain.   polyethylene glycol 17 g packet Commonly known as: MIRALAX / GLYCOLAX Take 17 g by mouth daily. Start taking on: May 21, 2019   QUEtiapine 25 MG tablet Commonly known as: SEROQUEL Take 2 tablets (50 mg total) by mouth at bedtime.   ramelteon 8 MG tablet Commonly known as: ROZEREM Take 1 tablet (8 mg total) by mouth at bedtime.       Disposition and follow-up:   [name] was discharged from [facility] in [place] condition.  At the hospital follow up  visit please address:  1.  End Stage Dementia. Cognitive dysphagia. Goals of care. Pt was transitioned to a palliative/symptomatic management goal directed therapy. Please ensure palliative care is on board to assist.  2.  Cholestasis. RUQ [place] and MRCP unrevealing for source. GI consulted and obtained a lab workup that could have been followed up outpatient however pt's family elected for a more symptomatic management approach at discharge so please discuss with them if this is still something they would want to approach.  3.  Fecal impaction. Improved bowel movements after enema and laxative. Continue to monitor for s/s of discomfort.  2.  Labs / imaging needed at time of follow-up: none  3.  Pending labs/ test needing follow-up: ANA, AMA, ASA, nucleotidase 5, IgG, IgM  Follow-up Appointments:  Contact information for follow-up providers    [facility] Follow up.   Specialty: Internal Medicine Contact information: [address], [place] [address] [phone]       Modena Nunnery D, DO Follow up.   Specialty: Internal Medicine Contact information: [address] [address] [phone]            Contact information for after-discharge care    [facility] .   Service: Skilled Nursing Contact information: [place] [phone]  Hospital Course: 86 yo female with end stage dementia, hyperthyroidism, hot and cold thyroid nodules, and hypertension who presented to [facility] on 05/15/19 for an altered mental status from baseline. At baseline, pt was completely dependent on caregiver (daughter) for ADLs/iADLs.   On presentation, pt was found to be dehydrated with an AKI. AKI improved with IVF. Pt also experienced unremitting hypokalemia which I attribute to her limited po intake.  LFTs were also found to be elevated in a  cholestatic pattern. RUQ [place] and MRCP were unrevealing. Pt was previously on methimazole and this was held as it can cause a cholestatic pattern. Alk phos was 1300 on admission and, although volume repleted, did not show significant improvement. GI was consulted and obtained labs including autoimmune, alk phos isomers. These are still pending on discharge. Pt was transitioned to a more palliative/symptomatic management approach at the end of her hospitalization however f/u with GI should be arranged if goals of care change.  Hospitalization was complicated by minimal po intake. Pt was evaluated twice this admission by SLP who consistently felt that this was a cognitive dysphagia. I suspect this is progression of her dementia and if so, will not reverse. PO intake did not improve and pt required continued fluids.  I held a goals of care discussion with pt's family. Please see my note from 5/10/21for details. Ultimately, pt's daughters Jennifer Owen and Jennifer Owen, elected for a transition to a more symptomatic management approach, understanding that pt was likely to pass fairly quickly if not remaining hydrated. Palliative care will see once admitted to [facility].  Discharge Vitals:   BP 101/78 (BP Location: Right Arm)   Pulse 65   Temp (!) 97.5 F (36.4 C) (Oral)   Resp 17   Ht '5\' 6"'  (1.676 m)   Wt 72.3 kg   SpO2 100%   BMI 25.73 kg/m   Pertinent Labs, Studies, and Procedures:   5/5 CXR>>mild pulm edema. After personal review, I am also noting a right hilar circumferential opacity. Appears calcified peripherally and does not appear spiculated. Has bronchograms visible within it   5/5 head CT>>no acute findings. Old right sided occipital infarct apparent 5/5 CT a/p>>large inspissated stool ball distending the rectum with rectal distention to 9.3 cm suggesting fecal impaction.  5/5 RUQ US>>gallbladder sludge. No features to suggest cholecytitis. Unable to visualize CBD due to bowel gas 5/7 MRCP>>severely  degraded due to motion artifact but no significant findings appreciated  CBC Latest Ref Rng & Units 05/20/2019 05/17/2019 05/16/2019  WBC 4.0 - 10.5 K/uL 5.9 9.4 13.8(H)  Hemoglobin 12.0 - 15.0 g/dL 10.3(L) 10.8(L) 10.3(L)  Hematocrit 36.0 - 46.0 % 32.1(L) 33.4(L) 31.6(L)  Platelets 150 - 400 K/uL 274 298 309   BMP Latest Ref Rng & Units 05/20/2019 05/19/2019 05/18/2019  Glucose 70 - 99 mg/dL 89 84 108(H)  BUN 8 - 23 mg/dL 5(L) 8 7(L)  Creatinine 0.44 - 1.00 mg/dL 0.70 0.77 0.87  BUN/Creat Ratio 12 - 28 - - -  Sodium 135 - 145 mmol/L 143 145 144  Potassium 3.5 - 5.1 mmol/L 3.0(L) 3.2(L) 3.5  Chloride 98 - 111 mmol/L 110 111 108  CO2 22 - 32 mmol/L '24 25 23  ' Calcium 8.9 - 10.3 mg/dL 9.0 9.5 9.9      Signed: [name], MD 05/20/2019, 3:04 PM   Pager: [phone]

## 2019-05-20 NOTE — Progress Notes (Signed)
[facility])  Referral received to follow up with patient at [facility] for outpatient palliative care services.  Family most interested in transitioning to hospice once SNF days are completed.  Wallis Bamberg RN, BSN, CCRN [facility] Liaison

## 2019-05-20 NOTE — TOC Progression Note (Signed)
Transition of Care [facility]) - Progression Note    Patient Details  Name: Jennifer Owen MRN: [id number] Date of Birth: 04/02/1931  Transition of Care [facility]) CM/SW Contact  Doy Hutching, [place] Phone Number: 05/20/2019, 1:58 PM  Clinical Narrative:    CSW has received authorization for pt to dc to [facility]. [id number], Berkley Harvey approval [id number]. CSW spoke with MD Dr. Ephriam Knuckles and pt daughter Drenda Freeze at bedside. We discussed approval for SNF placement and therapies. Pt daughter and this Clinical research associate have both contacted the facility and are awaiting return calls. We discussed palliative care and what services they can provide for pt. We discussed briefly that they cannot force pt to eat or drink and that there may come a time with pt memory loss that she may not desire either. Pt daughter interested in those services but expressed to this writer that she is not sure that she is ready for hospice yet. CSW acknowledged this and let Drenda Freeze know that once we had heard back from SNF that discharge could be today or tomorrow as resident team has cleared pt medically.   CSW was then approached by Dr. Ephriam Knuckles, after this writer left the room pt daughter expressed that she was indeed interested in more comfort focus. CSW and MD discussed letting pt go to SNF under Medicare approval with palliative services. Once pt has transitioned to her Medicaid coverage (no longer receiving therapies etc) that pt could be eligible for hospice and full comfort. CSW also discussed this with Wallis Bamberg, RN liaison with [facility] is aware and their team will follow at [facility].   Continue to await response from SNF admissions liaison for d/c today.    Expected Discharge Plan: Skilled Nursing Facility Barriers to Discharge: Continued Medical Work up  Expected Discharge Plan and Services Expected Discharge Plan: Skilled Nursing Facility Living arrangements for the past 2 months: Single Family Home   Readmission Risk  Interventions No flowsheet data found.

## 2019-05-20 NOTE — TOC Transition Note (Signed)
Transition of Care [facility]) - CM/SW Discharge Note   Patient Details  Name: DREAM HARMAN MRN: [id number] Date of Birth: 1931-04-24  Transition of Care [facility]) CM/SW Contact:  Alexander Mt, LCSW Phone Number: 05/20/2019, 3:12 PM   Clinical Narrative:    CSW met with pt daughter Manus Gunning again, explained arrangements with palliative care and insurance auth. She is aware and okay with palliative and SNF staff assisting pt and pt family with care planning moving forward and that if pt family agreeable to full hospice that [facility] and [facility] can accomodate that. CSW explained pt cleared and will schedule PTAR for pt to be discharged after final iv potassium per RN.   PTAR scheduled for 6:00pm, pt daughter aware they may run behind. PTAR papers on chart, no noted controlled meds and DNR filled out awaiting MD signature.    Final next level of care: Skilled Nursing Facility Barriers to Discharge: Barriers Resolved   Patient Goals and CMS Choice Patient states their goals for this hospitalization and ongoing recovery are:: to go to skilled nursing facility CMS Medicare.gov Compare Post Acute Care list provided to:: Patient Represenative (must comment)(pt daughter Manus Gunning) Choice offered to / list presented to : Adult Children  Discharge Placement PASRR number recieved: 05/17/19            Patient chooses bed at: [facility] Patient to be transferred to facility by: [place] Name of family member notified: pt daughter Manus Gunning at bedside Patient and family notified of of transfer: 05/20/19  Readmission Risk Interventions No flowsheet data found.

## 2019-05-20 NOTE — Telephone Encounter (Signed)
Pt scheduled to see Dr. Orvan Falconer 06/26/19 at 10:30am. Appt letter mailed to pt.

## 2019-05-20 NOTE — Progress Notes (Signed)
Nutrition Follow-up  DOCUMENTATION CODES:   Not applicable  INTERVENTION:   -Continue 30 ml Prostat TID, each supplement provides 100 kcals and 15 grams protein -Continue MVI with minerals daily -Continue Magic cup TID with meals, each supplement provides 290 kcal and 9 grams of protein -Continue Hormel Shake TID with meals, each supplement provides 520 kcals and 22 grams protein -Continue feeding assistance with meals  NUTRITION DIAGNOSIS:   Inadequate oral intake related to dysphagia, lethargy/confusion as evidenced by meal completion < 25%.  Ongoing  GOAL:   Patient will meet greater than or equal to 90% of their needs  Progressing   MONITOR:   PO intake, Supplement acceptance, Diet advancement, Labs, Weight trends, Skin, I & O's  REASON FOR ASSESSMENT:   Consult Assessment of nutrition requirement/status, Poor PO  ASSESSMENT:   Jennifer Owen is a 86 year old F with significant PMH of dementia, hyperthyroidism, hypertension, and GERD, who was brought in by her daughter for AMS and poor PO intake.  5/7- s/p BSE- advanced to full liquid diet  Reviewed I/O's: -740 ml x 24 hours and +105 ml since admission  UOP: 800 ml x 24 hours  SLP re-evaluated pt yesterday due to concern for poor oral intake. Pt continues to hold food in her mouth. Meal completion documented at 0%. At this time, SLP unable to determine if poor oral intake is related to acute mental status changes or progression of dementia. Nutrition support via feeding tube is not recommended in patients with advanced age with dementia.   RD will continue to offer a wide range of oral nutrition supplements on meal trays and between meals to help maximize oral intake. Of note, pt has been refusing most oral medications and Prostat supplements.   Per [facility] team notes, pt daughter has accepted bed for patient at [facility]. Plan for discharge to SNF pending bed availability.   Labs reviewed: K: 3.0 (on IV  supplementation). CBGS: 104.   Diet Order:   Diet Order            Diet full liquid Room service appropriate? No; Fluid consistency: Thin  Diet effective now              EDUCATION NEEDS:   No education needs have been identified at this time  Skin:  Skin Assessment: Reviewed RN Assessment  Last BM:  05/19/19  Height:   Ht Readings from Last 1 Encounters:  05/16/19 5\' 6"  (1.676 m)    Weight:   Wt Readings from Last 1 Encounters:  05/20/19 72.3 kg    Ideal Body Weight:  59.1 kg  BMI:  Body mass index is 25.73 kg/m.  Estimated Nutritional Needs:   Kcal:  1650-1850  Protein:  80-95 grams  Fluid:  > 1.6 L   07/20/19, RD, LDN, CDCES Registered Dietitian II Certified Diabetes Care and Education Specialist Please refer to [facility] for RD and/or RD on-call/weekend/after hours pager

## 2019-05-21 LAB — CULTURE, BLOOD (ROUTINE X 2)
Culture: NO GROWTH
Culture: NO GROWTH
Special Requests: ADEQUATE
Special Requests: ADEQUATE

## 2019-05-21 LAB — IGG 4: IgG, Subclass 4: 2 mg/dL (ref 2–96)

## 2019-05-21 LAB — MISC LABCORP TEST (SEND OUT): Labcorp test code: 1612

## 2019-05-22 ENCOUNTER — Ambulatory Visit: Payer: Medicare Other

## 2019-05-22 DIAGNOSIS — E785 Hyperlipidemia, unspecified: Secondary | ICD-10-CM

## 2019-05-22 DIAGNOSIS — I10 Essential (primary) hypertension: Secondary | ICD-10-CM

## 2019-05-22 NOTE — Chronic Care Management (AMB) (Signed)
Care Management   Follow Up Note   05/22/2019 Name: Jennifer Owen MRN: [id number] DOB: 07-05-1931  Referred by: Bridget Hartshorn, DO Reason for referral : Care Coordination (Placement)   Jennifer Owen is a 86 y.o. year old female who is a primary care patient of Bridget Hartshorn, DO. The care management team was consulted for assistance with care management and care coordination needs.    Review of patient status, including review of consultants reports, relevant laboratory and other test results, and collaboration with appropriate care team members and the patient's provider was performed as part of comprehensive patient evaluation and provision of chronic care management services.    SDOH (Social Determinants of Health) assessments performed: No See Care Plan activities for detailed interventions related to [facility])     Advanced Directives: See Care Plan and Vynca application for related entries.   Goals Addressed            This Visit's Progress   . Per daughter, mom needs around the clock care (pt-stated)       Current Barriers:  Marland Kitchen Knowledge Barriers related to resources to address level of care.  Case Manager Clinical Goal(s):  Marland Kitchen Over the next 120 days, patient will work with BSW to address needs related to level of care concerns; securing placement . Over the next 120 days, BSW will collaborate with RN Care Manager to address care management and care coordination needs  Interventions:   . Talked with patient's daughter, [name], regarding patient's current SNF placement.  Patient currently at [facility].  Daughter unsure if long-term placement at this facility is an option.  Stated that she has not been successful in connecting with the Social Worker there to discuss long term plan. . Contacted [facility] Social Worker, Rochele Pages.  Informed her that patient/family would like to discuss long-term placement options with her. Ms. Charlsie Quest stated that she would  call the family. Rebbeca Paul back and she reported that her sister was currently on the phone with Ms. Charlsie Quest.  Patient Self Care Activities:  . Attends all scheduled provider appointments . Acknowledges need for need for assistance and that this is difficult for family members to provide  Please see past updates related to this goal by clicking on the "Past Updates" button in the selected goal      . COMPLETED: Per daughter, mom needs personal care services until she goes to long term care facility (pt-stated)       Current Barriers:  Marland Kitchen Knowledge Barriers related to resources available to address Level of care concerns; obtaining Personal Care Services   Case Manager Clinical Goal(s):  Marland Kitchen Over the next 30 days, patient will work with BSW to address needs related to Level of care concerns; obtaining personal care services  . Over the next 30 days, BSW will collaborate with RN Care Manager to address care management and care coordination needs  Interventions:  . Received voicemail from patient's daughter, [name], stating that PCS agency was selected but they have not heard back from [facility] regarding start of services. [name] [facility] regarding status of services.  Per representative, [facility] has been selected by patient/caregivers on multiple occassions but they have denied each request due to lack of staffing.  [name] patient's daughters Drenda Freeze and [name] and provided above update.  . Informed daughters that another PCS agency will need to be selected due to lack of staffing at [facility].  They confirmed  that they still have list of PCS agencies provided by [facility] . Encouraged daughters to contact selected agency prior to contacting [facility] to ensure request can be accommodated.   Liana Gerold to contact PCS agencies on their behalf; daughter stated she would call back if assistance is needed.       Patient Self Care Activities:  . Attends  all scheduled provider appointments  Please see past updates related to this goal by clicking on the "Past Updates" button in the selected goal          Telephone follow up appointment with care management team member scheduled for:05/31/19   Ronn Melena, [place] Coordination Social Worker [place] [phone]

## 2019-05-22 NOTE — Patient Instructions (Signed)
Visit Information  Goals Addressed            This Visit's Progress   . Per daughter, mom needs around the clock care (pt-stated)       Current Barriers:  Marland Kitchen Knowledge Barriers related to resources to address level of care.  Case Manager Clinical Goal(s):  Marland Kitchen Over the next 120 days, patient will work with BSW to address needs related to level of care concerns; securing placement . Over the next 120 days, BSW will collaborate with RN Care Manager to address care management and care coordination needs  Interventions:   . Talked with patient's daughter, [name], regarding patient's current SNF placement.  Patient currently at [facility].  Daughter unsure if long-term placement at this facility is an option.  Stated that she has not been successful in connecting with the Social Worker there to discuss long term plan. . Contacted [facility] Social Worker, Rochele Pages.  Informed her that patient/family would like to discuss long-term placement options with her. Ms. Charlsie Quest stated that she would call the family. Rebbeca Paul back and she reported that her sister was currently on the phone with Ms. Charlsie Quest.  Patient Self Care Activities:  . Attends all scheduled provider appointments . Acknowledges need for need for assistance and that this is difficult for family members to provide  Please see past updates related to this goal by clicking on the "Past Updates" button in the selected goal      . COMPLETED: Per daughter, mom needs personal care services until she goes to long term care facility (pt-stated)       Current Barriers:  Marland Kitchen Knowledge Barriers related to resources available to address Level of care concerns; obtaining Personal Care Services   Case Manager Clinical Goal(s):  Marland Kitchen Over the next 30 days, patient will work with BSW to address needs related to Level of care concerns; obtaining personal care services  . Over the next 30 days, BSW will collaborate with RN Care Manager to  address care management and care coordination needs  Interventions:  . Received voicemail from patient's daughter, [name], stating that PCS agency was selected but they have not heard back from [facility] regarding start of services. [name] [facility] regarding status of services.  Per representative, [facility] has been selected by patient/caregivers on multiple occassions but they have denied each request due to lack of staffing.  [name] patient's daughters Drenda Freeze and [name] and provided above update.  . Informed daughters that another PCS agency will need to be selected due to lack of staffing at [facility].  They confirmed that they still have list of PCS agencies provided by [facility] . Encouraged daughters to contact selected agency prior to contacting [facility] to ensure request can be accommodated.   Memory Argue to contact PCS agencies on their behalf; daughter stated she would call back if assistance is needed.       Patient Self Care Activities:  . Attends all scheduled provider appointments  Please see past updates related to this goal by clicking on the "Past Updates" button in the selected goal          Telephone follow up appointment with care management team member scheduled for:05/31/19     Malachy Chamber, BSW Embedded Care Coordination Social Worker [facility] [phone]

## 2019-05-26 LAB — ALKALINE PHOSPHATASE, ISOENZYMES
Alk Phos Bone Fract: 31 % (ref 14–68)
Alk Phos Liver Fract: 69 % (ref 18–85)
Alk Phos: 1046 IU/L — ABNORMAL HIGH (ref 39–117)
Intestinal %: 0 % (ref 0–18)

## 2019-05-27 NOTE — Progress Notes (Signed)
Internal Medicine Clinic Resident  I have personally reviewed this encounter including the documentation in this note and/or discussed this patient with the care management provider. I will address any urgent items identified by the care management provider and will communicate my actions to the patient's PCP. I have reviewed the patient's CCM visit with my supervising attending, Dr Raines.  Ismael Treptow K Michelyn Scullin, MD 05/27/2019   

## 2019-05-31 ENCOUNTER — Non-Acute Institutional Stay: Payer: Medicare Other | Admitting: Hospice

## 2019-05-31 ENCOUNTER — Other Ambulatory Visit: Payer: Self-pay

## 2019-05-31 ENCOUNTER — Ambulatory Visit: Payer: Medicare Other

## 2019-05-31 DIAGNOSIS — E785 Hyperlipidemia, unspecified: Secondary | ICD-10-CM

## 2019-05-31 DIAGNOSIS — F0390 Unspecified dementia without behavioral disturbance: Secondary | ICD-10-CM

## 2019-05-31 DIAGNOSIS — Z515 Encounter for palliative care: Secondary | ICD-10-CM

## 2019-05-31 DIAGNOSIS — R7989 Other specified abnormal findings of blood chemistry: Secondary | ICD-10-CM

## 2019-05-31 DIAGNOSIS — I10 Essential (primary) hypertension: Secondary | ICD-10-CM

## 2019-05-31 NOTE — Chronic Care Management (AMB) (Signed)
  Care Management   Follow Up Note   05/31/2019 Name: Jennifer Owen MRN: [id number] DOB: 08-11-1931  Referred by: Bridget Hartshorn, DO Reason for referral : Care Coordination (Placement)   Jennifer Owen is a 86 y.o. year old female who is a primary care patient of Bridget Hartshorn, DO. The care management team was consulted for assistance with care management and care coordination needs.    Review of patient status, including review of consultants reports, relevant laboratory and other test results, and collaboration with appropriate care team members and the patient's provider was performed as part of comprehensive patient evaluation and provision of chronic care management services.    SDOH (Social Determinants of Health) assessments performed: No See Care Plan activities for detailed interventions related to [facility])     Advanced Directives: See Care Plan and Vynca application for related entries.   Goals Addressed            This Visit's Progress   . Per daughter, mom needs around the clock care (pt-stated)       Current Barriers:  Marland Kitchen Knowledge Barriers related to resources to address level of care.  Case Manager Clinical Goal(s):  Marland Kitchen Over the next 120 days, patient will work with BSW to address needs related to level of care concerns; securing placement . Over the next 120 days, BSW will collaborate with RN Care Manager to address care management and care coordination needs  Interventions:   . Talked with patient's daughters, Drenda Freeze and [name], regarding status of SNF placement.  Patient remains at [facility] at this time.  Unfortunately, both daughters report that patient's health is declining as patient is minimally eating and "not taking her medicines like she is supposed to"  Daughters are unsure if patient will remain at [facility] long term but they have worked with Child psychotherapist to locate other options if needed.  Patient's daughter, [name] reports that  [facility] has been very helpful and that staff are taking care of her as best they can.    Patient Self Care Activities:  . Attends all scheduled provider appointments . Acknowledges need for need for assistance and that this is difficult for family members to provide  Please see past updates related to this goal by clicking on the "Past Updates" button in the selected goal          Family has been provided with contact information for the care management team and has been advised to call with any health related questions or concerns.    Malachy Chamber, BSW Embedded Care Coordination Social Worker [facility] [phone]

## 2019-05-31 NOTE — Progress Notes (Signed)
[place] Consult Note Telephone: [phone]  Fax: [phone]  PATIENT NAME: Jennifer Owen DOB: Aug 31, 1931 MRN: [id number]  PRIMARY CARE PROVIDER:   Delice Bison, DO  REFERRING PROVIDER: Dr. Wenda Low  RESPONSIBLE PARTY:  Ruthmary Occhipinti - daughter [phone]    RECOMMENDATIONS/PLAN:   Advance Care Planning/Goals of Care: Visit at the request of Dr. Wenda Low for palliative consult. Visit consisted of building trust and discussions on Palliative Medicine as specialized medical care for people living with serious illness, aimed at facilitating better quality of life through symptoms relief, assisting with advance care plan and establishing goals of care.  Patient with ongoing end-stage dementia, not able to engage in meaningful conversation.  NP called and spoke with daughter Manus Gunning - counseling and education dealing with the complex and emotionally intense issues of symptom management and palliative care in the setting of serious and potentially life-threatening illness.  Manus Gunning affirmed that patient is a DO NOT RESUSCITATE.  She is open to hospice services as soon as it is possible.  NP discussed with director of admissions in the facility the possibility of hospice service for patien considering patient's significant decline in functional status; she will follow up and facility will put in a referral as soon as it is possible.  Palliative care team will continue to support patient, patient's family, and medical team.  DNR form uploaded to epic today Symptom management: Patient was hospitalized 05/15/2019 to 3/53/6144RXV acute metabolic encephalopathy due to dehydration/AKI; improved with intravenous fluid; family not interested in invasive therapy for decreased renal function.  Patient with ongoing memory loss/confusion related to end-stage dementia. Bedbound, increasing weakness and sleepiness, total care, incontinent bowel and  bladder, currently on full liquid diet; refusing to eat in days per nursing staff.  FLACC 0.  Nurse Thurmond Butts with no acute concern.  Encouraged ongoing nursing care. Follow up: Palliative care will continue to follow patient for goals of care clarification and symptom management. I spent 1 hour and 46   minutes providing this consultation; time iincludes time spent with patient/family, chart review, provider coordination,  and documentation. More than 50% of the time in this consultation was spent on coordinating communication  HISTORY OF PRESENT ILLNESS:  AILIE Owen is a 86 y.o. year old female with multiple medical problems including recent metabolic encephalopathy, end-stage dementia, hypertension.. Palliative Care was asked to help address goals of care.   CODE STATUS: DNR  PPS: 20% HOSPICE ELIGIBILITY/DIAGNOSIS: TBD  PAST MEDICAL HISTORY:  Past Medical History:  Diagnosis Date  . GERD (gastroesophageal reflux disease)    Papulous gastropathy EGD Oct. 2007  . Hyperlipemia   . Hypertension   . IBS (irritable bowel syndrome)   . Low back pain    intermittent radiculopathy  . Osteoarthritis   . Osteopenia    T score-1.1 R [place], - 0.4 L spine  . Polio 1942  . Pulmonary embolism (HCC)    2/2 knee replacement  . Urinary incontinence   . UTI (urinary tract infection) 05/16/2019    SOCIAL HX:  Social History   Tobacco Use  . Smoking status: Former Smoker    Quit date: 1945    Years since quitting: 76.4  . Smokeless tobacco: Never Used  Substance Use Topics  . Alcohol use: No    Alcohol/week: 0.0 standard drinks    Comment: Christmas time only    ALLERGIES:  Allergies  Allergen Reactions  . Tramadol Hcl Nausea Only  and Other (See Comments)    Stomach irritation     PERTINENT MEDICATIONS:  Outpatient Encounter Medications as of 05/31/2019  Medication Sig  . acetaminophen (TYLENOL) 500 MG tablet Take 1,000 mg by mouth in the morning.   . diclofenac sodium (VOLTAREN)  1 % GEL Apply 2 g upto 4 times a day to your knees as needed for knee pain. (Patient not taking: Reported on 05/15/2019)  . polyethylene glycol (MIRALAX / GLYCOLAX) 17 g packet Take 17 g by mouth daily.  . QUEtiapine (SEROQUEL) 25 MG tablet Take 2 tablets (50 mg total) by mouth at bedtime.  . ramelteon (ROZEREM) 8 MG tablet Take 1 tablet (8 mg total) by mouth at bedtime.  . [DISCONTINUED] simvastatin (ZOCOR) 20 MG tablet Take 20 mg by mouth every evening.   No facility-administered encounter medications on file as of 05/31/2019.    PHYSICAL EXAM/ROS  General: NAD,frail Cardiovascular: regular rate and rhythm Pulmonary: clear ant fields Abdomen: soft, nontender, + bowel sounds GU: no suprapubic tenderness Extremities: no edema. Skin: no rashes to exposed skin Neurological: Weakness but otherwise nonfocal  Rosaura Carpenter, NP

## 2019-05-31 NOTE — Progress Notes (Signed)
Internal Medicine Clinic Resident  I have personally reviewed this encounter including the documentation in this note and/or discussed this patient with the care management provider. I will address any urgent items identified by the care management provider and will communicate my actions to the patient's PCP. I have reviewed the patient's CCM visit with my supervising attending, Dr Butcher.  Eulla Kochanowski, MD 05/31/2019    

## 2019-05-31 NOTE — Patient Instructions (Signed)
Visit Information  Goals Addressed            This Visit's Progress   . Per daughter, mom needs around the clock care (pt-stated)       Current Barriers:  Marland Kitchen Knowledge Barriers related to resources to address level of care.  Case Manager Clinical Goal(s):  Marland Kitchen Over the next 120 days, patient will work with BSW to address needs related to level of care concerns; securing placement . Over the next 120 days, BSW will collaborate with RN Care Manager to address care management and care coordination needs  Interventions:   . Talked with patient's daughters, Drenda Freeze and [name], regarding status of SNF placement.  Patient remains at [facility] at this time.  Unfortunately, both daughters report that patient's health is declining as patient is minimally eating and "not taking her medicines like she is supposed to"  Daughters are unsure if patient will remain at [facility] long term but they have worked with Child psychotherapist to locate other options if needed.  Patient's daughter, [name] reports that [facility] has been very helpful and that staff are taking care of her as best they can.    Patient Self Care Activities:  . Attends all scheduled provider appointments . Acknowledges need for need for assistance and that this is difficult for family members to provide  Please see past updates related to this goal by clicking on the "Past Updates" button in the selected goal         Family has been provided with contact information for the care management team and has been advised to call with any health related questions or concerns.        Malachy Chamber, BSW Embedded Care Coordination Social Worker [facility] [phone]

## 2019-06-11 DEATH — deceased

## 2019-06-17 ENCOUNTER — Telehealth: Payer: Medicare Other

## 2019-06-17 ENCOUNTER — Ambulatory Visit: Payer: Self-pay

## 2019-06-17 NOTE — Chronic Care Management (AMB) (Signed)
  Care Management   Social Work Note  06/17/2019 Name: ADRAINE BIFFLE MRN: [id number] DOB: 21-Aug-1931  Care plan closure as patient is deceased.      Malachy Chamber, BSW Embedded Care Coordination Social Worker [facility] Center [phone]

## 2019-06-26 ENCOUNTER — Ambulatory Visit: Payer: Medicare Other | Admitting: Gastroenterology
